# Patient Record
Sex: Male | Born: 1955
Health system: Southern US, Community
[De-identification: ages and names within clinical notes are randomized; demographics above are authoritative.]

## PROBLEM LIST (undated history)

## (undated) ENCOUNTER — Ambulatory Visit (HOSPITAL_COMMUNITY): Disposition: A | Discharge status: Home / Self Care | Payer: PPO

## (undated) DIAGNOSIS — E785 Hyperlipidemia, unspecified: Secondary | ICD-10-CM

## (undated) DIAGNOSIS — H269 Unspecified cataract: Secondary | ICD-10-CM

## (undated) DIAGNOSIS — I10 Essential (primary) hypertension: Secondary | ICD-10-CM

## (undated) DIAGNOSIS — I2510 Atherosclerotic heart disease of native coronary artery without angina pectoris: Secondary | ICD-10-CM

## (undated) DIAGNOSIS — I255 Ischemic cardiomyopathy: Secondary | ICD-10-CM

## (undated) DIAGNOSIS — E079 Disorder of thyroid, unspecified: Secondary | ICD-10-CM

## (undated) DIAGNOSIS — R0602 Shortness of breath: Secondary | ICD-10-CM

## (undated) DIAGNOSIS — I219 Acute myocardial infarction, unspecified: Secondary | ICD-10-CM

## (undated) DIAGNOSIS — M109 Gout, unspecified: Secondary | ICD-10-CM

## (undated) HISTORY — DX: Hyperlipidemia, unspecified: E78.5

## (undated) HISTORY — DX: Disorder of thyroid, unspecified: E07.9

## (undated) HISTORY — DX: Unspecified cataract: H26.9

## (undated) HISTORY — PX: EYE SURGERY: SHX253

---

## 2003-12-15 ENCOUNTER — Emergency Department (HOSPITAL_COMMUNITY): Admission: EM | Admit: 2003-12-15 | Discharge: 2003-12-15 | Payer: Self-pay | Admitting: Emergency Medicine

## 2005-04-06 ENCOUNTER — Ambulatory Visit: Payer: Self-pay | Admitting: Family Medicine

## 2005-07-10 ENCOUNTER — Ambulatory Visit: Payer: Self-pay | Admitting: Family Medicine

## 2005-11-30 ENCOUNTER — Ambulatory Visit: Payer: Self-pay | Admitting: Family Medicine

## 2012-03-31 ENCOUNTER — Emergency Department (HOSPITAL_COMMUNITY): Payer: Self-pay

## 2012-03-31 ENCOUNTER — Encounter (HOSPITAL_COMMUNITY): Payer: Self-pay

## 2012-03-31 ENCOUNTER — Emergency Department (HOSPITAL_COMMUNITY)
Admission: EM | Admit: 2012-03-31 | Discharge: 2012-03-31 | Disposition: A | Discharge status: Home / Self Care | Payer: Self-pay | Attending: Emergency Medicine | Admitting: Emergency Medicine

## 2012-03-31 DIAGNOSIS — S01309A Unspecified open wound of unspecified ear, initial encounter: Secondary | ICD-10-CM | POA: Insufficient documentation

## 2012-03-31 DIAGNOSIS — E119 Type 2 diabetes mellitus without complications: Secondary | ICD-10-CM | POA: Insufficient documentation

## 2012-03-31 DIAGNOSIS — M5030 Other cervical disc degeneration, unspecified cervical region: Secondary | ICD-10-CM | POA: Insufficient documentation

## 2012-03-31 DIAGNOSIS — R220 Localized swelling, mass and lump, head: Secondary | ICD-10-CM | POA: Insufficient documentation

## 2012-03-31 DIAGNOSIS — S301XXA Contusion of abdominal wall, initial encounter: Secondary | ICD-10-CM | POA: Insufficient documentation

## 2012-03-31 DIAGNOSIS — T07XXXA Unspecified multiple injuries, initial encounter: Secondary | ICD-10-CM | POA: Insufficient documentation

## 2012-03-31 DIAGNOSIS — R221 Localized swelling, mass and lump, neck: Secondary | ICD-10-CM | POA: Insufficient documentation

## 2012-03-31 HISTORY — DX: Essential (primary) hypertension: I10

## 2012-03-31 LAB — CBC
Hemoglobin: 14.5 g/dL (ref 13.0–17.0)
MCV: 89.3 fL (ref 78.0–100.0)
Platelets: 220 10*3/uL (ref 150–400)
RBC: 4.67 MIL/uL (ref 4.22–5.81)
WBC: 6 10*3/uL (ref 4.0–10.5)

## 2012-03-31 LAB — COMPREHENSIVE METABOLIC PANEL
Albumin: 3.9 g/dL (ref 3.5–5.2)
BUN: 8 mg/dL (ref 6–23)
Calcium: 8.4 mg/dL (ref 8.4–10.5)
Chloride: 102 mEq/L (ref 96–112)
Creatinine, Ser: 0.93 mg/dL (ref 0.50–1.35)
Total Bilirubin: 0.2 mg/dL — ABNORMAL LOW (ref 0.3–1.2)
Total Protein: 7.5 g/dL (ref 6.0–8.3)

## 2012-03-31 LAB — SAMPLE TO BLOOD BANK

## 2012-03-31 LAB — URINALYSIS, MICROSCOPIC ONLY
Bilirubin Urine: NEGATIVE
Glucose, UA: 250 mg/dL — AB
Leukocytes, UA: NEGATIVE
Nitrite: NEGATIVE
Specific Gravity, Urine: 1.011 (ref 1.005–1.030)
pH: 5 (ref 5.0–8.0)

## 2012-03-31 LAB — POCT I-STAT, CHEM 8
HCT: 45 % (ref 39.0–52.0)
Hemoglobin: 15.3 g/dL (ref 13.0–17.0)
Potassium: 3.5 mEq/L (ref 3.5–5.1)
Sodium: 141 mEq/L (ref 135–145)

## 2012-03-31 LAB — CDS SEROLOGY

## 2012-03-31 MED ORDER — IOHEXOL 300 MG/ML  SOLN
80.0000 mL | Freq: Once | INTRAMUSCULAR | Status: AC | PRN
  Administered 2012-03-31: 80 mL via INTRAVENOUS

## 2012-03-31 MED ORDER — MORPHINE SULFATE 4 MG/ML IJ SOLN
4.0000 mg | Freq: Once | INTRAMUSCULAR | Status: AC
  Administered 2012-03-31: 4 mg via INTRAVENOUS
  Filled 2012-03-31: qty 1

## 2012-03-31 MED ORDER — SODIUM CHLORIDE 0.9 % IV SOLN
Freq: Once | INTRAVENOUS | Status: AC
  Administered 2012-03-31: 13:00:00 via INTRAVENOUS

## 2012-03-31 MED ORDER — LIDOCAINE HCL 2 % IJ SOLN
INTRAMUSCULAR | Status: AC
  Filled 2012-03-31: qty 1

## 2012-03-31 MED ORDER — TETANUS-DIPHTH-ACELL PERTUSSIS 5-2.5-18.5 LF-MCG/0.5 IM SUSP
0.5000 mL | Freq: Once | INTRAMUSCULAR | Status: AC
  Administered 2012-03-31: 0.5 mL via INTRAMUSCULAR

## 2012-03-31 MED ORDER — TETANUS-DIPHTHERIA TOXOIDS TD 5-2 LFU IM INJ
0.5000 mL | INJECTION | Freq: Once | INTRAMUSCULAR | Status: DC
  Filled 2012-03-31: qty 0.5
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
## 2012-03-31 NOTE — ED Notes (Signed)
Pt sent home with wife

## 2012-03-31 NOTE — ED Notes (Signed)
Social Manufacturing systems engineer at bedside to talk with pt

## 2012-03-31 NOTE — Discharge Instructions (Signed)
Do not remove the bandage for 2 days. Go to the [facility] urgent care Center in 2 days to get your wound check. Sutures to come out in one week. Take Tylenol for pain as directed . Avoid alcohol. Return if your condition worsens for any reason
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
## 2012-03-31 NOTE — ED Notes (Signed)
CSI at bedside.

## 2013-01-09 ENCOUNTER — Encounter (HOSPITAL_COMMUNITY): Payer: Self-pay | Admitting: Emergency Medicine

## 2013-01-09 ENCOUNTER — Encounter (HOSPITAL_COMMUNITY)
Admission: EM | Disposition: A | Discharge status: Home Health | Payer: Self-pay | Source: Home / Self Care | Attending: Cardiology

## 2013-01-09 ENCOUNTER — Inpatient Hospital Stay (HOSPITAL_COMMUNITY)
Admission: EM | Admit: 2013-01-09 | Discharge: 2013-01-15 | DRG: 247 | Disposition: A | Discharge status: Home Health | Payer: Medicaid Other | Attending: Cardiology | Admitting: Cardiology

## 2013-01-09 DIAGNOSIS — F10929 Alcohol use, unspecified with intoxication, unspecified: Secondary | ICD-10-CM

## 2013-01-09 DIAGNOSIS — I219 Acute myocardial infarction, unspecified: Secondary | ICD-10-CM

## 2013-01-09 DIAGNOSIS — I2589 Other forms of chronic ischemic heart disease: Secondary | ICD-10-CM | POA: Diagnosis present

## 2013-01-09 DIAGNOSIS — F1010 Alcohol abuse, uncomplicated: Secondary | ICD-10-CM | POA: Diagnosis present

## 2013-01-09 DIAGNOSIS — I2119 ST elevation (STEMI) myocardial infarction involving other coronary artery of inferior wall: Secondary | ICD-10-CM

## 2013-01-09 DIAGNOSIS — Z79899 Other long term (current) drug therapy: Secondary | ICD-10-CM

## 2013-01-09 DIAGNOSIS — I498 Other specified cardiac arrhythmias: Secondary | ICD-10-CM | POA: Diagnosis present

## 2013-01-09 DIAGNOSIS — I213 ST elevation (STEMI) myocardial infarction of unspecified site: Secondary | ICD-10-CM

## 2013-01-09 DIAGNOSIS — I255 Ischemic cardiomyopathy: Secondary | ICD-10-CM

## 2013-01-09 DIAGNOSIS — Z955 Presence of coronary angioplasty implant and graft: Secondary | ICD-10-CM

## 2013-01-09 DIAGNOSIS — E119 Type 2 diabetes mellitus without complications: Secondary | ICD-10-CM | POA: Diagnosis present

## 2013-01-09 DIAGNOSIS — E876 Hypokalemia: Secondary | ICD-10-CM | POA: Diagnosis not present

## 2013-01-09 DIAGNOSIS — I2510 Atherosclerotic heart disease of native coronary artery without angina pectoris: Secondary | ICD-10-CM

## 2013-01-09 DIAGNOSIS — E1165 Type 2 diabetes mellitus with hyperglycemia: Secondary | ICD-10-CM

## 2013-01-09 DIAGNOSIS — R112 Nausea with vomiting, unspecified: Secondary | ICD-10-CM | POA: Diagnosis present

## 2013-01-09 DIAGNOSIS — I10 Essential (primary) hypertension: Secondary | ICD-10-CM | POA: Diagnosis present

## 2013-01-09 DIAGNOSIS — E1159 Type 2 diabetes mellitus with other circulatory complications: Secondary | ICD-10-CM | POA: Diagnosis present

## 2013-01-09 HISTORY — DX: Ischemic cardiomyopathy: I25.5

## 2013-01-09 HISTORY — DX: Atherosclerotic heart disease of native coronary artery without angina pectoris: I25.10

## 2013-01-09 HISTORY — DX: Acute myocardial infarction, unspecified: I21.9

## 2013-01-09 HISTORY — PX: LEFT HEART CATHETERIZATION WITH CORONARY ANGIOGRAM: SHX5451

## 2013-01-09 HISTORY — PX: CORONARY ANGIOPLASTY WITH STENT PLACEMENT: SHX49

## 2013-01-09 LAB — RAPID URINE DRUG SCREEN, HOSP PERFORMED
Cocaine: NOT DETECTED
Opiates: POSITIVE — AB

## 2013-01-09 LAB — TROPONIN I: Troponin I: >20 ng/mL (ref <0.30)

## 2013-01-09 LAB — CBC WITH DIFFERENTIAL/PLATELET
Basophils Absolute: 0 10*3/uL (ref 0.0–0.1)
Basophils Relative: 0 % (ref 0–1)
HCT: 35.6 % — ABNORMAL LOW (ref 39.0–52.0)
MCHC: 36 g/dL (ref 30.0–36.0)
Monocytes Absolute: 0.5 10*3/uL (ref 0.1–1.0)
Neutro Abs: 10.3 10*3/uL — ABNORMAL HIGH (ref 1.7–7.7)
RDW: 12 % (ref 11.5–15.5)

## 2013-01-09 LAB — MRSA PCR SCREENING: MRSA by PCR: NEGATIVE

## 2013-01-09 LAB — COMPREHENSIVE METABOLIC PANEL
ALT: 42 U/L (ref 0–53)
AST: 247 U/L — ABNORMAL HIGH (ref 0–37)
Alkaline Phosphatase: 88 U/L (ref 39–117)
CO2: 19 mEq/L (ref 19–32)
Calcium: 8.5 mg/dL (ref 8.4–10.5)
Chloride: 99 mEq/L (ref 96–112)
GFR calc Af Amer: >90 mL/min (ref >90)
GFR calc non Af Amer: >90 mL/min (ref >90)
Glucose, Bld: 351 mg/dL — ABNORMAL HIGH (ref 70–99)
Potassium: 3.9 mEq/L (ref 3.5–5.1)
Sodium: 134 mEq/L — ABNORMAL LOW (ref 135–145)
Total Bilirubin: 0.6 mg/dL (ref 0.3–1.2)

## 2013-01-09 LAB — POCT I-STAT, CHEM 8
Chloride: 103 mEq/L (ref 96–112)
HCT: 38 % — ABNORMAL LOW (ref 39.0–52.0)
Potassium: 3.8 mEq/L (ref 3.5–5.1)

## 2013-01-09 LAB — MAGNESIUM: Magnesium: 2 mg/dL (ref 1.5–2.5)

## 2013-01-09 LAB — APTT: aPTT: 50 seconds — ABNORMAL HIGH (ref 24–37)

## 2013-01-09 LAB — POCT ACTIVATED CLOTTING TIME: Activated Clotting Time: 306 seconds

## 2013-01-09 SURGERY — LEFT HEART CATHETERIZATION WITH CORONARY ANGIOGRAM
Anesthesia: LOCAL

## 2013-01-09 MED ORDER — BIVALIRUDIN 250 MG IV SOLR
INTRAVENOUS | Status: AC
  Filled 2013-01-09: qty 250

## 2013-01-09 MED ORDER — ACETAMINOPHEN 325 MG PO TABS: 650.0000 mg | ORAL_TABLET | ORAL | Status: DC | PRN

## 2013-01-09 MED ORDER — ONDANSETRON HCL 4 MG/2ML IJ SOLN
4.0000 mg | INTRAMUSCULAR | Status: DC | PRN
  Administered 2013-01-10: 4 mg via INTRAVENOUS
  Filled 2013-01-09: qty 2

## 2013-01-09 MED ORDER — DOPAMINE-DEXTROSE 3.2-5 MG/ML-% IV SOLN
INTRAVENOUS | Status: AC
  Administered 2013-01-09: 5 ug/kg/min via INTRAVENOUS
  Filled 2013-01-09: qty 250

## 2013-01-09 MED ORDER — SODIUM CHLORIDE 0.9 % IJ SOLN
3.0000 mL | Freq: Two times a day (BID) | INTRAMUSCULAR | Status: DC
  Administered 2013-01-09 – 2013-01-14 (×11): 3 mL via INTRAVENOUS

## 2013-01-09 MED ORDER — DEXTROSE-NACL 5-0.45 % IV SOLN: INTRAVENOUS | Status: DC

## 2013-01-09 MED ORDER — TICAGRELOR 90 MG PO TABS
90.0000 mg | ORAL_TABLET | Freq: Two times a day (BID) | ORAL | Status: DC
  Administered 2013-01-09: 90 mg via ORAL
  Filled 2013-01-09 (×2): qty 1

## 2013-01-09 MED ORDER — PANTOPRAZOLE SODIUM 40 MG IV SOLR
40.0000 mg | Freq: Once | INTRAVENOUS | Status: AC
  Administered 2013-01-09: 40 mg via INTRAVENOUS
  Filled 2013-01-09 (×2): qty 40

## 2013-01-09 MED ORDER — SODIUM CHLORIDE 0.9 % IV SOLN: INTRAVENOUS | Status: DC

## 2013-01-09 MED ORDER — ONDANSETRON HCL 4 MG/2ML IJ SOLN
4.0000 mg | Freq: Four times a day (QID) | INTRAMUSCULAR | Status: DC | PRN
  Administered 2013-01-09: 4 mg via INTRAVENOUS
  Filled 2013-01-09: qty 2

## 2013-01-09 MED ORDER — LORAZEPAM 1 MG PO TABS
1.0000 mg | ORAL_TABLET | Freq: Four times a day (QID) | ORAL | Status: AC | PRN
  Administered 2013-01-11: 1 mg via ORAL
  Filled 2013-01-09: qty 1

## 2013-01-09 MED ORDER — SODIUM CHLORIDE 0.9 % IV SOLN: 250.0000 mL | INTRAVENOUS | Status: DC | PRN

## 2013-01-09 MED ORDER — HEPARIN SODIUM (PORCINE) 5000 UNIT/ML IJ SOLN
5000.0000 [IU] | Freq: Three times a day (TID) | INTRAMUSCULAR | Status: DC
  Administered 2013-01-10 – 2013-01-15 (×15): 5000 [IU] via SUBCUTANEOUS
  Filled 2013-01-09 (×12): qty 1

## 2013-01-09 MED ORDER — INSULIN ASPART 100 UNIT/ML ~~LOC~~ SOLN: 0.0000 [IU] | Freq: Three times a day (TID) | SUBCUTANEOUS | Status: DC

## 2013-01-09 MED ORDER — ATORVASTATIN CALCIUM 80 MG PO TABS
80.0000 mg | ORAL_TABLET | Freq: Every day | ORAL | Status: DC
  Administered 2013-01-10 – 2013-01-14 (×5): 80 mg via ORAL
  Filled 2013-01-09 (×6): qty 1

## 2013-01-09 MED ORDER — HEPARIN (PORCINE) IN NACL 2-0.9 UNIT/ML-% IJ SOLN
INTRAMUSCULAR | Status: AC
  Filled 2013-01-09: qty 1000

## 2013-01-09 MED ORDER — INSULIN REGULAR BOLUS VIA INFUSION
0.0000 [IU] | Freq: Three times a day (TID) | INTRAVENOUS | Status: DC
  Filled 2013-01-09: qty 10

## 2013-01-09 MED ORDER — SODIUM CHLORIDE 0.9 % IV SOLN
INTRAVENOUS | Status: DC
  Administered 2013-01-10: 19:00:00 via INTRAVENOUS

## 2013-01-09 MED ORDER — ADULT MULTIVITAMIN W/MINERALS CH
1.0000 | ORAL_TABLET | Freq: Every day | ORAL | Status: DC
  Administered 2013-01-09 – 2013-01-13 (×5): 1 via ORAL
  Filled 2013-01-09 (×5): qty 1

## 2013-01-09 MED ORDER — VERAPAMIL HCL 2.5 MG/ML IV SOLN
INTRAVENOUS | Status: AC
  Filled 2013-01-09: qty 2

## 2013-01-09 MED ORDER — FOLIC ACID 1 MG PO TABS
1.0000 mg | ORAL_TABLET | Freq: Every day | ORAL | Status: DC
  Administered 2013-01-09 – 2013-01-13 (×5): 1 mg via ORAL
  Filled 2013-01-09 (×5): qty 1

## 2013-01-09 MED ORDER — LIDOCAINE HCL (PF) 1 % IJ SOLN
INTRAMUSCULAR | Status: AC
  Filled 2013-01-09: qty 30

## 2013-01-09 MED ORDER — MORPHINE SULFATE 4 MG/ML IJ SOLN
4.0000 mg | INTRAMUSCULAR | Status: DC | PRN
  Administered 2013-01-09 – 2013-01-10 (×2): 4 mg via INTRAVENOUS
  Administered 2013-01-11: 2 mg via INTRAVENOUS
  Filled 2013-01-09 (×4): qty 1

## 2013-01-09 MED ORDER — SODIUM CHLORIDE 0.9 % IV SOLN
INTRAVENOUS | Status: AC
  Administered 2013-01-09: 19:00:00 via INTRAVENOUS
  Administered 2013-01-10: 125 mL/h via INTRAVENOUS

## 2013-01-09 MED ORDER — SODIUM CHLORIDE 0.9 % IV BOLUS (SEPSIS)
500.0000 mL | Freq: Once | INTRAVENOUS | Status: AC
  Administered 2013-01-09: 500 mL via INTRAVENOUS

## 2013-01-09 MED ORDER — FENTANYL CITRATE 0.05 MG/ML IJ SOLN
INTRAMUSCULAR | Status: AC
  Filled 2013-01-09: qty 2

## 2013-01-09 MED ORDER — VITAMIN B-1 100 MG PO TABS
100.0000 mg | ORAL_TABLET | Freq: Every day | ORAL | Status: DC
  Administered 2013-01-09 – 2013-01-13 (×4): 100 mg via ORAL
  Filled 2013-01-09 (×5): qty 1

## 2013-01-09 MED ORDER — MIDAZOLAM HCL 2 MG/2ML IJ SOLN
INTRAMUSCULAR | Status: AC
  Filled 2013-01-09: qty 2

## 2013-01-09 MED ORDER — ASPIRIN EC 81 MG PO TBEC
81.0000 mg | DELAYED_RELEASE_TABLET | Freq: Every day | ORAL | Status: DC
  Administered 2013-01-10 – 2013-01-15 (×6): 81 mg via ORAL
  Filled 2013-01-09 (×6): qty 1

## 2013-01-09 MED ORDER — TICAGRELOR 90 MG PO TABS
ORAL_TABLET | ORAL | Status: AC
  Filled 2013-01-09: qty 2

## 2013-01-09 MED ORDER — NITROGLYCERIN 1 MG/10 ML FOR IR/CATH LAB
INTRA_ARTERIAL | Status: AC
  Filled 2013-01-09: qty 10

## 2013-01-09 MED ORDER — DOPAMINE-DEXTROSE 3.2-5 MG/ML-% IV SOLN
2.0000 ug/kg/min | INTRAVENOUS | Status: DC
  Administered 2013-01-11: 4 ug/kg/min via INTRAVENOUS
  Filled 2013-01-09: qty 250

## 2013-01-09 MED ORDER — LORAZEPAM 2 MG/ML IJ SOLN: 1.0000 mg | Freq: Four times a day (QID) | INTRAMUSCULAR | Status: AC | PRN

## 2013-01-09 MED ORDER — DEXTROSE 50 % IV SOLN: 25.0000 mL | INTRAVENOUS | Status: DC | PRN

## 2013-01-09 MED ORDER — NITROGLYCERIN 0.4 MG SL SUBL
0.4000 mg | SUBLINGUAL_TABLET | SUBLINGUAL | Status: DC | PRN
  Administered 2013-01-09: 0.4 mg via SUBLINGUAL
  Filled 2013-01-09: qty 50
  Filled 2013-01-09: qty 25

## 2013-01-09 MED ORDER — ZOLPIDEM TARTRATE 5 MG PO TABS: 5.0000 mg | ORAL_TABLET | Freq: Every evening | ORAL | Status: DC | PRN

## 2013-01-09 MED ORDER — INSULIN REGULAR HUMAN 100 UNIT/ML IJ SOLN
INTRAMUSCULAR | Status: DC
  Administered 2013-01-09: 3 [IU]/h via INTRAVENOUS
  Administered 2013-01-10: 1 [IU]/h via INTRAVENOUS
  Filled 2013-01-09: qty 1

## 2013-01-09 MED ORDER — SODIUM CHLORIDE 0.9 % IJ SOLN
3.0000 mL | INTRAMUSCULAR | Status: DC | PRN
  Administered 2013-01-12: 3 mL via INTRAVENOUS

## 2013-01-09 MED ORDER — ASPIRIN 81 MG PO CHEW: 324.0000 mg | CHEWABLE_TABLET | Freq: Once | ORAL | Status: DC

## 2013-01-09 MED ORDER — THIAMINE HCL 100 MG/ML IJ SOLN
100.0000 mg | Freq: Every day | INTRAMUSCULAR | Status: DC
  Administered 2013-01-10: 100 mg via INTRAVENOUS
  Filled 2013-01-09 (×5): qty 1

## 2013-01-09 NOTE — Progress Notes (Signed)
Pt remains 78/57 Dr [place] notified. Pt speaks little english but appears to have proper mentation. Order for dopamine received. Report given to [facility].

## 2013-01-09 NOTE — ED Notes (Signed)
Pt CP started last night radiating to left arm, neck, jaw, with n/v, dizziness that continued today. BIB EMS.

## 2013-01-09 NOTE — CV Procedure (Addendum)
Cardiac Catheterization Procedure Note  Name: Ethan Peters MRN: [id number] DOB: 1956-06-28  Procedure: Left Heart Cath, Selective Coronary Angiography, LV angiography, PTCA and stenting of the RCA  Indication: 56 year old Hispanic male with history of diabetes and hypertension presents with acute onset of chest pain beginning yesterday and it intensified today. Initial ECG via EMS was nondiagnostic. Repeat ECG in our emergency department showed 1 mm of ST segment elevation in leads 2, 3, and aVF consistent with an inferior ST elevation myocardial infarction. Emergent cardiac catheterization was recommended.  Procedural Details:  The right wrist was prepped, draped, and anesthetized with 1% lidocaine. Using the modified Seldinger technique, a 6 French sheath was introduced into the right radial artery. 3 mg of verapamil was administered through the sheath, weight-based unfractionated heparin was administered intravenously. Standard Judkins catheters were used for selective coronary angiography and left ventriculography. Catheter exchanges were performed over an exchange length guidewire.  PROCEDURAL FINDINGS Hemodynamics: AO 80/57 with a mean of 68 mmHg LV 80/22 mmHg   Coronary angiography: Coronary dominance: right  Left mainstem: Left main coronary is very short without significant disease.  Left anterior descending (LAD): The left anterior descending artery has a 30% stenosis in the proximal vessel. There is also 30% stenosis in the mid vessel. The first diagonal is without significant disease.  Left circumflex (LCx): Left circumflex gives rise to a single marginal branch. There is segmental 70% stenosis in the proximal marginal branch.  Right coronary artery (RCA): The right coronary is a dominant vessel. It is occluded at the crux. There are left to right collaterals to the distal RCA.  Left ventriculography: Left ventricular angiography performed at the end of the procedure  demonstrates moderate to severe hypokinesis of the inferior basal wall. Overall ejection fraction is estimated at 50% there is no significant mitral insufficiency.  PCI Note:  Following the diagnostic procedure, the decision was made to proceed with PCI of the RCA. Brilinta 180 mg was given orally. Weight-based bivalirudin was given for anticoagulation. Once a therapeutic ACT was achieved, a 6 [place] FR4 guide catheter was inserted.  A pro-water coronary guidewire was used to cross the lesion.  The lesion was predilated with a 2.5 mm balloon. With this the vessel reperfused. There was a long thrombus noted in the distal RCA. We used an expressway extraction catheter and removed a long thrombus. By angiography there was no residual thrombus in the vessel. The lesion was then stented with a 2.5 x 16 mm Promus Premier stent.  The stent was postdilated with a 2.75 mm noncompliant balloon.  Following PCI, there was 0% residual stenosis and TIMI-3 flow. Final angiography confirmed an excellent result. The patient tolerated the procedure well. There were no immediate procedural complications. A TR band was used for radial hemostasis. The patient was transferred to the post catheterization recovery area for further monitoring.  PCI Data: Vessel - RCA/Segment - crux Percent Stenosis (pre)  100% TIMI-flow 0 Stent 2.5 x 16 mm Promus Premier Percent Stenosis (post) 0% TIMI-flow (post) 3  Final Conclusions:   1. 2 vessel obstructive coronary disease. The right coronary was the culprit vessel and was occluded. There is moderate disease in the first obtuse marginal vessel. 2. Mild left ventricular dysfunction. 3. Successful stenting of the crux of the right coronary with a drug-eluting stent.   Recommendations:  Continue dual antiplatelet therapy for one year. Risk factor modification. I would treat the marginal disease medically.  [name] [facility] 01/09/2013, 6:42 PM

## 2013-01-09 NOTE — ED Provider Notes (Signed)
History     CSN: [id number]  Arrival date & time 01/09/13  1714   First MD Initiated Contact with Patient 01/09/13 1721      Chief Complaint  Patient presents with  . Chest Pain    (Consider location/radiation/quality/duration/timing/severity/associated sxs/prior treatment) Patient is a 57 y.o. male presenting with chest pain. The history is provided by the patient.  Chest Pain Pain location:  Substernal area Pain quality: dull   Pain severity:  Moderate (5) Onset quality:  Gradual Timing:  Constant Progression:  Unchanged Chronicity:  New Context comment:  Started last night and has persisted Relieved by:  Nothing   Past Medical History  Diagnosis Date  . Hypertension   . Diabetes mellitus     No past surgical history on file.  No family history on file.  History  Substance Use Topics  . Smoking status: Not on file  . Smokeless tobacco: Not on file  . Alcohol Use: Yes     Comment: pt intoxicated at time of arrival. EMS reports patient has been drinking for the past 2 days.       Review of Systems  Unable to perform ROS: Acuity of condition  Cardiovascular: Positive for chest pain.    Allergies  Penicillins  Home Medications   Current Outpatient Rx  Name  Route  Sig  Dispense  Refill  . glyBURIDE (DIABETA) 5 MG tablet   Oral   Take 5 mg by mouth daily with breakfast.         . lisinopril-hydrochlorothiazide (PRINZIDE,ZESTORETIC) 10-12.5 MG per tablet   Oral   Take 1 tablet by mouth daily.         . metFORMIN (GLUCOPHAGE-XR) 500 MG 24 hr tablet   Oral   Take 1,000 mg by mouth daily with breakfast.           BP 101/56  Temp(Src) 98.3 F (36.8 C) (Oral)  Resp 17  SpO2 100%  Physical Exam  Nursing note and vitals reviewed. Constitutional: He is oriented to person, place, and time. He appears well-developed and well-nourished. No distress.  HENT:  Head: Normocephalic and atraumatic.  Mouth/Throat: Oropharynx is clear and moist.   Eyes: Conjunctivae are normal. Pupils are equal, round, and reactive to light. No scleral icterus.  Neck: Normal range of motion. Neck supple.  Cardiovascular: Normal rate, regular rhythm, normal heart sounds and intact distal pulses.   No murmur heard. Pulmonary/Chest: Effort normal and breath sounds normal. No stridor. No respiratory distress. He has no wheezes. He has no rales.  Abdominal: Soft. He exhibits no distension. There is no tenderness. There is no rebound and no guarding.  Musculoskeletal: Normal range of motion. He exhibits no edema.  Neurological: He is alert and oriented to person, place, and time.  Skin: Skin is warm and dry. No rash noted.  Psychiatric: He has a normal mood and affect. His behavior is normal.    ED Course  Procedures (including critical care time)  Labs Reviewed  APTT  CBC  COMPREHENSIVE METABOLIC PANEL  PROTIME-INR   No results found.   Date: 01/09/2013  Rate: 59  Rhythm: sinus bradycardia  QRS Axis: normal  Intervals: normal  ST/T Wave abnormalities: ST elevations inferiorly and ST elevations laterally  Conduction Disutrbances:none  Narrative Interpretation:   Old EKG Reviewed: none available   1. STEMI (ST elevation myocardial infarction)       MDM   57 yo male presenting with chest pain which started last night and has  worsened today.  EMS called code STEMI PTA.  EKG from EMS felt to be equivocal by STEMI cardiologist.  However, initial ED EKG concerning for inferior STEMI.  Cardiologist reviewed this EKG as well and agreed that he needed to be taken emergently to the cath lab.        Rennis Petty, MD 01/10/13 [phone]

## 2013-01-09 NOTE — Progress Notes (Signed)
CRITICAL VALUE ALERT  Critical value received: Troponin > 20.0   Date of notification:  01/09/2013  Time of notification:  2243  Critical value read back:yes  Nurse who received alert:  [id number]   MD notified (1st page):  Dr. Orvis Brill   Time of first page:  2245  MD notified (2nd page):  Time of second page:  Responding MD: Dr Orvis Brill Time MD responded:2320

## 2013-01-09 NOTE — H&P (Signed)
History and Physical   Patient ID: Ethan Peters MRN: [id number], DOB/AGE: [birth year]   Admit date: 01/09/2013 Date of Consult: 01/09/2013   Primary Physician: Default, Provider, MD Primary Cardiologist: New  HPI: Ethan Peters is a 57 y.o. Spanish-speaking male with minimal known PMHx including DM2 and HTN who presented to [facility] ED today complaining of chest pain.   He described his discomfort as substernal, dull, constant at 5/10 occurring last night and persisting today. The pain persisted and EMS was called. Reviewing the ED notes, apparently was intoxicated on arrival and had been drinking for the past two days. The EKG on initial review was non-diagnostic for STEMI. Repeat EKG in the ED did reveal significant inferior ST elevations consistent with STEMI. Code STEMI was activated, and the patient was transported emergently to the cath lab.  Patient does speak some [place].   Problem List: Past Medical History  Diagnosis Date  . Hypertension   . Diabetes mellitus    Social History - Limited by language barrier and acuity of situation  Family History - Limited by language barrier and acuity of situation  Allergies:  Allergies  Allergen Reactions  . Penicillins Rash    Home Medications: Prior to Admission medications   Medication Sig Start Date End Date Taking? Authorizing Provider  glyBURIDE (DIABETA) 5 MG tablet Take 5 mg by mouth daily with breakfast.    Historical Provider, MD  lisinopril-hydrochlorothiazide (PRINZIDE,ZESTORETIC) 10-12.5 MG per tablet Take 1 tablet by mouth daily.    Historical Provider, MD  metFORMIN (GLUCOPHAGE-XR) 500 MG 24 hr tablet Take 1,000 mg by mouth daily with breakfast.    Historical Provider, MD    Inpatient Medications:  . [facility] HOLD] aspirin  324 mg Oral Once   Prescriptions prior to admission  Medication Sig Dispense Refill  . glyBURIDE (DIABETA) 5 MG tablet Take 5 mg by mouth daily with breakfast.      .  lisinopril-hydrochlorothiazide (PRINZIDE,ZESTORETIC) 10-12.5 MG per tablet Take 1 tablet by mouth daily.      . metFORMIN (GLUCOPHAGE-XR) 500 MG 24 hr tablet Take 1,000 mg by mouth daily with breakfast.        History   Social History  . Marital Status: Married    Spouse Name: N/A    Number of Children: N/A  . Years of Education: N/A   Occupational History  . Not on file.   Social History Main Topics  . Smoking status: Not on file  . Smokeless tobacco: Not on file  . Alcohol Use: Yes     Comment: pt intoxicated at time of arrival. EMS reports patient has been drinking for the past 2 days.   . Drug Use:   . Sexually Active:    Other Topics Concern  . Not on file   Social History Narrative  . No narrative on file     Review of Systems:  Full ROS limited by language barrier and acuity of situation.   Cardiovascular:  positive for chest pain  Physical Exam: Blood pressure 101/56, temperature 98.3 F (36.8 C), temperature source Oral, resp. rate 17, SpO2 100.00%.  General: Well developed, well nourished, in no acute distress. Head: Normocephalic, atraumatic, sclera non-icteric, no xanthomas, nares are without discharge.  Neck: Negative for carotid bruits. JVD not elevated. Lungs: Clear bilaterally to auscultation without wheezes, rales, or rhonchi. Breathing is unlabored. Heart:  RRR with S1 S2. No murmurs, rubs, or gallops appreciated. Abdomen: Soft, non-tender, non-distended with normoactive bowel sounds. No  hepatomegaly. No rebound/guarding. No obvious abdominal masses. Msk:  Strength and tone appears normal for age. Extremities: No clubbing, cyanosis or edema.  Distal pedal pulses are 2+ and equal bilaterally. Neuro: Alert and oriented X 3. Moves all extremities spontaneously. Psych:  Responds to questions appropriately with a normal affect.  Labs:  Pending  Radiology/Studies: No results found.  EKG: NSR/sinus bradycardia, 59 bpm, ST elevations II, III, aVF (>2  mm) with small Qs  ASSESSMENT AND PLAN:   57 y.o. Spanish-speaking male with minimal known PMHx including DM2 and HTN who presented to [facility] ED today complaining of chest pain.   1. Inferior STEMI 2. Type 2 DM 3. Hypertension 4. EtOH excess  Currently undergoing emergent cardiac catheterization. Will need at least low-dose ASA. Will start high-dose atorvastatin after discussing with MD. Check EtOH, UDS, LFTs. Place on CIWA. BP a bit labile in the ED. Will defer on BB and OP antihypertensives initially- requiring IV boluses on the cath table. Switch oral hypoglycemics for SSI. Cycle CEs, check BMET, CBC, TSH, Mg, A1C, lipids, CXR. Will be transferred to CCU post-cath. Further recommendations to be determined by the interventionalist's findings.    Signed, R. Hurman Horn, PA-C 01/09/2013, 6:13 PM   Patient seen and examined and history reviewed. Agree with above findings and plan. 57 yo hispanic male presents with chest pain that began yesterday but intensified today. Associated with nausea and vomiting. Pain radiated to back and left arm. On arrival pain was 5/10. Exam is benign. Initial Ecg by EMS was nondiagnostic. Repeat Ecg in our ED shows 1 mm ST elevation in leads 2.3.avf consistent with inferior STEMI. Emergent cardiac cath recommended. Patient denies recent alcohol abuse to me. Denies tobacco use.  [name] [facility] 01/09/2013 6:54 PM

## 2013-01-09 NOTE — ED Provider Notes (Signed)
57 year old male was brought in by EMS as a code STEMI. He apparently had been having chest pain since last night associated with nausea and vomiting. He EMS had called a code STEMI. On arrival in the emergency department, the code STEMI cardiologist that the ECG from EMS and canceled code STEMI. Patient is still having ongoing pain. On exam, lungs are clear heart has regular rate rhythm. He has no peripheral edema. ECG obtained in the ED shows inferior wall MI with some extension to the anterolateral wall. Code STEMI cardiologist was called to review the new ECG and agreed and is taking him directly to the catheterization lab.   Date: 01/09/2013  Rate: 59  Rhythm: sinus bradycardia  QRS Axis: normal  Intervals: normal  ST/T Wave abnormalities: acute myocardial infarction and ST elevations in leads 2, 3, aVF, and V6  Conduction Disutrbances:none  Narrative Interpretation:  Acute inferior and anterolateral wall myocardial infarction. No prior ECG available for comparison.  Old EKG Reviewed: none available  CRITICAL CARE Performed by: [email or web address]   Total critical care time: 35 minutes  Critical care time was exclusive of separately billable procedures and treating other patients.  Critical care was necessary to treat or prevent imminent or life-threatening deterioration.  Critical care was time spent personally by me on the following activities: development of treatment plan with patient and/or surrogate as well as nursing, discussions with consultants, evaluation of patient's response to treatment, examination of patient, obtaining history from patient or surrogate, ordering and performing treatments and interventions, ordering and review of laboratory studies, ordering and review of radiographic studies, pulse oximetry and re-evaluation of patient's condition.  I saw and evaluated the patient, reviewed the resident's note and I agree with the findings and plan.   Dione Booze, MD 01/09/13  1739

## 2013-01-10 DIAGNOSIS — I219 Acute myocardial infarction, unspecified: Secondary | ICD-10-CM

## 2013-01-10 LAB — CBC
Hemoglobin: 12.6 g/dL — ABNORMAL LOW (ref 13.0–17.0)
MCHC: 36.1 g/dL — ABNORMAL HIGH (ref 30.0–36.0)
RDW: 12.1 % (ref 11.5–15.5)

## 2013-01-10 LAB — HEMOGLOBIN A1C
Hgb A1c MFr Bld: 14.5 % — ABNORMAL HIGH (ref <5.7)
Hgb A1c MFr Bld: 14.8 % — ABNORMAL HIGH (ref <5.7)
Mean Plasma Glucose: 369 mg/dL — ABNORMAL HIGH (ref <117)

## 2013-01-10 LAB — BASIC METABOLIC PANEL
GFR calc Af Amer: >90 mL/min (ref >90)
GFR calc non Af Amer: >90 mL/min (ref >90)
Glucose, Bld: 145 mg/dL — ABNORMAL HIGH (ref 70–99)
Potassium: 3.9 mEq/L (ref 3.5–5.1)
Sodium: 137 mEq/L (ref 135–145)

## 2013-01-10 LAB — GLUCOSE, CAPILLARY
Glucose-Capillary: 264 mg/dL — ABNORMAL HIGH (ref 70–99)
Glucose-Capillary: 355 mg/dL — ABNORMAL HIGH (ref 70–99)
Glucose-Capillary: 128 mg/dL — ABNORMAL HIGH (ref 70–99)
Glucose-Capillary: 183 mg/dL — ABNORMAL HIGH (ref 70–99)

## 2013-01-10 LAB — TROPONIN I: Troponin I: >20 ng/mL (ref <0.30)

## 2013-01-10 LAB — LIPID PANEL
Cholesterol: 158 mg/dL (ref 0–200)
Triglycerides: 94 mg/dL (ref <150)
VLDL: 19 mg/dL (ref 0–40)

## 2013-01-10 LAB — TSH: TSH: 0.928 u[IU]/mL (ref 0.350–4.500)

## 2013-01-10 MED ORDER — INSULIN ASPART 100 UNIT/ML ~~LOC~~ SOLN
0.0000 [IU] | Freq: Three times a day (TID) | SUBCUTANEOUS | Status: DC
  Administered 2013-01-10: 5 [IU] via SUBCUTANEOUS
  Administered 2013-01-11: 11 [IU] via SUBCUTANEOUS
  Administered 2013-01-11: 8 [IU] via SUBCUTANEOUS
  Administered 2013-01-11 – 2013-01-12 (×3): 5 [IU] via SUBCUTANEOUS
  Administered 2013-01-12: 15 [IU] via SUBCUTANEOUS
  Administered 2013-01-13 (×2): 8 [IU] via SUBCUTANEOUS
  Administered 2013-01-13: 2 [IU] via SUBCUTANEOUS
  Administered 2013-01-14: 5 [IU] via SUBCUTANEOUS
  Administered 2013-01-15: 3 [IU] via SUBCUTANEOUS

## 2013-01-10 MED ORDER — INSULIN ASPART 100 UNIT/ML ~~LOC~~ SOLN: 0.0000 [IU] | SUBCUTANEOUS | Status: DC

## 2013-01-10 MED ORDER — SODIUM CHLORIDE 0.9 % IV SOLN
INTRAVENOUS | Status: AC
  Administered 2013-01-10: 125 mL/h via INTRAVENOUS
  Administered 2013-01-10: 11:00:00 via INTRAVENOUS

## 2013-01-10 MED ORDER — TICAGRELOR 90 MG PO TABS
90.0000 mg | ORAL_TABLET | Freq: Two times a day (BID) | ORAL | Status: DC
  Administered 2013-01-10 – 2013-01-15 (×11): 90 mg via ORAL
  Filled 2013-01-10 (×12): qty 1

## 2013-01-10 MED ORDER — INSULIN ASPART 100 UNIT/ML ~~LOC~~ SOLN
0.0000 [IU] | Freq: Every day | SUBCUTANEOUS | Status: DC
  Administered 2013-01-12: 4 [IU] via SUBCUTANEOUS
  Administered 2013-01-14: 2 [IU] via SUBCUTANEOUS

## 2013-01-10 MED ORDER — INSULIN ASPART 100 UNIT/ML ~~LOC~~ SOLN: 0.0000 [IU] | Freq: Every day | SUBCUTANEOUS | Status: DC

## 2013-01-10 MED ORDER — SODIUM CHLORIDE 0.9 % IV BOLUS (SEPSIS)
500.0000 mL | Freq: Once | INTRAVENOUS | Status: AC
  Administered 2013-01-10: 500 mL via INTRAVENOUS

## 2013-01-10 MED ORDER — INSULIN ASPART 100 UNIT/ML ~~LOC~~ SOLN
0.0000 [IU] | Freq: Three times a day (TID) | SUBCUTANEOUS | Status: DC
  Administered 2013-01-10: 3 [IU] via SUBCUTANEOUS

## 2013-01-10 NOTE — Progress Notes (Signed)
At 1000 pt c/o 5/10 substernal chest pain. Dr Antoine Poche notified, stat EKG done and 4mg  morphine given per order. EKG results showed to Dr Antoine Poche, no changes noted. Instructions received to continue to treat chest pain with morphine unless other symptoms appear. Pt stating "chest pain is a little better now." Pt remains on dopamine gtt. Monitoring closely.

## 2013-01-10 NOTE — Progress Notes (Signed)
*  P Echocardiogram 2D Echocardiogram has been performed.  Georgian Co 01/10/2013, 9:20 AM

## 2013-01-10 NOTE — Progress Notes (Signed)
Assumed care for this 7a-7p shift. Pt resting in bed, dopamine gtt as documented for low BP. Pt states that he had " chest pain on and off throughout the night" but that pain is "better now, and only hurst a little bit." Remains on iv insulin. Denies nausea at this time. Awaiting rounding MD for updates. Will closely monitor for increase in CP or discomfort. Call light within reach, reviewed importance of notifying staff when needing assistance or if CP increases.

## 2013-01-10 NOTE — Progress Notes (Signed)
SUBJECTIVE:  Nauseated through the night.  Still with residual chest pain with a pleuritic component   PHYSICAL EXAM Filed Vitals:   01/10/13 0700 01/10/13 0800 01/10/13 0815 01/10/13 0830  BP: 99/71 85/57 87/62  98/75  Pulse: 66 59  61  Temp:  98.5 F (36.9 C)    TempSrc:  Oral    Resp: 12 18  17   Height:      Weight:      SpO2: 99% 98%  98%   General:  No acute distress Lungs:  Clear Heart:  RRR, no rub Abdomen:  Positive bowel sounds, no rebound no guarding Extremities:  No edema, right wrist without bruising or bleeding Neuro:  Nonfocal  LABS: Lab Results  Component Value Date   TROPONINI >20.00* 01/10/2013   Results for orders placed during the hospital encounter of 01/09/13 (from the past 24 hour(s))  POCT I-STAT, CHEM 8     Status: Abnormal   Collection Time    01/09/13  5:56 PM      Result Value Range   Sodium 137  135 - 145 mEq/L   Potassium 3.8  3.5 - 5.1 mEq/L   Chloride 103  96 - 112 mEq/L   BUN 11  6 - 23 mg/dL   Creatinine, Ser 9.14  0.50 - 1.35 mg/dL   Glucose, Bld 782 (*) 70 - 99 mg/dL   Calcium, Ion 9.56  2.13 - 1.23 mmol/L   TCO2 23  0 - 100 mmol/L   Hemoglobin 12.9 (*) 13.0 - 17.0 g/dL   HCT 08.6 (*) 57.8 - 46.9 %  POCT ACTIVATED CLOTTING TIME     Status: None   Collection Time    01/09/13  6:05 PM      Result Value Range   Activated Clotting Time 306    MRSA PCR SCREENING     Status: None   Collection Time    01/09/13  7:15 PM      Result Value Range   MRSA by PCR NEGATIVE  NEGATIVE  APTT     Status: Abnormal   Collection Time    01/09/13  8:39 PM      Result Value Range   aPTT 50 (*) 24 - 37 seconds  COMPREHENSIVE METABOLIC PANEL     Status: Abnormal   Collection Time    01/09/13  8:39 PM      Result Value Range   Sodium 134 (*) 135 - 145 mEq/L   Potassium 3.9  3.5 - 5.1 mEq/L   Chloride 99  96 - 112 mEq/L   CO2 19  19 - 32 mEq/L   Glucose, Bld 351 (*) 70 - 99 mg/dL   BUN 12  6 - 23 mg/dL   Creatinine, Ser 6.29  0.50 - 1.35  mg/dL   Calcium 8.5  8.4 - 52.8 mg/dL   Total Protein 6.9  6.0 - 8.3 g/dL   Albumin 3.7  3.5 - 5.2 g/dL   AST 413 (*) 0 - 37 U/L   ALT 42  0 - 53 U/L   Alkaline Phosphatase 88  39 - 117 U/L   Total Bilirubin 0.6  0.3 - 1.2 mg/dL   GFR calc non Af Amer >90  >90 mL/min   GFR calc Af Amer >90  >90 mL/min  PROTIME-INR     Status: Abnormal   Collection Time    01/09/13  8:39 PM      Result Value Range   Prothrombin Time 17.2 (*)  11.6 - 15.2 seconds   INR 1.44  0.00 - 1.49  TROPONIN I     Status: Abnormal   Collection Time    01/09/13  8:39 PM      Result Value Range   Troponin I >20.00 (*) <0.30 ng/mL  TSH     Status: None   Collection Time    01/09/13  8:39 PM      Result Value Range   TSH 0.928  0.350 - 4.500 uIU/mL  MAGNESIUM     Status: None   Collection Time    01/09/13  8:39 PM      Result Value Range   Magnesium 2.0  1.5 - 2.5 mg/dL  HEMOGLOBIN Z6X     Status: Abnormal   Collection Time    01/09/13  8:39 PM      Result Value Range   Hemoglobin A1C 14.5 (*) <5.7 %   Mean Plasma Glucose 369 (*) <117 mg/dL  CBC WITH DIFFERENTIAL     Status: Abnormal   Collection Time    01/09/13  8:39 PM      Result Value Range   WBC 12.4 (*) 4.0 - 10.5 K/uL   RBC 4.13 (*) 4.22 - 5.81 MIL/uL   Hemoglobin 12.8 (*) 13.0 - 17.0 g/dL   HCT 09.6 (*) 04.5 - 40.9 %   MCV 86.2  78.0 - 100.0 fL   MCH 31.0  26.0 - 34.0 pg   MCHC 36.0  30.0 - 36.0 g/dL   RDW 81.1  91.4 - 78.2 %   Platelets 191  150 - 400 K/uL   Neutrophils Relative 83 (*) 43 - 77 %   Neutro Abs 10.3 (*) 1.7 - 7.7 K/uL   Lymphocytes Relative 13  12 - 46 %   Lymphs Abs 1.6  0.7 - 4.0 K/uL   Monocytes Relative 4  3 - 12 %   Monocytes Absolute 0.5  0.1 - 1.0 K/uL   Eosinophils Relative 0  0 - 5 %   Eosinophils Absolute 0.0  0.0 - 0.7 K/uL   Basophils Relative 0  0 - 1 %   Basophils Absolute 0.0  0.0 - 0.1 K/uL  GLUCOSE, CAPILLARY     Status: Abnormal   Collection Time    01/09/13  9:36 PM      Result Value Range    Glucose-Capillary 355 (*) 70 - 99 mg/dL  GLUCOSE, CAPILLARY     Status: Abnormal   Collection Time    01/09/13 10:30 PM      Result Value Range   Glucose-Capillary 364 (*) 70 - 99 mg/dL  URINE RAPID DRUG SCREEN (HOSP PERFORMED)     Status: Abnormal   Collection Time    01/09/13 10:51 PM      Result Value Range   Opiates POSITIVE (*) NONE DETECTED   Cocaine NONE DETECTED  NONE DETECTED   Benzodiazepines POSITIVE (*) NONE DETECTED   Amphetamines NONE DETECTED  NONE DETECTED   Tetrahydrocannabinol NONE DETECTED  NONE DETECTED   Barbiturates NONE DETECTED  NONE DETECTED  GLUCOSE, CAPILLARY     Status: Abnormal   Collection Time    01/09/13 11:31 PM      Result Value Range   Glucose-Capillary 318 (*) 70 - 99 mg/dL  GLUCOSE, CAPILLARY     Status: Abnormal   Collection Time    01/10/13 12:28 AM      Result Value Range   Glucose-Capillary 264 (*) 70 - 99 mg/dL  TROPONIN  I     Status: Abnormal   Collection Time    01/10/13 12:55 AM      Result Value Range   Troponin I >20.00 (*) <0.30 ng/mL  GLUCOSE, CAPILLARY     Status: Abnormal   Collection Time    01/10/13  1:28 AM      Result Value Range   Glucose-Capillary 224 (*) 70 - 99 mg/dL  GLUCOSE, CAPILLARY     Status: Abnormal   Collection Time    01/10/13  2:28 AM      Result Value Range   Glucose-Capillary 164 (*) 70 - 99 mg/dL  GLUCOSE, CAPILLARY     Status: None   Collection Time    01/10/13  3:40 AM      Result Value Range   Glucose-Capillary 97  70 - 99 mg/dL  GLUCOSE, CAPILLARY     Status: Abnormal   Collection Time    01/10/13  4:44 AM      Result Value Range   Glucose-Capillary 108 (*) 70 - 99 mg/dL  CBC     Status: Abnormal   Collection Time    01/10/13  5:00 AM      Result Value Range   WBC 11.9 (*) 4.0 - 10.5 K/uL   RBC 4.06 (*) 4.22 - 5.81 MIL/uL   Hemoglobin 12.6 (*) 13.0 - 17.0 g/dL   HCT 16.1 (*) 09.6 - 04.5 %   MCV 86.0  78.0 - 100.0 fL   MCH 31.0  26.0 - 34.0 pg   MCHC 36.1 (*) 30.0 - 36.0 g/dL    RDW 40.9  81.1 - 91.4 %   Platelets 200  150 - 400 K/uL  BASIC METABOLIC PANEL     Status: Abnormal   Collection Time    01/10/13  5:00 AM      Result Value Range   Sodium 137  135 - 145 mEq/L   Potassium 3.9  3.5 - 5.1 mEq/L   Chloride 105  96 - 112 mEq/L   CO2 20  19 - 32 mEq/L   Glucose, Bld 145 (*) 70 - 99 mg/dL   BUN 12  6 - 23 mg/dL   Creatinine, Ser 7.82  0.50 - 1.35 mg/dL   Calcium 8.2 (*) 8.4 - 10.5 mg/dL   GFR calc non Af Amer >90  >90 mL/min   GFR calc Af Amer >90  >90 mL/min  LIPID PANEL     Status: None   Collection Time    01/10/13  5:00 AM      Result Value Range   Cholesterol 158  0 - 200 mg/dL   Triglycerides 94  <956 mg/dL   HDL 52  >21 mg/dL   Total CHOL/HDL Ratio 3.0     VLDL 19  0 - 40 mg/dL   LDL Cholesterol 87  0 - 99 mg/dL  GLUCOSE, CAPILLARY     Status: Abnormal   Collection Time    01/10/13  5:47 AM      Result Value Range   Glucose-Capillary 128 (*) 70 - 99 mg/dL  GLUCOSE, CAPILLARY     Status: Abnormal   Collection Time    01/10/13  8:00 AM      Result Value Range   Glucose-Capillary 183 (*) 70 - 99 mg/dL    Intake/Output Summary (Last 24 hours) at 01/10/13 0843 Last data filed at 01/10/13 0800  Gross per 24 hour  Intake 2782.17 ml  Output   1350 ml  Net  1432.17 ml    EKG:   NSR, inferior ST elevation unchanged form yesterday with the evolution of T wave inversion consistent with recent inferior MI.  01/10/2013  ASSESSMENT AND PLAN:  ST elevation myocardial infarction (STEMI) of inferior wall:  S/P DES to RCA.    BP is low and requiring dopamine.  I will give another 500 cc NS bolus to try to wean down the dopamine drip.  Echo pending this AM.  Continue ASA and Brilinta.    Poorly controlled type 2 diabetes mellitus:  Start SSI  HTN (hypertension):   BP low as above.     Fayrene Fearing [facility] 01/10/2013 8:43 AM

## 2013-01-10 NOTE — Progress Notes (Signed)
Upon assessing pt, pt c/o midsternal cp with pain score = 4 and nausea. 1 sl ntg given with no relief. On call MD notified. New order rec for morphine 4mg . Morphine 4mg  given with good relief. Ekg done, looked worse than previous (post cath EKG). On call MD repaged, stated to call fellow to come and see pt. Dr. Orvis Brill notified. New orders rec. Continuing to titrate dopamine. Pt also given zofran for bouts of nausea and vomiting.   Md notified re high CBG, new orders rec for insulin gtt. Insulin gtt order verified and glucostabilizer initiated and witnessed by Seneca Sink, Charity fundraiser.   Will cont to monitor closely.

## 2013-01-11 LAB — CBC
HCT: 36.9 % — ABNORMAL LOW (ref 39.0–52.0)
MCV: 89.3 fL (ref 78.0–100.0)
RBC: 4.13 MIL/uL — ABNORMAL LOW (ref 4.22–5.81)
RDW: 11.9 % (ref 11.5–15.5)
WBC: 9.9 10*3/uL (ref 4.0–10.5)

## 2013-01-11 LAB — BASIC METABOLIC PANEL
BUN: 11 mg/dL (ref 6–23)
CO2: 20 mEq/L (ref 19–32)
Chloride: 100 mEq/L (ref 96–112)
Creatinine, Ser: 0.87 mg/dL (ref 0.50–1.35)
Glucose, Bld: 365 mg/dL — ABNORMAL HIGH (ref 70–99)

## 2013-01-11 LAB — GLUCOSE, CAPILLARY
Glucose-Capillary: 285 mg/dL — ABNORMAL HIGH (ref 70–99)
Glucose-Capillary: 200 mg/dL — ABNORMAL HIGH (ref 70–99)

## 2013-01-11 MED ORDER — SODIUM CHLORIDE 0.9 % IV BOLUS (SEPSIS)
500.0000 mL | Freq: Once | INTRAVENOUS | Status: AC
  Administered 2013-01-11: 500 mL via INTRAVENOUS

## 2013-01-11 NOTE — Progress Notes (Signed)
SUBJECTIVE:  Nausea improved.  Mild discomfort upper chest. No SOB.   PHYSICAL EXAM Filed Vitals:   01/11/13 0500 01/11/13 0600 01/11/13 0700 01/11/13 0800  BP: 111/72 134/80 104/61 118/98  Pulse: 61 62 62 62  Temp:    98.2 F (36.8 C)  TempSrc:    Oral  Resp: 15 15 17 14   Height:      Weight: 170 lb 13.7 oz (77.5 kg)     SpO2: 96% 95% 97% 98%   General:  No acute distress Lungs:  Clear Heart:  RRR, no rub Abdomen:  Positive bowel sounds, no rebound no guarding Extremities:  No edema, right wrist without bruising or bleeding   LABS: Lab Results  Component Value Date   TROPONINI >20.00* 01/10/2013   Results for orders placed during the hospital encounter of 01/09/13 (from the past 24 hour(s))  HEMOGLOBIN A1C     Status: Abnormal   Collection Time    01/10/13  9:13 AM      Result Value Range   Hemoglobin A1C 14.8 (*) <5.7 %   Mean Plasma Glucose 378 (*) <117 mg/dL  GLUCOSE, CAPILLARY     Status: Abnormal   Collection Time    01/10/13 11:47 AM      Result Value Range   Glucose-Capillary 215 (*) 70 - 99 mg/dL  GLUCOSE, CAPILLARY     Status: Abnormal   Collection Time    01/10/13  4:07 PM      Result Value Range   Glucose-Capillary 263 (*) 70 - 99 mg/dL  GLUCOSE, CAPILLARY     Status: Abnormal   Collection Time    01/10/13  9:26 PM      Result Value Range   Glucose-Capillary 179 (*) 70 - 99 mg/dL  CBC     Status: Abnormal   Collection Time    01/11/13  6:30 AM      Result Value Range   WBC 9.9  4.0 - 10.5 K/uL   RBC 4.13 (*) 4.22 - 5.81 MIL/uL   Hemoglobin 12.9 (*) 13.0 - 17.0 g/dL   HCT 96.0 (*) 45.4 - 09.8 %   MCV 89.3  78.0 - 100.0 fL   MCH 31.2  26.0 - 34.0 pg   MCHC 35.0  30.0 - 36.0 g/dL   RDW 11.9  14.7 - 82.9 %   Platelets 187  150 - 400 K/uL  BASIC METABOLIC PANEL     Status: Abnormal   Collection Time    01/11/13  6:30 AM      Result Value Range   Sodium 134 (*) 135 - 145 mEq/L   Potassium 3.9  3.5 - 5.1 mEq/L   Chloride 100  96 - 112 mEq/L     CO2 20  19 - 32 mEq/L   Glucose, Bld 365 (*) 70 - 99 mg/dL   BUN 11  6 - 23 mg/dL   Creatinine, Ser 5.62  0.50 - 1.35 mg/dL   Calcium 8.1 (*) 8.4 - 10.5 mg/dL   GFR calc non Af Amer >90  >90 mL/min   GFR calc Af Amer >90  >90 mL/min  GLUCOSE, CAPILLARY     Status: Abnormal   Collection Time    01/11/13  7:50 AM      Result Value Range   Glucose-Capillary 334 (*) 70 - 99 mg/dL    Intake/Output Summary (Last 24 hours) at 01/11/13 0823 Last data filed at 01/11/13 0800  Gross per 24 hour  Intake 2383.78  ml  Output   1530 ml  Net 853.78 ml    EKG:   NSR, inferior ST elevation slightly less pronounced than yesterday with the evolution of T wave inversion consistent with recent inferior MI.  01/11/2013  ASSESSMENT AND PLAN:  ST elevation myocardial infarction (STEMI) of inferior wall:  S/P DES to RCA.    Dopamine is still on.  Continue to wean today.   Continue ASA and Brilinta.  Still holding ACE or beta blocker with low BPs .  EF is mildly reduced on echo yesterday.    Poorly controlled type 2 diabetes mellitus:  Start SSI   Rollene Rotunda 01/11/2013 8:23 AM

## 2013-01-12 ENCOUNTER — Inpatient Hospital Stay (HOSPITAL_COMMUNITY): Payer: Medicaid Other

## 2013-01-12 ENCOUNTER — Encounter (HOSPITAL_COMMUNITY): Payer: Self-pay

## 2013-01-12 LAB — CBC
HCT: 34.6 % — ABNORMAL LOW (ref 39.0–52.0)
Hemoglobin: 12.4 g/dL — ABNORMAL LOW (ref 13.0–17.0)
RBC: 4.01 MIL/uL — ABNORMAL LOW (ref 4.22–5.81)
WBC: 8.4 10*3/uL (ref 4.0–10.5)

## 2013-01-12 LAB — BASIC METABOLIC PANEL
BUN: 12 mg/dL (ref 6–23)
Chloride: 103 mEq/L (ref 96–112)
Glucose, Bld: 282 mg/dL — ABNORMAL HIGH (ref 70–99)
Potassium: 3.9 mEq/L (ref 3.5–5.1)

## 2013-01-12 MED ORDER — SODIUM CHLORIDE 0.9 % IV BOLUS (SEPSIS)
500.0000 mL | Freq: Once | INTRAVENOUS | Status: AC
  Administered 2013-01-12: 500 mL via INTRAVENOUS

## 2013-01-12 MED ORDER — GLYBURIDE 5 MG PO TABS
5.0000 mg | ORAL_TABLET | Freq: Every day | ORAL | Status: DC
  Administered 2013-01-13 – 2013-01-14 (×2): 5 mg via ORAL
  Filled 2013-01-12 (×3): qty 1

## 2013-01-12 MED ORDER — COLCHICINE 0.6 MG PO TABS
0.6000 mg | ORAL_TABLET | Freq: Two times a day (BID) | ORAL | Status: DC
  Administered 2013-01-12 – 2013-01-15 (×6): 0.6 mg via ORAL
  Filled 2013-01-12 (×7): qty 1

## 2013-01-12 MED ORDER — HYDROCODONE-ACETAMINOPHEN 5-325 MG PO TABS
1.0000 | ORAL_TABLET | ORAL | Status: DC | PRN
  Administered 2013-01-12 – 2013-01-13 (×2): 1 via ORAL
  Filled 2013-01-12 (×2): qty 1

## 2013-01-12 MED FILL — Dextrose Inj 5%: INTRAVENOUS | Qty: 50 | Status: AC

## 2013-01-12 NOTE — Care Management Note (Addendum)
    Page 1 of 2   01/15/2013     10:27:23 AM   CARE MANAGEMENT NOTE 01/15/2013  Patient:  Ethan Peters, Ethan Peters   Account Number:  [id number]  Date Initiated:  01/12/2013  Documentation initiated by:  Junius Creamer  Subjective/Objective Assessment:   adm w mi     Action/Plan:   lives w wife   Anticipated DC Date:     Anticipated DC Plan:        DC Associate Professor  CM consult  Medication Assistance  MATCH Program      Choice offered to / List presented to:  [name]        HH arranged  HH-1 RN  HH-10 DISEASE MANAGEMENT      HH agency  [facility]   Status of service:  Completed, signed off Medicare Important Message given?   (If response is "NO", the following Medicare IM given date fields will be blank) Date Medicare IM given:   Date Additional Medicare IM given:    Discharge Disposition:  HOME W HOME HEALTH SERVICES  Per UR Regulation:  Reviewed for med. necessity/level of care/duration of stay  If discussed at Long Length of Stay Meetings, dates discussed:    Comments:  01-15-13 1024 Ethan Peters, [place] [phone] Plan for d/c today home with [facility] services via [facility]. Pt will get medications vis MATCH throught the outpatient pharmacy. No further needs from CM at this time.   01-14-13 1154 Ethan Peters, [place] [phone] Pt is eligible for the Match Program- he will be able to pick up his medications at the [facility]. CM will provide pt with the 30 day free card for brilinta and will use the Match Program with the other Rx's. Pt will need to fax brilinta assistance forms to the co. CM will ask pt if he would like for an appointment to be made at [facility] for f/u. CM did call Ethan Peters at the Outpatient Pharmacy to explain what CM was doing. CM did call and make a f/u appointment for pt 01-16-13 at 2:30. Will use the Match program.  CM did call [facility] to see if they can do charity case for pt and liaison will see  if he qualifies. No  further assistance needed at this time.  3/31 1228 Ethan dowell rn,bsn gave pt brilinta 30day free card and copay assist card in spanish. left inform [facility]. left 2 prescription cards that may help w brand name meds. left pt assist form for brilinta in shadow chart for md to sign.

## 2013-01-12 NOTE — Progress Notes (Signed)
SUBJECTIVE:  Complains of chest pain only when he takes a deep breath. Otherwise feels well. Minor nosebleed.   PHYSICAL EXAM Filed Vitals:   01/12/13 0700 01/12/13 0715 01/12/13 0800 01/12/13 0900  BP: 131/79 151/87 110/69   Pulse: 65 56 60 70  Temp:   98.7 F (37.1 C)   TempSrc:   Oral   Resp:      Height:      Weight:      SpO2: 96% 97% 94% 95%   General:  No acute distress Lungs:  Clear Heart:  RRR, no rub Abdomen:  Positive bowel sounds, no rebound no guarding Extremities:  No edema, right wrist without bruising or bleeding Neuro:  Nonfocal  LABS: Lab Results  Component Value Date   TROPONINI >20.00* 01/10/2013   Results for orders placed during the hospital encounter of 01/09/13 (from the past 24 hour(s))  GLUCOSE, CAPILLARY     Status: Abnormal   Collection Time    01/11/13 11:39 AM      Result Value Range   Glucose-Capillary 285 (*) 70 - 99 mg/dL  GLUCOSE, CAPILLARY     Status: Abnormal   Collection Time    01/11/13  5:01 PM      Result Value Range   Glucose-Capillary 229 (*) 70 - 99 mg/dL  GLUCOSE, CAPILLARY     Status: Abnormal   Collection Time    01/11/13  9:30 PM      Result Value Range   Glucose-Capillary 200 (*) 70 - 99 mg/dL  CBC     Status: Abnormal   Collection Time    01/12/13  5:30 AM      Result Value Range   WBC 8.4  4.0 - 10.5 K/uL   RBC 4.01 (*) 4.22 - 5.81 MIL/uL   Hemoglobin 12.4 (*) 13.0 - 17.0 g/dL   HCT 57.8 (*) 46.9 - 62.9 %   MCV 86.3  78.0 - 100.0 fL   MCH 30.9  26.0 - 34.0 pg   MCHC 35.8  30.0 - 36.0 g/dL   RDW 52.8  41.3 - 24.4 %   Platelets 168  150 - 400 K/uL  BASIC METABOLIC PANEL     Status: Abnormal   Collection Time    01/12/13  5:30 AM      Result Value Range   Sodium 137  135 - 145 mEq/L   Potassium 3.9  3.5 - 5.1 mEq/L   Chloride 103  96 - 112 mEq/L   CO2 26  19 - 32 mEq/L   Glucose, Bld 282 (*) 70 - 99 mg/dL   BUN 12  6 - 23 mg/dL   Creatinine, Ser 0.10  0.50 - 1.35 mg/dL   Calcium 8.1 (*) 8.4 - 10.5  mg/dL   GFR calc non Af Amer >90  >90 mL/min   GFR calc Af Amer >90  >90 mL/min    Intake/Output Summary (Last 24 hours) at 01/12/13 0916 Last data filed at 01/12/13 0700  Gross per 24 hour  Intake 1138.2 ml  Output   3125 ml  Net -1986.8 ml    EKG:   NSR, inferior ST elevation unchanged form yesterday with the evolution of T wave inversion consistent with recent inferior MI.  01/12/2013  Transthoracic Echocardiography  Patient: Ethan Peters, Ethan Peters MR #: [id number] Study Date: 01/10/2013 Gender: M Age: 57 Height: 167.6cm Weight: 74.4kg BSA: 1.105m^2 Pt. Status: Room: MCCL  ORDERING [place], Malerie Eakins PERFORMING [facility] ATTENDING Dione Booze SONOGRAPHER [place],  RDCS, CCT cc:  ------------------------------------------------------------ LV EF: 45% - 50%  ------------------------------------------------------------ Indications: MI - acute 410.91.  ------------------------------------------------------------ History: Risk factors: Hypertension. Diabetes mellitus.  ------------------------------------------------------------ Study Conclusions  - Left ventricle: The cavity size was normal. Wall thickness was normal. Systolic function was mildly reduced. The estimated ejection fraction was in the range of 45% to 50%. There is hypokinesis to [place] the basal-mid inferior myocardium. Doppler parameters are consistent with abnormal left ventricular relaxation (grade 1 diastolic dysfunction). - Mitral valve: Trivial regurgitation. - Right ventricle: The cavity size was mildly dilated. Systolic function was low normal. - Right atrium: The atrium was at the upper limits of normal in size. Central venous pressure: 10mm Hg (est). - Tricuspid valve: Mild regurgitation. - Pulmonary arteries: PA peak pressure: 25mm Hg (S). - Pericardium, extracardiac: There was no pericardial effusion. Transthoracic echocardiography. M-mode, complete 2D, spectral Doppler, and  color Doppler. Height: Height: 167.6cm. Height: 66in. Weight: Weight: 74.4kg. Weight: 163.7lb. Body mass index: BMI: 26.5kg/m^2. Body surface area: BSA: 1.11m^2. Blood pressure: 98/75. Patient status: Inpatient. Location: ICU/CCU  ------------------------------------------------------------  ------------------------------------------------------------ Left ventricle: The cavity size was normal. Wall thickness was normal. Systolic function was mildly reduced. The estimated ejection fraction was in the range of 45% to 50%. Regional wall motion abnormalities: There is hypokinesis to akinesis of the basal-mid inferior myocardium. Doppler parameters are consistent with abnormal left ventricular relaxation (grade 1 diastolic dysfunction).  ------------------------------------------------------------ Aortic valve: Trileaflet. Cusp separation was normal. Doppler: No significant regurgitation.  ------------------------------------------------------------ Aorta: Aortic root: The aortic root was normal in size.  ------------------------------------------------------------ Mitral valve: The valve appears to be grossly normal. Doppler: Trivial regurgitation. Peak gradient: 2mm Hg (D).  ------------------------------------------------------------ Left atrium: The atrium was normal in size.  ------------------------------------------------------------ Right ventricle: The cavity size was mildly dilated. Systolic function was low normal.  ------------------------------------------------------------ Pulmonic valve: The valve appears to be grossly normal. Doppler: Physiologic regurgitation.  ------------------------------------------------------------ Tricuspid valve: The valve appears to be grossly normal. Doppler: Mild regurgitation.  ------------------------------------------------------------ Right atrium: The atrium was at the upper limits of normal in  size.  ------------------------------------------------------------ Pericardium: There was no pericardial effusion.  ------------------------------------------------------------ Systemic veins: Inferior vena cava: The vessel was normal in size; the respirophasic diameter changes were blunted (< 50%).  ------------------------------------------------------------  2D measurements Normal Doppler Normal Left ventricle measurements LVID ED, 42.8 mm 43-52 Main pulmonary chord, artery PLAX Pressure, S 25 mm =30 LVID ES, 29.8 mm 23-38 Hg chord, Left ventricle PLAX Ea, lat 6.47 cm/ ------- FS, chord, 30 % >29 ann, tiss s PLAX DP LVPW, ED 10.6 mm ------ E/Ea, lat 11.19 ------- IVS/LVPW 0.95 <1.3 ann, tiss ratio, ED DP Vol ED, 89 ml ------ Ea, med 5.48 cm/ ------- MOD1 ann, tiss s Vol ES, 50 ml ------ DP MOD1 E/Ea, med 13.21 ------- EF, MOD1 44 % ------ ann, tiss Vol index, 48 ml/m^2 ------ DP ED, MOD1 Mitral valve Vol index, 27 ml/m^2 ------ Peak E vel 72.4 cm/ ------- ES, MOD1 s Vol ED, 90 ml ------ Peak A vel 88.8 cm/ ------- MOD2 s Vol ES, 48 ml ------ Deceleratio 275 ms 150-230 MOD2 n time EF, MOD2 47 % ------ Peak 2 mm ------- Stroke 42 ml ------ gradient, D Hg vol, MOD2 Peak E/A 0.8 ------- Vol index, 49 ml/m^2 ------ ratio ED, MOD2 Tricuspid valve Vol index, 26 ml/m^2 ------ Regurg peak 195 cm/ ------- ES, MOD2 vel s Stroke 22.8 ml/m^2 ------ Peak RV-RA 15 mm ------- index, gradient, S Hg MOD2 Systemic veins Ventricular septum Estimated 10 mm ------- IVS,  ED 10.1 mm ------ CVP Hg Aorta Right ventricle Root diam, 30 mm ------ Pressure, S 25 mm <30 ED Hg Left atrium Sa vel, lat 10.5 cm/ ------- AP dim 34 mm ------ ann, tiss s AP dim 1.85 cm/m^2 <2.2 DP index Vol, S 38 ml ------ Vol index, 20.7 ml/m^2 ------ S Right ventricle RVID ED, 31.1 mm 19-38 PLAX  ------------------------------------------------------------ Prepared and Electronically Authenticated  by  Nona Dell 2014-03-29T13:31:12.477   ASSESSMENT AND PLAN:  1. ST elevation myocardial infarction (STEMI) of inferior wall:  S/P DES to RCA.    BP is low and requiring dopamine.  I will give another 500 cc NS bolus to try to wean down the dopamine drip. Continue ASA and Brilinta.  Not a candidate for ACEi or beta blocker due to hypotension.  Poorly controlled type 2 diabetes mellitus:  Start SSI. Resume diabeta  HTN (hypertension):   BP low as above.     [name] [facility] 01/12/2013 9:16 AM

## 2013-01-12 NOTE — Progress Notes (Signed)
Inpatient Diabetes Program Recommendations  AACE/ADA: New Consensus Statement on Inpatient Glycemic Control (2013)  Target Ranges:  Prepandial:   less than 140 mg/dL      Peak postprandial:   less than 180 mg/dL (1-2 hours)      Critically ill patients:  140 - 180 mg/dL  Results for MAXIME, BECKNER (MRN [id number]) as of 01/12/2013 15:05  Ref. Range 01/11/2013 11:39 01/11/2013 17:01 01/11/2013 21:30 01/12/2013 08:32 01/12/2013 12:10  Glucose-Capillary Latest Range: 70-99 mg/dL 782 (H) 956 (H) 213 (H) 363 (H) 240 (H)   Inpatient Diabetes Program Recommendations Insulin - Basal: Start basal insulin Levemir 15 units  Diet: add Carbohydrate modified to diet A1C=14.8 Thank you  Piedad Climes BSN, RN,CDE Inpatient Diabetes Coordinator [phone] (team pager)

## 2013-01-12 NOTE — Plan of Care (Signed)
Problem: Phase I Progression Outcomes Goal: Hemodynamically stable Outcome: Not Progressing Pt continues on dopamine.  Unable to wean.

## 2013-01-12 NOTE — Clinical Social Work Note (Signed)
CSW received a consult for information on "Obamacare".  CSW contacted Morrie Sheldon, Artist who stated she had already received a referral for this pt.  She stated that Tobi Bastos, the Spanish speaking financial counselor has this pt on her list to be assessed later this morning.  CSW is signing off.  No other CSW needs identified at this time.  Vickii Penna, LCSWA [phone]  Clinical Social Work

## 2013-01-12 NOTE — Progress Notes (Addendum)
CARDIAC REHAB PHASE I   PRE:  Rate/Rhythm: 70 SR  BP:  Supine: 105/64  Sitting: 124/74  Standing:    SaO2: 97 RA  MODE:  Ambulation: 190 ft   POST:  Rate/Rhythm: 92 SR  BP:  Supine:   Sitting: 105/64  Standing:    SaO2: 98 RA 1425-1500 Assisted X 2 to ambulate. Pt c/o of right knee pain with walking, states that he has had gout before and it feels like that. He was able to walk with a limp. Pt states that pain in chest with deep breathing. Pt was able to walk 190 feet states walking that his chest felt ok, no pain. VS stable. Pt to recliner after walk with call light in reach.Started MI education with pt. Gave him MI and stent booklets.  [name] RN 01/12/2013 2:53 PM

## 2013-01-13 DIAGNOSIS — E119 Type 2 diabetes mellitus without complications: Secondary | ICD-10-CM

## 2013-01-13 DIAGNOSIS — I10 Essential (primary) hypertension: Secondary | ICD-10-CM

## 2013-01-13 LAB — BASIC METABOLIC PANEL
BUN: 10 mg/dL (ref 6–23)
CO2: 27 mEq/L (ref 19–32)
Chloride: 101 mEq/L (ref 96–112)
Creatinine, Ser: 0.9 mg/dL (ref 0.50–1.35)
Glucose, Bld: 256 mg/dL — ABNORMAL HIGH (ref 70–99)

## 2013-01-13 LAB — GLUCOSE, CAPILLARY
Glucose-Capillary: 138 mg/dL — ABNORMAL HIGH (ref 70–99)
Glucose-Capillary: 194 mg/dL — ABNORMAL HIGH (ref 70–99)

## 2013-01-13 LAB — CBC
HCT: 31.7 % — ABNORMAL LOW (ref 39.0–52.0)
MCV: 85.7 fL (ref 78.0–100.0)
RBC: 3.7 MIL/uL — ABNORMAL LOW (ref 4.22–5.81)
WBC: 7.1 10*3/uL (ref 4.0–10.5)

## 2013-01-13 MED ORDER — INSULIN DETEMIR 100 UNIT/ML ~~LOC~~ SOLN
15.0000 [IU] | Freq: Every day | SUBCUTANEOUS | Status: DC
  Administered 2013-01-13 – 2013-01-14 (×2): 15 [IU] via SUBCUTANEOUS
  Filled 2013-01-13 (×2): qty 0.15

## 2013-01-13 MED ORDER — POTASSIUM CHLORIDE CRYS ER 20 MEQ PO TBCR
20.0000 meq | EXTENDED_RELEASE_TABLET | Freq: Once | ORAL | Status: AC
  Administered 2013-01-13: 20 meq via ORAL
  Filled 2013-01-13: qty 1

## 2013-01-13 NOTE — Progress Notes (Signed)
CARDIAC REHAB PHASE I   PRE:  Rate/Rhythm: 72 SR    BP: sitting 118/64    SaO2: 97 2L  MODE:  Ambulation: 350 ft   POST:  Rate/Rhythm: 95 SR    BP: sitting 97/59, 103/61     SaO2: 92 RA  Pt c/o blurred vision in bed and walking today. Also c/o being weak in general. Able to walk without major problems. Right Knee still hurts. Sts he is SOB in bed, like he can't get his breath. Feels good walking.  Reluctant to go to recliner due to general fatigue. Continued to discuss MI, DM. Started MI video. Pt did not know what a carbohydrate is. Will need extensive education. Pt sts he understands english and requested an english diet sheet. Will f/u, encouraged more walking. [id number]  Elissa Lovett [place] CES, ACSM 01/13/2013 10:25 AM

## 2013-01-13 NOTE — Progress Notes (Signed)
SUBJECTIVE:  Complains of mild blurred vision. Nosebleed is decreased. Had some SOB last night now resolved. No angina.   PHYSICAL EXAM Filed Vitals:   01/13/13 0700 01/13/13 0800 01/13/13 0900 01/13/13 1000  BP: 97/59 107/64 108/74 103/61  Pulse: 94 84 73 76  Temp:  98.6 F (37 C)    TempSrc:  Oral    Resp:      Height:      Weight:      SpO2: 91% 95% 96% 91%   General:  No acute distress Lungs:  Clear Heart:  RRR, no rub Abdomen:  Positive bowel sounds, no rebound no guarding Extremities:  No edema, right wrist without bruising or bleeding Neuro:  Nonfocal  LABS: Lab Results  Component Value Date   TROPONINI >20.00* 01/10/2013   Results for orders placed during the hospital encounter of 01/09/13 (from the past 24 hour(s))  GLUCOSE, CAPILLARY     Status: Abnormal   Collection Time    01/12/13 12:10 PM      Result Value Range   Glucose-Capillary 240 (*) 70 - 99 mg/dL  GLUCOSE, CAPILLARY     Status: Abnormal   Collection Time    01/12/13  4:15 PM      Result Value Range   Glucose-Capillary 211 (*) 70 - 99 mg/dL   Comment 1 Documented in Chart     Comment 2 Notify RN    GLUCOSE, CAPILLARY     Status: Abnormal   Collection Time    01/12/13 10:08 PM      Result Value Range   Glucose-Capillary 310 (*) 70 - 99 mg/dL  CBC     Status: Abnormal   Collection Time    01/13/13  4:15 AM      Result Value Range   WBC 7.1  4.0 - 10.5 K/uL   RBC 3.70 (*) 4.22 - 5.81 MIL/uL   Hemoglobin 11.3 (*) 13.0 - 17.0 g/dL   HCT 16.1 (*) 09.6 - 04.5 %   MCV 85.7  78.0 - 100.0 fL   MCH 30.5  26.0 - 34.0 pg   MCHC 35.6  30.0 - 36.0 g/dL   RDW 40.9  81.1 - 91.4 %   Platelets 196  150 - 400 K/uL  BASIC METABOLIC PANEL     Status: Abnormal   Collection Time    01/13/13  4:15 AM      Result Value Range   Sodium 136  135 - 145 mEq/L   Potassium 3.4 (*) 3.5 - 5.1 mEq/L   Chloride 101  96 - 112 mEq/L   CO2 27  19 - 32 mEq/L   Glucose, Bld 256 (*) 70 - 99 mg/dL   BUN 10  6 - 23 mg/dL    Creatinine, Ser 7.82  0.50 - 1.35 mg/dL   Calcium 8.1 (*) 8.4 - 10.5 mg/dL   GFR calc non Af Amer >90  >90 mL/min   GFR calc Af Amer >90  >90 mL/min  GLUCOSE, CAPILLARY     Status: Abnormal   Collection Time    01/13/13  7:39 AM      Result Value Range   Glucose-Capillary 277 (*) 70 - 99 mg/dL    Intake/Output Summary (Last 24 hours) at 01/13/13 1047 Last data filed at 01/13/13 1000  Gross per 24 hour  Intake 873.87 ml  Output   2650 ml  Net -1776.13 ml    EKG:   NSR, inferior ST elevation unchanged form yesterday with  the evolution of T wave inversion consistent with recent inferior MI.  01/13/2013  Transthoracic Echocardiography  Patient: Ethan Peters, Ethan Peters MR #: [id number] Study Date: 01/10/2013 Gender: M Age: 57 Height: 167.6cm Weight: 74.4kg BSA: 1.45m^2 Pt. Status: Room: MCCL  ORDERING [place], B[place]ton Vanscyoc PERFORMING [place], [facility] ATTENDING Dione Booze SONOGRAPHER [place], RDCS, CCT cc:  ------------------------------------------------------------ LV EF: 45% - 50%  ------------------------------------------------------------ Indications: MI - acute 410.91.  ------------------------------------------------------------ History: Risk factors: Hypertension. Diabetes mellitus.  ------------------------------------------------------------ Study Conclusions  - Left ventricle: The cavity size was normal. Wall thickness was normal. Systolic function was mildly reduced. The estimated ejection fraction was in the range of 45% to 50%. There is hypokinesis to [place] the basal-mid inferior myocardium. Doppler parameters are consistent with abnormal left ventricular relaxation (grade 1 diastolic dysfunction). - Mitral valve: Trivial regurgitation. - Right ventricle: The cavity size was mildly dilated. Systolic function was low normal. - Right atrium: The atrium was at the upper limits of normal in size. Central venous pressure: 10mm Hg (est). - Tricuspid  valve: Mild regurgitation. - Pulmonary arteries: PA peak pressure: 25mm Hg (S). - Pericardium, extracardiac: There was no pericardial effusion. Transthoracic echocardiography. M-mode, complete 2D, spectral Doppler, and color Doppler. Height: Height: 167.6cm. Height: 66in. Weight: Weight: 74.4kg. Weight: 163.7lb. Body mass index: BMI: 26.5kg/m^2. Body surface area: BSA: 1.58m^2. Blood pressure: 98/75. Patient status: Inpatient. Location: ICU/CCU  ------------------------------------------------------------  ------------------------------------------------------------ Left ventricle: The cavity size was normal. Wall thickness was normal. Systolic function was mildly reduced. The estimated ejection fraction was in the range of 45% to 50%. Regional wall motion abnormalities: There is hypokinesis to akinesis of the basal-mid inferior myocardium. Doppler parameters are consistent with abnormal left ventricular relaxation (grade 1 diastolic dysfunction).  ------------------------------------------------------------ Aortic valve: Trileaflet. Cusp separation was normal. Doppler: No significant regurgitation.  ------------------------------------------------------------ Aorta: Aortic root: The aortic root was normal in size.  ------------------------------------------------------------ Mitral valve: The valve appears to be grossly normal. Doppler: Trivial regurgitation. Peak gradient: 2mm Hg (D).  ------------------------------------------------------------ Left atrium: The atrium was normal in size.  ------------------------------------------------------------ Right ventricle: The cavity size was mildly dilated. Systolic function was low normal.  ------------------------------------------------------------ Pulmonic valve: The valve appears to be grossly normal. Doppler: Physiologic regurgitation.  ------------------------------------------------------------ Tricuspid valve: The  valve appears to be grossly normal. Doppler: Mild regurgitation.  ------------------------------------------------------------ Right atrium: The atrium was at the upper limits of normal in size.  ------------------------------------------------------------ Pericardium: There was no pericardial effusion.  ------------------------------------------------------------ Systemic veins: Inferior vena cava: The vessel was normal in size; the respirophasic diameter changes were blunted (< 50%).  ------------------------------------------------------------  2D measurements Normal Doppler Normal Left ventricle measurements LVID ED, 42.8 mm 43-52 Main pulmonary chord, artery PLAX Pressure, S 25 mm =30 LVID ES, 29.8 mm 23-38 Hg chord, Left ventricle PLAX Ea, lat 6.47 cm/ ------- FS, chord, 30 % >29 ann, tiss s PLAX DP LVPW, ED 10.6 mm ------ E/Ea, lat 11.19 ------- IVS/LVPW 0.95 <1.3 ann, tiss ratio, ED DP Vol ED, 89 ml ------ Ea, med 5.48 cm/ ------- MOD1 ann, tiss s Vol ES, 50 ml ------ DP MOD1 E/Ea, med 13.21 ------- EF, MOD1 44 % ------ ann, tiss Vol index, 48 ml/m^2 ------ DP ED, MOD1 Mitral valve Vol index, 27 ml/m^2 ------ Peak E vel 72.4 cm/ ------- ES, MOD1 s Vol ED, 90 ml ------ Peak A vel 88.8 cm/ ------- MOD2 s Vol ES, 48 ml ------ Deceleratio 275 ms 150-230 MOD2 n time EF, MOD2 47 % ------ Peak 2 mm ------- Stroke 42 ml ------ gradient, D  Hg vol, MOD2 Peak E/A 0.8 ------- Vol index, 49 ml/m^2 ------ ratio ED, MOD2 Tricuspid valve Vol index, 26 ml/m^2 ------ Regurg peak 195 cm/ ------- ES, MOD2 vel s Stroke 22.8 ml/m^2 ------ Peak RV-RA 15 mm ------- index, gradient, S Hg MOD2 Systemic veins Ventricular septum Estimated 10 mm ------- IVS, ED 10.1 mm ------ CVP Hg Aorta Right ventricle Root diam, 30 mm ------ Pressure, S 25 mm <30 ED Hg Left atrium Sa vel, lat 10.5 cm/ ------- AP dim 34 mm ------ ann, tiss s AP dim 1.85 cm/m^2 <2.2 DP index Vol, S 38 ml  ------ Vol index, 20.7 ml/m^2 ------ S Right ventricle RVID ED, 31.1 mm 19-38 PLAX  ------------------------------------------------------------ Prepared and Electronically Authenticated by  Nona Dell 2014-03-29T13:31:12.477   ASSESSMENT AND PLAN:  1. ST elevation myocardial infarction (STEMI) of inferior wall:  S/P DES to RCA.    Now off dopamine and BP is stable.. Continue ASA and Brilinta.  Not a candidate for ACEi or beta blocker now due to hypotension. Will transfer to floor today and ambulate.  2. Poorly controlled type 2 diabetes mellitus: A1C 14.  On SSI and diabeta. Will add Levemir 15 units daily. Appreciate input from diabetes coordinator.  3. HTN (hypertension):   BP low as above.     [name] [facility] 01/13/2013 10:47 AM

## 2013-01-14 ENCOUNTER — Telehealth: Payer: Self-pay | Admitting: Cardiology

## 2013-01-14 ENCOUNTER — Encounter (HOSPITAL_COMMUNITY): Payer: Self-pay | Admitting: Physician Assistant

## 2013-01-14 DIAGNOSIS — I255 Ischemic cardiomyopathy: Secondary | ICD-10-CM

## 2013-01-14 DIAGNOSIS — I2510 Atherosclerotic heart disease of native coronary artery without angina pectoris: Secondary | ICD-10-CM

## 2013-01-14 DIAGNOSIS — F10929 Alcohol use, unspecified with intoxication, unspecified: Secondary | ICD-10-CM

## 2013-01-14 DIAGNOSIS — E876 Hypokalemia: Secondary | ICD-10-CM

## 2013-01-14 LAB — BASIC METABOLIC PANEL
CO2: 27 mEq/L (ref 19–32)
GFR calc non Af Amer: >90 mL/min (ref >90)
Glucose, Bld: 119 mg/dL — ABNORMAL HIGH (ref 70–99)
Potassium: 3.4 mEq/L — ABNORMAL LOW (ref 3.5–5.1)
Sodium: 138 mEq/L (ref 135–145)

## 2013-01-14 LAB — GLUCOSE, CAPILLARY
Glucose-Capillary: 101 mg/dL — ABNORMAL HIGH (ref 70–99)
Glucose-Capillary: 218 mg/dL — ABNORMAL HIGH (ref 70–99)
Glucose-Capillary: 82 mg/dL (ref 70–99)
Glucose-Capillary: 243 mg/dL — ABNORMAL HIGH (ref 70–99)
Glucose-Capillary: 221 mg/dL — ABNORMAL HIGH (ref 70–99)

## 2013-01-14 LAB — CBC
Hemoglobin: 11.2 g/dL — ABNORMAL LOW (ref 13.0–17.0)
MCHC: 36.4 g/dL — ABNORMAL HIGH (ref 30.0–36.0)
RBC: 3.6 MIL/uL — ABNORMAL LOW (ref 4.22–5.81)

## 2013-01-14 MED ORDER — NITROGLYCERIN 0.4 MG SL SUBL: 0.4000 mg | SUBLINGUAL_TABLET | SUBLINGUAL | Status: DC | PRN

## 2013-01-14 MED ORDER — INSULIN ASPART PROT & ASPART (70-30 MIX) 100 UNIT/ML ~~LOC~~ SUSP
10.0000 [IU] | Freq: Two times a day (BID) | SUBCUTANEOUS | Status: DC
  Administered 2013-01-15: 10 [IU] via SUBCUTANEOUS
  Filled 2013-01-14: qty 10

## 2013-01-14 MED ORDER — INSULIN DETEMIR 100 UNIT/ML ~~LOC~~ SOLN: 15.0000 [IU] | Freq: Every day | SUBCUTANEOUS | Status: DC

## 2013-01-14 MED ORDER — POTASSIUM CHLORIDE CRYS ER 20 MEQ PO TBCR
EXTENDED_RELEASE_TABLET | ORAL | Status: AC
  Administered 2013-01-14: 40 meq via ORAL
  Filled 2013-01-14: qty 2

## 2013-01-14 MED ORDER — TICAGRELOR 90 MG PO TABS: 90.0000 mg | ORAL_TABLET | Freq: Two times a day (BID) | ORAL | Status: DC

## 2013-01-14 MED ORDER — METFORMIN HCL ER (MOD) 1000 MG PO TB24: 1000.0000 mg | ORAL_TABLET | Freq: Two times a day (BID) | ORAL | Status: DC

## 2013-01-14 MED ORDER — LIVING WELL WITH DIABETES BOOK - IN SPANISH
Freq: Once | Status: AC
  Administered 2013-01-14: 12:00:00
  Filled 2013-01-14: qty 1

## 2013-01-14 MED ORDER — INSULIN NPH ISOPHANE & REGULAR (70-30) 100 UNIT/ML ~~LOC~~ SUSP: 10.0000 [IU] | Freq: Two times a day (BID) | SUBCUTANEOUS | Status: DC

## 2013-01-14 MED ORDER — ATORVASTATIN CALCIUM 80 MG PO TABS: 80.0000 mg | ORAL_TABLET | Freq: Every day | ORAL | Status: DC

## 2013-01-14 MED ORDER — METFORMIN HCL 500 MG PO TABS
1000.0000 mg | ORAL_TABLET | Freq: Two times a day (BID) | ORAL | Status: DC
  Administered 2013-01-15: 1000 mg via ORAL
  Filled 2013-01-14 (×4): qty 2

## 2013-01-14 MED ORDER — BD GETTING STARTED TAKE HOME KIT: 3/10ML X 30G SYRINGES
1.0000 | Freq: Once | Status: AC
  Administered 2013-01-14: 1
  Filled 2013-01-14: qty 1

## 2013-01-14 MED ORDER — POTASSIUM CHLORIDE CRYS ER 20 MEQ PO TBCR: 40.0000 meq | EXTENDED_RELEASE_TABLET | Freq: Once | ORAL | Status: AC

## 2013-01-14 MED ORDER — ASPIRIN 81 MG PO TBEC: 81.0000 mg | DELAYED_RELEASE_TABLET | Freq: Every day | ORAL | Status: AC

## 2013-01-14 MED ORDER — BD GETTING STARTED TAKE HOME KIT: 1/2ML X 30G SYRINGES: 1.0000 | Freq: Once | Status: DC

## 2013-01-14 NOTE — Telephone Encounter (Signed)
New Problem:    I scheduled a 7 day TCM appointment per Alinda Money PA on 01/20/13 at 1:45 p.m. with Dr. Antoine Poche in the [place] office.

## 2013-01-14 NOTE — Progress Notes (Signed)
Attempting to discharge pt, when pt suddenly had severe bilateral arm pain that radiated from shoulders to finger tips. Pt stated it felt like "his arms were about to explode."  VSS, although EKG showed mild changes per Alinda Money, cardiology PA. Discharged cancelled for overnight observation. Will continue to monitor patient.

## 2013-01-14 NOTE — Telephone Encounter (Signed)
TCM pt . Attempted to call pt x 3 home  phone has been disconnected. Pt has post cath office visit with Dr. Antoine Poche in [place] on 01/21/13 at 1:45 PM.

## 2013-01-14 NOTE — Progress Notes (Signed)
Spoke with pt re MI, stent, Brilinta, DM management, ex, NTG, CRPII. Voices understanding but is overwhelmed. Sts he has been on insulin in past but it caused his feet to swell. Sts "it doesn't work for him". Sts he has 4 boxes at home. Gave diet resources in [place] and [place]. Interested in CRPII in [place] and gave pt financial aid application however gas cost might be an issue. Pt is beginning to understand how important his health is, esp controlling DM. Needs more ed. Gave videos to watch.  [id number] Ethelda Chick CES, ACSM 01/14/2013 11:58 AM

## 2013-01-14 NOTE — Progress Notes (Signed)
Inpatient Diabetes Program Recommendations  AACE/ADA: New Consensus Statement on Inpatient Glycemic Control (2013)  Target Ranges:  Prepandial:   less than 140 mg/dL      Peak postprandial:   less than 180 mg/dL (1-2 hours)      Critically ill patients:  140 - 180 mg/dL   Inpatient Diabetes Program Recommendations Insulin - Basal: DC Levemir and start Novolin 70/30 (ReliOn [employer] brand) Oral Agents: continue Metformin (increase to 1000 mg BID) but DC Glyburide Diet: add Carbohydrate modified to diet Diabetes Coordinator met with patient to discuss A1C and the need for insulin at home.  The patient knows how to give injection but is concerned that he may have side effects from insulin.  He has noticed leg, ankle, knee swelling when using insulin.  The insulin that he has at home is also Levemir which he is on here in the hospital.  The insulin he has been ordered at discharge is Novolin 70/30.  He is willing to try a different insulin and hopes that it will not cause swelling.  Discussed s/s hypoglycemia and treatment.  Patient does not have any further questions/concerns at this time.   Thank you  Piedad Climes BSN, RN,CDE Inpatient Diabetes Coordinator [phone] (team pager)

## 2013-01-14 NOTE — Discharge Summary (Addendum)
Discharge Summary   Patient ID: Ethan Peters,  MRN: [id number], DOB/AGE: 1955-12-16 57 y.o.  Admit date: 01/09/2013 Discharge date: 01/15/2013  Primary Physician: Default, Provider, MD Primary Cardiologist: New to cardiology - followed by Dr. Swaziland this admission  Discharge Diagnoses Principal Problem:   ST elevation myocardial infarction (STEMI) of inferior wall  - s/p emergent cardiac cath + PCI 01/09/13: 30% pLAD, 30% mLAD, 70% pLCx, RCA occlusion at crux with R->L distal collaterals s/p DES; LVEF 50%, moderate-severe HK of inferior basal wall  - ASA/Brilinta x 12 months  - Complicated by persistent post-PCI hypotension transiently requiring dopamine support Active Problems:   CAD (coronary artery disease), native coronary artery  - To resume ASA/Brilinta/atorvastatin/NTG SL PRN on discharge  - Will need LFTs, lipid panel in 6 weeks post-statin initiation\   Ischemic cardiomyopathy  - 2D echo 01/10/13: EF 45-50%, grade 1 diastolic dysfunction, mildly dilated RV, RA at the upper limits of normal, mild TR, PA systolic pressure 32 mm mercury and hypokinesis to akinesis of the basal mid inferior myocardium.   Poorly controlled type 2 diabetes mellitus   - Hgb A1C 14.8%   - To be discharged on basal, prandial insulin  - Continue metformin  - Discontinue glyburide per diabetes coordinator recommendations   HTN (hypertension)   Hypokalemia   Alcohol intoxication   Allergies Allergies  Allergen Reactions  . Penicillins Rash    Lip swelling    Diagnostic Studies/Procedures  CARDIAC CATHETERIZATION + PERCUTANEOUS CORONARY INTERVENTION - 01/09/13  Hemodynamics:  AO 80/57 with a mean of 68 mmHg  LV 80/22 mmHg  Coronary angiography:  Coronary dominance: right  Left mainstem: Left main coronary is very short without significant disease.  Left anterior descending (LAD): The left anterior descending artery has a 30% stenosis in the proximal vessel. There is also 30% stenosis in  the mid vessel. The first diagonal is without significant disease.  Left circumflex (LCx): Left circumflex gives rise to a single marginal branch. There is segmental 70% stenosis in the proximal marginal branch.  Right coronary artery (RCA): The right coronary is a dominant vessel. It is occluded at the crux. There are left to right collaterals to the distal RCA.  Left ventriculography: Left ventricular angiography performed at the end of the procedure demonstrates moderate to severe hypokinesis of the inferior basal wall. Overall ejection fraction is estimated at 50% there is no significant mitral insufficiency.    PCI Data:  Vessel - RCA/Segment - crux  Percent Stenosis (pre) 100%  TIMI-flow 0  Stent 2.5 x 16 mm Promus Premier  Percent Stenosis (post) 0%  TIMI-flow (post) 3  Final Conclusions:  1. 2 vessel obstructive coronary disease. The right coronary was the culprit vessel and was occluded. There is moderate disease in the first obtuse marginal vessel.  2. Mild left ventricular dysfunction.  3. Successful stenting of the crux of the right coronary with a drug-eluting stent.   TRANSTHORACIC ECHOCARDIOGRAM - 01/10/13  - Left ventricle: The cavity size was normal. Wall thickness was normal. Systolic function was mildly reduced. The estimated ejection fraction was in the range of 45% to 50%. There is hypokinesis to [place] the basal-mid inferior myocardium. Doppler parameters are consistent with abnormal left ventricular relaxation (grade 1 diastolic dysfunction). - Mitral valve: Trivial regurgitation. - Right ventricle: The cavity size was mildly dilated. Systolic function was low normal. - Right atrium: The atrium was at the upper limits of normal in size. Central venous pressure: 10mm Hg (est). -  Tricuspid valve: Mild regurgitation. - Pulmonary arteries: PA peak pressure: 25mm Hg (S). - Pericardium, extracardiac: There was no pericardial effusion.  PA/LATERAL CHEST X-RAY -  01/12/13  IMPRESSION:  New small bilateral pleural effusions and mild bibasilar  atelectasis.  History of Present Illness Ethan Peters is a 57 y.o. minimally English-speaking Hispanic male who was admitted to [facility] on 01/01/13 with the above problem list.   He has no prior cardiac history. Past medical history was notable for type 2 diabetes mellitus and hypertension. He reported experiencing substernal dull constant chest discomfort rated at a 5/10 occurring the night prior to admission. The pain persisted into the following day and EMS was called. Of note, reviewing the ED notes. The patient was apparently intoxicated on arrival had been drinking for the past 2 days. On initial review of the EKG acquired by EMS, ST changes were nondiagnostic for STEMI. However, a repeat EKG was performed in the emergency department which did reveal significant inferior ST elevations consistent with STEMI. Code STEMI was activated the patient was transversely to the cath lab.  Hospital Course  As above, this revealed culprit RCA occlusion. A drug-eluting stent was placed to lesion with good result. He did have diffuse nonobstructive CAD elsewhere. LVEF was noted to be 50%. He did have moderate to severe inferobasal hypokinesis. The patient did develop hypotension limiting the initiation of an ACE inhibitor or beta blocker. Recommendation was made to continue dual antiplatelet therapy-aspirin and Brilinta x 12 months. His blood pressure remained labile requiring the use of dopamine pressor support. This was eventually weaned with the use of intermittent IV boluses. He did develop recurrent chest pain shortly after PCI. A repeat EKG revealed no worsening ST changes He was started on sliding scale insulin while inpatient. Hemoglobin A1c did return markedly elevated at 14.8%. Lipid profile indicated LDL 87, HDL 52, triglycerides 94, total cholesterol 158. TSH returned within normal limits. He did develop  transient, mild hypokalemia which was successfully repleted.  He underwent a 2-D echocardiogram which as above, revealed EF 45-50%, grade 1 diastolic dysfunction, mildly dilated RV, RA at the upper limits of normal, mild TR, PA systolic pressure 32 mm mercury and hypokinesis to akinesis of the basal mid inferior myocardium. His blood pressure remained labile throughout the admission including the addition of an ACE inhibitor or beta blocker. He steadily improved and ambulated well with cardiac rehabilitation. Diabetes coordinator was consulted for further glycemic control recommendations. The patient was started on basal insulin in addition SSI and oral hypoglycemics.  This morning, the patient was evaluated by Dr. Swaziland and deemed stable for discharge. The patient lacks insurance and will need to be discharged with Brilinta assistance. Additionally, the case management has been consulted for assistance with other outpatient medications including insulin and statin. Diabetes education will be provided in [place]. LFTs and a repeat lipid panel will need to be checked in 6 weeks since starting a statin. Of note, he exhibited no signs or symptoms of alcohol withdrawal this admission. He was advised to reduce his alcohol consumption. He will followup in the medicine office within 7 days given post-STEMI status. This information, including post cath instructions and activity her church and was, has been clearly outlined in the discharge AVS.   ADDENDUM:  Just prior to discharge on 01/14/13, the patient experienced sharp bilateral arm pain and tingling while attempting to sit up in bed. EKG revealed nonspecific anterior TW changes, no ST changes. Exam was unremarkable, and the  patient was euvolemic. Given his recent STEMI, he was observed for an additional night. He was evaluated this morning by Dr. Swaziland who deemed him stable for discharge. He added Xanax for anxiety and Lopressor for mild hypertension.    Discharge Vitals:  Blood pressure 123/82, pulse 70, temperature 98.1 F (36.7 C), temperature source Oral, resp. rate 18, height 5\' 6"  (1.676 m), weight 72.666 kg (160 lb 3.2 oz), SpO2 96.00%.   Weight change: -2.631 kg (-5 lb 12.8 oz)  Labs: Recent Labs     01/14/13  0512  01/15/13  0550  WBC  6.2  6.2  HGB  11.2*  11.9*  HCT  30.8*  33.3*  MCV  85.6  85.4  PLT  206  225   Recent Labs Lab 01/09/13 2039  01/13/13 0415 01/14/13 0512 01/15/13 0550  NA 134*  < > 136 138 137  K 3.9  < > 3.4* 3.4* 3.8  CL 99  < > 101 104 104  CO2 19  < > 27 27 23   BUN 12  < > 10 11 8   CREATININE 0.84  < > 0.90 0.85 0.87  CALCIUM 8.5  < > 8.1* 8.5 8.9  PROT 6.9  --   --   --   --   BILITOT 0.6  --   --   --   --   ALKPHOS 88  --   --   --   --   ALT 42  --   --   --   --   AST 247*  --   --   --   --   GLUCOSE 351*  < > 256* 119* 185*  < > = values in this interval not displayed.  Disposition:  Discharge Orders   Future Appointments Provider Department Dept Phone   01/21/2013 1:45 PM Rollene Rotunda, MD [facility] [phone]   Future Orders Complete By Expires     Amb Referral to Cardiac Rehabilitation  As directed     Diet - low sodium heart healthy  As directed     Increase activity slowly  As directed           Follow-up Information   Follow up with Rollene Rotunda, MD On 01/21/2013. (A las 1:45 PM para visitar despues del hospital )    Contact information:   [address] [place] [address] [phone]       Please follow up. (Por favor visita un medico en [facility] en una o [facility] semanas)       Follow up with MUSE,ROCHELLE D., PA-C On 01/16/2013. (@ 2:30 pm please take d/c summary and medication list with you to appointment. Please bring tax forms to appointment. Thanks )    Contact information:   [facility] [address] [place] [address] [phone]        Discharge Medications:    Medication List    STOP taking these medications       aspirin 325 MG tablet     glyBURIDE 5 MG tablet  Commonly known as:  DIABETA     ibuprofen 800 MG tablet  Commonly known as:  ADVIL,MOTRIN      TAKE these medications       allopurinol 300 MG tablet  Commonly known as:  ZYLOPRIM  Take 300 mg by mouth daily as needed (gout pain).     ALPRAZolam 0.25 MG tablet  Commonly known as:  XANAX  Take 1 tablet (0.25 mg total) by mouth 3 (three) times daily as needed for anxiety.     aspirin 81 MG EC tablet  Take 1 tablet (81 mg total) by mouth daily.     atorvastatin 80 MG tablet  Commonly known as:  LIPITOR  Take 1 tablet (80 mg total) by mouth daily at 6 PM.     b complex vitamins tablet  Take 1 tablet by mouth 3 (three) times a week.     Fish Oil 1000 MG Caps  Take 2,000 mg by mouth daily.     GLUCOSAMINE PO  Take 1 capsule by mouth daily.     insulin NPH-regular (70-30) 100 UNIT/ML injection  Commonly known as:  RELION 70/30  Inject 10 Units into the skin 2 (two) times daily with a meal. 10 units at breakfast. 10 units at supper.     metFORMIN 1000 MG (MOD) 24 hr tablet  Commonly known as:  GLUMETZA  Take 1 tablet (1,000 mg total) by mouth 2 (two) times daily with a meal.     metoprolol tartrate 12.5 mg Tabs  Commonly known as:  LOPRESSOR  Take 0.5 tablets (12.5 mg total) by mouth 2 (two) times daily.     multivitamin with minerals Tabs  Take 1 tablet by mouth daily.     nitroGLYCERIN 0.4 MG SL tablet  Commonly known as:  NITROSTAT  Place 1 tablet (0.4 mg total) under the tongue every 5 (five) minutes x 3 doses as needed for chest pain.     Ticagrelor 90 MG Tabs tablet  Commonly known as:  BRILINTA  Take 1 tablet (90 mg total) by mouth 2 (two) times daily.       Outstanding Labs/Studies: LFTs, lipid panel in 6 weeks  Duration of Discharge Encounter: Greater than 30 minutes including physician time.  Signed, R. Hurman Horn, PA-C 01/15/2013, 10:20 AM

## 2013-01-14 NOTE — Progress Notes (Signed)
SUBJECTIVE: Feels well. Ambulated in halls yesterday. Breathing improved. No chest pain.   PHYSICAL EXAM Filed Vitals:   01/13/13 1136 01/13/13 1412 01/13/13 2040 01/14/13 0615  BP: 102/66 114/65 98/62 111/68  Pulse:  76 73 85  Temp: 98.3 F (36.8 C) 98.3 F (36.8 C) 98.2 F (36.8 C) 98.8 F (37.1 C)  TempSrc: Oral Oral Oral Oral  Resp:  18  18  Height:      Weight:    166 lb (75.297 kg)  SpO2:  97% 97% 97%   General:  No acute distress Lungs:  Clear Heart:  RRR, no rub Abdomen:  Positive bowel sounds, no rebound no guarding Extremities:  No edema, right wrist without bruising or bleeding Neuro:  Nonfocal  LABS: Lab Results  Component Value Date   TROPONINI >20.00* 01/10/2013   Results for orders placed during the hospital encounter of 01/09/13 (from the past 24 hour(s))  GLUCOSE, CAPILLARY     Status: Abnormal   Collection Time    01/13/13 11:37 AM      Result Value Range   Glucose-Capillary 273 (*) 70 - 99 mg/dL  GLUCOSE, CAPILLARY     Status: Abnormal   Collection Time    01/13/13  4:40 PM      Result Value Range   Glucose-Capillary 138 (*) 70 - 99 mg/dL   Comment 1 Notify RN    GLUCOSE, CAPILLARY     Status: Abnormal   Collection Time    01/13/13  8:43 PM      Result Value Range   Glucose-Capillary 207 (*) 70 - 99 mg/dL  GLUCOSE, CAPILLARY     Status: Abnormal   Collection Time    01/13/13 10:19 PM      Result Value Range   Glucose-Capillary 194 (*) 70 - 99 mg/dL  CBC     Status: Abnormal   Collection Time    01/14/13  5:12 AM      Result Value Range   WBC 6.2  4.0 - 10.5 K/uL   RBC 3.60 (*) 4.22 - 5.81 MIL/uL   Hemoglobin 11.2 (*) 13.0 - 17.0 g/dL   HCT 21.3 (*) 08.6 - 57.8 %   MCV 85.6  78.0 - 100.0 fL   MCH 31.1  26.0 - 34.0 pg   MCHC 36.4 (*) 30.0 - 36.0 g/dL   RDW 46.9  62.9 - 52.8 %   Platelets 206  150 - 400 K/uL  BASIC METABOLIC PANEL     Status: Abnormal   Collection Time    01/14/13  5:12 AM      Result Value Range   Sodium 138   135 - 145 mEq/L   Potassium 3.4 (*) 3.5 - 5.1 mEq/L   Chloride 104  96 - 112 mEq/L   CO2 27  19 - 32 mEq/L   Glucose, Bld 119 (*) 70 - 99 mg/dL   BUN 11  6 - 23 mg/dL   Creatinine, Ser 4.13  0.50 - 1.35 mg/dL   Calcium 8.5  8.4 - 24.4 mg/dL   GFR calc non Af Amer >90  >90 mL/min   GFR calc Af Amer >90  >90 mL/min    Intake/Output Summary (Last 24 hours) at 01/14/13 0827 Last data filed at 01/14/13 0818  Gross per 24 hour  Intake    733 ml  Output      0 ml  Net    733 ml    EKG:   NSR, inferior ST  elevation unchanged form yesterday with the evolution of T wave inversion consistent with recent inferior MI.  01/14/2013  Transthoracic Echocardiography  Patient: Khale, Nigh MR #: [id number] Study Date: 01/10/2013 Gender: M Age: 57 Height: 167.6cm Weight: 74.4kg BSA: 1.31m^2 Pt. Status: Room: MCCL  ORDERING Sw[place], Peter PERFORMING [place], [facility] ATTENDING Dione Booze SONOGRAPHER Georgian Co, RDCS, CCT cc:  ------------------------------------------------------------ LV EF: 45% - 50%  ------------------------------------------------------------ Indications: MI - acute 410.91.  ------------------------------------------------------------ History: Risk factors: Hypertension. Diabetes mellitus.  ------------------------------------------------------------ Study Conclusions  - Left ventricle: The cavity size was normal. Wall thickness was normal. Systolic function was mildly reduced. The estimated ejection fraction was in the range of 45% to 50%. There is hypokinesis to [place] the basal-mid inferior myocardium. Doppler parameters are consistent with abnormal left ventricular relaxation (grade 1 diastolic dysfunction). - Mitral valve: Trivial regurgitation. - Right ventricle: The cavity size was mildly dilated. Systolic function was low normal. - Right atrium: The atrium was at the upper limits of normal in size. Central venous pressure: 10mm Hg  (est). - Tricuspid valve: Mild regurgitation. - Pulmonary arteries: PA peak pressure: 25mm Hg (S). - Pericardium, extracardiac: There was no pericardial effusion. Transthoracic echocardiography. M-mode, complete 2D, spectral Doppler, and color Doppler. Height: Height: 167.6cm. Height: 66in. Weight: Weight: 74.4kg. Weight: 163.7lb. Body mass index: BMI: 26.5kg/m^2. Body surface area: BSA: 1.3m^2. Blood pressure: 98/75. Patient status: Inpatient. Location: ICU/CCU  ------------------------------------------------------------  ------------------------------------------------------------ Left ventricle: The cavity size was normal. Wall thickness was normal. Systolic function was mildly reduced. The estimated ejection fraction was in the range of 45% to 50%. Regional wall motion abnormalities: There is hypokinesis to akinesis of the basal-mid inferior myocardium. Doppler parameters are consistent with abnormal left ventricular relaxation (grade 1 diastolic dysfunction).  ------------------------------------------------------------ Aortic valve: Trileaflet. Cusp separation was normal. Doppler: No significant regurgitation.  ------------------------------------------------------------ Aorta: Aortic root: The aortic root was normal in size.  ------------------------------------------------------------ Mitral valve: The valve appears to be grossly normal. Doppler: Trivial regurgitation. Peak gradient: 2mm Hg (D).  ------------------------------------------------------------ Left atrium: The atrium was normal in size.  ------------------------------------------------------------ Right ventricle: The cavity size was mildly dilated. Systolic function was low normal.  ------------------------------------------------------------ Pulmonic valve: The valve appears to be grossly normal. Doppler: Physiologic  regurgitation.  ------------------------------------------------------------ Tricuspid valve: The valve appears to be grossly normal. Doppler: Mild regurgitation.  ------------------------------------------------------------ Right atrium: The atrium was at the upper limits of normal in size.  ------------------------------------------------------------ Pericardium: There was no pericardial effusion.  ------------------------------------------------------------ Systemic veins: Inferior vena cava: The vessel was normal in size; the respirophasic diameter changes were blunted (< 50%).  ------------------------------------------------------------  2D measurements Normal Doppler Normal Left ventricle measurements LVID ED, 42.8 mm 43-52 Main pulmonary chord, artery PLAX Pressure, S 25 mm =30 LVID ES, 29.8 mm 23-38 Hg chord, Left ventricle PLAX Ea, lat 6.47 cm/ ------- FS, chord, 30 % >29 ann, tiss s PLAX DP LVPW, ED 10.6 mm ------ E/Ea, lat 11.19 ------- IVS/LVPW 0.95 <1.3 ann, tiss ratio, ED DP Vol ED, 89 ml ------ Ea, med 5.48 cm/ ------- MOD1 ann, tiss s Vol ES, 50 ml ------ DP MOD1 E/Ea, med 13.21 ------- EF, MOD1 44 % ------ ann, tiss Vol index, 48 ml/m^2 ------ DP ED, MOD1 Mitral valve Vol index, 27 ml/m^2 ------ Peak E vel 72.4 cm/ ------- ES, MOD1 s Vol ED, 90 ml ------ Peak A vel 88.8 cm/ ------- MOD2 s Vol ES, 48 ml ------ Deceleratio 275 ms 150-230 MOD2 n time EF, MOD2 47 % ------ Peak 2 mm ------- Stroke  42 ml ------ gradient, D Hg vol, MOD2 Peak E/A 0.8 ------- Vol index, 49 ml/m^2 ------ ratio ED, MOD2 Tricuspid valve Vol index, 26 ml/m^2 ------ Regurg peak 195 cm/ ------- ES, MOD2 vel s Stroke 22.8 ml/m^2 ------ Peak RV-RA 15 mm ------- index, gradient, S Hg MOD2 Systemic veins Ventricular septum Estimated 10 mm ------- IVS, ED 10.1 mm ------ CVP Hg Aorta Right ventricle Root diam, 30 mm ------ Pressure, S 25 mm <30 ED Hg Left atrium Sa vel, lat  10.5 cm/ ------- AP dim 34 mm ------ ann, tiss s AP dim 1.85 cm/m^2 <2.2 DP index Vol, S 38 ml ------ Vol index, 20.7 ml/m^2 ------ S Right ventricle RVID ED, 31.1 mm 19-38 PLAX  ------------------------------------------------------------ Prepared and Electronically Authenticated by  Nona Dell 2014-03-29T13:31:[id number]   ASSESSMENT AND PLAN:  1. ST elevation myocardial infarction (STEMI) of inferior wall:  S/P DES to RCA. BP is stable but still on low side. Continue ASA and Brilinta. Medication assistance forms signed in chart. Not a candidate for ACEi or beta blocker now due to hypotension. Will consider low dose on follow up office visit. Will plan discharge today. Patient lives in [place] so it will be more convenient to follow up in one of our [employer].  2. Poorly controlled type 2 diabetes mellitus: A1C 14.  On SSI and diabeta. Will resume metformin 500 mg bid at discharge. Patient previously followed at [facility].  3. HTN (hypertension):   BP low as above.   4. Hyperlipidemia on lipitor.    [name] [facility] 01/14/2013 8:27 AM

## 2013-01-14 NOTE — Discharge Summary (Signed)
Patient seen and examined and history reviewed. Agree with above findings and plan. See my earlier rounding note.  [name] [facility] 01/14/2013 1:03 PM

## 2013-01-15 DIAGNOSIS — I2510 Atherosclerotic heart disease of native coronary artery without angina pectoris: Secondary | ICD-10-CM

## 2013-01-15 LAB — BASIC METABOLIC PANEL
CO2: 23 mEq/L (ref 19–32)
Chloride: 104 mEq/L (ref 96–112)
GFR calc Af Amer: >90 mL/min (ref >90)
Potassium: 3.8 mEq/L (ref 3.5–5.1)
Sodium: 137 mEq/L (ref 135–145)

## 2013-01-15 LAB — CBC
MCV: 85.4 fL (ref 78.0–100.0)
Platelets: 225 10*3/uL (ref 150–400)
RBC: 3.9 MIL/uL — ABNORMAL LOW (ref 4.22–5.81)
WBC: 6.2 10*3/uL (ref 4.0–10.5)

## 2013-01-15 MED ORDER — METOPROLOL TARTRATE 12.5 MG HALF TABLET: 12.5000 mg | ORAL_TABLET | Freq: Two times a day (BID) | ORAL | Status: DC

## 2013-01-15 MED ORDER — ALPRAZOLAM 0.25 MG PO TABS: 0.2500 mg | ORAL_TABLET | Freq: Three times a day (TID) | ORAL | Status: DC | PRN

## 2013-01-15 MED ORDER — METOPROLOL TARTRATE 12.5 MG HALF TABLET
12.5000 mg | ORAL_TABLET | Freq: Two times a day (BID) | ORAL | Status: DC
  Filled 2013-01-15 (×2): qty 1

## 2013-01-15 NOTE — Progress Notes (Signed)
UR Completed Miles Leyda Graves-Bigelow, RN,BSN [phone]  

## 2013-01-15 NOTE — Discharge Summary (Signed)
Patient seen and examined and history reviewed. Agree with above findings and plan. See my earlier rounding note.  [name] [facility] 01/15/2013 1:43 PM

## 2013-01-15 NOTE — Progress Notes (Signed)
SUBJECTIVE: Feels well this am. Some constant soreness in chest. Had acute sharp pain in both arms yesterday. Lasted about 5 minutes. Patient states his arms turned white. No recurrence. Ecg improved.   PHYSICAL EXAM Filed Vitals:   01/14/13 1345 01/14/13 2110 01/15/13 0450 01/15/13 0500  BP: 119/66 115/72 123/82   Pulse: 72 70 70   Temp: 97.5 F (36.4 C) 98.8 F (37.1 C) 98.1 F (36.7 C)   TempSrc: Oral Oral Oral   Resp: 17 18 18    Height:      Weight:    160 lb 3.2 oz (72.666 kg)  SpO2: 96% 97% 96%    General:  No acute distress Lungs:  Clear Heart:  RRR, no rub Abdomen:  Positive bowel sounds, no rebound no guarding Extremities:  No edema, right wrist without bruising or bleeding. Pulses 2+ throughout. Neuro:  Nonfocal  LABS: Lab Results  Component Value Date   TROPONINI >20.00* 01/10/2013   Results for orders placed during the hospital encounter of 01/09/13 (from the past 24 hour(s))  GLUCOSE, CAPILLARY     Status: Abnormal   Collection Time    01/14/13 11:23 AM      Result Value Range   Glucose-Capillary 218 (*) 70 - 99 mg/dL   Comment 1 Notify RN    GLUCOSE, CAPILLARY     Status: None   Collection Time    01/14/13  4:48 PM      Result Value Range   Glucose-Capillary 82  70 - 99 mg/dL   Comment 1 Notify RN    GLUCOSE, CAPILLARY     Status: Abnormal   Collection Time    01/14/13  8:57 PM      Result Value Range   Glucose-Capillary 243 (*) 70 - 99 mg/dL   Comment 1 Notify RN    GLUCOSE, CAPILLARY     Status: Abnormal   Collection Time    01/14/13 10:38 PM      Result Value Range   Glucose-Capillary 221 (*) 70 - 99 mg/dL   Comment 1 Notify RN    CBC     Status: Abnormal   Collection Time    01/15/13  5:50 AM      Result Value Range   WBC 6.2  4.0 - 10.5 K/uL   RBC 3.90 (*) 4.22 - 5.81 MIL/uL   Hemoglobin 11.9 (*) 13.0 - 17.0 g/dL   HCT 16.1 (*) 09.6 - 04.5 %   MCV 85.4  78.0 - 100.0 fL   MCH 30.5  26.0 - 34.0 pg   MCHC 35.7  30.0 - 36.0 g/dL   RDW 40.9  81.1 - 91.4 %   Platelets 225  150 - 400 K/uL  BASIC METABOLIC PANEL     Status: Abnormal   Collection Time    01/15/13  5:50 AM      Result Value Range   Sodium 137  135 - 145 mEq/L   Potassium 3.8  3.5 - 5.1 mEq/L   Chloride 104  96 - 112 mEq/L   CO2 23  19 - 32 mEq/L   Glucose, Bld 185 (*) 70 - 99 mg/dL   BUN 8  6 - 23 mg/dL   Creatinine, Ser 7.82  0.50 - 1.35 mg/dL   Calcium 8.9  8.4 - 95.6 mg/dL   GFR calc non Af Amer >90  >90 mL/min   GFR calc Af Amer >90  >90 mL/min  GLUCOSE, CAPILLARY     Status: Abnormal  Collection Time    01/15/13  7:59 AM      Result Value Range   Glucose-Capillary 195 (*) 70 - 99 mg/dL   Comment 1 Documented in Chart     Comment 2 Notify RN      Intake/Output Summary (Last 24 hours) at 01/15/13 0825 Last data filed at 01/14/13 1737  Gross per 24 hour  Intake    720 ml  Output      0 ml  Net    720 ml    EKG:   NSR, inferior ST elevation unchanged form yesterday with the evolution of T wave inversion consistent with recent inferior MI.  01/15/2013  Transthoracic Echocardiography  Patient: Ethan, Peters MR #: [id number] Study Date: 01/10/2013 Gender: M Age: 57 Height: 167.6cm Weight: 74.4kg BSA: 1.75m^2 Pt. Status: Room: [facility]  ORDERING [place], Peter PERFORMING [place] ATTENDING Dione Booze SONOGRAPHER Georgian Co, RDCS, CCT cc:  ------------------------------------------------------------ LV EF: 45% - 50%  ------------------------------------------------------------ Indications: MI - acute 410.91.  ------------------------------------------------------------ History: Risk factors: Hypertension. Diabetes mellitus.  ------------------------------------------------------------ Study Conclusions  - Left ventricle: The cavity size was normal. Wall thickness was normal. Systolic function was mildly reduced. The estimated ejection fraction was in the range of 45% to 50%. There is hypokinesis to  [place] the basal-mid inferior myocardium. Doppler parameters are consistent with abnormal left ventricular relaxation (grade 1 diastolic dysfunction). - Mitral valve: Trivial regurgitation. - Right ventricle: The cavity size was mildly dilated. Systolic function was low normal. - Right atrium: The atrium was at the upper limits of normal in size. Central venous pressure: 10mm Hg (est). - Tricuspid valve: Mild regurgitation. - Pulmonary arteries: PA peak pressure: 25mm Hg (S). - Pericardium, extracardiac: There was no pericardial effusion. Transthoracic echocardiography. M-mode, complete 2D, spectral Doppler, and color Doppler. Height: Height: 167.6cm. Height: 66in. Weight: Weight: 74.4kg. Weight: 163.7lb. Body mass index: BMI: 26.5kg/m^2. Body surface area: BSA: 1.31m^2. Blood pressure: 98/75. Patient status: Inpatient. Location: ICU/CCU  ------------------------------------------------------------  ------------------------------------------------------------ Left ventricle: The cavity size was normal. Wall thickness was normal. Systolic function was mildly reduced. The estimated ejection fraction was in the range of 45% to 50%. Regional wall motion abnormalities: There is hypokinesis to akinesis of the basal-mid inferior myocardium. Doppler parameters are consistent with abnormal left ventricular relaxation (grade 1 diastolic dysfunction).  ------------------------------------------------------------ Aortic valve: Trileaflet. Cusp separation was normal. Doppler: No significant regurgitation.  ------------------------------------------------------------ Aorta: Aortic root: The aortic root was normal in size.  ------------------------------------------------------------ Mitral valve: The valve appears to be grossly normal. Doppler: Trivial regurgitation. Peak gradient: 2mm Hg (D).  ------------------------------------------------------------ Left atrium: The atrium was  normal in size.  ------------------------------------------------------------ Right ventricle: The cavity size was mildly dilated. Systolic function was low normal.  ------------------------------------------------------------ Pulmonic valve: The valve appears to be grossly normal. Doppler: Physiologic regurgitation.  ------------------------------------------------------------ Tricuspid valve: The valve appears to be grossly normal. Doppler: Mild regurgitation.  ------------------------------------------------------------ Right atrium: The atrium was at the upper limits of normal in size.  ------------------------------------------------------------ Pericardium: There was no pericardial effusion.  ------------------------------------------------------------ Systemic veins: Inferior vena cava: The vessel was normal in size; the respirophasic diameter changes were blunted (< 50%).  ------------------------------------------------------------  2D measurements Normal Doppler Normal Left ventricle measurements LVID ED, 42.8 mm 43-52 Main pulmonary chord, artery PLAX Pressure, S 25 mm =30 LVID ES, 29.8 mm 23-38 Hg chord, Left ventricle PLAX Ea, lat 6.47 cm/ ------- FS, chord, 30 % >29 ann, tiss s PLAX DP LVPW, ED 10.6 mm ------ E/Ea, lat 11.19 ------- IVS/LVPW 0.95 <  1.3 ann, tiss ratio, ED DP Vol ED, 89 ml ------ Ea, med 5.48 cm/ ------- MOD1 ann, tiss s Vol ES, 50 ml ------ DP MOD1 E/Ea, med 13.21 ------- EF, MOD1 44 % ------ ann, tiss Vol index, 48 ml/m^2 ------ DP ED, MOD1 Mitral valve Vol index, 27 ml/m^2 ------ Peak E vel 72.4 cm/ ------- ES, MOD1 s Vol ED, 90 ml ------ Peak A vel 88.8 cm/ ------- MOD2 s Vol ES, 48 ml ------ Deceleratio 275 ms 150-230 MOD2 n time EF, MOD2 47 % ------ Peak 2 mm ------- Stroke 42 ml ------ gradient, D Hg vol, MOD2 Peak E/A 0.8 ------- Vol index, 49 ml/m^2 ------ ratio ED, MOD2 Tricuspid valve Vol index, 26 ml/m^2 ------ Regurg  peak 195 cm/ ------- ES, MOD2 vel s Stroke 22.8 ml/m^2 ------ Peak RV-RA 15 mm ------- index, gradient, S Hg MOD2 Systemic veins Ventricular septum Estimated 10 mm ------- IVS, ED 10.1 mm ------ CVP Hg Aorta Right ventricle Root diam, 30 mm ------ Pressure, S 25 mm <30 ED Hg Left atrium Sa vel, lat 10.5 cm/ ------- AP dim 34 mm ------ ann, tiss s AP dim 1.85 cm/m^2 <2.2 DP index Vol, S 38 ml ------ Vol index, 20.7 ml/m^2 ------ S Right ventricle RVID ED, 31.1 mm 19-38 PLAX  ------------------------------------------------------------ Prepared and Electronically Authenticated by  Nona Dell 2014-03-29T13:[id number]   ASSESSMENT AND PLAN:  1. ST elevation myocardial infarction (STEMI) of inferior wall:  S/P DES to RCA. Continue ASA and Brilinta. Medication assistance forms signed in chart. BP improved will start low dose beta blocker. Will plan discharge again today. Patient lives in [place] so it will be more convenient to follow up in one of our [employer]. I am not sure what caused his arm pain yesterday. Will give Rx for Xanax prn as patient seems very anxious.  2. Poorly controlled type 2 diabetes mellitus: A1C 14.  On SSI and diabeta. Will resume metformin 500 mg bid at discharge. Patient previously followed at [facility].  3. HTN (hypertension):   Patient hypotensive earlier this admission. Resolved. Will start low dose metoprolol.  4. Hyperlipidemia on lipitor.    Ethan Peters 01/15/2013 8:25 AM

## 2013-01-16 ENCOUNTER — Telehealth: Payer: Self-pay | Admitting: Cardiology

## 2013-01-16 NOTE — Telephone Encounter (Signed)
Attempted to contact patient at work and unable to get through to an individual after trying several extensions.  Patient's name is not in [employer].

## 2013-01-16 NOTE — Telephone Encounter (Signed)
TCM patient. Home phone disconnected. Can't get thru on work phone.

## 2013-01-21 ENCOUNTER — Inpatient Hospital Stay (HOSPITAL_COMMUNITY)
Admission: AD | Admit: 2013-01-21 | Discharge: 2013-01-22 | DRG: 287 | Disposition: A | Discharge status: Home / Self Care | Payer: Medicaid Other | Source: Ambulatory Visit | Attending: Internal Medicine | Admitting: Internal Medicine

## 2013-01-21 ENCOUNTER — Encounter (HOSPITAL_COMMUNITY): Payer: Self-pay | Admitting: General Practice

## 2013-01-21 ENCOUNTER — Encounter: Payer: Self-pay | Admitting: Cardiology

## 2013-01-21 ENCOUNTER — Ambulatory Visit (INDEPENDENT_AMBULATORY_CARE_PROVIDER_SITE_OTHER): Payer: Medicaid Other | Admitting: Internal Medicine

## 2013-01-21 ENCOUNTER — Encounter: Payer: Self-pay | Admitting: Internal Medicine

## 2013-01-21 VITALS — BP 105/70 | HR 69

## 2013-01-21 DIAGNOSIS — Z7982 Long term (current) use of aspirin: Secondary | ICD-10-CM

## 2013-01-21 DIAGNOSIS — R079 Chest pain, unspecified: Secondary | ICD-10-CM

## 2013-01-21 DIAGNOSIS — E1159 Type 2 diabetes mellitus with other circulatory complications: Secondary | ICD-10-CM | POA: Diagnosis present

## 2013-01-21 DIAGNOSIS — Z880 Allergy status to penicillin: Secondary | ICD-10-CM

## 2013-01-21 DIAGNOSIS — I2589 Other forms of chronic ischemic heart disease: Secondary | ICD-10-CM | POA: Diagnosis present

## 2013-01-21 DIAGNOSIS — I10 Essential (primary) hypertension: Secondary | ICD-10-CM | POA: Diagnosis present

## 2013-01-21 DIAGNOSIS — Z79899 Other long term (current) drug therapy: Secondary | ICD-10-CM

## 2013-01-21 DIAGNOSIS — E1165 Type 2 diabetes mellitus with hyperglycemia: Secondary | ICD-10-CM

## 2013-01-21 DIAGNOSIS — I2510 Atherosclerotic heart disease of native coronary artery without angina pectoris: Secondary | ICD-10-CM | POA: Diagnosis present

## 2013-01-21 DIAGNOSIS — I255 Ischemic cardiomyopathy: Secondary | ICD-10-CM | POA: Diagnosis present

## 2013-01-21 DIAGNOSIS — R072 Precordial pain: Secondary | ICD-10-CM

## 2013-01-21 DIAGNOSIS — Z794 Long term (current) use of insulin: Secondary | ICD-10-CM

## 2013-01-21 DIAGNOSIS — E119 Type 2 diabetes mellitus without complications: Secondary | ICD-10-CM | POA: Diagnosis present

## 2013-01-21 DIAGNOSIS — I200 Unstable angina: Secondary | ICD-10-CM | POA: Diagnosis present

## 2013-01-21 DIAGNOSIS — Z9861 Coronary angioplasty status: Secondary | ICD-10-CM

## 2013-01-21 DIAGNOSIS — I252 Old myocardial infarction: Secondary | ICD-10-CM

## 2013-01-21 HISTORY — DX: Acute myocardial infarction, unspecified: I21.9

## 2013-01-21 HISTORY — DX: Shortness of breath: R06.02

## 2013-01-21 HISTORY — DX: Gout, unspecified: M10.9

## 2013-01-21 LAB — BASIC METABOLIC PANEL
BUN: 19 mg/dL (ref 6–23)
Calcium: 9.2 mg/dL (ref 8.4–10.5)
Creatinine, Ser: 1.12 mg/dL (ref 0.50–1.35)
GFR calc Af Amer: 82 mL/min — ABNORMAL LOW (ref >90)
GFR calc non Af Amer: 71 mL/min — ABNORMAL LOW (ref >90)

## 2013-01-21 LAB — CBC WITH DIFFERENTIAL/PLATELET
Basophils Relative: 1 % (ref 0–1)
Eosinophils Absolute: 0.1 10*3/uL (ref 0.0–0.7)
HCT: 36.1 % — ABNORMAL LOW (ref 39.0–52.0)
Hemoglobin: 12.4 g/dL — ABNORMAL LOW (ref 13.0–17.0)
MCH: 30.2 pg (ref 26.0–34.0)
MCHC: 34.3 g/dL (ref 30.0–36.0)
Monocytes Absolute: 0.4 10*3/uL (ref 0.1–1.0)
Monocytes Relative: 5 % (ref 3–12)
RDW: 11.9 % (ref 11.5–15.5)

## 2013-01-21 LAB — TROPONIN I
Troponin I: <0.3 ng/mL (ref <0.30)
Troponin I: 0.32 ng/mL (ref <0.30)

## 2013-01-21 LAB — PROTIME-INR: INR: 1.16 (ref 0.00–1.49)

## 2013-01-21 MED ORDER — ALLOPURINOL 300 MG PO TABS
300.0000 mg | ORAL_TABLET | Freq: Every day | ORAL | Status: DC | PRN
  Filled 2013-01-21: qty 1

## 2013-01-21 MED ORDER — NITROGLYCERIN 0.4 MG SL SUBL: 0.4000 mg | SUBLINGUAL_TABLET | SUBLINGUAL | Status: DC | PRN

## 2013-01-21 MED ORDER — OMEGA-3-ACID ETHYL ESTERS 1 G PO CAPS
1.0000 g | ORAL_CAPSULE | Freq: Two times a day (BID) | ORAL | Status: DC
  Administered 2013-01-21 – 2013-01-22 (×2): 1 g via ORAL
  Filled 2013-01-21 (×3): qty 1

## 2013-01-21 MED ORDER — TICAGRELOR 90 MG PO TABS
90.0000 mg | ORAL_TABLET | Freq: Two times a day (BID) | ORAL | Status: DC
  Administered 2013-01-21 – 2013-01-22 (×2): 90 mg via ORAL
  Filled 2013-01-21 (×3): qty 1

## 2013-01-21 MED ORDER — ASPIRIN EC 81 MG PO TBEC
81.0000 mg | DELAYED_RELEASE_TABLET | Freq: Every day | ORAL | Status: DC
  Filled 2013-01-21: qty 1

## 2013-01-21 MED ORDER — INSULIN ASPART PROT & ASPART (70-30 MIX) 100 UNIT/ML ~~LOC~~ SUSP
10.0000 [IU] | Freq: Two times a day (BID) | SUBCUTANEOUS | Status: DC
  Filled 2013-01-21: qty 10

## 2013-01-21 MED ORDER — ACETAMINOPHEN 325 MG PO TABS: 650.0000 mg | ORAL_TABLET | ORAL | Status: DC | PRN

## 2013-01-21 MED ORDER — ENOXAPARIN SODIUM 80 MG/0.8ML ~~LOC~~ SOLN
1.0000 mg/kg | Freq: Two times a day (BID) | SUBCUTANEOUS | Status: DC
  Administered 2013-01-21: 70 mg via SUBCUTANEOUS
  Filled 2013-01-21 (×4): qty 0.8

## 2013-01-21 MED ORDER — ALPRAZOLAM 0.25 MG PO TABS: 0.2500 mg | ORAL_TABLET | Freq: Three times a day (TID) | ORAL | Status: DC | PRN

## 2013-01-21 MED ORDER — ASPIRIN 300 MG RE SUPP
300.0000 mg | RECTAL | Status: DC
  Filled 2013-01-21: qty 1

## 2013-01-21 MED ORDER — ASPIRIN 81 MG PO CHEW
162.0000 mg | CHEWABLE_TABLET | ORAL | Status: AC
  Administered 2013-01-21: 162 mg via ORAL

## 2013-01-21 MED ORDER — TICAGRELOR 90 MG PO TABS
90.0000 mg | ORAL_TABLET | Freq: Two times a day (BID) | ORAL | Status: DC
Start: 2013-01-21 — End: 2013-01-21
  Filled 2013-01-21: qty 1

## 2013-01-21 MED ORDER — ASPIRIN 81 MG PO CHEW
324.0000 mg | CHEWABLE_TABLET | ORAL | Status: DC
  Filled 2013-01-21: qty 4

## 2013-01-21 MED ORDER — SODIUM CHLORIDE 0.9 % IJ SOLN: 3.0000 mL | INTRAMUSCULAR | Status: DC | PRN

## 2013-01-21 MED ORDER — ATORVASTATIN CALCIUM 80 MG PO TABS
80.0000 mg | ORAL_TABLET | Freq: Every day | ORAL | Status: DC
  Administered 2013-01-21: 80 mg via ORAL
  Filled 2013-01-21 (×2): qty 1

## 2013-01-21 MED ORDER — SODIUM CHLORIDE 0.9 % IV SOLN: 250.0000 mL | INTRAVENOUS | Status: DC | PRN

## 2013-01-21 MED ORDER — ONDANSETRON HCL 4 MG/2ML IJ SOLN: 4.0000 mg | Freq: Four times a day (QID) | INTRAMUSCULAR | Status: DC | PRN

## 2013-01-21 MED ORDER — METOPROLOL TARTRATE 12.5 MG HALF TABLET
12.5000 mg | ORAL_TABLET | Freq: Two times a day (BID) | ORAL | Status: DC
  Administered 2013-01-21: 12.5 mg via ORAL
  Filled 2013-01-21 (×3): qty 1

## 2013-01-21 MED ORDER — SODIUM CHLORIDE 0.9 % IJ SOLN
3.0000 mL | Freq: Two times a day (BID) | INTRAMUSCULAR | Status: DC
  Administered 2013-01-22: 3 mL via INTRAVENOUS

## 2013-01-21 MED ORDER — ASPIRIN EC 81 MG PO TBEC
81.0000 mg | DELAYED_RELEASE_TABLET | Freq: Every day | ORAL | Status: DC
Start: 2013-01-22 — End: 2013-01-22
  Administered 2013-01-22: 81 mg via ORAL
  Filled 2013-01-21: qty 1

## 2013-01-21 NOTE — Progress Notes (Signed)
ANTICOAGULATION CONSULT NOTE - Initial Consult  Pharmacy Consult for lovenox Indication: chest pain/ACS  Allergies  Allergen Reactions  . Penicillins Rash    Lip swelling    Patient Measurements: Height: 5\' 6"  (167.6 cm) Weight: 158 lb 8 oz (71.895 kg) IBW/kg (Calculated) : 63.8   Vital Signs: Temp: 97.5 F (36.4 C) (04/09 1500) Temp src: Oral (04/09 1500) BP: 106/73 mmHg (04/09 1500) Pulse Rate: 71 (04/09 1500)  Labs: No results found for this basename: HGB, HCT, PLT, APTT, LABPROT, INR, HEPARINUNFRC, CREATININE, CKTOTAL, CKMB, TROPONINI,  in the last 72 hours  Estimated Creatinine Clearance: 84.5 ml/min (by C-G formula based on Cr of 0.87).   Medical History: Past Medical History  Diagnosis Date  . Hypertension   . Diabetes mellitus     Uncontrolled, Hgb A1C 14.8% on 03/14, started on insulin  . CAD (coronary artery disease) 01/09/13    Inferior STEMI s/p DES-RCA  . Ischemic cardiomyopathy     EF 45-50%, grade 1 diastolic dysfunction, mildly dilated RV, RA at the upper limits of normal, mild TR, PA systolic pressure 32 mm mercury and hypokinesis to akinesis of the basal mid inferior myocardium   Assessment: 57 year old male presenting to [facility] with continued chest pain. Patient was a STEMI on 3/28 and had recurrent chest even up to discharge last admission. Cardiac enzymes are currently pending. Orders to start lovenox and plan for cath in am 4/10.  Goal of Therapy:  Anti-Xa level 0.6-1.2 units/ml 4hrs after LMWH dose given Monitor platelets by anticoagulation protocol: Yes   Plan:  Lovenox 70mg  sq q 12 hours  Cath in am 4/10  Sheppard Coil PharmD., BCPS Clinical Pharmacist Pager [phone] 01/21/2013 5:30 PM

## 2013-01-21 NOTE — Progress Notes (Signed)
 Primary Care Physician: none   Ethan Peters is a 57 y.o. male with a h/o HTN, DM, and CAD s/p urgent PCI to the RCA for inferior STEMI 01/09/13.  Per Dr Jordan, he continued to have some SOB and chest discomfort post cath and even at time of discharge.  The patient reports doing well with resolution of pain initially.  Today, he began having recurrent pain around noon.  He describes 5/10 chest pain, less severe than with his MI and radiating around his L side.  He also reports SOB.   Today, he denies symptoms of palpitations,  orthopnea, PND, lower extremity edema, dizziness, presyncope, syncope, or neurologic sequela. The patient is tolerating medications without difficulties and is otherwise without complaint today.   Past Medical History  Diagnosis Date  . Hypertension   . Diabetes mellitus     Uncontrolled, Hgb A1C 14.8% on 03/14, started on insulin  . CAD (coronary artery disease) 01/09/13    Inferior STEMI s/p DES-RCA  . Ischemic cardiomyopathy     EF 45-50%, grade 1 diastolic dysfunction, mildly dilated RV, RA at the upper limits of normal, mild TR, PA systolic pressure 32 mm mercury and hypokinesis to akinesis of the basal mid inferior myocardium   Past Surgical History  Procedure Laterality Date  . Coronary angioplasty with stent placement  01/09/2013    30% pLAD, 30% mLAD, 70% pLCx, RCA occlusion at crux with R->L distal collaterals s/p DES; LVEF 50%, moderate-severe HK of inferior basal wall    Current Outpatient Prescriptions  Medication Sig Dispense Refill  . allopurinol (ZYLOPRIM) 300 MG tablet Take 300 mg by mouth daily as needed (gout pain).      . ALPRAZolam (XANAX) 0.25 MG tablet Take 1 tablet (0.25 mg total) by mouth 3 (three) times daily as needed for anxiety.  30 tablet  3  . aspirin EC 81 MG EC tablet Take 1 tablet (81 mg total) by mouth daily.      . atorvastatin (LIPITOR) 80 MG tablet Take 1 tablet (80 mg total) by mouth daily at 6 PM.  30 tablet  3  . b  complex vitamins tablet Take 1 tablet by mouth 3 (three) times a week.      . GLUCOSAMINE PO Take 1 capsule by mouth daily.      . insulin NPH-regular (RELION 70/30) (70-30) 100 UNIT/ML injection Inject 10 Units into the skin 2 (two) times daily with a meal. 10 units at breakfast. 10 units at supper.  10 mL  12  . metFORMIN (GLUMETZA) 1000 MG (MOD) 24 hr tablet Take 1 tablet (1,000 mg total) by mouth 2 (two) times daily with a meal.  30 tablet  3  . metoprolol tartrate (LOPRESSOR) 12.5 mg TABS Take 0.5 tablets (12.5 mg total) by mouth 2 (two) times daily.  30 tablet  3  . Multiple Vitamin (MULTIVITAMIN WITH MINERALS) TABS Take 1 tablet by mouth daily.      . nitroGLYCERIN (NITROSTAT) 0.4 MG SL tablet Place 1 tablet (0.4 mg total) under the tongue every 5 (five) minutes x 3 doses as needed for chest pain.  25 tablet  3  . Omega-3 Fatty Acids (FISH OIL) 1000 MG CAPS Take 2,000 mg by mouth daily.      . Ticagrelor (BRILINTA) 90 MG TABS tablet Take 1 tablet (90 mg total) by mouth 2 (two) times daily.  60 tablet  3   No current facility-administered medications for this visit.    Allergies    Allergen Reactions  . Penicillins Rash    Lip swelling    History   Social History  . Marital Status: Married    Spouse Name: N/A    Number of Children: N/A  . Years of Education: N/A   Occupational History  . Not on file.   Social History Main Topics  . Smoking status: Never Smoker   . Smokeless tobacco: Not on file  . Alcohol Use: Yes  . Drug Use: No  . Sexually Active: Not on file   Other Topics Concern  . Not on file   Social History Narrative   Spanish-speaking. Limited English.     ROS- All systems are reviewed and negative except as per the HPI above  Physical Exam: Filed Vitals:   01/21/13 1447  BP: 105/70  Pulse: 69    GEN- The patient is well appearing, alert and oriented x 3 today.   Head- normocephalic, atraumatic Eyes-  Sclera clear, conjunctiva pink Ears- hearing  intact Oropharynx- clear Neck- supple, no JVP Lymph- no cervical lymphadenopathy Lungs- Clear to ausculation bilaterally, normal work of breathing Heart- Regular rate and rhythm, no murmurs, rubs or gallops, PMI not laterally displaced GI- soft, NT, ND, + BS Extremities- no clubbing, cyanosis, or edema MS- no significant deformity or atrophy Skin- no rash or lesion Psych- euthymic mood, full affect Neuro- strength and sensation are intact  EKG today reveals sinus rhythm, inferior infarction  Assessment and Plan:  1. Chest pain/ recent MI Given recent MI with inferior STEMI requiring PCI and new return of pain, I think that the patient warrants relook cath.  I have spoken at length with Dr Jordan today and we have reviewed the patients ekgs today.  We agree with overnight observation and cath in the am. Risks, benefits, and alternatives to the procedure were discussed at length with the patient who wishes to proceed.  2. DM BS elevated in the hospital.  Will need to follow acchuchecks closely.   

## 2013-01-21 NOTE — Patient Instructions (Signed)
Admit 3 [facility] Tomorrow Dr Copper

## 2013-01-21 NOTE — H&P (Signed)
Primary Care Physician: none   Ethan Peters is a 57 y.o. male with a h/o HTN, DM, and CAD s/p urgent PCI to the RCA for inferior STEMI 01/09/13.  Per Dr [place], he continued to have some SOB and chest discomfort post cath and even at time of discharge.  The patient reports doing well with resolution of pain initially.  Today, he began having recurrent pain around noon.  He describes 5/10 chest pain, less severe than with his MI and radiating around his L side.  He also reports SOB.   Today, he denies symptoms of palpitations,  orthopnea, PND, lower extremity edema, dizziness, presyncope, syncope, or neurologic sequela. The patient is tolerating medications without difficulties and is otherwise without complaint today.   Past Medical History  Diagnosis Date  . Hypertension   . Diabetes mellitus     Uncontrolled, Hgb A1C 14.8% on 03/14, started on insulin  . CAD (coronary artery disease) 01/09/13    Inferior STEMI s/p DES-RCA  . Ischemic cardiomyopathy     EF 45-50%, grade 1 diastolic dysfunction, mildly dilated RV, RA at the upper limits of normal, mild TR, PA systolic pressure 32 mm mercury and hypokinesis to akinesis of the basal mid inferior myocardium   Past Surgical History  Procedure Laterality Date  . Coronary angioplasty with stent placement  01/09/2013    30% pLAD, 30% mLAD, 70% pLCx, RCA occlusion at crux with R->L distal collaterals s/p DES; LVEF 50%, moderate-severe HK of inferior basal wall    Current Outpatient Prescriptions  Medication Sig Dispense Refill  . allopurinol (ZYLOPRIM) 300 MG tablet Take 300 mg by mouth daily as needed (gout pain).      Marland Kitchen ALPRAZolam (XANAX) 0.25 MG tablet Take 1 tablet (0.25 mg total) by mouth 3 (three) times daily as needed for anxiety.  30 tablet  3  . aspirin EC 81 MG EC tablet Take 1 tablet (81 mg total) by mouth daily.      Marland Kitchen atorvastatin (LIPITOR) 80 MG tablet Take 1 tablet (80 mg total) by mouth daily at 6 PM.  30 tablet  3  . b  complex vitamins tablet Take 1 tablet by mouth 3 (three) times a week.      Marland Kitchen GLUCOSAMINE PO Take 1 capsule by mouth daily.      . insulin NPH-regular (RELION 70/30) (70-30) 100 UNIT/ML injection Inject 10 Units into the skin 2 (two) times daily with a meal. 10 units at breakfast. 10 units at supper.  10 mL  12  . metFORMIN (GLUMETZA) 1000 MG (MOD) 24 hr tablet Take 1 tablet (1,000 mg total) by mouth 2 (two) times daily with a meal.  30 tablet  3  . metoprolol tartrate (LOPRESSOR) 12.5 mg TABS Take 0.5 tablets (12.5 mg total) by mouth 2 (two) times daily.  30 tablet  3  . Multiple Vitamin (MULTIVITAMIN WITH MINERALS) TABS Take 1 tablet by mouth daily.      . nitroGLYCERIN (NITROSTAT) 0.4 MG SL tablet Place 1 tablet (0.4 mg total) under the tongue every 5 (five) minutes x 3 doses as needed for chest pain.  25 tablet  3  . Omega-3 Fatty Acids (FISH OIL) 1000 MG CAPS Take 2,000 mg by mouth daily.      . Ticagrelor (BRILINTA) 90 MG TABS tablet Take 1 tablet (90 mg total) by mouth 2 (two) times daily.  60 tablet  3   No current facility-administered medications for this visit.    Allergies  Allergen Reactions  . Penicillins Rash    Lip swelling    History   Social History  . Marital Status: Married    Spouse Name: N/A    Number of Children: N/A  . Years of Education: N/A   Occupational History  . Not on file.   Social History Main Topics  . Smoking status: Never Smoker   . Smokeless tobacco: Not on file  . Alcohol Use: Yes  . Drug Use: No  . Sexually Active: Not on file   Other Topics Concern  . Not on file   Social History Narrative   Spanish-speaking. Limited English.     ROS- All systems are reviewed and negative except as per the HPI above  Physical Exam: Filed Vitals:   01/21/13 1447  BP: 105/70  Pulse: 69    GEN- The patient is well appearing, alert and oriented x 3 today.   Head- normocephalic, atraumatic Eyes-  Sclera clear, conjunctiva pink Ears- hearing  intact Oropharynx- clear Neck- supple, no JVP Lymph- no cervical lymphadenopathy Lungs- Clear to ausculation bilaterally, normal work of breathing Heart- Regular rate and rhythm, no murmurs, rubs or gallops, PMI not laterally displaced GI- soft, NT, ND, + BS Extremities- no clubbing, cyanosis, or edema MS- no significant deformity or atrophy Skin- no rash or lesion Psych- euthymic mood, full affect Neuro- strength and sensation are intact  EKG today reveals sinus rhythm, inferior infarction  Assessment and Plan:  1. Chest pain/ recent MI Given recent MI with inferior STEMI requiring PCI and new return of pain, I think that the patient warrants relook cath.  I have spoken at length with Dr [place] today and we have reviewed the patients ekgs today.  We agree with overnight observation and cath in the am. Risks, benefits, and alternatives to the procedure were discussed at length with the patient who wishes to proceed.  2. DM BS elevated in the hospital.  Will need to follow acchuchecks closely.

## 2013-01-22 ENCOUNTER — Encounter (HOSPITAL_COMMUNITY)
Admission: AD | Disposition: A | Discharge status: Home / Self Care | Payer: Self-pay | Source: Ambulatory Visit | Attending: Internal Medicine

## 2013-01-22 ENCOUNTER — Ambulatory Visit (HOSPITAL_COMMUNITY): Admission: RE | Admit: 2013-01-22 | Payer: MEDICAID | Source: Ambulatory Visit | Admitting: Cardiovascular Disease

## 2013-01-22 ENCOUNTER — Encounter (HOSPITAL_COMMUNITY): Payer: Self-pay | Admitting: Physician Assistant

## 2013-01-22 DIAGNOSIS — R072 Precordial pain: Secondary | ICD-10-CM

## 2013-01-22 DIAGNOSIS — I10 Essential (primary) hypertension: Secondary | ICD-10-CM

## 2013-01-22 DIAGNOSIS — I249 Acute ischemic heart disease, unspecified: Secondary | ICD-10-CM | POA: Insufficient documentation

## 2013-01-22 DIAGNOSIS — I2510 Atherosclerotic heart disease of native coronary artery without angina pectoris: Principal | ICD-10-CM

## 2013-01-22 DIAGNOSIS — E119 Type 2 diabetes mellitus without complications: Secondary | ICD-10-CM

## 2013-01-22 HISTORY — PX: LEFT HEART CATHETERIZATION WITH CORONARY ANGIOGRAM: SHX5451

## 2013-01-22 HISTORY — PX: CARDIAC CATHETERIZATION: SHX172

## 2013-01-22 LAB — TROPONIN I: Troponin I: <0.3 ng/mL (ref <0.30)

## 2013-01-22 LAB — GLUCOSE, CAPILLARY
Glucose-Capillary: 122 mg/dL — ABNORMAL HIGH (ref 70–99)
Glucose-Capillary: 114 mg/dL — ABNORMAL HIGH (ref 70–99)

## 2013-01-22 SURGERY — LEFT HEART CATHETERIZATION WITH CORONARY ANGIOGRAM
Anesthesia: LOCAL

## 2013-01-22 MED ORDER — VERAPAMIL HCL 2.5 MG/ML IV SOLN
INTRAVENOUS | Status: AC
  Filled 2013-01-22: qty 2

## 2013-01-22 MED ORDER — FENTANYL CITRATE 0.05 MG/ML IJ SOLN
INTRAMUSCULAR | Status: AC
  Filled 2013-01-22: qty 2

## 2013-01-22 MED ORDER — MIDAZOLAM HCL 2 MG/2ML IJ SOLN
INTRAMUSCULAR | Status: AC
  Filled 2013-01-22: qty 2

## 2013-01-22 MED ORDER — SODIUM CHLORIDE 0.9 % IJ SOLN: 3.0000 mL | INTRAMUSCULAR | Status: DC | PRN

## 2013-01-22 MED ORDER — SODIUM CHLORIDE 0.9 % IV SOLN: 250.0000 mL | INTRAVENOUS | Status: DC | PRN

## 2013-01-22 MED ORDER — NITROGLYCERIN 1 MG/10 ML FOR IR/CATH LAB
INTRA_ARTERIAL | Status: AC
  Filled 2013-01-22: qty 10

## 2013-01-22 MED ORDER — SODIUM CHLORIDE 0.9 % IV SOLN: INTRAVENOUS | Status: DC

## 2013-01-22 MED ORDER — SODIUM CHLORIDE 0.9 % IJ SOLN
3.0000 mL | Freq: Two times a day (BID) | INTRAMUSCULAR | Status: DC
Start: 2013-01-22 — End: 2013-01-22
  Administered 2013-01-22: 3 mL via INTRAVENOUS

## 2013-01-22 MED ORDER — HEPARIN (PORCINE) IN NACL 2-0.9 UNIT/ML-% IJ SOLN
INTRAMUSCULAR | Status: AC
  Filled 2013-01-22: qty 1000

## 2013-01-22 MED ORDER — LIDOCAINE HCL (PF) 1 % IJ SOLN
INTRAMUSCULAR | Status: AC
  Filled 2013-01-22: qty 30

## 2013-01-22 MED ORDER — METFORMIN HCL ER (MOD) 1000 MG PO TB24: 1000.0000 mg | ORAL_TABLET | Freq: Two times a day (BID) | ORAL | Status: DC

## 2013-01-22 MED ORDER — HEPARIN SODIUM (PORCINE) 1000 UNIT/ML IJ SOLN
INTRAMUSCULAR | Status: AC
  Filled 2013-01-22: qty 1

## 2013-01-22 NOTE — H&P (View-Only) (Signed)
   TELEMETRY: Reviewed telemetry pt in NSR: Filed Vitals:   01/21/13 1500 01/22/13 0500  BP: 106/73 95/65  Pulse: 71 61  Temp: 97.5 F (36.4 C) 98.1 F (36.7 C)  TempSrc: Oral   Resp: 18 16  Height: 5' 6" (1.676 m)   Weight: 158 lb 8 oz (71.895 kg)   SpO2: 99% 96%    Intake/Output Summary (Last 24 hours) at 01/22/13 0829 Last data filed at 01/21/13 1800  Gross per 24 hour  Intake    360 ml  Output      0 ml  Net    360 ml    SUBJECTIVE Still has some chest pain with a deep breath. Yesterday pain was constant. No SOB.  LABS: Basic Metabolic Panel:  Recent Labs  01/21/13 1710  NA 138  K 4.8  CL 105  CO2 27  GLUCOSE 99  BUN 19  CREATININE 1.12  CALCIUM 9.2   Liver Function Tests: No results found for this basename: AST, ALT, ALKPHOS, BILITOT, PROT, ALBUMIN,  in the last 72 hours No results found for this basename: LIPASE, AMYLASE,  in the last 72 hours CBC:  Recent Labs  01/21/13 1710  WBC 7.8  NEUTROABS 5.0  HGB 12.4*  HCT 36.1*  MCV 88.0  PLT 313   Cardiac Enzymes:  Recent Labs  01/21/13 1711 01/21/13 2222 01/22/13 0423  TROPONINI <0.30 0.32* <0.30    Radiology/Studies:   ECG: pending today.  PHYSICAL EXAM General: Well developed, well nourished, in no acute distress. Head: Normocephalic, atraumatic, sclera non-icteric, no xanthomas, nares are without discharge. Neck: Negative for carotid bruits. JVD not elevated. Lungs: Clear bilaterally to auscultation without wheezes, rales, or rhonchi. Breathing is unlabored. Heart: RRR S1 S2 without murmurs, rubs, or gallops.  Abdomen: Soft, non-tender, non-distended with normoactive bowel sounds.  No rebound/guarding. No obvious abdominal masses. Msk:  Strength and tone appears normal for age. Extremities: No clubbing, cyanosis or edema.  Distal pedal pulses are 2+ and equal bilaterally. Neuro: Alert and oriented X 3. Moves all extremities spontaneously. Psych:  Responds to questions appropriately  with a normal affect.  ASSESSMENT AND PLAN: 1. Acute chest pain. S/p emergent PCI of RCA 01/09/13 for inferior STEMI with DES. Patient has had persistent chest pain since PCI. Pain worsened on Sunday. Pain better today. 2nd of 3 troponins mildly elevated of uncertain significance. Recommend repeat cardiac cath to ascertain stent patency. Patient agreeable.   2. HTN controlled.  3. DM type 2. On oral therapy. Glycemic control improved.  Active Problems:   HTN (hypertension)   CAD (coronary artery disease), native coronary artery   Cardiomyopathy, ischemic   ACS (acute coronary syndrome)    Signed, Destynee Stringfellow MD,FACC 01/22/2013 8:29 AM    

## 2013-01-22 NOTE — Progress Notes (Signed)
[facility]  Patient Status: Active (receiving services up to time of hospitalization)  [facility] is providing the following services: RN and MSW  If patient discharges after hours, please call [phone].   Ethan Peters 01/22/2013, 10:52 AM

## 2013-01-22 NOTE — Progress Notes (Signed)
   TELEMETRY: Reviewed telemetry pt in NSR: Filed Vitals:   01/21/13 1500 01/22/13 0500  BP: 106/73 95/65  Pulse: 71 61  Temp: 97.5 F (36.4 C) 98.1 F (36.7 C)  TempSrc: Oral   Resp: 18 16  Height: 5\' 6"  (1.676 m)   Weight: 158 lb 8 oz (71.895 kg)   SpO2: 99% 96%    Intake/Output Summary (Last 24 hours) at 01/22/13 0829 Last data filed at 01/21/13 1800  Gross per 24 hour  Intake    360 ml  Output      0 ml  Net    360 ml    SUBJECTIVE Still has some chest pain with a deep breath. Yesterday pain was constant. No SOB.  LABS: Basic Metabolic Panel:  Recent Labs  16/10/96 1710  NA 138  K 4.8  CL 105  CO2 27  GLUCOSE 99  BUN 19  CREATININE 1.12  CALCIUM 9.2   Liver Function Tests: No results found for this basename: AST, ALT, ALKPHOS, BILITOT, PROT, ALBUMIN,  in the last 72 hours No results found for this basename: LIPASE, AMYLASE,  in the last 72 hours CBC:  Recent Labs  01/21/13 1710  WBC 7.8  NEUTROABS 5.0  HGB 12.4*  HCT 36.1*  MCV 88.0  PLT 313   Cardiac Enzymes:  Recent Labs  01/21/13 1711 01/21/13 2222 01/22/13 0423  TROPONINI <0.30 0.32* <0.30    Radiology/Studies:   ECG: pending today.  PHYSICAL EXAM General: Well developed, well nourished, in no acute distress. Head: Normocephalic, atraumatic, sclera non-icteric, no xanthomas, nares are without discharge. Neck: Negative for carotid bruits. JVD not elevated. Lungs: Clear bilaterally to auscultation without wheezes, rales, or rhonchi. Breathing is unlabored. Heart: RRR S1 S2 without murmurs, rubs, or gallops.  Abdomen: Soft, non-tender, non-distended with normoactive bowel sounds.  No rebound/guarding. No obvious abdominal masses. Msk:  Strength and tone appears normal for age. Extremities: No clubbing, cyanosis or edema.  Distal pedal pulses are 2+ and equal bilaterally. Neuro: Alert and oriented X 3. Moves all extremities spontaneously. Psych:  Responds to questions appropriately  with a normal affect.  ASSESSMENT AND PLAN: 1. Acute chest pain. S/p emergent PCI of RCA 01/09/13 for inferior STEMI with DES. Patient has had persistent chest pain since PCI. Pain worsened on Sunday. Pain better today. 2nd of 3 troponins mildly elevated of uncertain significance. Recommend repeat cardiac cath to ascertain stent patency. Patient agreeable.   2. HTN controlled.  3. DM type 2. On oral therapy. Glycemic control improved.  Active Problems:   HTN (hypertension)   CAD (coronary artery disease), native coronary artery   Cardiomyopathy, ischemic   ACS (acute coronary syndrome)    Signed, Peter Swaziland MD,FACC 01/22/2013 8:29 AM

## 2013-01-22 NOTE — Progress Notes (Signed)
Utilization review completed. Julie-Anne Torain, RN, BSN. 

## 2013-01-22 NOTE — CV Procedure (Signed)
   Cardiac Catheterization Procedure Note  Name: Ethan Peters MRN: [id number] DOB: 1956-05-08  Procedure: Left Heart Cath, Selective Coronary Angiography, LV angiography  Indication: Recurrent chest pain after recent inferior wall myocardial infarction. He should with known moderate diffuse coronary stenoses. Cardiac catheterization was recommended to confirm stent patency.   Procedural Details: The right wrist was prepped, draped, and anesthetized with 1% lidocaine. Using the modified Seldinger technique, a 5 French sheath was introduced into the right radial artery. 3 mg of verapamil was administered through the sheath, weight-based unfractionated heparin was administered intravenously. Standard Judkins catheters were used for selective coronary angiography and left ventriculography. Catheter exchanges were performed over an exchange length guidewire. There were no immediate procedural complications. A TR band was used for radial hemostasis at the completion of the procedure.  The patient was transferred to the post catheterization recovery area for further monitoring.  Procedural Findings: Hemodynamics: AO 91/62 with a mean of 77 LV 96/8  Coronary angiography: Coronary dominance: right  Left mainstem: The left main is patent with no obstructive disease. Divides into the LAD and left circumflex.  Left anterior descending (LAD): The LAD is patent throughout. The vessel has mild calcification. There is 50% proximal LAD stenosis. The first diagonal is of moderate caliber with diffuse 50% stenosis throughout its proximal aspect. The mid LAD beyond the diagonal has 50% focal eccentric stenosis. Further down in the mid and distal LAD the vessel is widely patent and it wraps around the left ventricular apex.  Left circumflex (LCx): The left circumflex has 40-50% ostial stenosis. The proximal circumflex is widely patent. The first obtuse marginal gives off multiple subbranches and it is a large  caliber vessel. There is long segment 75% stenosis throughout the first OM. Distally it is about a 3 mm vessel. The AV groove circumflex beyond the obtuse marginal is very small in caliber.  Right coronary artery (RCA): The RCA is a large, dominant vessel. The vessel is diffusely diseased. The proximal vessel has scattered irregularity with mild 20-30% stenosis. The stented segment in the distal vessel is patent. The mid vessel is patent. The PDA and posterolateral branches are large and they are patent throughout. There is mild diffuse irregularity noted.  Left ventriculography: Left ventricular systolic function is abnormal. There is severe hypokinesis of the basal and midinferior wall. The apical inferior wall and anterior wall contract normally. The estimated left ventricular ejection fraction is 45%.  Final Conclusions:   1. Continued patency of the stented segment the right coronary artery 2. Moderately severe diffuse stenosis of the first OM branch of the circumflex 3. Moderate stenosis of the LAD 4. Mild to moderate segmental left ventricular systolic dysfunction  Recommendations: The patient's coronary anatomy is stable. He does not have any critical stenoses. He is having resting chest pain which is somewhat atypical. He has diffuse diabetic pattern coronary artery disease and I have discussed his case with Dr. Swaziland. We agree that medical therapy is indicated. If he continues to have chest discomfort, a stress Myoview could be considered for further risk stratification. He will be discharged home later today.  Tonny Bollman 01/22/2013, 10:28 AM

## 2013-01-22 NOTE — Discharge Summary (Signed)
Discharge Summary   Patient ID: Ethan Peters,  MRN: [id number], DOB/AGE: 58-Aug-1957 58 y.o.  Admit date: 01/21/2013 Discharge date: 01/22/2013  Primary Physician: Default, Provider, MD Primary Cardiologist: New- followed by Dr. Swaziland this admission  Discharge Diagnoses Principal Problem:   Precordial pain Active Problems:   CAD (coronary artery disease), native coronary artery   Cardiomyopathy, ischemic   Poorly controlled type 2 diabetes mellitus   HTN (hypertension)  Allergies Allergies  Allergen Reactions  . Penicillins Rash    Lip swelling    Diagnostic Studies/Procedures  CARDIAC CATHETERIZATION - 01/22/13  Hemodynamics:  AO 91/62 with a mean of 77  LV 96/8  Coronary angiography:  Coronary dominance: right  Left mainstem: The left main is patent with no obstructive disease. Divides into the LAD and left circumflex.  Left anterior descending (LAD): The LAD is patent throughout. The vessel has mild calcification. There is 50% proximal LAD stenosis. The first diagonal is of moderate caliber with diffuse 50% stenosis throughout its proximal aspect. The mid LAD beyond the diagonal has 50% focal eccentric stenosis. Further down in the mid and distal LAD the vessel is widely patent and it wraps around the left ventricular apex.  Left circumflex (LCx): The left circumflex has 40-50% ostial stenosis. The proximal circumflex is widely patent. The first obtuse marginal gives off multiple subbranches and it is a large caliber vessel. There is long segment 75% stenosis throughout the first OM. Distally it is about a 3 mm vessel. The AV groove circumflex beyond the obtuse marginal is very small in caliber.  Right coronary artery (RCA): The RCA is a large, dominant vessel. The vessel is diffusely diseased. The proximal vessel has scattered irregularity with mild 20-30% stenosis. The stented segment in the distal vessel is patent. The mid vessel is patent. The PDA and posterolateral  branches are large and they are patent throughout. There is mild diffuse irregularity noted.  Left ventriculography: Left ventricular systolic function is abnormal. There is severe hypokinesis of the basal and midinferior wall. The apical inferior wall and anterior wall contract normally. The estimated left ventricular ejection fraction is 45%.  Final Conclusions:  1. Continued patency of the stented segment the right coronary artery  2. Moderately severe diffuse stenosis of the first OM branch of the circumflex  3. Moderate stenosis of the LAD  4. Mild to moderate segmental left ventricular systolic dysfunction   History of Present Illness  Ethan Peters is a 57 y.o. male who was admitted 01/21/13 with the above problem list.  He was recently discharged on 01/14/13 after experiencing an inferior STEMI status post DES-RCA. He had been doing well immediately post procedure, however began developing resting chest discomfort rated at a 5/10. This was described to Dr. Johney Frame on followup 01/21/13. He is a known diabetic and had notable residual CAD on initial cardiac catheterization. Given his recent history and development of resting chest discomfort, the plan was made to pursue relook diagnostic cardiac catheterization to exclude flow limiting CAD as an etiology to his chest pain. The plan was discussed with the patient who agreed to proceed. He was direct-admitted from the office on 01/21/13. He was resumed on all outpatient medications.   Hospital Course   He remained stable overnight. Initial troponin returned within normal limits. A subsequent results return mildly elevated in the third and final troponin returned again within normal limits. He remained n.p.o.  He was informed, consented and prepped for cardiac catheterization which is detailed in  full above. As noted, there is evidence of diabetic coronary artery disease with diffuse residual CAD. The plan was discussed between Drs. Excell Seltzer and [employer].  The decision was made to pursue medical therapy and consider outpatient stress Myoview to determine any evidence of ischemia should his chest pain persist or worsen. He was deemed stable for discharge post cath. We'll resume all outpatient medications. He will followup with Dr. Swaziland as noted below. This information, including activity restricted and post cath instructions, has been clearly outlined in the discharge AVS.   Discharge Vitals:  Blood pressure 127/87, pulse 85, temperature 98.2 F (36.8 C), temperature source Oral, resp. rate 16, height 5\' 6"  (1.676 m), weight 71.895 kg (158 lb 8 oz), SpO2 99.00%.   Labs: Recent Labs     01/21/13  1710  WBC  7.8  HGB  12.4*  HCT  36.1*  MCV  88.0  PLT  313    Recent Labs Lab 01/21/13 1710  NA 138  K 4.8  CL 105  CO2 27  BUN 19  CREATININE 1.12  CALCIUM 9.2  GLUCOSE 99   Recent Labs     01/21/13  1711  01/21/13  2222  01/22/13  0423  TROPONINI  <0.30  0.32*  <0.30   Disposition:  Discharge Orders   Future Appointments Provider Department Dept Phone   02/09/2013 2:45 PM Peter M Swaziland, MD [facility] [place]) [phone]   Future Orders Complete By Expires     Diet - low sodium heart healthy  As directed     Increase activity slowly  As directed           Follow-up Information   Follow up with Peter Swaziland, MD On 02/09/2013. (At 2:45 PM for follow-up.)    Contact information:   [address] [place] [address] [phone]      Discharge Medications:    Medication List    TAKE these medications       allopurinol 300 MG tablet  Commonly known as:  ZYLOPRIM  Take 300 mg by mouth daily as needed (gout pain).     ALPRAZolam 0.25 MG tablet  Commonly known as:  XANAX  Take 1 tablet (0.25 mg total) by mouth 3 (three) times daily as needed for anxiety.     aspirin 81 MG EC tablet  Take 1 tablet (81 mg total) by mouth daily.     atorvastatin 80 MG tablet  Commonly known as:   LIPITOR  Take 1 tablet (80 mg total) by mouth daily at 6 PM.     B-complex with vitamin C tablet  Take 1 tablet by mouth as needed (for vitamin deficiency).     Fish Oil 1000 MG Caps  Take 2,000 mg by mouth daily.     GLUCOSAMINE PO  Take 1 capsule by mouth daily.     insulin NPH-regular (70-30) 100 UNIT/ML injection  Commonly known as:  RELION 70/30  Inject 10 Units into the skin 2 (two) times daily with a meal. 10 units at breakfast. 10 units at supper.     metFORMIN 1000 MG (MOD) 24 hr tablet  Commonly known as:  GLUMETZA  Take 1 tablet (1,000 mg total) by mouth 2 (two) times daily with a meal.  Start taking on:  01/24/2013     metoprolol tartrate 12.5 mg Tabs  Commonly known as:  LOPRESSOR  Take 0.5 tablets (12.5 mg total) by mouth 2 (two) times daily.  multivitamin with minerals Tabs  Take 1 tablet by mouth daily.     nitroGLYCERIN 0.4 MG SL tablet  Commonly known as:  NITROSTAT  Place 1 tablet (0.4 mg total) under the tongue every 5 (five) minutes x 3 doses as needed for chest pain.     Ticagrelor 90 MG Tabs tablet  Commonly known as:  BRILINTA  Take 1 tablet (90 mg total) by mouth 2 (two) times daily.       Outstanding Labs/Studies: None  Duration of Discharge Encounter: Greater than 30 minutes including physician time.  Signed, R. Hurman Horn, PA-C 01/22/2013, 2:35 PM

## 2013-01-22 NOTE — Interval H&P Note (Signed)
History and Physical Interval Note:  01/22/2013 9:53 AM  Ethan Peters  has presented today for surgery, with the diagnosis of Chest pain  The various methods of treatment have been discussed with the patient and family. After consideration of risks, benefits and other options for treatment, the patient has consented to  Procedure(s): LEFT HEART CATHETERIZATION WITH CORONARY ANGIOGRAM (N/A) as a surgical intervention .  The patient's history has been reviewed, patient examined, no change in status, stable for surgery.  I have reviewed the patient's chart and labs.  Questions were answered to the patient's satisfaction.     Tonny Bollman

## 2013-01-22 NOTE — Progress Notes (Signed)
CRITICAL VALUE ALERT  Critical value received:  Troponin 0.32  Date of notification: 01/21/13  Critical value read back:yes  Nurse who received alert:  Jodene Nam RN  MD notified (1st page): Dr. Shirlee Latch  Responding MD: Dr. Shirlee Latch  No new orders. Will continue to monitor

## 2013-01-22 NOTE — Discharge Summary (Signed)
Patient seen and examined and history reviewed. Agree with above findings and plan. RCA stent is widely patent. Symptoms of chest pain are atypical. If chest pain persists may need to consider stress testing to see if he has significant ischemia in OM territory. I would favor medical management.  Thedora Hinders 01/22/2013 3:17 PM

## 2013-01-26 ENCOUNTER — Telehealth: Payer: Self-pay | Admitting: *Deleted

## 2013-01-26 NOTE — Telephone Encounter (Signed)
Patient Notified of appt date and time.  States he is doing good.  No concerns.

## 2013-01-26 NOTE — Telephone Encounter (Signed)
TCM Patient  Appt with Dr. Swaziland on 02/09/2013 @ 2:45.

## 2013-02-05 ENCOUNTER — Encounter: Payer: Self-pay | Admitting: Cardiology

## 2013-02-05 ENCOUNTER — Ambulatory Visit (INDEPENDENT_AMBULATORY_CARE_PROVIDER_SITE_OTHER): Payer: Medicaid Other | Admitting: Cardiology

## 2013-02-05 VITALS — BP 110/80 | HR 60 | Ht 66.0 in | Wt 171.1 lb

## 2013-02-05 DIAGNOSIS — E119 Type 2 diabetes mellitus without complications: Secondary | ICD-10-CM

## 2013-02-05 DIAGNOSIS — R0600 Dyspnea, unspecified: Secondary | ICD-10-CM

## 2013-02-05 DIAGNOSIS — E1165 Type 2 diabetes mellitus with hyperglycemia: Secondary | ICD-10-CM

## 2013-02-05 DIAGNOSIS — R072 Precordial pain: Secondary | ICD-10-CM

## 2013-02-05 DIAGNOSIS — I2510 Atherosclerotic heart disease of native coronary artery without angina pectoris: Secondary | ICD-10-CM

## 2013-02-05 DIAGNOSIS — R0609 Other forms of dyspnea: Secondary | ICD-10-CM

## 2013-02-05 MED ORDER — CLOPIDOGREL BISULFATE 75 MG PO TABS: 75.0000 mg | ORAL_TABLET | Freq: Every day | ORAL | Status: DC

## 2013-02-05 NOTE — Progress Notes (Signed)
Ethan Peters Date of Birth: [birth year] Medical Record #[id number]  History of Present Illness: Ethan Peters is seen for followup today. He is status post inferior STEMI on 01/09/2013. He had stenting of the RCA at the crux. He had persistent chest pain and dyspnea even after his infarct with atypical symptoms. He subsequently underwent repeat cardiac catheterization on April 10 which showed excellent patency of the stent in the RCA. He does have a long 70% stenosis in the left circumflex that will be treated medically. He still complains of some intermittent chest pain. He complains of shortness of breath which has been more of a persistent problem. He states that he loses his balance some. He has some nausea.  Current Outpatient Prescriptions on File Prior to Visit  Medication Sig Dispense Refill  . allopurinol (ZYLOPRIM) 300 MG tablet Take 300 mg by mouth daily as needed (gout pain).      Marland Kitchen ALPRAZolam (XANAX) 0.25 MG tablet Take 1 tablet (0.25 mg total) by mouth 3 (three) times daily as needed for anxiety.  30 tablet  3  . aspirin EC 81 MG EC tablet Take 1 tablet (81 mg total) by mouth daily.      . B Complex-C (B-COMPLEX WITH VITAMIN C) tablet Take 1 tablet by mouth as needed (for vitamin deficiency).      Marland Kitchen GLUCOSAMINE PO Take 1 capsule by mouth daily.      . insulin NPH-regular (RELION 70/30) (70-30) 100 UNIT/ML injection Inject 10 Units into the skin 2 (two) times daily with a meal. 10 units at breakfast. 10 units at supper.  10 mL  12  . metFORMIN (GLUMETZA) 1000 MG (MOD) 24 hr tablet Take 1 tablet (1,000 mg total) by mouth 2 (two) times daily with a meal.  30 tablet  3  . Multiple Vitamin (MULTIVITAMIN WITH MINERALS) TABS Take 1 tablet by mouth daily.      . nitroGLYCERIN (NITROSTAT) 0.4 MG SL tablet Place 1 tablet (0.4 mg total) under the tongue every 5 (five) minutes x 3 doses as needed for chest pain.  25 tablet  3  . Omega-3 Fatty Acids (FISH OIL) 1000 MG CAPS Take 2,000 mg by  mouth daily.      . Ticagrelor (BRILINTA) 90 MG TABS tablet Take 1 tablet (90 mg total) by mouth 2 (two) times daily.  60 tablet  3   No current facility-administered medications on file prior to visit.    Allergies  Allergen Reactions  . Penicillins Rash    Lip swelling    Past Medical History  Diagnosis Date  . Hypertension   . Diabetes mellitus     Uncontrolled, Hgb A1C 14.8% on 03/14, started on insulin  . CAD (coronary artery disease) 01/09/13    Inferior STEMI s/p DES-RCA  . Ischemic cardiomyopathy     EF 45-50%, grade 1 diastolic dysfunction, mildly dilated RV, RA at the upper limits of normal, mild TR, PA systolic pressure 32 mm mercury and hypokinesis to akinesis of the basal mid inferior myocardium  . Myocardial infarction 01/09/2013  . Shortness of breath   . Gout     Past Surgical History  Procedure Laterality Date  . Coronary angioplasty with stent placement  01/09/2013    30% pLAD, 30% mLAD, 70% pLCx, RCA occlusion at crux with R->L distal collaterals s/p DES; LVEF 50%, moderate-severe HK of inferior basal wall  . Cardiac catheterization  01/22/2013    Diffuse borderline residual CAD consistent with uncontrolled diabetes, medical  management recommended    History  Smoking status  . Never Smoker   Smokeless tobacco  . Never Used    History  Alcohol Use  . Yes    Comment: i have not drank in a year "    No family history on file.  Review of Systems: As noted in history of present illness.  All other systems were reviewed and are negative.  Physical Exam: BP 110/80  Pulse 60  Ht 5\' 6"  (1.676 m)  Wt 171 lb 1.9 oz (77.62 kg)  BMI 27.63 kg/m2 He is a pleasant Latino male in no acute distress. HEENT: Normal. Lungs: Clear Cardiovascular: Regular rate and rhythm without gallop, murmur, or click. Next line abdomen: Soft and nontender. No masses or bruits. Bowel sounds are positive. Extremities: No edema. Radial and pedal pulses are 2+. Skin: Warm and  dry Neuro: Alert and oriented x3. Normal motor and sensory exam. LABORATORY DATA:   Assessment / Plan: 1. Coronary disease status post inferior STEMI treated with DES to the RCA. Repeat cardiac catheterization demonstrated continued patency. Moderate diffuse disease in the left circumflex. We will continue aspirin. I think that his shortness of breath may be related to Brilinta. I would recommend switching him to Plavix. This would be the least costly option and he currently does not have insurance but has applied to Medicaid. 2. Diabetes mellitus: On metformin and insulin. Needs to establish primary care followup. Patient is waiting for his Medicaid to come through. 3. Dyslipidemia. On statin therapy. 4. Hypertension, well controlled. Continue metoprolol.

## 2013-02-05 NOTE — Patient Instructions (Addendum)
We will switch Brilinta to Plavix 75 mg daily. I think this will help your shortness of breath.  I will see you in 3 months.

## 2013-02-06 ENCOUNTER — Telehealth: Payer: Self-pay | Admitting: *Deleted

## 2013-02-06 MED ORDER — METFORMIN HCL ER 500 MG PO TB24: ORAL_TABLET | ORAL | Status: DC

## 2013-02-06 NOTE — Telephone Encounter (Signed)
See previous 02/06/13 note.

## 2013-02-06 NOTE — Telephone Encounter (Signed)
Pharmacy calling to verify Metformin Rx. Wanted to know if we wanted to switch it to Metformin ER b/c recent rx is very expensive for patient. I let the pharmacy associate know this message will be forward to the nurse for the Dr's approval for the medication change. She agreed and said thank you.    Micki Riley, CMA

## 2013-02-06 NOTE — Addendum Note (Signed)
Addended by: Meda Klinefelter D on: 02/06/2013 06:44 PM   Modules accepted: Orders, Medications

## 2013-02-06 NOTE — Telephone Encounter (Signed)
Spoke to pharmacist at [facility] she stated cheaper for patient to take metformin er 500 mg 2 tablets twice a day.

## 2013-02-09 ENCOUNTER — Encounter: Payer: Self-pay | Admitting: Cardiology

## 2013-02-17 NOTE — Care Management (Signed)
CM received a call for pt assistance from [employer]. CM did call the co back in reference to pt. My number was on there for contact. Not sure how that happened. CM did make them aware of pt's home and work numbers. No further needs from CM. Gala Lewandowsky, RN,BSN [phone]

## 2013-03-26 ENCOUNTER — Ambulatory Visit (INDEPENDENT_AMBULATORY_CARE_PROVIDER_SITE_OTHER): Payer: Medicaid Other | Admitting: Family Medicine

## 2013-03-26 ENCOUNTER — Telehealth: Payer: Self-pay | Admitting: Family Medicine

## 2013-03-26 VITALS — BP 118/79 | HR 64 | Temp 97.9°F | Ht 66.0 in | Wt 170.0 lb

## 2013-03-26 DIAGNOSIS — M549 Dorsalgia, unspecified: Secondary | ICD-10-CM

## 2013-03-26 MED ORDER — ACCU-CHEK MULTICLIX LANCETS MISC: Status: DC

## 2013-03-26 MED ORDER — INSULIN SYRINGES (DISPOSABLE) U-100 0.3 ML MISC: 60.0000 [IU]/d | Freq: Four times a day (QID) | Status: DC

## 2013-03-26 MED ORDER — HYDROCODONE-ACETAMINOPHEN 10-325 MG PO TABS: 1.0000 | ORAL_TABLET | Freq: Three times a day (TID) | ORAL | Status: DC | PRN

## 2013-03-26 MED ORDER — GLUCOSE BLOOD VI STRP: ORAL_STRIP | Status: DC

## 2013-03-26 NOTE — Patient Instructions (Signed)
Back Pain, Adult  Low back pain is very common. About 1 in 5 people have back pain. The cause of low back pain is rarely dangerous. The pain often gets better over time. About half of people with a sudden onset of back pain feel better in just 2 weeks. About 8 in 10 people feel better by 6 weeks.   CAUSES  Some common causes of back pain include:  · Strain of the muscles or ligaments supporting the spine.  · Wear and tear (degeneration) of the spinal discs.  · Arthritis.  · Direct injury to the back.  DIAGNOSIS  Most of the time, the direct cause of low back pain is not known. However, back pain can be treated effectively even when the exact cause of the pain is unknown. Answering your caregiver's questions about your overall health and symptoms is one of the most accurate ways to make sure the cause of your pain is not dangerous. If your caregiver needs more information, he or she may order lab work or imaging tests (X-rays or MRIs). However, even if imaging tests show changes in your back, this usually does not require surgery.  HOME CARE INSTRUCTIONS  For many people, back pain returns. Since low back pain is rarely dangerous, it is often a condition that people can learn to manage on their own.   · Remain active. It is stressful on the back to sit or stand in one place. Do not sit, drive, or stand in one place for more than 30 minutes at a time. Take short walks on level surfaces as soon as pain allows. Try to increase the length of time you walk each day.  · Do not stay in bed. Resting more than 1 or 2 days can delay your recovery.  · Do not avoid exercise or work. Your body is made to move. It is not dangerous to be active, even though your back may hurt. Your back will likely heal faster if you return to being active before your pain is gone.  · Pay attention to your body when you  bend and lift. Many people have less discomfort when lifting if they bend their knees, keep the load close to their bodies, and  avoid twisting. Often, the most comfortable positions are those that put less stress on your recovering back.  · Find a comfortable position to sleep. Use a firm mattress and lie on your side with your knees slightly bent. If you lie on your back, put a pillow under your knees.  · Only take over-the-counter or prescription medicines as directed by your caregiver. Over-the-counter medicines to reduce pain and inflammation are often the most helpful. Your caregiver may prescribe muscle relaxant drugs. These medicines help dull your pain so you can more quickly return to your normal activities and healthy exercise.  · Put ice on the injured area.  · Put ice in a plastic bag.  · Place a towel between your skin and the bag.  · Leave the ice on for 15-20 minutes, 3-4 times a day for the first 2 to 3 days. After that, ice and heat may be alternated to reduce pain and spasms.  · Ask your caregiver about trying back exercises and gentle massage. This may be of some benefit.  · Avoid feeling anxious or stressed. Stress increases muscle tension and can worsen back pain. It is important to recognize when you are anxious or stressed and learn ways to manage it. Exercise is a great option.  SEEK MEDICAL CARE IF:  · You have pain that is not relieved with rest or   medicine.  · You have pain that does not improve in 1 week.  · You have new symptoms.  · You are generally not feeling well.  SEEK IMMEDIATE MEDICAL CARE IF:   · You have pain that radiates from your back into your legs.  · You develop new bowel or bladder control problems.  · You have unusual weakness or numbness in your arms or legs.  · You develop nausea or vomiting.  · You develop abdominal pain.  · You feel faint.  Document Released: 10/01/2005 Document Revised: 04/01/2012 Document Reviewed: 02/19/2011  Exit[employer]® Patient Information ©2014 [employer].

## 2013-03-26 NOTE — Telephone Encounter (Signed)
He needs his accucheck aviva lancets and strips qid and his insulin needles called into [facility]

## 2013-03-26 NOTE — Progress Notes (Signed)
  Subjective:    Patient ID: Ethan Peters, male    DOB: November 14, 1955, 57 y.o.   MRN: [id number]  HPI This 57 y.o. male presents for evaluation of back pain which is radiating down both legs. He has been having back pain for 2 years.  The pain is worse now and is severe 8 on scale of 1-10. He is having difficulty sleeping at night.   He has difficulty straightening legs out at night.  He  Has had progressive pain down his legs to his knees on both sides.   Review of Systems  HENT: Negative.   Eyes: Negative.   Respiratory: Negative.   Cardiovascular: Negative.   Gastrointestinal: Negative.   Genitourinary: Negative.   Musculoskeletal: Positive for back pain, arthralgias and gait problem.  Neurological: Positive for weakness and numbness.        Objective:   Physical Exam  Constitutional: He appears well-developed and well-nourished.  HENT:  Head: Normocephalic and atraumatic.  Cardiovascular: Normal rate and regular rhythm.   Pulmonary/Chest: Effort normal and breath sounds normal.  Abdominal: Soft. Bowel sounds are normal.  Musculoskeletal: He exhibits tenderness.  Tenderness with palpation LS spine.  Patient with shuffling gait.  Neurological: Coordination abnormal.  SLR positive bilateral.  Weak dorsi and plantar flexion bilateral.  Weak lower extremities.           Assessment & Plan:  Back pain - Plan: MR Lumbar Spine Wo Contrast, HYDROcodone-acetaminophen (NORCO) 10-325 MG per tablet Follow up in 2 weeks.

## 2013-03-27 ENCOUNTER — Other Ambulatory Visit: Payer: Self-pay | Admitting: Family Medicine

## 2013-03-27 DIAGNOSIS — M549 Dorsalgia, unspecified: Secondary | ICD-10-CM

## 2013-03-27 NOTE — Telephone Encounter (Signed)
Was faxed in on6/12/14 because bills escrib was down

## 2013-04-09 ENCOUNTER — Ambulatory Visit: Payer: Medicaid Other | Attending: Family Medicine | Admitting: Physical Therapy

## 2013-04-09 DIAGNOSIS — M545 Low back pain, unspecified: Secondary | ICD-10-CM | POA: Insufficient documentation

## 2013-04-09 DIAGNOSIS — R5381 Other malaise: Secondary | ICD-10-CM | POA: Insufficient documentation

## 2013-04-09 DIAGNOSIS — IMO0001 Reserved for inherently not codable concepts without codable children: Secondary | ICD-10-CM | POA: Insufficient documentation

## 2013-04-23 ENCOUNTER — Ambulatory Visit (INDEPENDENT_AMBULATORY_CARE_PROVIDER_SITE_OTHER): Payer: Medicaid Other | Admitting: Family Medicine

## 2013-04-23 ENCOUNTER — Ambulatory Visit (INDEPENDENT_AMBULATORY_CARE_PROVIDER_SITE_OTHER): Payer: Medicaid Other

## 2013-04-23 ENCOUNTER — Encounter: Payer: Self-pay | Admitting: Family Medicine

## 2013-04-23 VITALS — BP 107/75 | HR 62 | Temp 97.0°F | Wt 168.0 lb

## 2013-04-23 DIAGNOSIS — M549 Dorsalgia, unspecified: Secondary | ICD-10-CM

## 2013-04-23 MED ORDER — HYDROCODONE-ACETAMINOPHEN 10-325 MG PO TABS: 1.0000 | ORAL_TABLET | Freq: Three times a day (TID) | ORAL | Status: DC | PRN

## 2013-04-23 NOTE — Patient Instructions (Signed)
Back Pain, Adult  Low back pain is very common. About 1 in 5 people have back pain. The cause of low back pain is rarely dangerous. The pain often gets better over time. About half of people with a sudden onset of back pain feel better in just 2 weeks. About 8 in 10 people feel better by 6 weeks.   CAUSES  Some common causes of back pain include:  · Strain of the muscles or ligaments supporting the spine.  · Wear and tear (degeneration) of the spinal discs.  · Arthritis.  · Direct injury to the back.  DIAGNOSIS  Most of the time, the direct cause of low back pain is not known. However, back pain can be treated effectively even when the exact cause of the pain is unknown. Answering your caregiver's questions about your overall health and symptoms is one of the most accurate ways to make sure the cause of your pain is not dangerous. If your caregiver needs more information, he or she may order lab work or imaging tests (X-rays or MRIs). However, even if imaging tests show changes in your back, this usually does not require surgery.  HOME CARE INSTRUCTIONS  For many people, back pain returns. Since low back pain is rarely dangerous, it is often a condition that people can learn to manage on their own.   · Remain active. It is stressful on the back to sit or stand in one place. Do not sit, drive, or stand in one place for more than 30 minutes at a time. Take short walks on level surfaces as soon as pain allows. Try to increase the length of time you walk each day.  · Do not stay in bed. Resting more than 1 or 2 days can delay your recovery.  · Do not avoid exercise or work. Your body is made to move. It is not dangerous to be active, even though your back may hurt. Your back will likely heal faster if you return to being active before your pain is gone.  · Pay attention to your body when you  bend and lift. Many people have less discomfort when lifting if they bend their knees, keep the load close to their bodies, and  avoid twisting. Often, the most comfortable positions are those that put less stress on your recovering back.  · Find a comfortable position to sleep. Use a firm mattress and lie on your side with your knees slightly bent. If you lie on your back, put a pillow under your knees.  · Only take over-the-counter or prescription medicines as directed by your caregiver. Over-the-counter medicines to reduce pain and inflammation are often the most helpful. Your caregiver may prescribe muscle relaxant drugs. These medicines help dull your pain so you can more quickly return to your normal activities and healthy exercise.  · Put ice on the injured area.  · Put ice in a plastic bag.  · Place a towel between your skin and the bag.  · Leave the ice on for 15-20 minutes, 3-4 times a day for the first 2 to 3 days. After that, ice and heat may be alternated to reduce pain and spasms.  · Ask your caregiver about trying back exercises and gentle massage. This may be of some benefit.  · Avoid feeling anxious or stressed. Stress increases muscle tension and can worsen back pain. It is important to recognize when you are anxious or stressed and learn ways to manage it. Exercise is a great option.  SEEK MEDICAL CARE IF:  · You have pain that is not relieved with rest or   medicine.  · You have pain that does not improve in 1 week.  · You have new symptoms.  · You are generally not feeling well.  SEEK IMMEDIATE MEDICAL CARE IF:   · You have pain that radiates from your back into your legs.  · You develop new bowel or bladder control problems.  · You have unusual weakness or numbness in your arms or legs.  · You develop nausea or vomiting.  · You develop abdominal pain.  · You feel faint.  Document Released: 10/01/2005 Document Revised: 04/01/2012 Document Reviewed: 02/19/2011  Exit[employer]® Patient Information ©2014 [employer].

## 2013-04-23 NOTE — Progress Notes (Signed)
  Subjective:    Patient ID: Ethan Peters, male    DOB: 1956-08-13, 57 y.o.   MRN: [id number]  HPI This 58 y.o. male presents for evaluation of back pain which is radiating down both legs.  He Describes having moderate to severe pain in his back.  He has hx of CAD and sees cardiology and Has an appointment in near future with Cardiology.  He did some PT for his back and it did Not help.  He did take the pain meds and they helped some.C   Review of Systems C/o back pain. No chest pain, SOB, HA, dizziness, vision change, N/V, diarrhea, constipation, dysuria, urinary urgency or frequency, or rash.     Objective:   Physical Exam Vital signs noted  Well developed well nourished male.  HEENT - Head atraumatic Normocephalic                Eyes - PERRLA, Conjuctiva - clear Sclera- Clear EOMI                Ears - EAC's Wnl TM's Wnl Gross Hearing WNL                Nose - Nares patent                 Throat - oropharanx wnl Respiratory - Lungs CTA bilateral Cardiac - RRR S1 and S2 without murmur MS - TTP bilat LS paraspinous muscles, decreased dorsi and plantar flexion bilat With positive SLR bilateral.       Assessment & Plan:  Back pain - Plan: HYDROcodone-acetaminophen (NORCO) 10-325 MG per tablet, DG Lumbar Spine 2-3 Views Xray of LS spine and referral to orthopedics

## 2013-05-11 ENCOUNTER — Telehealth: Payer: Self-pay

## 2013-05-11 ENCOUNTER — Ambulatory Visit (INDEPENDENT_AMBULATORY_CARE_PROVIDER_SITE_OTHER): Payer: Medicaid Other | Admitting: Cardiology

## 2013-05-11 ENCOUNTER — Encounter: Payer: Self-pay | Admitting: Cardiology

## 2013-05-11 VITALS — BP 130/78 | HR 62 | Ht 66.0 in | Wt 162.1 lb

## 2013-05-11 DIAGNOSIS — R0600 Dyspnea, unspecified: Secondary | ICD-10-CM

## 2013-05-11 DIAGNOSIS — I2589 Other forms of chronic ischemic heart disease: Secondary | ICD-10-CM

## 2013-05-11 DIAGNOSIS — I255 Ischemic cardiomyopathy: Secondary | ICD-10-CM

## 2013-05-11 DIAGNOSIS — R0989 Other specified symptoms and signs involving the circulatory and respiratory systems: Secondary | ICD-10-CM

## 2013-05-11 DIAGNOSIS — E119 Type 2 diabetes mellitus without complications: Secondary | ICD-10-CM

## 2013-05-11 DIAGNOSIS — E1165 Type 2 diabetes mellitus with hyperglycemia: Secondary | ICD-10-CM

## 2013-05-11 DIAGNOSIS — I10 Essential (primary) hypertension: Secondary | ICD-10-CM

## 2013-05-11 DIAGNOSIS — I2510 Atherosclerotic heart disease of native coronary artery without angina pectoris: Secondary | ICD-10-CM

## 2013-05-11 NOTE — Patient Instructions (Addendum)
Continue your current therapy  We will schedule you for a nuclear stress test   

## 2013-05-11 NOTE — Telephone Encounter (Signed)
Dr.Jason Sanders's office called spoke to Ethan Peters Dr.Jordan cleared patient to have laser eye surgery.

## 2013-05-11 NOTE — Progress Notes (Signed)
Ethan Peters Date of Birth: 08/16/1956 Medical Record #[id number]  History of Present Illness: Ethan Peters is seen for followup today. He is status post inferior STEMI on 01/09/2013. He had stenting of the RCA at the crux. He had persistent chest pain and dyspnea even after his infarct with atypical symptoms. He subsequently underwent repeat cardiac catheterization on April 10 which showed excellent patency of the stent in the RCA. He does have a long 70% stenosis in the left circumflex that will be treated medically. He has multiple complaints today. His biggest complaint is that he feels fatigued. He reports that his diabetes has been under poor control. He has lost 9 pounds. He still complains of a pain in his left chest that he calls his "heart pain". This is a sharp localized pain beneath the left breast. This occurs every day. It is worse with lifting or taking a deep breath. His pain actually gets better after he is walk 3 or 4 minutes. Sometimes his pain may last for several hours. He was recently seen by a retinal specialist and has significant proliferative retinopathy. Laser surgery as planned. Unfortunately noted no improvement with his shortness of breath when we switched him from Brilinta to Plavix.  Current Outpatient Prescriptions on File Prior to Visit  Medication Sig Dispense Refill  . allopurinol (ZYLOPRIM) 300 MG tablet Take 300 mg by mouth daily as needed (gout pain).      Marland Kitchen ALPRAZolam (XANAX) 0.25 MG tablet Take 1 tablet (0.25 mg total) by mouth 3 (three) times daily as needed for anxiety.  30 tablet  3  . aspirin EC 81 MG EC tablet Take 1 tablet (81 mg total) by mouth daily.      Marland Kitchen atorvastatin (LIPITOR) 80 MG tablet Take 80 mg by mouth daily at 6 PM. For cholesterol      . B Complex-C (B-COMPLEX WITH VITAMIN C) tablet Take 1 tablet by mouth as needed (for vitamin deficiency).      . clopidogrel (PLAVIX) 75 MG tablet Take 1 tablet (75 mg total) by mouth daily.  90 tablet  3   . GLUCOSAMINE PO Take 1 capsule by mouth daily.      Marland Kitchen glucose blood test strip Use as instructed  100 each  12  . HYDROcodone-acetaminophen (NORCO) 10-325 MG per tablet Take 1 tablet by mouth every 8 (eight) hours as needed for pain.  30 tablet  1  . insulin NPH-regular (RELION 70/30) (70-30) 100 UNIT/ML injection Inject 10 Units into the skin 2 (two) times daily with a meal. 10 units at breakfast. 10 units at supper.  10 mL  12  . Insulin Syringes, Disposable, U-100 0.3 ML MISC 60 Units/day by Does not apply route 4 (four) times daily.  100 each  5  . Lancets (ACCU-CHEK MULTICLIX) lancets Use as instructed  100 each  12  . metFORMIN (GLUCOPHAGE-XR) 500 MG 24 hr tablet Take 2 tablets twice a day  120 tablet  6  . metoprolol tartrate (LOPRESSOR) 12.5 mg TABS Take 12.5 mg by mouth 2 (two) times daily. For blood pressure      . Multiple Vitamin (MULTIVITAMIN WITH MINERALS) TABS Take 1 tablet by mouth daily.      . nitroGLYCERIN (NITROSTAT) 0.4 MG SL tablet Place 1 tablet (0.4 mg total) under the tongue every 5 (five) minutes x 3 doses as needed for chest pain.  25 tablet  3  . Omega-3 Fatty Acids (FISH OIL) 1000 MG CAPS Take 2,000  mg by mouth daily.       No current facility-administered medications on file prior to visit.    Allergies  Allergen Reactions  . Penicillins Rash    Lip swelling    Past Medical History  Diagnosis Date  . Hypertension   . Diabetes mellitus     Uncontrolled, Hgb A1C 14.8% on 03/14, started on insulin  . CAD (coronary artery disease) 01/09/13    Inferior STEMI s/p DES-RCA  . Ischemic cardiomyopathy     EF 45-50%, grade 1 diastolic dysfunction, mildly dilated RV, RA at the upper limits of normal, mild TR, PA systolic pressure 32 mm mercury and hypokinesis to akinesis of the basal mid inferior myocardium  . Myocardial infarction 01/09/2013  . Shortness of breath   . Gout     Past Surgical History  Procedure Laterality Date  . Coronary angioplasty with stent  placement  01/09/2013    30% pLAD, 30% mLAD, 70% pLCx, RCA occlusion at crux with R->L distal collaterals s/p DES; LVEF 50%, moderate-severe HK of inferior basal wall  . Cardiac catheterization  01/22/2013    Diffuse borderline residual CAD consistent with uncontrolled diabetes, medical management recommended    History  Smoking status  . Never Smoker   Smokeless tobacco  . Never Used    History  Alcohol Use  . Yes    Comment: i have not drank in a year "    Family History  Problem Relation Age of Onset  . Heart disease Mother   . Heart disease Father     Review of Systems: As noted in history of present illness.  All other systems were reviewed and are negative.  Physical Exam: BP 130/78  Pulse 62  Ht 5\' 6"  (1.676 m)  Wt 162 lb 1.9 oz (73.537 kg)  BMI 26.18 kg/m2  SpO2 98% He is a pleasant Latino male in no acute distress. HEENT: Normal. Lungs: Clear Cardiovascular: Regular rate and rhythm without gallop, murmur, or click. He has slight chest wall pain to palpation beneath the left breast.  Abdomen: Soft and nontender. No masses or bruits. Bowel sounds are positive. Extremities: No edema. Radial and pedal pulses are 2+. Skin: Warm and dry Neuro: Alert and oriented x3. Normal motor and sensory exam. LABORATORY DATA:   Assessment / Plan: 1. Coronary disease status post inferior STEMI treated with DES to the RCA. Repeat cardiac catheterization demonstrated continued patency. Moderate diffuse disease in the left circumflex. We will continue aspirin and Plavix. I think his current chest pain is more musculoskeletal. However since he has had ongoing chest pain problem since his heart attack we will schedule him for a stress Myoview study to see if he is having significant ischemia. 2. Diabetes mellitus: On metformin and insulin. Needs close followup with primary care to obtain better diabetic control. May need to consider referral to endocrinology. Patient reports frequent  episodes of hypoglycemia with his current treatment. 3. Dyslipidemia. On statin therapy. 4. Hypertension, well controlled. Continue metoprolol. 5. Fatigue-possibly related to his poorly controlled diabetes. Recommend followup with his primary care. 6. Diabetic retinopathy. Patient is cleared for laser surgery.

## 2013-05-14 ENCOUNTER — Ambulatory Visit (HOSPITAL_COMMUNITY): Payer: Medicaid Other | Attending: Cardiology | Admitting: Radiology

## 2013-05-14 VITALS — BP 130/84 | HR 54 | Ht 66.0 in | Wt 166.0 lb

## 2013-05-14 DIAGNOSIS — Z8249 Family history of ischemic heart disease and other diseases of the circulatory system: Secondary | ICD-10-CM | POA: Insufficient documentation

## 2013-05-14 DIAGNOSIS — R000 Tachycardia, unspecified: Secondary | ICD-10-CM | POA: Insufficient documentation

## 2013-05-14 DIAGNOSIS — Z794 Long term (current) use of insulin: Secondary | ICD-10-CM | POA: Insufficient documentation

## 2013-05-14 DIAGNOSIS — R110 Nausea: Secondary | ICD-10-CM | POA: Insufficient documentation

## 2013-05-14 DIAGNOSIS — E109 Type 1 diabetes mellitus without complications: Secondary | ICD-10-CM | POA: Insufficient documentation

## 2013-05-14 DIAGNOSIS — I2510 Atherosclerotic heart disease of native coronary artery without angina pectoris: Secondary | ICD-10-CM

## 2013-05-14 DIAGNOSIS — R0989 Other specified symptoms and signs involving the circulatory and respiratory systems: Secondary | ICD-10-CM | POA: Insufficient documentation

## 2013-05-14 DIAGNOSIS — E1165 Type 2 diabetes mellitus with hyperglycemia: Secondary | ICD-10-CM

## 2013-05-14 DIAGNOSIS — I255 Ischemic cardiomyopathy: Secondary | ICD-10-CM

## 2013-05-14 DIAGNOSIS — R079 Chest pain, unspecified: Secondary | ICD-10-CM

## 2013-05-14 DIAGNOSIS — R002 Palpitations: Secondary | ICD-10-CM | POA: Insufficient documentation

## 2013-05-14 DIAGNOSIS — I10 Essential (primary) hypertension: Secondary | ICD-10-CM | POA: Insufficient documentation

## 2013-05-14 DIAGNOSIS — R0789 Other chest pain: Secondary | ICD-10-CM | POA: Insufficient documentation

## 2013-05-14 DIAGNOSIS — R0600 Dyspnea, unspecified: Secondary | ICD-10-CM

## 2013-05-14 DIAGNOSIS — R5381 Other malaise: Secondary | ICD-10-CM | POA: Insufficient documentation

## 2013-05-14 DIAGNOSIS — Z9861 Coronary angioplasty status: Secondary | ICD-10-CM | POA: Insufficient documentation

## 2013-05-14 DIAGNOSIS — R5383 Other fatigue: Secondary | ICD-10-CM | POA: Insufficient documentation

## 2013-05-14 DIAGNOSIS — R61 Generalized hyperhidrosis: Secondary | ICD-10-CM | POA: Insufficient documentation

## 2013-05-14 DIAGNOSIS — R0609 Other forms of dyspnea: Secondary | ICD-10-CM | POA: Insufficient documentation

## 2013-05-14 DIAGNOSIS — I252 Old myocardial infarction: Secondary | ICD-10-CM | POA: Insufficient documentation

## 2013-05-14 DIAGNOSIS — R0602 Shortness of breath: Secondary | ICD-10-CM | POA: Insufficient documentation

## 2013-05-14 MED ORDER — TECHNETIUM TC 99M SESTAMIBI GENERIC - CARDIOLITE
33.0000 | Freq: Once | INTRAVENOUS | Status: AC | PRN
  Administered 2013-05-14: 33 via INTRAVENOUS

## 2013-05-14 MED ORDER — TECHNETIUM TC 99M SESTAMIBI GENERIC - CARDIOLITE
11.0000 | Freq: Once | INTRAVENOUS | Status: AC | PRN
  Administered 2013-05-14: 11 via INTRAVENOUS

## 2013-05-14 NOTE — Progress Notes (Signed)
[facility] SITE [address] NUCLEAR MED [address] [place], [place] [address] [phone]    Cardiology Nuclear Med Study  Ethan Peters is a 57 y.o. male     MRN : [id number]     DOB: [birth year]  Procedure Date: 05/14/2013  Nuclear Med Background Indication for Stress Test:  Evaluation for Ischemia and PTCA/Stent Patency History:  01/09/13 Inferior STEMI>Cath>PTCA/Stent; 3/14 Echo:EF=50%; 4/14 Cath:patent stent, EF=50% Cardiac Risk Factors: Family History - CAD, Hypertension and IDDM Type 1  Symptoms:  Chest Pain with and without Exertion (last episode of chest discomfort was today with deep breath), Diaphoresis, DOE, Fatigue, Nausea, Palpitations, Rapid HR and SOB   Nuclear Pre-Procedure Caffeine/Decaff Intake:  None > 12 hrs NPO After: 8:00pm   Lungs:  Clear. O2 Sat: 98% on room air. IV 0.9% NS with Angio Cath:  22g  IV Site: L Antecubital , tolerated well IV Started by:  Irean Hong, RN  Chest Size (in):  42 Cup Size: n/a  Height: 5\' 6"  (1.676 m)  Weight:  166 lb (75.297 kg)  BMI:  Body mass index is 26.81 kg/(m^2). Tech Comments:  Held Lopressor x 24 hrs. Fasting CBG was 112 @ 6:30 am, no insulin today.    Nuclear Med Study 1 or 2 day study: 1 day  Stress Test Type:  Stress  Reading MD: Cassell Clement, MD  Order Authorizing Provider:  Peter Swaziland, MD  Resting Radionuclide: Technetium 62m Sestamibi  Resting Radionuclide Dose: 11.0 mCi   Stress Radionuclide:  Technetium 47m Sestamibi  Stress Radionuclide Dose: 33.0 mCi           Stress Protocol Rest HR: 54 Stress HR: 146  Rest BP: 130/84 Stress BP: 187/79  Exercise Time (min): 10:15 METS: 11.1   Predicted Max HR: 163 bpm % Max HR: 89.57 bpm Rate Pressure Product: 21308   Dose of Adenosine (mg):  n/a Dose of Lexiscan: n/a mg  Dose of Atropine (mg): n/a Dose of Dobutamine: n/a mcg/kg/min (at max HR)  Stress Test Technologist: Smiley Houseman, CMA-N  Nuclear Technologist:  Domenic Polite, CNMT      Rest Procedure:  Myocardial perfusion imaging was performed at rest 45 minutes following the intravenous administration of Technetium 49m Sestamibi.  Rest ECG: NSR - Normal EKG  Stress Procedure:  The patient exercised on the treadmill utilizing the Bruce Protocol for 10:15 minutes. The patient stopped due to fatigue.  He c/o chest pain with exercise.  Technetium 17m Sestamibi was injected at peak exercise and myocardial perfusion imaging was performed after a brief delay.  Stress ECG: No significant change from baseline ECG  QPS Raw Data Images:  Patient motion noted. Stress Images:  There is decreased uptake in the inferior wall. Rest Images:  There is decreased uptake in the inferior wall. Subtraction (SDS):  There is a fixed inferior defect that is most consistent with inferior wall scar. No reversibility is seen. Transient Ischemic Dilatation (Normal <1.22):  n/a Lung/Heart Ratio (Normal <0.45):  0.36  Quantitative Gated Spect Images QGS EDV:  87 ml QGS ESV:  39 ml  Impression Exercise Capacity:  Excellent exercise capacity. BP Response:  Normal blood pressure response. Clinical Symptoms:  Mild chest pain/dyspnea. ECG Impression:  No significant ST segment change suggestive of ischemia. Comparison with Prior Nuclear Study: No previous nuclear study performed  Overall Impression:  Low risk stress nuclear study.  There is a medium sized, moderate severity fixed defect involving the basal inferoseptal, inferolateral and inferior segments consistent  with old inferior wall scar. There is no reversibility.  LV Ejection Fraction: 55%.  LV Wall Motion:  NL LV Function; NL Wall Motion  [employer]

## 2013-06-01 ENCOUNTER — Encounter: Payer: Self-pay | Admitting: Pharmacist

## 2013-06-01 ENCOUNTER — Telehealth: Payer: Self-pay | Admitting: Pharmacist

## 2013-06-01 ENCOUNTER — Ambulatory Visit (INDEPENDENT_AMBULATORY_CARE_PROVIDER_SITE_OTHER): Payer: Medicaid Other | Admitting: Pharmacist

## 2013-06-01 VITALS — BP 132/82 | HR 70 | Ht 66.0 in | Wt 172.0 lb

## 2013-06-01 DIAGNOSIS — E119 Type 2 diabetes mellitus without complications: Secondary | ICD-10-CM

## 2013-06-01 DIAGNOSIS — I2510 Atherosclerotic heart disease of native coronary artery without angina pectoris: Secondary | ICD-10-CM

## 2013-06-01 DIAGNOSIS — I10 Essential (primary) hypertension: Secondary | ICD-10-CM

## 2013-06-01 DIAGNOSIS — E1165 Type 2 diabetes mellitus with hyperglycemia: Secondary | ICD-10-CM

## 2013-06-01 NOTE — Progress Notes (Signed)
Diabetes Flow Sheet:  Visit 1  Chief Complaint:   Chief Complaint  Patient presents with  . Diabetes    Filed Vitals:   06/01/13 0827  BP: 132/82  Pulse: 70  Heart Rate - Regular rate and rhythm  HPI:  Ethan Peters was diagnosed with type 2DM in 2002.  He was going to the [facility] Department for treatment until he qualified for Medicaid earlier this year.  His last A1c was in March 2014 and was very elevated at 14.8%.  In March Ethan Peters also suffered a MI and had a stent placed.  He was placed on plavix, ASA and metoprolol following his stent / MI.  Current DM medications are Lantus 10 units at bedtime, Novolog 20 units tid prior to meals although patient sometimes only eats 2 meals a day and still gives 3 injections of Novolog and metformin 1000mg  1 tablet bid  HBG readings - checks 3-4 times daily.  He report that most readings are 150's to 180's but he also experiences hypoglycemic events about 3-4 times a week with BG in the 60's.  Hypoglycemia occurs at varying times of day.   Exam Edema:  negative Polyuria:  negative  Polydipsia:  negative Polyphagia:  negative  BMI:  Body mass index is 27.77 kg/(m^2).   Weight changes:  stable General Appearance:  alert, oriented, no acute distress and well nourished, muscular build Mood/Affect:  normal, NAD  Low fat/carbohydrate diet?  No  Lots of fruit and fruit juice and pasta  He does eat cauliflower, carrots, and veggie, oatmeal and fruit smoothie in morning Nicotine Abuse?  No Medication Compliance?  Yes Exercise?  Yes Alcohol Abuse?  No   Lab Results  Component Value Date   HGBA1C 6.3 06/01/2013    Lab Results  Component Value Date   CHOL 158 01/10/2013   HDL 52 01/10/2013   LDLCALC 87 01/10/2013   TRIG 94 01/10/2013   CHOLHDL 3.0 01/10/2013     Medication Checklist: ACE Inhibitor/ARB?  No Lipid Lowering Agent?  Yes Aspirin?  Yes Oral Hypoglycemic Agent(s)?  No  Assessment: 1.  type 2 Diabetes.  Better  control but concerned about hypoglycemia 2.  Blood Pressure Control.  good 3.  Lipid Control.  Labs pending today.  Recommendations: 1.  1500 calorie, carbohydrate counting diet.  Patient is counseled extensively on carbohydrate counting, serving sizes, saturated fat intake and meal planning.  Patient is instructed to eat 3 meals a day and 3 small snacks.  Patient will supplement snacks based on physical activity. 2.  15 minutes of physical activity as tolerated (patien to monitor for CP or back pain and stop if experiences either) .  Patient is counseled to always carry glucose tablets, lifesavers, hard candies, etc., while exercising in case of hypoglycemic event. 3.  Patient is counseled on pathophysiology of diabetes and the risk of long-term complications.  Fasting blood glucose goals are 80-130mg /dL.  Post-prandial goals are < 180.  A1C goals < 7.0%. 4.  LDL goal of < 100, HDL > 40 and TG < 150; BP goal < 140/85 5.  Patient is counseled on proper use of glucometer and lancing device.  Patient is instructed to continue to check BG 3-4 times daily and how to respond to unsuitable results. 6.  Medication recommendations at this time are as follows:  Continue lantus 10 units daily.  Continue Novolog 20 units with each meal (he is instructed that if he skips a meal to also skip  Novolog - hopefully this will help with hypoglycemia. 7. Patient is instructed to call office if he has more than 2 hypoglycemic events in 1 week for adjustment in insulin regimen.  8.   Orders Placed This Encounter  Procedures  . NMR, lipoprofile  . Hepatic function panel  . BMP8+EGFR  . Microalbumin, urine  . GAD-65 Autoantibody  . POCT glycosylated hemoglobin (Hb A1C)   9.  RTC in 1 month     Time spent counseling patient:  60 minutes       PharmD:  Henrene Pastor, PHARMD, CPP

## 2013-06-01 NOTE — Patient Instructions (Addendum)
Hypoglycemia (Low Blood Sugar) Hypoglycemia is when the glucose (sugar) in your blood is too low. Hypoglycemia can happen for many reasons. It can happen to people with or without diabetes. Hypoglycemia can develop quickly and can be a medical emergency.  CAUSES  Having hypoglycemia does not mean that you will develop diabetes. Different causes include:  Missed or delayed meals or not enough carbohydrates eaten.  Medication overdose. This could be by accident or deliberate. If by accident, your medication may need to be adjusted or changed.  Exercise or increased activity without adjustments in carbohydrates or medications.  A nerve disorder that affects body functions like your heart rate, blood pressure and digestion (autonomic neuropathy).  A condition where the stomach muscles do not function properly (gastroparesis). Therefore, medications may not absorb properly.  The inability to recognize the signs of hypoglycemia (hypoglycemic unawareness).  Absorption of insulin  may be altered.  Alcohol consumption.  Pregnancy/menstrual cycles/postpartum. This may be due to hormones.  Certain kinds of tumors. This is very rare. SYMPTOMS   Sweating.  Hunger.  Dizziness.  Blurred vision.  Drowsiness.  Weakness.  Headache.  Rapid heart beat.  Shakiness.  Nervousness. DIAGNOSIS  Diagnosis is made by monitoring blood glucose in one or all of the following ways:  Fingerstick blood glucose monitoring.  Laboratory results. TREATMENT  If you think your blood glucose is low:  Check your blood glucose, if possible. If it is less than 70 mg/dl, take one of the following:  3-4 glucose tablets.   cup juice (prefer clear like apple).   cup "regular" soda pop.  1 cup milk.  -1 tube of glucose gel.  5-6 hard candies.  Do not over treat because your blood glucose (sugar) will only go too high.  Wait 15 minutes and recheck your blood glucose. If it is still less than  70 mg/dl (or below your target range), repeat treatment.  Eat a snack if it is more than one hour until your next meal. Sometimes, your blood glucose may go so low that you are unable to treat yourself. You may need someone to help you. You may even pass out or be unable to swallow. This may require you to get an injection of glucagon, which raises the blood glucose. HOME CARE INSTRUCTIONS  Check blood glucose if you feel your BG is low or if you are having symptoms of low blood glucose.  Take medication as prescribed by your caregiver. If you have more than 2 low blood glucose event in 1 week call office [phone].  You might need adjustment in your insulin regimen.  Follow your meal plan. Do not skip meals. Eat on time.  If you are going to drink alcohol, drink it only with meals.  Check your blood glucose before driving.  Check your blood glucose before and after exercise. If you exercise longer or different than usual, be sure to check blood glucose more frequently.  Always carry treatment with you. Glucose tablets are the easiest to carry.  Always wear medical alert jewelry or carry some form of identification that states that you have diabetes. This will alert people that you have diabetes. If you have hypoglycemia, they will have a better idea on what to do. SEEK MEDICAL CARE IF:   You are having problems keeping your blood sugar at target range.  You are having frequent episodes of hypoglycemia.  You feel you might be having side effects from your medicines.  You have symptoms of an illness  that is not improving after 3-4 days.  You notice a change in vision or a new problem with your vision. SEEK IMMEDIATE MEDICAL CARE IF:   You are a family member or friend of a person whose blood glucose goes below 70 mg/dl and is accompanied by:  Confusion.  A change in mental status.  The inability to swallow.  Passing out. Document Released: 10/01/2005 Document Revised:  12/24/2011 Document Reviewed: 01/28/2012 [facility] Patient Information 2014 [place], [place].

## 2013-06-01 NOTE — Telephone Encounter (Signed)
Patient notified of A1c results 

## 2013-06-03 LAB — BMP8+EGFR
BUN: 28 mg/dL — ABNORMAL HIGH (ref 6–24)
CO2: 23 mmol/L (ref 18–29)
Calcium: 9.1 mg/dL (ref 8.7–10.2)
Chloride: 103 mmol/L (ref 97–108)
GFR calc Af Amer: 77 mL/min/{1.73_m2} (ref >59)
Glucose: 113 mg/dL — ABNORMAL HIGH (ref 65–99)

## 2013-06-03 LAB — NMR, LIPOPROFILE
Cholesterol: 104 mg/dL (ref <200)
HDL Cholesterol by NMR: 44 mg/dL (ref >=40)
HDL Particle Number: 29.4 umol/L — ABNORMAL LOW (ref >=30.5)
LDL Particle Number: 709 nmol/L (ref <1000)
LDL Size: 21 nm (ref >20.5)
LDLC SERPL CALC-MCNC: 45 mg/dL (ref <100)
LP-IR Score: 42 (ref <=45)
Small LDL Particle Number: 375 nmol/L (ref <=527)
Triglycerides by NMR: 77 mg/dL (ref <150)

## 2013-06-03 LAB — GAD-65 AUTOANTIBODY: Glutamic Acid Decarb Ab: <1 U/mL (ref 0.0–1.5)

## 2013-06-03 LAB — MICROALBUMIN, URINE: Microalbumin, Urine: <3 ug/mL (ref 0.0–17.0)

## 2013-06-03 LAB — HEPATIC FUNCTION PANEL
Alkaline Phosphatase: 73 IU/L (ref 39–117)
Total Protein: 6.7 g/dL (ref 6.0–8.5)

## 2013-06-11 ENCOUNTER — Telehealth: Payer: Self-pay | Admitting: Pharmacist

## 2013-06-11 NOTE — Telephone Encounter (Signed)
Patient notified of labs from 06/01/13

## 2013-06-19 ENCOUNTER — Telehealth: Payer: Self-pay | Admitting: Cardiology

## 2013-06-19 NOTE — Telephone Encounter (Signed)
Received request from Nurse, documents faxed for surgical clearance. To: [facility] Fax number: [phone] Attention: 06/19/13/KM

## 2013-06-22 ENCOUNTER — Encounter: Payer: Self-pay | Admitting: Family Medicine

## 2013-06-22 ENCOUNTER — Ambulatory Visit (INDEPENDENT_AMBULATORY_CARE_PROVIDER_SITE_OTHER): Payer: Medicaid Other | Admitting: Family Medicine

## 2013-06-22 VITALS — BP 117/78 | HR 61 | Temp 96.9°F | Ht 66.0 in | Wt 172.0 lb

## 2013-06-22 DIAGNOSIS — Z01818 Encounter for other preprocedural examination: Secondary | ICD-10-CM

## 2013-06-22 DIAGNOSIS — I25810 Atherosclerosis of coronary artery bypass graft(s) without angina pectoris: Secondary | ICD-10-CM

## 2013-06-22 NOTE — Progress Notes (Signed)
  Subjective:    Patient ID: Ethan Peters, male    DOB: Jun 22, 1956, 57 y.o.   MRN: [id number]  HPI This 57 y.o. male presents for evaluation of surgical clearance for back surgery.  He has been experiencing a lot of bilateral leg weakness and pain.  He has hx of CAD.  He has DDD of the lumbar spine and has been experiencing a lot of pain neural claudication sx's.  He was referred to ortho and has been evaluated and needs surgery.  He has hx of DM and this is controlled.  He has recently had labs.  He has been seeing Dr. Peter [place] cardiology And he has recently had a stress test.    Review of Systems    No chest pain, SOB, HA, dizziness, vision change, N/V, diarrhea, constipation, dysuria, urinary urgency or frequency, myalgias, arthralgias or rash.  Objective:   Physical Exam Vital signs noted  Well developed well nourished male.  HEENT - Head atraumatic Normocephalic                Eyes - PERRLA, Conjuctiva - clear Sclera- Clear EOMI                Ears - EAC's Wnl TM's Wnl Gross Hearing WNL                Nose - Nares patent                 Throat - oropharanx wnl Respiratory - Lungs CTA bilateral Cardiac - RRR S1 and S2 without murmur GI - Abdomen soft Nontender and bowel sounds active x 4 Extremities - Weakness bilateral Neuro - Weakness and numbness in lower extremities.       Assessment & Plan:  Preoperative clearance - Recent cmp, cbc, hgaic are normal.  He has been scheduled An appointment with cardiology for cardiac clearance.  He is having more difficulties with His back and weakness in his legs.  DDD of the lumbar spine - I have not seen his MRI or consultation to Ortho but I assume he has high grade stenosis which is giving him problems.  Follow up with Orthopedics.  CAD (coronary artery disease) of artery bypass graft - Plan: Ambulatory referral to Cardiology

## 2013-06-22 NOTE — Patient Instructions (Signed)
Back Pain, Adult  Low back pain is very common. About 1 in 5 people have back pain. The cause of low back pain is rarely dangerous. The pain often gets better over time. About half of people with a sudden onset of back pain feel better in just 2 weeks. About 8 in 10 people feel better by 6 weeks.   CAUSES  Some common causes of back pain include:  · Strain of the muscles or ligaments supporting the spine.  · Wear and tear (degeneration) of the spinal discs.  · Arthritis.  · Direct injury to the back.  DIAGNOSIS  Most of the time, the direct cause of low back pain is not known. However, back pain can be treated effectively even when the exact cause of the pain is unknown. Answering your caregiver's questions about your overall health and symptoms is one of the most accurate ways to make sure the cause of your pain is not dangerous. If your caregiver needs more information, he or she may order lab work or imaging tests (X-rays or MRIs). However, even if imaging tests show changes in your back, this usually does not require surgery.  HOME CARE INSTRUCTIONS  For many people, back pain returns. Since low back pain is rarely dangerous, it is often a condition that people can learn to manage on their own.   · Remain active. It is stressful on the back to sit or stand in one place. Do not sit, drive, or stand in one place for more than 30 minutes at a time. Take short walks on level surfaces as soon as pain allows. Try to increase the length of time you walk each day.  · Do not stay in bed. Resting more than 1 or 2 days can delay your recovery.  · Do not avoid exercise or work. Your body is made to move. It is not dangerous to be active, even though your back may hurt. Your back will likely heal faster if you return to being active before your pain is gone.  · Pay attention to your body when you  bend and lift. Many people have less discomfort when lifting if they bend their knees, keep the load close to their bodies, and  avoid twisting. Often, the most comfortable positions are those that put less stress on your recovering back.  · Find a comfortable position to sleep. Use a firm mattress and lie on your side with your knees slightly bent. If you lie on your back, put a pillow under your knees.  · Only take over-the-counter or prescription medicines as directed by your caregiver. Over-the-counter medicines to reduce pain and inflammation are often the most helpful. Your caregiver may prescribe muscle relaxant drugs. These medicines help dull your pain so you can more quickly return to your normal activities and healthy exercise.  · Put ice on the injured area.  · Put ice in a plastic bag.  · Place a towel between your skin and the bag.  · Leave the ice on for 15-20 minutes, 3-4 times a day for the first 2 to 3 days. After that, ice and heat may be alternated to reduce pain and spasms.  · Ask your caregiver about trying back exercises and gentle massage. This may be of some benefit.  · Avoid feeling anxious or stressed. Stress increases muscle tension and can worsen back pain. It is important to recognize when you are anxious or stressed and learn ways to manage it. Exercise is a great option.  SEEK MEDICAL CARE IF:  · You have pain that is not relieved with rest or   medicine.  · You have pain that does not improve in 1 week.  · You have new symptoms.  · You are generally not feeling well.  SEEK IMMEDIATE MEDICAL CARE IF:   · You have pain that radiates from your back into your legs.  · You develop new bowel or bladder control problems.  · You have unusual weakness or numbness in your arms or legs.  · You develop nausea or vomiting.  · You develop abdominal pain.  · You feel faint.  Document Released: 10/01/2005 Document Revised: 04/01/2012 Document Reviewed: 02/19/2011  Exit[employer]® Patient Information ©2014 [employer].

## 2013-06-29 ENCOUNTER — Ambulatory Visit (INDEPENDENT_AMBULATORY_CARE_PROVIDER_SITE_OTHER): Payer: Medicaid Other | Admitting: Pharmacist

## 2013-06-29 ENCOUNTER — Other Ambulatory Visit: Payer: Self-pay | Admitting: Family Medicine

## 2013-06-29 VITALS — BP 132/88 | HR 58 | Ht 66.0 in | Wt 168.5 lb

## 2013-06-29 DIAGNOSIS — E1165 Type 2 diabetes mellitus with hyperglycemia: Secondary | ICD-10-CM

## 2013-06-29 DIAGNOSIS — E119 Type 2 diabetes mellitus without complications: Secondary | ICD-10-CM

## 2013-06-29 DIAGNOSIS — I2119 ST elevation (STEMI) myocardial infarction involving other coronary artery of inferior wall: Secondary | ICD-10-CM

## 2013-06-29 DIAGNOSIS — I10 Essential (primary) hypertension: Secondary | ICD-10-CM

## 2013-06-29 NOTE — Patient Instructions (Addendum)
Increase Lantus to 12 units for 2 days.  If you don't have any low blood glucose/sugar readings then increase to 14 units at bedtime.  Continue Novolog 20 units with meals.  Clopidogrel 75mg  take 1 tablet daily  Metoprolol 25mg  take 1/2 tablet twice a day with food.

## 2013-06-29 NOTE — Progress Notes (Signed)
Diabetes Flow Sheet:  Visit 1  Chief Complaint:   Chief Complaint  Patient presents with  . Diabetes    Filed Vitals:   06/29/13 0922  BP: 132/88  Pulse: 58  Heart Rate - Regular rate and rhythm  HPI:  Ethan Peters was diagnosed with type 2DM in 2002.  He was last seen by myself about 1 month ago for insulin adjustment and diabetic education.  His last A1c was 6.3% in August 2014.      In March Ethan Peters also suffered a MI and had a stent placed.  He was initially placed on Brintellix BID but this was later changed to clopidogrel 75mg  daily.  However, patient has been taking clopidogrel BID.  He is also taking ASA and metoprolol 25mg  1/2 tablet bid following his stent / MI.   Current DM medications are Lantus 10 units at bedtime, Novolog 20 units tid prior to meals although patient sometimes only eats 2 meals a day and still gives 3 injections of Novolog and metformin 1000mg  1 tablet bid  HBG readings - checks 3-4 times daily.  He brings in glucometer today and readings are as follows:  165, 166, 189, 164, 101, 86, 189, 149, 121, 147, 61 (during night), 130, 139, 131, 201, 64(during day), 163, 212.  He reports that hypoglycemic events have decreased in frequency since last visit when he was instructed not to take Novolog if not eating a meal.     Exam Edema:  negative Polyuria:  negative  Polydipsia:  negative Polyphagia:  negative  BMI:  Body mass index is 27.21 kg/(m^2).   Weight changes:  stable General Appearance:  alert, oriented, no acute distress and well nourished, muscular build Mood/Affect:  normal, NAD  Low fat/carbohydrate diet?  Yes Nicotine Abuse?  No Medication Compliance?  Yes - but some mix up in dosing instructions Exercise?  Yes Alcohol Abuse?  No   Lab Results  Component Value Date   HGBA1C 6.3 06/01/2013    Lab Results  Component Value Date   CHOL 104 06/01/2013   HDL 52 01/10/2013   LDLCALC 87 01/10/2013   TRIG 94 01/10/2013   CHOLHDL 3.0 01/10/2013      Medication Checklist: ACE Inhibitor/ARB?  No Lipid Lowering Agent?  Yes Aspirin?  Yes Oral Hypoglycemic Agent(s)?  No  Assessment: 1.  type 2 Diabetes.  Improving control but some highs 2.  Blood Pressure Control.  good 3.  Lipid Control.  At goals.   Recommendations: 1.  1500 calorie, carbohydrate counting diet.  Patient is counseled extensively on carbohydrate counting, serving sizes, saturated fat intake and meal planning.  Patient is instructed to eat 3 meals a day and 3 small snacks.  Patient will supplement snacks based on physical activity. 2.  15 to 30 minutes of physical activity as tolerated (patien to monitor for CP or back pain and stop if experiences either) .  Patient is counseled to always carry glucose tablets, lifesavers, hard candies, etc., while exercising in case of hypoglycemic event.. 3.  LDL goal of < 100, HDL > 40 and TG < 150; BP goal < 140/85 5.  Patient is counseled on proper use of glucometer and lancing device.  Patient is instructed to continue to check BG 3-4 times daily and how to respond to unsuitable results. 6.  Medication recommendations at this time are as follows:   Increase Lantus to 12 units for 2 days.  If you don't have any low blood glucose/sugar readings then  increase to 14 units at bedtime.  Continue Novolog 20 units with meals.  Clopidogrel 75mg  take 1 tablet daily  Metoprolol 25mg  take 1/2 tablet twice a day with food.  9.  RTC in 1 month     Time spent counseling patient:  60 minutes       Henrene Pastor, PharmD, CPP

## 2013-07-08 ENCOUNTER — Telehealth: Payer: Self-pay

## 2013-07-08 NOTE — Telephone Encounter (Signed)
Dr.Jordan received a letter from [facility] requesting stopping Plavix due to disabling symptoms secondary to spinal stenosis.Dr.Jordan advised patient needs to continue Plavix until 12/2013.Dr.Beane's office was called notified.

## 2013-08-13 ENCOUNTER — Ambulatory Visit: Payer: Medicaid Other | Admitting: Cardiology

## 2013-08-31 ENCOUNTER — Ambulatory Visit: Payer: Self-pay

## 2014-04-19 ENCOUNTER — Encounter: Payer: Self-pay | Admitting: Cardiology

## 2014-04-19 ENCOUNTER — Ambulatory Visit (INDEPENDENT_AMBULATORY_CARE_PROVIDER_SITE_OTHER): Payer: BC Managed Care – PPO | Admitting: Cardiology

## 2014-04-19 VITALS — BP 148/86 | HR 70 | Ht 66.0 in | Wt 180.9 lb

## 2014-04-19 DIAGNOSIS — R072 Precordial pain: Secondary | ICD-10-CM

## 2014-04-19 DIAGNOSIS — I2510 Atherosclerotic heart disease of native coronary artery without angina pectoris: Secondary | ICD-10-CM

## 2014-04-19 DIAGNOSIS — I10 Essential (primary) hypertension: Secondary | ICD-10-CM

## 2014-04-19 MED ORDER — ATORVASTATIN CALCIUM 80 MG PO TABS: 80.0000 mg | ORAL_TABLET | Freq: Every day | ORAL | Status: DC

## 2014-04-19 MED ORDER — METOPROLOL TARTRATE 25 MG PO TABS: 12.5000 mg | ORAL_TABLET | Freq: Two times a day (BID) | ORAL | Status: DC

## 2014-04-19 MED ORDER — CLOPIDOGREL BISULFATE 75 MG PO TABS: 75.0000 mg | ORAL_TABLET | Freq: Every day | ORAL | Status: DC

## 2014-04-19 NOTE — Patient Instructions (Signed)
We will refill your medication.  I will see you in 6 months.

## 2014-04-20 NOTE — Progress Notes (Signed)
Ethan Peters Date of Birth: [birth year] Medical Record #[id number]  History of Present Illness: [name] is seen for followup today. He is status post inferior STEMI on 01/09/2013. He had stenting of the RCA at the crux. He had persistent chest pain and dyspnea even after his infarct with atypical symptoms. He subsequently underwent repeat cardiac catheterization on April 10,2014 which showed excellent patency of the stent in the RCA. He does have a long 70% stenosis in the left circumflex  treated medically. He had a Myovew study in July 2014 which showed an inferior scar without ischemia. EF was normal.  He has a history of poorly controlled diabetes mellitus with retinopathy. He has chronic pain in his left rib cage that is reproduced with palpation and is worse with movement and cough. He ran out of his medications 15 days ago.   Current Outpatient Prescriptions on File Prior to Visit  Medication Sig Dispense Refill  . allopurinol (ZYLOPRIM) 300 MG tablet Take 300 mg by mouth daily as needed (gout pain).      Marland Kitchen aspirin EC 81 MG EC tablet Take 1 tablet (81 mg total) by mouth daily.      . B Complex-C (B-COMPLEX WITH VITAMIN C) tablet Take 1 tablet by mouth as needed (for vitamin deficiency).      Marland Kitchen GLUCOSAMINE PO Take 1 capsule by mouth daily.      Marland Kitchen glucose blood test strip Use as instructed  100 each  12  . Insulin Syringes, Disposable, U-100 0.3 ML MISC 60 Units/day by Does not apply route 4 (four) times daily.  100 each  5  . Lancets (ACCU-CHEK MULTICLIX) lancets Use as instructed  100 each  12  . ALPRAZolam (XANAX) 0.25 MG tablet Take 1 tablet (0.25 mg total) by mouth 3 (three) times daily as needed for anxiety.  30 tablet  3  . HYDROcodone-acetaminophen (NORCO) 10-325 MG per tablet Take 1 tablet by mouth every 8 (eight) hours as needed for pain.  30 tablet  1  . insulin aspart (NOVOLOG) 100 UNIT/ML injection Inject 20 Units into the skin 3 (three) times daily with meals.      .  insulin glargine (LANTUS) 100 UNIT/ML injection Inject 10 Units into the skin at bedtime.      . metFORMIN (GLUCOPHAGE) 1000 MG tablet Take 1,000 mg by mouth 2 (two) times daily with a meal.      . nitroGLYCERIN (NITROSTAT) 0.4 MG SL tablet Place 1 tablet (0.4 mg total) under the tongue every 5 (five) minutes x 3 doses as needed for chest pain.  25 tablet  3   No current facility-administered medications on file prior to visit.    Allergies  Allergen Reactions  . Penicillins Rash    Lip swelling    Past Medical History  Diagnosis Date  . Hypertension   . Diabetes mellitus     Uncontrolled, Hgb A1C 14.8% on 03/14, started on insulin  . CAD (coronary artery disease) 01/09/13    Inferior STEMI s/p DES-RCA  . Ischemic cardiomyopathy     EF 45-50%, grade 1 diastolic dysfunction, mildly dilated RV, RA at the upper limits of normal, mild TR, PA systolic pressure 32 mm mercury and hypokinesis to akinesis of the basal mid inferior myocardium  . Myocardial infarction 01/09/2013  . Shortness of breath   . Gout     Past Surgical History  Procedure Laterality Date  . Coronary angioplasty with stent placement  01/09/2013  30% pLAD, 30% mLAD, 70% pLCx, RCA occlusion at crux with R->L distal collaterals s/p DES; LVEF 50%, moderate-severe HK of inferior basal wall  . Cardiac catheterization  01/22/2013    Diffuse borderline residual CAD consistent with uncontrolled diabetes, medical management recommended    History  Smoking status  . Never Smoker   Smokeless tobacco  . Never Used    History  Alcohol Use  . Yes    Comment: i have not drank in a year "    Family History  Problem Relation Age of Onset  . Heart disease Mother   . Heart disease Father     Review of Systems: As noted in history of present illness.  All other systems were reviewed and are negative.  Physical Exam: BP 148/86  Pulse 70  Ht 5\' 6"  (1.676 m)  Wt 180 lb 14.4 oz (82.056 kg)  BMI 29.21 kg/m2 He is a  pleasant Latino male in no acute distress. HEENT: Normal. Lungs: Clear Cardiovascular: Regular rate and rhythm without gallop, murmur, or click. He has slight chest wall pain to palpation beneath the left breast.  Abdomen: Soft and nontender. No masses or bruits. Bowel sounds are positive. Extremities: No edema. Radial and pedal pulses are 2+. Skin: Warm and dry Neuro: Alert and oriented x3. Normal motor and sensory exam.  LABORATORY DATA: Ecg: NSR with occ PVC. Old inferior infarct. Since 01/21/13 inferior T wave inversion resolved.   Assessment / Plan: 1. Coronary disease status post inferior STEMI treated with DES to the RCA. Repeat cardiac catheterization demonstrated continued patency. Moderate diffuse disease in the left circumflex. No ischemia by Myoview. We will continue aspirin. I think his current chest pain is more musculoskeletal. 2. Diabetes mellitus: On metformin and insulin. Needs close followup with primary care to obtain better diabetic control. May need to consider referral to endocrinology. 3. Dyslipidemia. On statin therapy. 4. Hypertension, BP elevated due to running out of meds. Medication refilled.  5. Diabetic retinopathy.   [name] has poor insight into his multiple medical problems. Will continue to reinforce medication and lifestyle modification.

## 2014-08-06 ENCOUNTER — Telehealth: Payer: Self-pay | Admitting: Cardiology

## 2014-08-06 NOTE — Telephone Encounter (Signed)
Close encounter 

## 2014-09-16 ENCOUNTER — Ambulatory Visit: Payer: Medicaid Other | Admitting: Family Medicine

## 2014-09-22 ENCOUNTER — Encounter (INDEPENDENT_AMBULATORY_CARE_PROVIDER_SITE_OTHER): Payer: Self-pay

## 2014-09-22 ENCOUNTER — Ambulatory Visit (INDEPENDENT_AMBULATORY_CARE_PROVIDER_SITE_OTHER): Payer: Medicaid Other | Admitting: Family Medicine

## 2014-09-22 ENCOUNTER — Encounter: Payer: Self-pay | Admitting: Family Medicine

## 2014-09-22 VITALS — BP 145/81 | HR 73 | Temp 97.9°F | Ht 67.0 in | Wt 181.0 lb

## 2014-09-22 DIAGNOSIS — Z139 Encounter for screening, unspecified: Secondary | ICD-10-CM

## 2014-09-22 DIAGNOSIS — E1165 Type 2 diabetes mellitus with hyperglycemia: Secondary | ICD-10-CM

## 2014-09-22 DIAGNOSIS — R5383 Other fatigue: Secondary | ICD-10-CM

## 2014-09-22 DIAGNOSIS — E785 Hyperlipidemia, unspecified: Secondary | ICD-10-CM

## 2014-09-22 DIAGNOSIS — E119 Type 2 diabetes mellitus without complications: Secondary | ICD-10-CM

## 2014-09-22 DIAGNOSIS — I10 Essential (primary) hypertension: Secondary | ICD-10-CM

## 2014-09-22 LAB — POCT CBC
Granulocyte percent: 62.9 %G (ref 37–80)
HCT, POC: 44.8 % (ref 43.5–53.7)
Hemoglobin: 14.3 g/dL (ref 14.1–18.1)
Lymph, poc: 2.3 (ref 0.6–3.4)
MCH, POC: 29.5 pg (ref 27–31.2)
MCHC: 32 g/dL (ref 31.8–35.4)
MCV: 92.3 fL (ref 80–97)
MPV: 9.7 fL (ref 0–99.8)
POC Granulocyte: 4.3 (ref 2–6.9)
POC LYMPH PERCENT: 34 %L (ref 10–50)
Platelet Count, POC: 209 10*3/uL (ref 142–424)
RBC: 4.9 M/uL (ref 4.69–6.13)
RDW, POC: 12.6 %
WBC: 6.8 10*3/uL (ref 4.6–10.2)

## 2014-09-22 LAB — POCT GLYCOSYLATED HEMOGLOBIN (HGB A1C): Hemoglobin A1C: 7.3

## 2014-09-22 MED ORDER — SERTRALINE HCL 50 MG PO TABS: 50.0000 mg | ORAL_TABLET | Freq: Every day | ORAL | Status: DC

## 2014-09-22 MED ORDER — METFORMIN HCL 1000 MG PO TABS: 1000.0000 mg | ORAL_TABLET | Freq: Two times a day (BID) | ORAL | Status: DC

## 2014-09-22 MED ORDER — ALLOPURINOL 300 MG PO TABS: 300.0000 mg | ORAL_TABLET | Freq: Every day | ORAL | Status: DC | PRN

## 2014-09-22 NOTE — Progress Notes (Signed)
   Subjective:    Patient ID: Ethan Peters, male    DOB: 11/01/1955, 58 y.o.   MRN: [id number]  HPI Patient is here for follow up.  He has hx of diabetes.  He has been having more depression sx's and he states he has some memory loss.  Review of Systems  Constitutional: Negative for fever.  HENT: Negative for ear pain.   Eyes: Negative for discharge.  Respiratory: Negative for cough.   Cardiovascular: Negative for chest pain.  Gastrointestinal: Negative for abdominal distention.  Endocrine: Negative for polyuria.  Genitourinary: Negative for difficulty urinating.  Musculoskeletal: Negative for gait problem and neck pain.  Skin: Negative for color change and rash.  Neurological: Negative for speech difficulty and headaches.  Psychiatric/Behavioral: Negative for agitation.       Objective:    BP 145/81 mmHg  Pulse 73  Temp(Src) 97.9 F (36.6 C) (Oral)  Ht _0  (1.702 m)  Wt 181 lb (82.101 kg)  BMI 28.34 kg/m2 Physical Exam  Constitutional: He is oriented to person, place, and time. He appears well-developed and well-nourished.  HENT:  Head: Normocephalic and atraumatic.  Mouth/Throat: Oropharynx is clear and moist.  Eyes: Pupils are equal, round, and reactive to light.  Neck: Normal range of motion. Neck supple.  Cardiovascular: Normal rate and regular rhythm.   No murmur heard. Pulmonary/Chest: Effort normal and breath sounds normal.  Abdominal: Soft. Bowel sounds are normal. There is no tenderness.  Neurological: He is alert and oriented to person, place, and time.  Skin: Skin is warm and dry.  Psychiatric: He has a normal mood and affect.          Assessment & Plan:     ICD-9-CM ICD-10-CM   1. Poorly controlled type 2 diabetes mellitus 250.00 E11.9 POCT glycosylated hemoglobin (Hb A1C)     CMP14+EGFR     POCT UA - Microalbumin  2. Essential hypertension 401.9 I10 POCT CBC  3. Hyperlipemia 272.4 E78.5 CMP14+EGFR     Lipid panel  4. Other fatigue  780.79 R53.83 POCT CBC     Thyroid Panel With TSH  5. Screening V82.9 Z13.9 PSA, total and free     No Follow-up on file.  Lysbeth Penner FNP

## 2014-09-22 NOTE — Addendum Note (Signed)
Addended by: Prescott Gum on: 09/22/2014 10:04 AM   Modules accepted: Orders

## 2014-09-23 ENCOUNTER — Encounter (HOSPITAL_COMMUNITY): Payer: Self-pay | Admitting: Cardiology

## 2014-09-23 LAB — CMP14+EGFR
ALT: 16 IU/L (ref 0–44)
AST: 19 IU/L (ref 0–40)
Albumin/Globulin Ratio: 1.6 (ref 1.1–2.5)
Albumin: 4.8 g/dL (ref 3.5–5.5)
Alkaline Phosphatase: 104 IU/L (ref 39–117)
BUN/Creatinine Ratio: 13 (ref 9–20)
BUN: 15 mg/dL (ref 6–24)
CO2: 24 mmol/L (ref 18–29)
Calcium: 9.7 mg/dL (ref 8.7–10.2)
Chloride: 99 mmol/L (ref 97–108)
Creatinine, Ser: 1.16 mg/dL (ref 0.76–1.27)
GFR calc Af Amer: 79 mL/min/{1.73_m2} (ref >59)
GFR calc non Af Amer: 69 mL/min/{1.73_m2} (ref >59)
Globulin, Total: 3 g/dL (ref 1.5–4.5)
Glucose: 219 mg/dL — ABNORMAL HIGH (ref 65–99)
Potassium: 5.4 mmol/L — ABNORMAL HIGH (ref 3.5–5.2)
Sodium: 139 mmol/L (ref 134–144)
Total Bilirubin: 1.2 mg/dL (ref 0.0–1.2)
Total Protein: 7.8 g/dL (ref 6.0–8.5)

## 2014-09-23 LAB — THYROID PANEL WITH TSH
Free Thyroxine Index: 2.2 (ref 1.2–4.9)
T3 Uptake Ratio: 32 % (ref 24–39)
T4, Total: 6.8 ug/dL (ref 4.5–12.0)
TSH: 2.88 u[IU]/mL (ref 0.450–4.500)

## 2014-09-23 LAB — LIPID PANEL
Chol/HDL Ratio: 3.8 ratio units (ref 0.0–5.0)
Cholesterol, Total: 195 mg/dL (ref 100–199)
HDL: 51 mg/dL (ref >39)
LDL Calculated: 101 mg/dL — ABNORMAL HIGH (ref 0–99)
Triglycerides: 215 mg/dL — ABNORMAL HIGH (ref 0–149)
VLDL Cholesterol Cal: 43 mg/dL — ABNORMAL HIGH (ref 5–40)

## 2014-09-23 LAB — PSA, TOTAL AND FREE
PSA, Free Pct: 26.7 %
PSA, Free: 0.16 ng/mL
PSA: 0.6 ng/mL (ref 0.0–4.0)

## 2014-09-24 ENCOUNTER — Other Ambulatory Visit: Payer: Self-pay | Admitting: Family Medicine

## 2014-09-24 MED ORDER — GLIPIZIDE 5 MG PO TABS: 5.0000 mg | ORAL_TABLET | Freq: Every day | ORAL | Status: DC

## 2014-09-27 ENCOUNTER — Telehealth: Payer: Self-pay | Admitting: *Deleted

## 2014-09-27 NOTE — Telephone Encounter (Signed)
He will begin the glipizide.  He was told by another doctor not to take fish oil, it is bad for prostate.

## 2014-09-27 NOTE — Telephone Encounter (Signed)
-----   Message from Deatra Canter, FNP sent at 09/24/2014  9:53 AM EST ----- Diabetes uncontrolled and glipizide 5mg  po qd sent to pharmacy.  Lipid panel shows elevated trigs and recommend fish oil otc

## 2014-10-18 ENCOUNTER — Ambulatory Visit (INDEPENDENT_AMBULATORY_CARE_PROVIDER_SITE_OTHER): Payer: BC Managed Care – PPO | Admitting: Cardiology

## 2014-10-18 ENCOUNTER — Encounter: Payer: Self-pay | Admitting: Cardiology

## 2014-10-18 VITALS — BP 148/90 | HR 78 | Ht 66.0 in | Wt 184.0 lb

## 2014-10-18 DIAGNOSIS — I2510 Atherosclerotic heart disease of native coronary artery without angina pectoris: Secondary | ICD-10-CM

## 2014-10-18 DIAGNOSIS — E119 Type 2 diabetes mellitus without complications: Secondary | ICD-10-CM

## 2014-10-18 DIAGNOSIS — E1165 Type 2 diabetes mellitus with hyperglycemia: Secondary | ICD-10-CM

## 2014-10-18 DIAGNOSIS — I10 Essential (primary) hypertension: Secondary | ICD-10-CM

## 2014-10-18 NOTE — Progress Notes (Signed)
Ethan Peters Date of Birth: May 09, 1956 Medical Record #[id number]  History of Present Illness: [name] is seen for followup of CAD. He is status post inferior STEMI on 01/09/2013. He had stenting of the RCA at the crux. He had persistent chest pain and dyspnea even after his infarct with atypical symptoms. He subsequently underwent repeat cardiac catheterization on April 10,2014 which showed excellent patency of the stent in the RCA. He does have a long 70% stenosis in the left circumflex,  treated medically. He had a Myovew study in July 2014 which showed an inferior scar without ischemia. EF was normal.  He has a history of poorly controlled diabetes mellitus with retinopathy. He was started on glipizide in early December in addition to metformin and insulin. Since then he has experienced episodes of hypoglycemia at night. He complains of being tired all the time. He stays on his couch all day. He does not exercise. He is depressed. He is on Zoloft but doesn't think it is helping. The only time he has chest pain now is when he swallows something solid and it hangs up in his esophagus.    Current Outpatient Prescriptions on File Prior to Visit  Medication Sig Dispense Refill  . allopurinol (ZYLOPRIM) 300 MG tablet Take 1 tablet (300 mg total) by mouth daily as needed (gout pain). 90 tablet 3  . ALPRAZolam (XANAX) 0.25 MG tablet Take 1 tablet (0.25 mg total) by mouth 3 (three) times daily as needed for anxiety. 30 tablet 3  . aspirin EC 81 MG EC tablet Take 1 tablet (81 mg total) by mouth daily.    Marland Kitchen atorvastatin (LIPITOR) 80 MG tablet Take 1 tablet (80 mg total) by mouth daily at 6 PM. For cholesterol 90 tablet 3  . B Complex-C (B-COMPLEX WITH VITAMIN C) tablet Take 1 tablet by mouth as needed (for vitamin deficiency).    . clopidogrel (PLAVIX) 75 MG tablet Take 1 tablet (75 mg total) by mouth daily. 90 tablet 3  . glipiZIDE (GLUCOTROL) 5 MG tablet Take 1 tablet (5 mg total) by mouth daily  before breakfast. 30 tablet 11  . GLUCOSAMINE PO Take 1 capsule by mouth as needed.     Marland Kitchen glucose blood test strip Use as instructed 100 each 12  . HYDROcodone-acetaminophen (NORCO) 10-325 MG per tablet Take 1 tablet by mouth every 8 (eight) hours as needed for pain. 30 tablet 1  . insulin aspart (NOVOLOG) 100 UNIT/ML injection Inject 20 Units into the skin 3 (three) times daily with meals.    . insulin glargine (LANTUS) 100 UNIT/ML injection Inject 10 Units into the skin at bedtime.    . Insulin Syringes, Disposable, U-100 0.3 ML MISC 60 Units/day by Does not apply route 4 (four) times daily. 100 each 5  . Lancets (ACCU-CHEK MULTICLIX) lancets Use as instructed 100 each 12  . metFORMIN (GLUCOPHAGE) 1000 MG tablet Take 1 tablet (1,000 mg total) by mouth 2 (two) times daily with a meal. 180 tablet 3  . metoprolol tartrate (LOPRESSOR) 25 MG tablet Take 0.5 tablets (12.5 mg total) by mouth 2 (two) times daily. 180 tablet 3  . nitroGLYCERIN (NITROSTAT) 0.4 MG SL tablet Place 1 tablet (0.4 mg total) under the tongue every 5 (five) minutes x 3 doses as needed for chest pain. 25 tablet 3  . sertraline (ZOLOFT) 50 MG tablet Take 1 tablet (50 mg total) by mouth daily. 30 tablet 3   No current facility-administered medications on file prior to visit.  Allergies  Allergen Reactions  . Penicillins Rash    Lip swelling    Past Medical History  Diagnosis Date  . Hypertension   . Diabetes mellitus     Uncontrolled, Hgb A1C 14.8% on 03/14, started on insulin  . CAD (coronary artery disease) 01/09/13    Inferior STEMI s/p DES-RCA  . Ischemic cardiomyopathy     EF 45-50%, grade 1 diastolic dysfunction, mildly dilated RV, RA at the upper limits of normal, mild TR, PA systolic pressure 32 mm mercury and hypokinesis to akinesis of the basal mid inferior myocardium  . Myocardial infarction 01/09/2013  . Shortness of breath   . Gout     Past Surgical History  Procedure Laterality Date  . Coronary  angioplasty with stent placement  01/09/2013    30% pLAD, 30% mLAD, 70% pLCx, RCA occlusion at crux with R->L distal collaterals s/p DES; LVEF 50%, moderate-severe HK of inferior basal wall  . Cardiac catheterization  01/22/2013    Diffuse borderline residual CAD consistent with uncontrolled diabetes, medical management recommended  . Left heart catheterization with coronary angiogram N/A 01/09/2013    Procedure: LEFT HEART CATHETERIZATION WITH CORONARY ANGIOGRAM;  Surgeon: Zali Kamaka M [place], MD;  Location: [facility] CATH LAB;  Service: Cardiovascular;  Laterality: N/A;  . Left heart catheterization with coronary angiogram N/A 01/22/2013    Procedure: LEFT HEART CATHETERIZATION WITH CORONARY ANGIOGRAM;  Surgeon: Tonny Bollman, MD;  Location: [facility] CATH LAB;  Service: Cardiovascular;  Laterality: N/A;    History  Smoking status  . Never Smoker   Smokeless tobacco  . Never Used    History  Alcohol Use  . Yes    Comment: i have not drank in a year "    Family History  Problem Relation Age of Onset  . Heart disease Mother   . Heart disease Father     Review of Systems: As noted in history of present illness.  All other systems were reviewed and are negative.  Physical Exam: BP 148/90 mmHg  Pulse 78  Ht  (1.676 m)  Wt 184 lb (83.462 kg)  BMI 29.71 kg/m2 He is a pleasant Latino male in no acute distress. HEENT: Normal. Lungs: Clear Cardiovascular: Regular rate and rhythm without gallop, murmur, or click. He has no chest wall pain to palpation.  Abdomen: Soft and nontender. No masses or bruits. Bowel sounds are positive. Extremities: No edema. Radial and pedal pulses are 2+. Skin: Warm and dry Neuro: Alert and oriented x3. Normal motor and sensory exam.  LABORATORY DATA: Lab Results  Component Value Date   WBC 6.8 09/22/2014   HGB 14.3 09/22/2014   HCT 44.8 09/22/2014   PLT 313 01/21/2013   GLUCOSE 219* 09/22/2014   CHOL 104 06/01/2013   TRIG 215* 09/22/2014   HDL 51  09/22/2014   LDLCALC 101* 09/22/2014   ALT 16 09/22/2014   AST 19 09/22/2014   NA 139 09/22/2014   K 5.4* 09/22/2014   CL 99 09/22/2014   CREATININE 1.16 09/22/2014   BUN 15 09/22/2014   CO2 24 09/22/2014   TSH 2.880 09/22/2014   PSA 0.6 09/22/2014   INR 1.16 01/21/2013   HGBA1C 7.3% 09/22/2014     Assessment / Plan: 1. Coronary disease status post inferior STEMI treated with DES to the RCA. Repeat cardiac catheterization demonstrated continued patency. Moderate diffuse disease in the left circumflex. No ischemia by [facility] July 2014. We will continue aspirin and statin. He is currently asymptomatic. I will follow  up in 6 months.   2. Diabetes mellitus: On metformin, glipizide, and insulin . He is having some nocturnal hypoglycemia. Recommend he follow up with primary care.  3. Dyslipidemia. On statin therapy.  4. Hypertension, BP mildly elevated. On metoprolol. Will monitor. If it remains elevated consider adding ACEi.  5. Diabetic retinopathy.   6. Depression. On Zoloft. Encouraged increased aerobic activity. Needs to follow up with primary care.

## 2014-10-18 NOTE — Patient Instructions (Signed)
You need to walk 30 minutes a day  You need to follow up with your primary care about your diabetes and depression.  I will see you in 6 months

## 2014-10-29 ENCOUNTER — Other Ambulatory Visit: Payer: Self-pay | Admitting: Family Medicine

## 2014-10-30 ENCOUNTER — Telehealth: Payer: Self-pay | Admitting: Family Medicine

## 2014-10-30 MED ORDER — GLUCOSE BLOOD VI STRP: ORAL_STRIP | Status: DC

## 2014-10-30 MED ORDER — ACCU-CHEK MULTICLIX LANCETS MISC: Status: DC

## 2014-10-30 NOTE — Telephone Encounter (Signed)
This message is being handle in another telephone encounter.

## 2014-10-30 NOTE — Telephone Encounter (Signed)
Patient test tid. Rx sent to pharmacy

## 2014-10-30 NOTE — Addendum Note (Signed)
Addended by: Tamera Punt on: 10/30/2014 09:51 AM   Modules accepted: Orders

## 2014-10-30 NOTE — Telephone Encounter (Signed)
Lmtcb needing to know how many times a day he test.

## 2014-11-02 ENCOUNTER — Other Ambulatory Visit: Payer: Self-pay | Admitting: *Deleted

## 2014-11-02 ENCOUNTER — Encounter: Payer: Self-pay | Admitting: Family Medicine

## 2014-11-02 ENCOUNTER — Telehealth: Payer: Self-pay | Admitting: Family Medicine

## 2014-11-02 ENCOUNTER — Telehealth: Payer: Self-pay | Admitting: *Deleted

## 2014-11-02 ENCOUNTER — Ambulatory Visit (INDEPENDENT_AMBULATORY_CARE_PROVIDER_SITE_OTHER): Payer: Medicare Other | Admitting: Family Medicine

## 2014-11-02 VITALS — BP 126/79 | HR 68 | Temp 97.3°F | Ht 66.0 in | Wt 186.4 lb

## 2014-11-02 DIAGNOSIS — N528 Other male erectile dysfunction: Secondary | ICD-10-CM

## 2014-11-02 DIAGNOSIS — E1165 Type 2 diabetes mellitus with hyperglycemia: Secondary | ICD-10-CM

## 2014-11-02 DIAGNOSIS — E1342 Other specified diabetes mellitus with diabetic polyneuropathy: Secondary | ICD-10-CM

## 2014-11-02 DIAGNOSIS — E1142 Type 2 diabetes mellitus with diabetic polyneuropathy: Secondary | ICD-10-CM

## 2014-11-02 DIAGNOSIS — IMO0001 Reserved for inherently not codable concepts without codable children: Secondary | ICD-10-CM

## 2014-11-02 DIAGNOSIS — I252 Old myocardial infarction: Secondary | ICD-10-CM

## 2014-11-02 DIAGNOSIS — E11649 Type 2 diabetes mellitus with hypoglycemia without coma: Secondary | ICD-10-CM

## 2014-11-02 DIAGNOSIS — G629 Polyneuropathy, unspecified: Secondary | ICD-10-CM

## 2014-11-02 DIAGNOSIS — E119 Type 2 diabetes mellitus without complications: Secondary | ICD-10-CM

## 2014-11-02 DIAGNOSIS — I25119 Atherosclerotic heart disease of native coronary artery with unspecified angina pectoris: Secondary | ICD-10-CM

## 2014-11-02 LAB — POCT UA - MICROALBUMIN: MICROALBUMIN (UR) POC: NEGATIVE mg/L

## 2014-11-02 MED ORDER — ACCU-CHEK MULTICLIX LANCETS MISC: Status: DC

## 2014-11-02 MED ORDER — GLUCOSE BLOOD VI STRP: ORAL_STRIP | Status: DC

## 2014-11-02 MED ORDER — GABAPENTIN 300 MG PO CAPS: 300.0000 mg | ORAL_CAPSULE | Freq: Every day | ORAL | Status: DC

## 2014-11-02 MED ORDER — SILDENAFIL CITRATE 50 MG PO TABS: 50.0000 mg | ORAL_TABLET | Freq: Every day | ORAL | Status: DC | PRN

## 2014-11-02 NOTE — Patient Instructions (Addendum)
Check glucose 4 times a day.  Discontinue glipizide Discontinue Viagra if you develop chest pain. Do not take viagra within 24 hours of nitroglycerin

## 2014-11-02 NOTE — Progress Notes (Signed)
Subjective:    Patient ID: Ethan Peters, male    DOB: 11-02-1955, 59 y.o.   MRN: [id number]  HPI Patient is here today for follow up of chronic medical problems which include diabetes, hypertension, depression and CAD. Currently asymptomatic from each concern, but would like to be treated for ED. Onset after MI 2 years ago.   Has daily low glucose on current regimen has to eat due to feeling of nausea and dazed sensation.No HA. No polyuria or polydipsia  Stable on meds. Denies side effects.   Current Outpatient Prescriptions on File Prior to Visit  Medication Sig Dispense Refill  . allopurinol (ZYLOPRIM) 300 MG tablet Take 1 tablet (300 mg total) by mouth daily as needed (gout pain). 90 tablet 3  . ALPRAZolam (XANAX) 0.25 MG tablet Take 1 tablet (0.25 mg total) by mouth 3 (three) times daily as needed for anxiety. 30 tablet 3  . aspirin EC 81 MG EC tablet Take 1 tablet (81 mg total) by mouth daily.    Marland Kitchen atorvastatin (LIPITOR) 80 MG tablet Take 1 tablet (80 mg total) by mouth daily at 6 PM. For cholesterol 90 tablet 3  . B Complex-C (B-COMPLEX WITH VITAMIN C) tablet Take 1 tablet by mouth as needed (for vitamin deficiency).    . clopidogrel (PLAVIX) 75 MG tablet Take 1 tablet (75 mg total) by mouth daily. 90 tablet 3  . GLUCOSAMINE PO Take 1 capsule by mouth as needed.     Marland Kitchen HYDROcodone-acetaminophen (NORCO) 10-325 MG per tablet Take 1 tablet by mouth every 8 (eight) hours as needed for pain. 30 tablet 1  . insulin aspart (NOVOLOG) 100 UNIT/ML injection Inject 20 Units into the skin 3 (three) times daily with meals.    . insulin glargine (LANTUS) 100 UNIT/ML injection Inject 10 Units into the skin at bedtime.    . Insulin Syringes, Disposable, U-100 0.3 ML MISC 60 Units/day by Does not apply route 4 (four) times daily. 100 each 5  . metFORMIN (GLUCOPHAGE) 1000 MG tablet Take 1 tablet (1,000 mg total) by mouth 2 (two) times daily with a meal. 180 tablet 3  . metoprolol tartrate  (LOPRESSOR) 25 MG tablet Take 0.5 tablets (12.5 mg total) by mouth 2 (two) times daily. 180 tablet 3  . nitroGLYCERIN (NITROSTAT) 0.4 MG SL tablet Place 1 tablet (0.4 mg total) under the tongue every 5 (five) minutes x 3 doses as needed for chest pain. 25 tablet 3  . sertraline (ZOLOFT) 50 MG tablet Take 1 tablet (50 mg total) by mouth daily. 30 tablet 3   No current facility-administered medications on file prior to visit.     Review of Systems  Constitutional: Negative for fever, chills, diaphoresis and unexpected weight change.  HENT: Negative for congestion, hearing loss, rhinorrhea, sore throat and trouble swallowing.   Respiratory: Negative for chest tightness.   Gastrointestinal: Negative for nausea, vomiting, abdominal pain, diarrhea, constipation and abdominal distention.  Endocrine: Negative for cold intolerance and heat intolerance.  Genitourinary: Negative for dysuria, hematuria, flank pain, penile swelling, penile pain and testicular pain.       Onset of E.D. 2 years ago. Unable to penetrate.  Musculoskeletal: Negative for joint swelling and arthralgias.  Skin: Negative for rash.  Neurological: Negative for dizziness and headaches.  Psychiatric/Behavioral: Negative for dysphoric mood, decreased concentration and agitation. The patient is not nervous/anxious.        Patient Active Problem List   Diagnosis Date Noted  . Dyspnea 02/05/2013  .  ACS (acute coronary syndrome) 01/22/2013  . Precordial pain 01/22/2013  . Hypokalemia 01/14/2013  . CAD (coronary artery disease), native coronary artery 01/14/2013  . Cardiomyopathy, ischemic 01/14/2013  . ST elevation myocardial infarction (STEMI) of inferior wall 01/09/2013  . Poorly controlled type 2 diabetes mellitus 01/09/2013  . HTN (hypertension) 01/09/2013   Outpatient Encounter Prescriptions as of 11/02/2014  Medication Sig  . allopurinol (ZYLOPRIM) 300 MG tablet Take 1 tablet (300 mg total) by mouth daily as needed (gout  pain).  Marland Kitchen ALPRAZolam (XANAX) 0.25 MG tablet Take 1 tablet (0.25 mg total) by mouth 3 (three) times daily as needed for anxiety.  Marland Kitchen aspirin EC 81 MG EC tablet Take 1 tablet (81 mg total) by mouth daily.  Marland Kitchen atorvastatin (LIPITOR) 80 MG tablet Take 1 tablet (80 mg total) by mouth daily at 6 PM. For cholesterol  . B Complex-C (B-COMPLEX WITH VITAMIN C) tablet Take 1 tablet by mouth as needed (for vitamin deficiency).  . clopidogrel (PLAVIX) 75 MG tablet Take 1 tablet (75 mg total) by mouth daily.  Marland Kitchen glipiZIDE (GLUCOTROL) 5 MG tablet Take 1 tablet (5 mg total) by mouth daily before breakfast.  . GLUCOSAMINE PO Take 1 capsule by mouth as needed.   Marland Kitchen glucose blood test strip Check Blood sugar TID  . HYDROcodone-acetaminophen (NORCO) 10-325 MG per tablet Take 1 tablet by mouth every 8 (eight) hours as needed for pain.  Marland Kitchen insulin aspart (NOVOLOG) 100 UNIT/ML injection Inject 20 Units into the skin 3 (three) times daily with meals.  . insulin glargine (LANTUS) 100 UNIT/ML injection Inject 10 Units into the skin at bedtime.  . Insulin Syringes, Disposable, U-100 0.3 ML MISC 60 Units/day by Does not apply route 4 (four) times daily.  . Lancets (ACCU-CHEK MULTICLIX) lancets Use as instructed. Test TID  . metFORMIN (GLUCOPHAGE) 1000 MG tablet Take 1 tablet (1,000 mg total) by mouth 2 (two) times daily with a meal.  . metoprolol tartrate (LOPRESSOR) 25 MG tablet Take 0.5 tablets (12.5 mg total) by mouth 2 (two) times daily.  . nitroGLYCERIN (NITROSTAT) 0.4 MG SL tablet Place 1 tablet (0.4 mg total) under the tongue every 5 (five) minutes x 3 doses as needed for chest pain.  Marland Kitchen sertraline (ZOLOFT) 50 MG tablet Take 1 tablet (50 mg total) by mouth daily.   Objective:   Physical Exam  Constitutional: He is oriented to person, place, and time. He appears well-developed and well-nourished. No distress.  HENT:  Head: Normocephalic and atraumatic.  Right Ear: External ear normal.  Left Ear: External ear normal.    Nose: Nose normal.  Mouth/Throat: Oropharynx is clear and moist.  Eyes: Conjunctivae and EOM are normal. Pupils are equal, round, and reactive to light.  Neck: Normal range of motion. Neck supple. No thyromegaly present.  Cardiovascular: Normal rate, regular rhythm and normal heart sounds.   No murmur heard. Pulmonary/Chest: Effort normal and breath sounds normal. No respiratory distress. He has no wheezes. He has no rales.  Abdominal: Soft. Bowel sounds are normal. He exhibits no distension. There is no tenderness.  Lymphadenopathy:    He has no cervical adenopathy.  Neurological: He is alert and oriented to person, place, and time. He has normal reflexes.  Skin: Skin is warm and dry.  Psychiatric: He has a normal mood and affect. His behavior is normal. Judgment and thought content normal.   BP 126/79 mmHg  Pulse 68  Temp(Src) 97.3 F (36.3 C) (Oral)  Ht 5\' 6"  (1.676 m)  Wt 186 lb 6.4 oz (84.55 kg)  BMI 30.10 kg/m2        Assessment & Plan:  1. Diabetes mellitus type 2, uncontrolled, without complications - POCT UA - Microalbumin  2. Poorly controlled type 2 diabetes mellitus Check glucose AC & hs x 6weeks  3. Diabetic hypoglycemia DC glipizide  4. Diabetic peripheral neuropathy Added gabapentin  5. Other male erectile dysfunction Likely due to DM & med for CAD. Added viagra with caution regarding use of Nitroglycerin within 24 hours  6. Coronary artery disease involving native coronary artery of native heart with angina pectoris Currently stable, multiple risk factors  7. Old MI (myocardial infarction)

## 2014-11-02 NOTE — Telephone Encounter (Signed)
Corrected RX sent into [facility]

## 2014-11-02 NOTE — Telephone Encounter (Signed)
Corrected RX sent into [facility]

## 2014-12-15 ENCOUNTER — Ambulatory Visit (INDEPENDENT_AMBULATORY_CARE_PROVIDER_SITE_OTHER): Payer: 59

## 2014-12-15 ENCOUNTER — Telehealth: Payer: Self-pay | Admitting: *Deleted

## 2014-12-15 ENCOUNTER — Encounter: Payer: Self-pay | Admitting: Family Medicine

## 2014-12-15 ENCOUNTER — Ambulatory Visit (INDEPENDENT_AMBULATORY_CARE_PROVIDER_SITE_OTHER): Payer: 59 | Admitting: Family Medicine

## 2014-12-15 VITALS — BP 124/83 | HR 70 | Temp 97.1°F | Ht 66.0 in | Wt 192.6 lb

## 2014-12-15 DIAGNOSIS — E11649 Type 2 diabetes mellitus with hypoglycemia without coma: Secondary | ICD-10-CM | POA: Diagnosis not present

## 2014-12-15 DIAGNOSIS — M25511 Pain in right shoulder: Secondary | ICD-10-CM

## 2014-12-15 DIAGNOSIS — M545 Low back pain: Secondary | ICD-10-CM | POA: Diagnosis not present

## 2014-12-15 DIAGNOSIS — E1140 Type 2 diabetes mellitus with diabetic neuropathy, unspecified: Secondary | ICD-10-CM

## 2014-12-15 MED ORDER — INSULIN GLARGINE 100 UNIT/ML ~~LOC~~ SOLN: 30.0000 [IU] | Freq: Every day | SUBCUTANEOUS | Status: DC

## 2014-12-15 MED ORDER — INSULIN ASPART 100 UNIT/ML ~~LOC~~ SOLN: SUBCUTANEOUS | Status: DC

## 2014-12-15 MED ORDER — INSULIN LISPRO 100 UNIT/ML ~~LOC~~ SOLN: SUBCUTANEOUS | Status: DC

## 2014-12-15 NOTE — Addendum Note (Signed)
Addended by: Bearl Mulberry on: 12/15/2014 07:02 PM   Modules accepted: Orders

## 2014-12-15 NOTE — Addendum Note (Signed)
Addended by: Bearl Mulberry on: 12/15/2014 11:04 AM   Modules accepted: Orders

## 2014-12-15 NOTE — Progress Notes (Signed)
Subjective:  Patient ID: Ethan Peters, male    DOB: 1956/08/02  Age: 59 y.o. MRN: [id number]  CC: Diabetes   HPI Ethan Peters presents for Patient checking blood sugar twice daily the last months readings are attached showing fasting to be in the 150-200 range and bedtime to be in the 100-150 range with occasional drops below 100. There are 2 occasions where it dropped into the 60s and 1 when it dropped into the low 50s patient decides any hypoglycemic symptoms at the time. However these were bedtime and he is complaining of waking up feeling bad for a long time. Every morning he just feels weak and tired. This is a general malaise without focal finding with the exception of the right shoulder. Patient denies symptoms such as polyuria, polydipsia, excessive hunger, nausea No significant hypoglycemic spells noted. Medications as noted below. He has increased his Lantus to 20 units at bedtime remaining medicines are taken as noted Taking them regularly without complication/adverse reaction being reported today. He does take gabapentin for a peripheral neuropathy related to his diabetes. It does relieve his pain.  Patient reports pain in the right shoulder chronically. Worsened by shoulder abduction and/or external rotation patient points to the proximal superior biceps tendon region. He points and says that it's very deep as if at the bone. History Ethan Peters has a past medical history of Hypertension; Diabetes mellitus; CAD (coronary artery disease) (01/09/13); Ischemic cardiomyopathy; Myocardial infarction (01/09/2013); Shortness of breath; and Gout.   He has past surgical history that includes Coronary angioplasty with stent (01/09/2013); Cardiac catheterization (01/22/2013); left heart catheterization with coronary angiogram (N/A, 01/09/2013); and left heart catheterization with coronary angiogram (N/A, 01/22/2013).   His family history includes Heart disease in his father and mother.He  reports that he has never smoked. He has never used smokeless tobacco. He reports that he drinks alcohol. He reports that he does not use illicit drugs.  Current Outpatient Prescriptions on File Prior to Visit  Medication Sig Dispense Refill  . allopurinol (ZYLOPRIM) 300 MG tablet Take 1 tablet (300 mg total) by mouth daily as needed (gout pain). 90 tablet 3  . ALPRAZolam (XANAX) 0.25 MG tablet Take 1 tablet (0.25 mg total) by mouth 3 (three) times daily as needed for anxiety. 30 tablet 3  . aspirin EC 81 MG EC tablet Take 1 tablet (81 mg total) by mouth daily.    Marland Kitchen atorvastatin (LIPITOR) 80 MG tablet Take 1 tablet (80 mg total) by mouth daily at 6 PM. For cholesterol 90 tablet 3  . B Complex-C (B-COMPLEX WITH VITAMIN C) tablet Take 1 tablet by mouth as needed (for vitamin deficiency).    . clopidogrel (PLAVIX) 75 MG tablet Take 1 tablet (75 mg total) by mouth daily. 90 tablet 3  . gabapentin (NEURONTIN) 300 MG capsule Take 1 capsule (300 mg total) by mouth at bedtime. 30 capsule 2  . GLUCOSAMINE PO Take 1 capsule by mouth as needed.     Marland Kitchen glucose blood test strip Check Blood sugar QID Verio 120 each 12  . HYDROcodone-acetaminophen (NORCO) 10-325 MG per tablet Take 1 tablet by mouth every 8 (eight) hours as needed for pain. 30 tablet 1  . insulin aspart (NOVOLOG) 100 UNIT/ML injection Inject 20 Units into the skin 3 (three) times daily with meals.    . insulin glargine (LANTUS) 100 UNIT/ML injection Inject 10 Units into the skin at bedtime.    . Insulin Syringes, Disposable, U-100 0.3 ML MISC 60 Units/day  by Does not apply route 4 (four) times daily. 100 each 5  . Lancets (ACCU-CHEK MULTICLIX) lancets Use as instructed. Test QID, Delica 120 each 12  . metFORMIN (GLUCOPHAGE) 1000 MG tablet Take 1 tablet (1,000 mg total) by mouth 2 (two) times daily with a meal. 180 tablet 3  . metoprolol tartrate (LOPRESSOR) 25 MG tablet Take 0.5 tablets (12.5 mg total) by mouth 2 (two) times daily. 180 tablet 3    . nitroGLYCERIN (NITROSTAT) 0.4 MG SL tablet Place 1 tablet (0.4 mg total) under the tongue every 5 (five) minutes x 3 doses as needed for chest pain. 25 tablet 3  . sertraline (ZOLOFT) 50 MG tablet Take 1 tablet (50 mg total) by mouth daily. 30 tablet 3  . sildenafil (VIAGRA) 50 MG tablet Take 1 tablet (50 mg total) by mouth daily as needed for erectile dysfunction. 10 tablet 5   No current facility-administered medications on file prior to visit.    ROS Review of Systems  Constitutional: Positive for fatigue (malaise in the mornings forseveral hours). Negative for fever, chills, diaphoresis and unexpected weight change.  HENT: Negative for congestion, hearing loss, rhinorrhea, sore throat and trouble swallowing.   Respiratory: Negative for cough, chest tightness, shortness of breath and wheezing.   Gastrointestinal: Negative for nausea, vomiting, abdominal pain, diarrhea, constipation and abdominal distention.  Endocrine: Negative for cold intolerance and heat intolerance.  Genitourinary: Negative for dysuria, hematuria and flank pain.  Musculoskeletal: Positive for myalgias (right shoulder see history of present illness). Negative for joint swelling and arthralgias.  Skin: Negative for rash.  Neurological: Negative for dizziness and headaches.  Psychiatric/Behavioral: Negative for dysphoric mood, decreased concentration and agitation. The patient is not nervous/anxious.     Objective:  BP 124/83 mmHg  Pulse 70  Temp(Src) 97.1 F (36.2 C) (Oral)  Ht  (1.676 m)  Wt 192 lb 9.6 oz (87.363 kg)  BMI 31.10 kg/m2  BP Readings from Last 3 Encounters:  12/15/14 124/83  11/02/14 126/79  10/18/14 148/90    Wt Readings from Last 3 Encounters:  12/15/14 192 lb 9.6 oz (87.363 kg)  11/02/14 186 lb 6.4 oz (84.55 kg)  10/18/14 184 lb (83.462 kg)     Physical Exam  Constitutional: He is oriented to person, place, and time. He appears well-developed and well-nourished. No distress.   HENT:  Head: Normocephalic and atraumatic.  Nose: Nose normal.  Mouth/Throat: Oropharynx is clear and moist.  Eyes: Conjunctivae and EOM are normal. Pupils are equal, round, and reactive to light.  Neck: Normal range of motion. Neck supple. No thyromegaly present.  Cardiovascular: Normal rate, regular rhythm and normal heart sounds.   No murmur heard. Pulmonary/Chest: Effort normal and breath sounds normal. No respiratory distress. He has no wheezes. He has no rales.  Abdominal: Soft. Bowel sounds are normal. Tenderness: at the anterior proximal aspect at the glenohumeral joint at the border of the deltoid. Tenderness is moderate. Some diminished strength 4 abduction above 90  Musculoskeletal: Normal range of motion. He exhibits tenderness.  Lymphadenopathy:    He has no cervical adenopathy.  Neurological: He is alert and oriented to person, place, and time. He has normal reflexes.  Skin: Skin is warm and dry.  Psychiatric: He has a normal mood and affect. His behavior is normal. Judgment and thought content normal.    Lab Results  Component Value Date   HGBA1C 7.3% 09/22/2014   HGBA1C 6.3 06/01/2013   HGBA1C 14.8* 01/10/2013    Lab Results  Component Value Date   WBC 6.8 09/22/2014   HGB 14.3 09/22/2014   HCT 44.8 09/22/2014   PLT 313 01/21/2013   GLUCOSE 219* 09/22/2014   CHOL 195 09/22/2014   TRIG 215* 09/22/2014   HDL 51 09/22/2014   LDLCALC 101* 09/22/2014   ALT 16 09/22/2014   AST 19 09/22/2014   NA 139 09/22/2014   K 5.4* 09/22/2014   CL 99 09/22/2014   CREATININE 1.16 09/22/2014   BUN 15 09/22/2014   CO2 24 09/22/2014   TSH 2.880 09/22/2014   PSA 0.6 09/22/2014   INR 1.16 01/21/2013   HGBA1C 7.3% 09/22/2014    No results found.  Assessment & Plan:   Mekel was seen today for diabetes.  Diagnoses and all orders for this visit:  Right shoulder pain Orders: -     DG Shoulder Right  Diabetic hypoglycemia  Diabetic neuropathy, type II diabetes  mellitus  I am having Mr. Colavito maintain his GLUCOSAMINE PO, aspirin, nitroGLYCERIN, ALPRAZolam, B-complex with vitamin C, Insulin Syringes (Disposable), HYDROcodone-acetaminophen, insulin glargine, insulin aspart, metoprolol tartrate, clopidogrel, atorvastatin, sertraline, allopurinol, metFORMIN, gabapentin, sildenafil, glucose blood, and accu-chek multiclix.  No orders of the defined types were placed in this encounter.    Comments: We are a week too early to do his A1c. Therefore he'll drop by for that blood work in approximately one week.  I am concerned that he may be having Somogyi effect since his sugars are quite good in the mornings as a rule. At this time I believe changing his Lantus to morning may help prevent that as well as decreasing his NovoLog at suppertime.  Follow-up: Return in about 3 months (around 03/17/2015).  Mechele Claude, M.D.

## 2014-12-15 NOTE — Telephone Encounter (Signed)
Dr. Darlyn Read, [facility] ins co prefers humalog to novolog is it possible to switch, it will be cheaper for pt.  If this is not suitable let me know and I will follow up with PA for novolog.  Thanks for your help.

## 2014-12-15 NOTE — Patient Instructions (Signed)
Do not take your Lantus this evening . Instead take 30 units tomorrow morning and every morning thereafter. Also take 10 units of NovoLog with supper instead of 20 starting tomorrow evening. Continue the breakfast and lunch NovoLog as is.  Drop by for a hemoglobin A1c in about 1 week. Will also do some basic blood work due to the concern of feeling bad in the mornings when you return for that.

## 2014-12-15 NOTE — Telephone Encounter (Signed)
Please substitute Humalog. Sig remains the same

## 2014-12-22 ENCOUNTER — Other Ambulatory Visit (INDEPENDENT_AMBULATORY_CARE_PROVIDER_SITE_OTHER): Payer: 59

## 2014-12-22 DIAGNOSIS — R739 Hyperglycemia, unspecified: Secondary | ICD-10-CM

## 2014-12-22 DIAGNOSIS — E876 Hypokalemia: Secondary | ICD-10-CM

## 2014-12-22 LAB — POCT GLYCOSYLATED HEMOGLOBIN (HGB A1C)

## 2014-12-22 NOTE — Progress Notes (Signed)
Lab only 

## 2015-01-26 ENCOUNTER — Other Ambulatory Visit: Payer: Self-pay | Admitting: Family Medicine

## 2015-02-15 ENCOUNTER — Other Ambulatory Visit: Payer: Self-pay

## 2015-02-22 ENCOUNTER — Encounter: Payer: Self-pay | Admitting: Family Medicine

## 2015-02-22 ENCOUNTER — Encounter (INDEPENDENT_AMBULATORY_CARE_PROVIDER_SITE_OTHER): Payer: Self-pay

## 2015-02-22 ENCOUNTER — Ambulatory Visit (INDEPENDENT_AMBULATORY_CARE_PROVIDER_SITE_OTHER): Payer: 59 | Admitting: Family Medicine

## 2015-02-22 ENCOUNTER — Telehealth: Payer: Self-pay

## 2015-02-22 ENCOUNTER — Other Ambulatory Visit: Payer: Self-pay | Admitting: Family Medicine

## 2015-02-22 VITALS — BP 136/78 | HR 78 | Temp 98.8°F | Ht 66.0 in | Wt 188.4 lb

## 2015-02-22 DIAGNOSIS — E1140 Type 2 diabetes mellitus with diabetic neuropathy, unspecified: Secondary | ICD-10-CM | POA: Insufficient documentation

## 2015-02-22 DIAGNOSIS — Z794 Long term (current) use of insulin: Secondary | ICD-10-CM | POA: Insufficient documentation

## 2015-02-22 DIAGNOSIS — M12511 Traumatic arthropathy, right shoulder: Secondary | ICD-10-CM

## 2015-02-22 DIAGNOSIS — J4521 Mild intermittent asthma with (acute) exacerbation: Secondary | ICD-10-CM | POA: Diagnosis not present

## 2015-02-22 DIAGNOSIS — M12819 Other specific arthropathies, not elsewhere classified, unspecified shoulder: Secondary | ICD-10-CM | POA: Insufficient documentation

## 2015-02-22 DIAGNOSIS — M12811 Other specific arthropathies, not elsewhere classified, right shoulder: Secondary | ICD-10-CM

## 2015-02-22 MED ORDER — HYDROCODONE-HOMATROPINE 5-1.5 MG/5ML PO SYRP: 5.0000 mL | ORAL_SOLUTION | Freq: Four times a day (QID) | ORAL | Status: DC | PRN

## 2015-02-22 MED ORDER — BETAMETHASONE SOD PHOS & ACET 6 (3-3) MG/ML IJ SUSP
6.0000 mg | Freq: Once | INTRAMUSCULAR | Status: AC
  Administered 2015-02-22: 6 mg via INTRAMUSCULAR

## 2015-02-22 MED ORDER — LEVOFLOXACIN 500 MG PO TABS: 500.0000 mg | ORAL_TABLET | Freq: Every day | ORAL | Status: DC

## 2015-02-22 NOTE — Telephone Encounter (Signed)
Insurance prior authorized one touch verio test strips until 02/22/16

## 2015-02-22 NOTE — Progress Notes (Signed)
Subjective:  Patient ID: Ethan Peters, male    DOB: 10-15-1956  Age: 59 y.o. MRN: [id number]  CC: URI and Shoulder Pain   HPI Ethan Peters presents for  follow-up of hypertension. History of STEMI.  Profuse cough for a month, worsening. Caused chest pain last night. No radiation. Occurred after cough paroxysm.  Shoulder pain no better. Painful to tuck R arm behind him.  Glucose 120 - 160s fasting    History Ethan Peters has a past medical history of Hypertension; Diabetes mellitus; CAD (coronary artery disease) (01/09/13); Ischemic cardiomyopathy; Myocardial infarction (01/09/2013); Shortness of breath; and Gout.   He has past surgical history that includes Coronary angioplasty with stent (01/09/2013); Cardiac catheterization (01/22/2013); left heart catheterization with coronary angiogram (N/A, 01/09/2013); and left heart catheterization with coronary angiogram (N/A, 01/22/2013).   His family history includes Heart disease in his father and mother.He reports that he has never smoked. He has never used smokeless tobacco. He reports that he drinks alcohol. He reports that he does not use illicit drugs.  Current Outpatient Prescriptions on File Prior to Visit  Medication Sig Dispense Refill  . allopurinol (ZYLOPRIM) 300 MG tablet Take 1 tablet (300 mg total) by mouth daily as needed (gout pain). 90 tablet 3  . ALPRAZolam (XANAX) 0.25 MG tablet Take 1 tablet (0.25 mg total) by mouth 3 (three) times daily as needed for anxiety. 30 tablet 3  . aspirin EC 81 MG EC tablet Take 1 tablet (81 mg total) by mouth daily.    Marland Kitchen atorvastatin (LIPITOR) 80 MG tablet Take 1 tablet (80 mg total) by mouth daily at 6 PM. For cholesterol 90 tablet 3  . B Complex-C (B-COMPLEX WITH VITAMIN C) tablet Take 1 tablet by mouth as needed (for vitamin deficiency).    . clopidogrel (PLAVIX) 75 MG tablet Take 1 tablet (75 mg total) by mouth daily. 90 tablet 3  . gabapentin (NEURONTIN) 300 MG capsule Take 1 capsule  (300 mg total) by mouth at bedtime. 30 capsule 2  . GLUCOSAMINE PO Take 1 capsule by mouth as needed.     Marland Kitchen glucose blood test strip Check Blood sugar QID Verio 120 each 12  . HYDROcodone-acetaminophen (NORCO) 10-325 MG per tablet Take 1 tablet by mouth every 8 (eight) hours as needed for pain. 30 tablet 1  . insulin glargine (LANTUS) 100 UNIT/ML injection Inject 0.3 mLs (30 Units total) into the skin daily with breakfast. 10 mL 10  . insulin lispro (HUMALOG) 100 UNIT/ML injection Use 20 units before breakfast and lunch and 10 units before supper 10 mL 11  . Insulin Syringes, Disposable, U-100 0.3 ML MISC 60 Units/day by Does not apply route 4 (four) times daily. 100 each 5  . Lancets (ACCU-CHEK MULTICLIX) lancets Use as instructed. Test QID, Delica 120 each 12  . metFORMIN (GLUCOPHAGE) 1000 MG tablet Take 1 tablet (1,000 mg total) by mouth 2 (two) times daily with a meal. 180 tablet 3  . metoprolol tartrate (LOPRESSOR) 25 MG tablet Take 0.5 tablets (12.5 mg total) by mouth 2 (two) times daily. 180 tablet 3  . nitroGLYCERIN (NITROSTAT) 0.4 MG SL tablet Place 1 tablet (0.4 mg total) under the tongue every 5 (five) minutes x 3 doses as needed for chest pain. 25 tablet 3  . sertraline (ZOLOFT) 50 MG tablet TAKE ONE TABLET BY MOUTH ONCE DAILY 30 tablet 1  . sildenafil (VIAGRA) 50 MG tablet Take 1 tablet (50 mg total) by mouth daily as needed for erectile  dysfunction. 10 tablet 5   No current facility-administered medications on file prior to visit.    ROS Review of Systems  Constitutional: Negative for fever, chills and diaphoresis.  HENT: Negative for congestion, rhinorrhea and sore throat.   Respiratory: Negative for cough, shortness of breath and wheezing.   Cardiovascular: Negative for chest pain.  Gastrointestinal: Negative for nausea, vomiting, abdominal pain, diarrhea, constipation and abdominal distention.  Genitourinary: Negative for dysuria and frequency.  Musculoskeletal: Negative for  joint swelling and arthralgias.  Skin: Negative for rash.  Neurological: Negative for headaches.    Objective:  BP 136/78 mmHg  Pulse 78  Temp(Src) 98.8 F (37.1 C) (Oral)  Ht  (1.676 m)  Wt 188 lb 6.4 oz (85.458 kg)  BMI 30.42 kg/m2  BP Readings from Last 3 Encounters:  02/22/15 136/78  12/15/14 124/83  11/02/14 126/79    Wt Readings from Last 3 Encounters:  02/22/15 188 lb 6.4 oz (85.458 kg)  12/15/14 192 lb 9.6 oz (87.363 kg)  11/02/14 186 lb 6.4 oz (84.55 kg)     Physical Exam  Constitutional: He is oriented to person, place, and time. He appears well-developed and well-nourished. No distress.  HENT:  Head: Normocephalic and atraumatic.  Right Ear: External ear normal.  Left Ear: External ear normal.  Nose: Nose normal.  Mouth/Throat: Oropharynx is clear and moist.  Eyes: Conjunctivae and EOM are normal. Pupils are equal, round, and reactive to light.  Neck: Normal range of motion. Neck supple. No thyromegaly present.  Cardiovascular: Normal rate, regular rhythm and normal heart sounds.   No murmur heard. Pulmonary/Chest: Effort normal and breath sounds normal. No respiratory distress. He has no wheezes. He has no rales.  Abdominal: Soft. Bowel sounds are normal. He exhibits no distension. There is no tenderness.  Musculoskeletal: He exhibits tenderness (moderate tenderness over the lateral deltoid region of the right shoulder. Marked pain with resisted external rotation. Painful for external rotation to return arm behind the back.).  Lymphadenopathy:    He has no cervical adenopathy.  Neurological: He is alert and oriented to person, place, and time. He has normal reflexes.  Skin: Skin is warm and dry.  Psychiatric: He has a normal mood and affect. His behavior is normal. Judgment and thought content normal.    Lab Results  Component Value Date   HGBA1C 6.8% 12/22/2014   HGBA1C 7.3% 09/22/2014   HGBA1C 6.3 06/01/2013    Lab Results  Component Value  Date   WBC 6.8 09/22/2014   HGB 14.3 09/22/2014   HCT 44.8 09/22/2014   PLT 313 01/21/2013   GLUCOSE 219* 09/22/2014   CHOL 195 09/22/2014   TRIG 215* 09/22/2014   HDL 51 09/22/2014   LDLCALC 101* 09/22/2014   ALT 16 09/22/2014   AST 19 09/22/2014   NA 139 09/22/2014   K 5.4* 09/22/2014   CL 99 09/22/2014   CREATININE 1.16 09/22/2014   BUN 15 09/22/2014   CO2 24 09/22/2014   TSH 2.880 09/22/2014   PSA 0.6 09/22/2014   INR 1.16 01/21/2013   HGBA1C 6.8% 12/22/2014    No results found.  Assessment & Plan:   Keigen was seen today for uri and shoulder pain.  Diagnoses and all orders for this visit:  Long term current use of insulin  Diabetic polyneuropathy associated with other specified diabetes mellitus   I am having Mr. Rathgeber maintain his GLUCOSAMINE PO, aspirin, nitroGLYCERIN, ALPRAZolam, B-complex with vitamin C, Insulin Syringes (Disposable), HYDROcodone-acetaminophen, metoprolol tartrate, clopidogrel,  atorvastatin, allopurinol, metFORMIN, gabapentin, sildenafil, glucose blood, accu-chek multiclix, insulin glargine, insulin lispro, and sertraline.  No orders of the defined types were placed in this encounter.     Follow-up: No Follow-up on file.  Mechele Claude, M.D.

## 2015-02-23 ENCOUNTER — Telehealth: Payer: Self-pay | Admitting: Family Medicine

## 2015-02-23 DIAGNOSIS — I2510 Atherosclerotic heart disease of native coronary artery without angina pectoris: Secondary | ICD-10-CM

## 2015-02-23 NOTE — Telephone Encounter (Signed)
Yes, I am. Please refer. Thanks, WS.

## 2015-02-23 NOTE — Telephone Encounter (Signed)
Patient is requesting a referral to cardiologist are you ok with referral

## 2015-02-23 NOTE — Telephone Encounter (Signed)
Patient aware and referral placed.

## 2015-03-02 ENCOUNTER — Encounter: Payer: Self-pay | Admitting: Family

## 2015-03-02 ENCOUNTER — Other Ambulatory Visit: Payer: Self-pay | Admitting: Family Medicine

## 2015-03-02 ENCOUNTER — Ambulatory Visit (INDEPENDENT_AMBULATORY_CARE_PROVIDER_SITE_OTHER): Payer: 59 | Admitting: Family

## 2015-03-02 VITALS — BP 130/75 | HR 75 | Temp 97.4°F | Ht 66.0 in | Wt 186.6 lb

## 2015-03-02 DIAGNOSIS — J309 Allergic rhinitis, unspecified: Secondary | ICD-10-CM | POA: Diagnosis not present

## 2015-03-02 DIAGNOSIS — J069 Acute upper respiratory infection, unspecified: Secondary | ICD-10-CM

## 2015-03-02 MED ORDER — HYDROCODONE-HOMATROPINE 5-1.5 MG/5ML PO SYRP: 5.0000 mL | ORAL_SOLUTION | Freq: Four times a day (QID) | ORAL | Status: DC | PRN

## 2015-03-02 MED ORDER — METHYLPREDNISOLONE ACETATE 80 MG/ML IJ SUSP
80.0000 mg | Freq: Once | INTRAMUSCULAR | Status: AC
  Administered 2015-03-02: 80 mg via INTRAMUSCULAR

## 2015-03-02 MED ORDER — MONTELUKAST SODIUM 10 MG PO TABS: 10.0000 mg | ORAL_TABLET | Freq: Every day | ORAL | Status: DC

## 2015-03-02 MED ORDER — BENZONATATE 200 MG PO CAPS: 200.0000 mg | ORAL_CAPSULE | Freq: Three times a day (TID) | ORAL | Status: DC | PRN

## 2015-03-02 MED ORDER — HYDROCHLOROTHIAZIDE 25 MG PO TABS: 25.0000 mg | ORAL_TABLET | Freq: Every day | ORAL | Status: DC

## 2015-03-02 MED ORDER — FLUTICASONE PROPIONATE 50 MCG/ACT NA SUSP: 2.0000 | Freq: Every day | NASAL | Status: DC

## 2015-03-02 NOTE — Progress Notes (Signed)
Subjective:    Patient ID: Ethan Peters, male    DOB: 1956/06/15, 59 y.o.   MRN: [id number]  URI  This is a recurrent problem. The current episode started 1 to 4 weeks ago. The problem has been gradually worsening. There has been no fever. Associated symptoms include congestion, coughing, headaches, nausea, rhinorrhea and sneezing. Pertinent negatives include no ear pain, plugged ear sensation, sinus pain or sore throat. Treatments tried: pt finished levaquin yesterday. The treatment provided mild relief.      Review of Systems  Constitutional: Negative.   HENT: Positive for congestion, rhinorrhea and sneezing. Negative for ear pain and sore throat.   Respiratory: Positive for cough.   Cardiovascular: Negative.   Gastrointestinal: Positive for nausea.  Endocrine: Negative.   Genitourinary: Negative.   Musculoskeletal: Negative.   Neurological: Positive for headaches.  Hematological: Negative.   Psychiatric/Behavioral: Negative.   All other systems reviewed and are negative.      Objective:   Physical Exam  Constitutional: He is oriented to person, place, and time. He appears well-developed and well-nourished. No distress.  HENT:  Head: Normocephalic.  Right Ear: External ear normal.  Left Ear: External ear normal.  Nasal passage erythemas with mild swelling  Oropharynx erythemas   Eyes: Pupils are equal, round, and reactive to light. Right eye exhibits no discharge. Left eye exhibits no discharge.  Neck: Normal range of motion. Neck supple. No thyromegaly present.  Cardiovascular: Normal rate, regular rhythm, normal heart sounds and intact distal pulses.   No murmur heard. Pulmonary/Chest: Effort normal and breath sounds normal. No respiratory distress. He has no wheezes.  Abdominal: Soft. Bowel sounds are normal. He exhibits no distension. There is no tenderness.  Musculoskeletal: Normal range of motion. He exhibits no edema or tenderness.  Neurological: He is  alert and oriented to person, place, and time. He has normal reflexes. No cranial nerve deficit.  Skin: Skin is warm and dry. No rash noted. No erythema.  Psychiatric: He has a normal mood and affect. His behavior is normal. Judgment and thought content normal.  Vitals reviewed.     BP 130/75 mmHg  Pulse 75  Temp(Src) 97.4 F (36.3 C) (Oral)  Ht 5\' 6"  (1.676 m)  Wt 186 lb 9.6 oz (84.641 kg)  BMI 30.13 kg/m2     Assessment & Plan:  1. Acute upper respiratory infection -- Take meds as prescribed - Use a cool mist humidifier  -Use saline nose sprays frequently -Saline irrigations of the nose can be very helpful if done frequently.  * 4X daily for 1 week*  * Use of a nettie pot can be helpful with this. Follow directions with this* -Force fluids -For any cough or congestion  Use plain Mucinex- regular strength or max strength is fine   * Children- consult with Pharmacist for dosing -For fever or aces or pains- take tylenol or ibuprofen appropriate for age and weight.  * for fevers greater than 101 orally you may alternate ibuprofen and tylenol every  3 hours. -Throat lozenges if helkp - hydrochlorothiazide (HYDRODIURIL) 25 MG tablet; Take 1 tablet (25 mg total) by mouth daily.  Dispense: 90 tablet; Refill: 3 - methylPREDNISolone acetate (DEPO-MEDROL) injection 80 mg; Inject 1 mL (80 mg total) into the muscle once. - benzonatate (TESSALON) 200 MG capsule; Take 1 capsule (200 mg total) by mouth 3 (three) times daily as needed.  Dispense: 30 capsule; Refill: 1 - montelukast (SINGULAIR) 10 MG tablet; Take 1 tablet (10 mg total)  by mouth at bedtime.  Dispense: 30 tablet; Refill: 3  2. Allergic rhinitis, unspecified allergic rhinitis type - montelukast (SINGULAIR) 10 MG tablet; Take 1 tablet (10 mg total) by mouth at bedtime.  Dispense: 30 tablet; Refill: 3 - fluticasone (FLONASE) 50 MCG/ACT nasal spray; Place 2 sprays into both nostrils daily.  Dispense: 16 g; Refill: 6   Jannifer Rodney, FNP

## 2015-03-02 NOTE — Addendum Note (Signed)
Addended by: Bearl Mulberry on: 03/02/2015 06:22 PM   Modules accepted: Kipp Brood

## 2015-03-02 NOTE — Patient Instructions (Signed)
Upper Respiratory Infection, Adult An upper respiratory infection (URI) is also sometimes known as the common cold. The upper respiratory tract includes the nose, sinuses, throat, trachea, and bronchi. Bronchi are the airways leading to the lungs. Most people improve within 1 week, but symptoms can last up to 2 weeks. A residual cough may last even longer.  CAUSES Many different viruses can infect the tissues lining the upper respiratory tract. The tissues become irritated and inflamed and often become very moist. Mucus production is also common. A cold is contagious. You can easily spread the virus to others by oral contact. This includes kissing, sharing a glass, coughing, or sneezing. Touching your mouth or nose and then touching a surface, which is then touched by another person, can also spread the virus. SYMPTOMS  Symptoms typically develop 1 to 3 days after you come in contact with a cold virus. Symptoms vary from person to person. They may include:  Runny nose.  Sneezing.  Nasal congestion.  Sinus irritation.  Sore throat.  Loss of voice (laryngitis).  Cough.  Fatigue.  Muscle aches.  Loss of appetite.  Headache.  Low-grade fever. DIAGNOSIS  You might diagnose your own cold based on familiar symptoms, since most people get a cold 2 to 3 times a year. Your caregiver can confirm this based on your exam. Most importantly, your caregiver can check that your symptoms are not due to another disease such as strep throat, sinusitis, pneumonia, asthma, or epiglottitis. Blood tests, throat tests, and X-rays are not necessary to diagnose a common cold, but they may sometimes be helpful in excluding other more serious diseases. Your caregiver will decide if any further tests are required. RISKS AND COMPLICATIONS  You may be at risk for a more severe case of the common cold if you smoke cigarettes, have chronic heart disease (such as heart failure) or lung disease (such as asthma), or if  you have a weakened immune system. The very young and very old are also at risk for more serious infections. Bacterial sinusitis, middle ear infections, and bacterial pneumonia can complicate the common cold. The common cold can worsen asthma and chronic obstructive pulmonary disease (COPD). Sometimes, these complications can require emergency medical care and may be life-threatening. PREVENTION  The best way to protect against getting a cold is to practice good hygiene. Avoid oral or hand contact with people with cold symptoms. Wash your hands often if contact occurs. There is no clear evidence that vitamin C, vitamin E, echinacea, or exercise reduces the chance of developing a cold. However, it is always recommended to get plenty of rest and practice good nutrition. TREATMENT  Treatment is directed at relieving symptoms. There is no cure. Antibiotics are not effective, because the infection is caused by a virus, not by bacteria. Treatment may include:  Increased fluid intake. Sports drinks offer valuable electrolytes, sugars, and fluids.  Breathing heated mist or steam (vaporizer or shower).  Eating chicken soup or other clear broths, and maintaining good nutrition.  Getting plenty of rest.  Using gargles or lozenges for comfort.  Controlling fevers with ibuprofen or acetaminophen as directed by your caregiver.  Increasing usage of your inhaler if you have asthma. Zinc gel and zinc lozenges, taken in the first 24 hours of the common cold, can shorten the duration and lessen the severity of symptoms. Pain medicines may help with fever, muscle aches, and throat pain. A variety of non-prescription medicines are available to treat congestion and runny nose. Your caregiver   can make recommendations and may suggest nasal or lung inhalers for other symptoms.  HOME CARE INSTRUCTIONS   Only take over-the-counter or prescription medicines for pain, discomfort, or fever as directed by your  caregiver.  Use a warm mist humidifier or inhale steam from a shower to increase air moisture. This may keep secretions moist and make it easier to breathe.  Drink enough water and fluids to keep your urine clear or pale yellow.  Rest as needed.  Return to work when your temperature has returned to normal or as your caregiver advises. You may need to stay home longer to avoid infecting others. You can also use a face mask and careful hand washing to prevent spread of the virus. SEEK MEDICAL CARE IF:   After the first few days, you feel you are getting worse rather than better.  You need your caregiver's advice about medicines to control symptoms.  You develop chills, worsening shortness of breath, or brown or red sputum. These may be signs of pneumonia.  You develop yellow or brown nasal discharge or pain in the face, especially when you bend forward. These may be signs of sinusitis.  You develop a fever, swollen neck glands, pain with swallowing, or white areas in the back of your throat. These may be signs of strep throat. SEEK IMMEDIATE MEDICAL CARE IF:   You have a fever.  You develop severe or persistent headache, ear pain, sinus pain, or chest pain.  You develop wheezing, a prolonged cough, cough up blood, or have a change in your usual mucus (if you have chronic lung disease).  You develop sore muscles or a stiff neck. Document Released: 03/27/2001 Document Revised: 12/24/2011 Document Reviewed: 01/06/2014 [employer] Patient Information 2015 [employer]. This information is not intended to replace advice given to you by your health care provider. Make sure you discuss any questions you have with your health care provider.  - Take meds as prescribed - Use a cool mist humidifier  -Use saline nose sprays frequently -Saline irrigations of the nose can be very helpful if done frequently.  * 4X daily for 1 week*  * Use of a nettie pot can be helpful with this. Follow  directions with this* -Force fluids -For any cough or congestion  Use plain Mucinex- regular strength or max strength is fine   * Children- consult with Pharmacist for dosing -For fever or aces or pains- take tylenol or ibuprofen appropriate for age and weight.  * for fevers greater than 101 orally you may alternate ibuprofen and tylenol every  3 hours. -Throat lozenges if help   Dessie Delcarlo, FNP   

## 2015-03-02 NOTE — Telephone Encounter (Signed)
Pt has continued cough and congestion appt scheduled

## 2015-03-02 NOTE — Addendum Note (Signed)
Addended by: Bearl Mulberry on: 03/02/2015 05:57 PM   Modules accepted: Kipp Brood

## 2015-03-03 ENCOUNTER — Telehealth: Payer: Self-pay | Admitting: Family Medicine

## 2015-03-03 NOTE — Telephone Encounter (Signed)
Pt notified Dr Darlyn Read will be back on Monday He has enough Lantus to last  Verbalizes understanding

## 2015-03-06 NOTE — Telephone Encounter (Signed)
I believe glargine his Lantus in generic form. Because of that making a switch is acceptable.

## 2015-03-07 ENCOUNTER — Other Ambulatory Visit: Payer: Self-pay

## 2015-03-07 DIAGNOSIS — M25511 Pain in right shoulder: Secondary | ICD-10-CM

## 2015-03-07 MED ORDER — INSULIN DETEMIR 100 UNIT/ML ~~LOC~~ SOLN: 30.0000 [IU] | Freq: Every day | SUBCUTANEOUS | Status: DC

## 2015-03-07 NOTE — Telephone Encounter (Signed)
Ins won't cover Lantus but will cover Levemir Ok to change to levemir per Dr Darlyn Read Order sent into [facility]

## 2015-03-07 NOTE — Addendum Note (Signed)
Addended by: Bearl Mulberry on: 03/07/2015 11:50 AM   Modules accepted: Orders

## 2015-03-28 ENCOUNTER — Telehealth: Payer: Self-pay | Admitting: *Deleted

## 2015-03-28 NOTE — Telephone Encounter (Signed)
Pt notified to continue Lantus copay card to front for pt pick up Pt verbalizes understanding

## 2015-03-28 NOTE — Telephone Encounter (Signed)
Have him switch back to Lantus, same directions

## 2015-03-28 NOTE — Telephone Encounter (Signed)
Pt can not take Levemir It causes feet and hands to swell Please change to something else

## 2015-03-29 ENCOUNTER — Other Ambulatory Visit: Payer: Self-pay | Admitting: Family Medicine

## 2015-03-30 ENCOUNTER — Other Ambulatory Visit: Payer: Self-pay | Admitting: Family Medicine

## 2015-03-30 MED ORDER — METFORMIN HCL 1000 MG PO TABS: ORAL_TABLET | ORAL | Status: DC

## 2015-03-30 NOTE — Telephone Encounter (Signed)
Pt notified of refill for Metformin sent into [facility] Also notified pt of copay card for Lantus Card to front for pt pick up

## 2015-03-31 ENCOUNTER — Encounter: Payer: Self-pay | Admitting: Family Medicine

## 2015-03-31 ENCOUNTER — Ambulatory Visit (INDEPENDENT_AMBULATORY_CARE_PROVIDER_SITE_OTHER): Payer: 59 | Admitting: Family Medicine

## 2015-03-31 VITALS — BP 134/86 | HR 71 | Temp 97.1°F | Ht 66.0 in | Wt 182.6 lb

## 2015-03-31 DIAGNOSIS — G629 Polyneuropathy, unspecified: Secondary | ICD-10-CM | POA: Diagnosis not present

## 2015-03-31 DIAGNOSIS — J309 Allergic rhinitis, unspecified: Secondary | ICD-10-CM | POA: Diagnosis not present

## 2015-03-31 DIAGNOSIS — E785 Hyperlipidemia, unspecified: Secondary | ICD-10-CM | POA: Diagnosis not present

## 2015-03-31 DIAGNOSIS — E1142 Type 2 diabetes mellitus with diabetic polyneuropathy: Secondary | ICD-10-CM

## 2015-03-31 DIAGNOSIS — E119 Type 2 diabetes mellitus without complications: Secondary | ICD-10-CM

## 2015-03-31 DIAGNOSIS — E1165 Type 2 diabetes mellitus with hyperglycemia: Secondary | ICD-10-CM

## 2015-03-31 DIAGNOSIS — E1342 Other specified diabetes mellitus with diabetic polyneuropathy: Secondary | ICD-10-CM

## 2015-03-31 DIAGNOSIS — I10 Essential (primary) hypertension: Secondary | ICD-10-CM | POA: Diagnosis not present

## 2015-03-31 LAB — POCT CBC
Granulocyte percent: 59.8 %G (ref 37–80)
HEMATOCRIT: 38.3 % — AB (ref 43.5–53.7)
Hemoglobin: 12.6 g/dL — AB (ref 14.1–18.1)
Lymph, poc: 2.3 (ref 0.6–3.4)
MCH: 30.6 pg (ref 27–31.2)
MCHC: 32.8 g/dL (ref 31.8–35.4)
MCV: 93.1 fL (ref 80–97)
MPV: 8.8 fL (ref 0–99.8)
POC Granulocyte: 4.4 (ref 2–6.9)
POC LYMPH PERCENT: 31.4 %L (ref 10–50)
Platelet Count, POC: 169 10*3/uL (ref 142–424)
RBC: 4.11 M/uL — AB (ref 4.69–6.13)
RDW, POC: 13.1 %
WBC: 7.3 10*3/uL (ref 4.6–10.2)

## 2015-03-31 LAB — POCT GLYCOSYLATED HEMOGLOBIN (HGB A1C): HEMOGLOBIN A1C: 7.4

## 2015-03-31 MED ORDER — FLUTICASONE PROPIONATE 50 MCG/ACT NA SUSP: 2.0000 | Freq: Every day | NASAL | Status: DC

## 2015-03-31 MED ORDER — LANSOPRAZOLE 30 MG PO CPDR: 30.0000 mg | DELAYED_RELEASE_CAPSULE | Freq: Every day | ORAL | Status: DC

## 2015-03-31 NOTE — Progress Notes (Signed)
Subjective:  Patient ID: Ethan Peters, male    DOB: [birth year]  Age: 59 y.o. MRN: [id number]  CC: Diabetes and Hypertension   HPI Ethan Peters presents for  follow-up of hypertension. Patient has no history of headache chest pain or shortness of breath or recent cough. Patient also denies symptoms of TIA such as numbness weakness lateralizing. Patient checks  blood pressure at home and has not had any elevated readings recently. Patient denies side effects from his medication. States taking it regularly.  Patient also  in for follow-up of elevated cholesterol. Doing well without complaints on current medication. Denies side effects of statin including myalgia and arthralgia and nausea. Also in today for liver function testing. Currently no chest pain, shortness of breath or other cardiovascular related symptoms noted.  Follow-up of diabetes. Patient does check blood sugar at home. Readings run between 50 and 250.  Log reviewed, attached Patient denies symptoms such as polyuria, polydipsia, excessive hunger, nausea No significant hypoglycemic spells noted. Medications as noted below. Taking them regularly without complication/adverse reaction being reported today.    History Ethan Peters has a past medical history of Hypertension; Diabetes mellitus; CAD (coronary artery disease) (01/09/13); Ischemic cardiomyopathy; Myocardial infarction (01/09/2013); Shortness of breath; and Gout.   He has past surgical history that includes Coronary angioplasty with stent (01/09/2013); Cardiac catheterization (01/22/2013); left heart catheterization with coronary angiogram (N/A, 01/09/2013); and left heart catheterization with coronary angiogram (N/A, 01/22/2013).   His family history includes Heart disease in his father and mother.He reports that he has never smoked. He has never used smokeless tobacco. He reports that he drinks alcohol. He reports that he does not use illicit drugs.  Current Outpatient  Prescriptions on File Prior to Visit  Medication Sig Dispense Refill  . allopurinol (ZYLOPRIM) 300 MG tablet Take 1 tablet (300 mg total) by mouth daily as needed (gout pain). 90 tablet 3  . aspirin EC 81 MG EC tablet Take 1 tablet (81 mg total) by mouth daily.    Marland Kitchen atorvastatin (LIPITOR) 80 MG tablet Take 1 tablet (80 mg total) by mouth daily at 6 PM. For cholesterol 90 tablet 3  . B Complex-C (B-COMPLEX WITH VITAMIN C) tablet Take 1 tablet by mouth as needed (for vitamin deficiency).    . clopidogrel (PLAVIX) 75 MG tablet Take 1 tablet (75 mg total) by mouth daily. 90 tablet 3  . gabapentin (NEURONTIN) 300 MG capsule Take 1 capsule (300 mg total) by mouth at bedtime. 30 capsule 2  . GLUCOSAMINE PO Take 1 capsule by mouth as needed.     Marland Kitchen glucose blood test strip Check Blood sugar QID Verio 120 each 12  . hydrochlorothiazide (HYDRODIURIL) 25 MG tablet Take 1 tablet (25 mg total) by mouth daily. 90 tablet 3  . HYDROcodone-acetaminophen (NORCO) 10-325 MG per tablet Take 1 tablet by mouth every 8 (eight) hours as needed for pain. 30 tablet 1  . insulin glargine (LANTUS) 100 UNIT/ML injection Inject 0.3 mLs (30 Units total) into the skin daily with breakfast. 10 mL 10  . insulin lispro (HUMALOG) 100 UNIT/ML injection Use 20 units before breakfast and lunch and 10 units before supper 10 mL 11  . Insulin Syringe-Needle U-100 (RELION INSULIN SYR 0.3ML/31G) 31G X 5/16" 0.3 ML MISC Inject 1 Stick into the skin See admin instructions. 100 each 2  . Insulin Syringes, Disposable, U-100 0.3 ML MISC 60 Units/day by Does not apply route 4 (four) times daily. 100 each 5  . Lancets (  ACCU-CHEK MULTICLIX) lancets Use as instructed. Test QID, Delica 741 each 12  . metFORMIN (GLUCOPHAGE) 1000 MG tablet TAKE ONE TABLET BY MOUTH TWICE DAILY WITH  A  MEAL 180 tablet 1  . metoprolol tartrate (LOPRESSOR) 25 MG tablet Take 0.5 tablets (12.5 mg total) by mouth 2 (two) times daily. 180 tablet 3  . montelukast (SINGULAIR) 10  MG tablet Take 1 tablet (10 mg total) by mouth at bedtime. 30 tablet 3  . nitroGLYCERIN (NITROSTAT) 0.4 MG SL tablet Place 1 tablet (0.4 mg total) under the tongue every 5 (five) minutes x 3 doses as needed for chest pain. 25 tablet 3  . sertraline (ZOLOFT) 50 MG tablet TAKE ONE TABLET BY MOUTH ONCE DAILY 30 tablet 1  . sildenafil (VIAGRA) 50 MG tablet Take 1 tablet (50 mg total) by mouth daily as needed for erectile dysfunction. 10 tablet 5  . ALPRAZolam (XANAX) 0.25 MG tablet Take 1 tablet (0.25 mg total) by mouth 3 (three) times daily as needed for anxiety. (Patient not taking: Reported on 03/31/2015) 30 tablet 3   No current facility-administered medications on file prior to visit.    ROS Review of Systems  Constitutional: Negative for fever, chills and diaphoresis.  HENT: Negative for congestion, rhinorrhea and sore throat.   Respiratory: Negative for cough, shortness of breath and wheezing.   Cardiovascular: Negative for chest pain.  Gastrointestinal: Negative for nausea, vomiting, abdominal pain, diarrhea, constipation and abdominal distention.  Genitourinary: Negative for dysuria and frequency.  Musculoskeletal: Negative for joint swelling and arthralgias.  Skin: Negative for rash.  Neurological: Negative for headaches.    Objective:  BP 134/86 mmHg  Pulse 71  Temp(Src) 97.1 F (36.2 C) (Oral)  Ht $R'5\' 6"'wu$  (1.676 m)  Wt 182 lb 9.6 oz (82.827 kg)  BMI 29.49 kg/m2  BP Readings from Last 3 Encounters:  03/31/15 134/86  03/02/15 130/75  02/22/15 136/78    Wt Readings from Last 3 Encounters:  03/31/15 182 lb 9.6 oz (82.827 kg)  03/02/15 186 lb 9.6 oz (84.641 kg)  02/22/15 188 lb 6.4 oz (85.458 kg)     Physical Exam  Constitutional: He is oriented to person, place, and time. He appears well-developed and well-nourished. No distress.  HENT:  Head: Normocephalic and atraumatic.  Right Ear: External ear normal.  Left Ear: External ear normal.  Nose: Nose normal.    Mouth/Throat: Oropharynx is clear and moist.  Eyes: Conjunctivae and EOM are normal. Pupils are equal, round, and reactive to light.  Neck: Normal range of motion. Neck supple. No thyromegaly present.  Cardiovascular: Normal rate, regular rhythm and normal heart sounds.   No murmur heard. Pulmonary/Chest: Effort normal and breath sounds normal. No respiratory distress. He has no wheezes. He has no rales.  Abdominal: Soft. Bowel sounds are normal. He exhibits no distension. There is no tenderness.  Lymphadenopathy:    He has no cervical adenopathy.  Neurological: He is alert and oriented to person, place, and time. He has normal reflexes.  Skin: Skin is warm and dry.  Psychiatric: He has a normal mood and affect. His behavior is normal. Judgment and thought content normal.    Lab Results  Component Value Date   HGBA1C 6.8% 12/22/2014   HGBA1C 7.3% 09/22/2014   HGBA1C 6.3 06/01/2013    Lab Results  Component Value Date   WBC 6.8 09/22/2014   HGB 14.3 09/22/2014   HCT 44.8 09/22/2014   PLT 313 01/21/2013   GLUCOSE 219* 09/22/2014   CHOL  195 09/22/2014   TRIG 215* 09/22/2014   HDL 51 09/22/2014   LDLCALC 101* 09/22/2014   ALT 16 09/22/2014   AST 19 09/22/2014   NA 139 09/22/2014   K 5.4* 09/22/2014   CL 99 09/22/2014   CREATININE 1.16 09/22/2014   BUN 15 09/22/2014   CO2 24 09/22/2014   TSH 2.880 09/22/2014   PSA 0.6 09/22/2014   INR 1.16 01/21/2013   HGBA1C 6.8% 12/22/2014    No results found.  Assessment & Plan:   Oluwatobiloba was seen today for diabetes and hypertension.  Diagnoses and all orders for this visit:  Diabetic peripheral neuropathy Orders: -     POCT CBC -     Microalbumin, urine -     CMP14+EGFR -     POCT glycosylated hemoglobin (Hb A1C)  Poorly controlled type 2 diabetes mellitus  Essential hypertension Orders: -     POCT CBC -     Microalbumin, urine -     CMP14+EGFR -     POCT glycosylated hemoglobin (Hb A1C)  Hyperlipemia Orders: -      POCT CBC -     Microalbumin, urine -     CMP14+EGFR -     POCT glycosylated hemoglobin (Hb A1C) -     Lipid panel  Allergic rhinitis, unspecified allergic rhinitis type Orders: -     fluticasone (FLONASE) 50 MCG/ACT nasal spray; Place 2 sprays into both nostrils daily.  Other orders -     lansoprazole (PREVACID) 30 MG capsule; Take 1 capsule (30 mg total) by mouth daily at 12 noon.  I have discontinued [name]'s benzonatate and HYDROcodone-homatropine. I am also having him start on lansoprazole. Additionally, I am having him maintain his GLUCOSAMINE PO, aspirin, nitroGLYCERIN, ALPRAZolam, B-complex with vitamin C, Insulin Syringes (Disposable), HYDROcodone-acetaminophen, metoprolol tartrate, clopidogrel, atorvastatin, allopurinol, gabapentin, sildenafil, glucose blood, accu-chek multiclix, insulin glargine, insulin lispro, sertraline, Insulin Syringe-Needle U-100, hydrochlorothiazide, montelukast, metFORMIN, and fluticasone.  Meds ordered this encounter  Medications  . lansoprazole (PREVACID) 30 MG capsule    Sig: Take 1 capsule (30 mg total) by mouth daily at 12 noon.    Dispense:  30 capsule    Refill:  5  . fluticasone (FLONASE) 50 MCG/ACT nasal spray    Sig: Place 2 sprays into both nostrils daily.    Dispense:  16 g    Refill:  6     Follow-up: Return in about 3 months (around 07/01/2015).  Claretta Fraise, M.D.

## 2015-03-31 NOTE — Telephone Encounter (Signed)
Please call for results. Lab results for CBC are good.

## 2015-04-01 LAB — CMP14+EGFR
A/G RATIO: 1.7 (ref 1.1–2.5)
ALT: 21 IU/L (ref 0–44)
AST: 20 IU/L (ref 0–40)
Albumin: 4.1 g/dL (ref 3.5–5.5)
Alkaline Phosphatase: 62 IU/L (ref 39–117)
BILIRUBIN TOTAL: 0.3 mg/dL (ref 0.0–1.2)
BUN/Creatinine Ratio: 15 (ref 9–20)
BUN: 16 mg/dL (ref 6–24)
CO2: 23 mmol/L (ref 18–29)
Calcium: 8.6 mg/dL — ABNORMAL LOW (ref 8.7–10.2)
Chloride: 102 mmol/L (ref 97–108)
Creatinine, Ser: 1.07 mg/dL (ref 0.76–1.27)
GFR, EST AFRICAN AMERICAN: 87 mL/min/{1.73_m2} (ref >59)
GFR, EST NON AFRICAN AMERICAN: 76 mL/min/{1.73_m2} (ref >59)
GLUCOSE: 156 mg/dL — AB (ref 65–99)
Globulin, Total: 2.4 g/dL (ref 1.5–4.5)
Potassium: 5.4 mmol/L — ABNORMAL HIGH (ref 3.5–5.2)
Sodium: 139 mmol/L (ref 134–144)
TOTAL PROTEIN: 6.5 g/dL (ref 6.0–8.5)

## 2015-04-01 LAB — LIPID PANEL
CHOL/HDL RATIO: 2.2 ratio (ref 0.0–5.0)
Cholesterol, Total: 115 mg/dL (ref 100–199)
HDL: 53 mg/dL (ref >39)
LDL Calculated: 43 mg/dL (ref 0–99)
Triglycerides: 97 mg/dL (ref 0–149)
VLDL CHOLESTEROL CAL: 19 mg/dL (ref 5–40)

## 2015-04-01 LAB — MICROALBUMIN, URINE

## 2015-04-05 ENCOUNTER — Other Ambulatory Visit: Payer: Self-pay

## 2015-04-05 MED ORDER — METFORMIN HCL 1000 MG PO TABS: ORAL_TABLET | ORAL | Status: DC

## 2015-04-08 ENCOUNTER — Telehealth: Payer: Self-pay | Admitting: Family Medicine

## 2015-04-08 DIAGNOSIS — IMO0002 Reserved for concepts with insufficient information to code with codable children: Secondary | ICD-10-CM

## 2015-04-08 NOTE — Telephone Encounter (Signed)
Referral to Dr Allyne Gee has already been done and so he only needs referral to Dr Alvino Chapel. Referral placed

## 2015-04-12 ENCOUNTER — Encounter: Payer: Self-pay | Admitting: *Deleted

## 2015-04-22 ENCOUNTER — Ambulatory Visit (INDEPENDENT_AMBULATORY_CARE_PROVIDER_SITE_OTHER): Payer: 59 | Admitting: Cardiology

## 2015-04-22 ENCOUNTER — Encounter: Payer: Self-pay | Admitting: Cardiology

## 2015-04-22 VITALS — BP 134/80 | HR 65 | Ht 66.0 in | Wt 185.1 lb

## 2015-04-22 DIAGNOSIS — E1342 Other specified diabetes mellitus with diabetic polyneuropathy: Secondary | ICD-10-CM | POA: Diagnosis not present

## 2015-04-22 DIAGNOSIS — I2510 Atherosclerotic heart disease of native coronary artery without angina pectoris: Secondary | ICD-10-CM | POA: Diagnosis not present

## 2015-04-22 DIAGNOSIS — I10 Essential (primary) hypertension: Secondary | ICD-10-CM | POA: Diagnosis not present

## 2015-04-22 NOTE — Progress Notes (Signed)
Ethan Peters Date of Birth: 1956/07/14 Medical Record #[id number]  History of Present Illness: Mr. Gabehart is seen for followup of CAD. He is status post inferior STEMI on 01/09/2013. He had stenting of the RCA at the crux. He had persistent chest pain and dyspnea even after his infarct with atypical symptoms. He subsequently underwent repeat cardiac catheterization on April 10,2014 which showed excellent patency of the stent in the RCA. He does have a long 70% stenosis in the left circumflex,  treated medically. He had a Myovew study in July 2014 which showed an inferior scar without ischemia. EF was normal.  He has a history of poorly controlled diabetes mellitus with retinopathy and neuropathy. He is now followed by Dr. Darlyn Read. He denies any chest pain or dyspnea. He is more active walking 15 minutes twice a day. He does note some transient lightheadedness when walking. Left arm tingles when sitting or lying. Intermittent leg cramps at night.     Current Outpatient Prescriptions on File Prior to Visit  Medication Sig Dispense Refill  . allopurinol (ZYLOPRIM) 300 MG tablet Take 1 tablet (300 mg total) by mouth daily as needed (gout pain). 90 tablet 3  . ALPRAZolam (XANAX) 0.25 MG tablet Take 1 tablet (0.25 mg total) by mouth 3 (three) times daily as needed for anxiety. 30 tablet 3  . aspirin EC 81 MG EC tablet Take 1 tablet (81 mg total) by mouth daily.    Marland Kitchen atorvastatin (LIPITOR) 80 MG tablet Take 1 tablet (80 mg total) by mouth daily at 6 PM. For cholesterol 90 tablet 3  . B Complex-C (B-COMPLEX WITH VITAMIN C) tablet Take 1 tablet by mouth as needed (for vitamin deficiency).    . clopidogrel (PLAVIX) 75 MG tablet Take 1 tablet (75 mg total) by mouth daily. 90 tablet 3  . fluticasone (FLONASE) 50 MCG/ACT nasal spray Place 2 sprays into both nostrils daily. 16 g 6  . gabapentin (NEURONTIN) 300 MG capsule Take 1 capsule (300 mg total) by mouth at bedtime. 30 capsule 2  . GLUCOSAMINE PO Take  1 capsule by mouth as needed.     Marland Kitchen glucose blood test strip Check Blood sugar QID Verio 120 each 12  . hydrochlorothiazide (HYDRODIURIL) 25 MG tablet Take 1 tablet (25 mg total) by mouth daily. 90 tablet 3  . HYDROcodone-acetaminophen (NORCO) 10-325 MG per tablet Take 1 tablet by mouth every 8 (eight) hours as needed for pain. 30 tablet 1  . insulin glargine (LANTUS) 100 UNIT/ML injection Inject 0.3 mLs (30 Units total) into the skin daily with breakfast. 10 mL 10  . insulin lispro (HUMALOG) 100 UNIT/ML injection Use 20 units before breakfast and lunch and 10 units before supper 10 mL 11  . Insulin Syringe-Needle U-100 (RELION INSULIN SYR 0.3ML/31G) 31G X 5/16" 0.3 ML MISC Inject 1 Stick into the skin See admin instructions. 100 each 2  . Insulin Syringes, Disposable, U-100 0.3 ML MISC 60 Units/day by Does not apply route 4 (four) times daily. 100 each 5  . Lancets (ACCU-CHEK MULTICLIX) lancets Use as instructed. Test QID, Delica 120 each 12  . lansoprazole (PREVACID) 30 MG capsule Take 1 capsule (30 mg total) by mouth daily at 12 noon. 30 capsule 5  . metoprolol tartrate (LOPRESSOR) 25 MG tablet Take 0.5 tablets (12.5 mg total) by mouth 2 (two) times daily. 180 tablet 3  . montelukast (SINGULAIR) 10 MG tablet Take 1 tablet (10 mg total) by mouth at bedtime. 30 tablet 3  .  nitroGLYCERIN (NITROSTAT) 0.4 MG SL tablet Place 1 tablet (0.4 mg total) under the tongue every 5 (five) minutes x 3 doses as needed for chest pain. 25 tablet 3  . sertraline (ZOLOFT) 50 MG tablet TAKE ONE TABLET BY MOUTH ONCE DAILY 30 tablet 1  . sildenafil (VIAGRA) 50 MG tablet Take 1 tablet (50 mg total) by mouth daily as needed for erectile dysfunction. 10 tablet 5   No current facility-administered medications on file prior to visit.    Allergies  Allergen Reactions  . Levemir [Insulin Detemir] Swelling  . Penicillins Rash    Lip swelling    Past Medical History  Diagnosis Date  . Hypertension   . Diabetes  mellitus     Uncontrolled, Hgb A1C 14.8% on 03/14, started on insulin  . CAD (coronary artery disease) 01/09/13    Inferior STEMI s/p DES-RCA  . Ischemic cardiomyopathy     EF 45-50%, grade 1 diastolic dysfunction, mildly dilated RV, RA at the upper limits of normal, mild TR, PA systolic pressure 32 mm mercury and hypokinesis to akinesis of the basal mid inferior myocardium  . Myocardial infarction 01/09/2013  . Shortness of breath   . Gout     Past Surgical History  Procedure Laterality Date  . Coronary angioplasty with stent placement  01/09/2013    30% pLAD, 30% mLAD, 70% pLCx, RCA occlusion at crux with R->L distal collaterals s/p DES; LVEF 50%, moderate-severe HK of inferior basal wall  . Cardiac catheterization  01/22/2013    Diffuse borderline residual CAD consistent with uncontrolled diabetes, medical management recommended  . Left heart catheterization with coronary angiogram N/A 01/09/2013    Procedure: LEFT HEART CATHETERIZATION WITH CORONARY ANGIOGRAM;  Surgeon: Winifred Bodiford M [place], MD;  Location: [facility] CATH LAB;  Service: Cardiovascular;  Laterality: N/A;  . Left heart catheterization with coronary angiogram N/A 01/22/2013    Procedure: LEFT HEART CATHETERIZATION WITH CORONARY ANGIOGRAM;  Surgeon: Tonny Bollman, MD;  Location: [facility] CATH LAB;  Service: Cardiovascular;  Laterality: N/A;    History  Smoking status  . Never Smoker   Smokeless tobacco  . Never Used    History  Alcohol Use  . Yes    Comment: i have not drank in a year "    Family History  Problem Relation Age of Onset  . Heart disease Mother   . Heart disease Father     Review of Systems: As noted in history of present illness.  All other systems were reviewed and are negative.  Physical Exam: BP 134/80 mmHg  Pulse 65  Ht  (1.676 m)  Wt 83.961 kg (185 lb 1.6 oz)  BMI 29.89 kg/m2 He is a pleasant Latino male in no acute distress. HEENT: Normal. Lungs: Clear Cardiovascular: Regular rate and rhythm  without gallop, murmur, or click. He has no chest wall pain to palpation.  Abdomen: Soft and nontender. No masses or bruits. Bowel sounds are positive. Extremities: No edema. Radial and pedal pulses are 2+. Skin: Warm and dry Neuro: Alert and oriented x3. Normal motor and sensory exam.  LABORATORY DATA: Lab Results  Component Value Date   WBC 7.3 03/31/2015   HGB 12.6* 03/31/2015   HCT 38.3* 03/31/2015   PLT 313 01/21/2013   GLUCOSE 156* 03/31/2015   CHOL 115 03/31/2015   TRIG 97 03/31/2015   HDL 53 03/31/2015   LDLCALC 43 03/31/2015   ALT 21 03/31/2015   AST 20 03/31/2015   NA 139 03/31/2015   K 5.4*  03/31/2015   CL 102 03/31/2015   CREATININE 1.07 03/31/2015   BUN 16 03/31/2015   CO2 23 03/31/2015   TSH 2.880 09/22/2014   PSA 0.6 09/22/2014   INR 1.16 01/21/2013   HGBA1C 7.4 03/31/2015   Ecg today shows NSR with old inferior infarct. Otherwise normal. I have personally reviewed and interpreted this study.   Assessment / Plan: 1. Coronary disease status post inferior STEMI treated with DES to the RCA. Repeat cardiac catheterization demonstrated continued patency. Moderate diffuse disease in the left circumflex. No ischemia by [facility] July 2014. We will continue aspirin and statin. He is currently asymptomatic. I will follow up in 6 months.   2. Diabetes mellitus: On metformin, glipizide, and insulin . Followed by primary care.  3. Dyslipidemia. On statin therapy. Recent lipids looked excellent.   4. Hypertension, BP well controlled.  On metoprolol and HCTZ. Will monitor.  5. Diabetic retinopathy.

## 2015-04-22 NOTE — Patient Instructions (Signed)
Continue your current therapy  I will see you in 6 months.   

## 2015-04-26 ENCOUNTER — Other Ambulatory Visit: Payer: Self-pay | Admitting: *Deleted

## 2015-04-26 ENCOUNTER — Other Ambulatory Visit: Payer: Self-pay | Admitting: Family Medicine

## 2015-04-26 MED ORDER — ATORVASTATIN CALCIUM 80 MG PO TABS: 80.0000 mg | ORAL_TABLET | Freq: Every day | ORAL | Status: DC

## 2015-04-26 MED ORDER — INSULIN GLARGINE 100 UNIT/ML ~~LOC~~ SOLN: 30.0000 [IU] | Freq: Every day | SUBCUTANEOUS | Status: DC

## 2015-04-26 MED ORDER — METOPROLOL TARTRATE 25 MG PO TABS: 12.5000 mg | ORAL_TABLET | Freq: Two times a day (BID) | ORAL | Status: DC

## 2015-04-26 MED ORDER — CLOPIDOGREL BISULFATE 75 MG PO TABS: 75.0000 mg | ORAL_TABLET | Freq: Every day | ORAL | Status: DC

## 2015-04-28 ENCOUNTER — Telehealth: Payer: Self-pay | Admitting: Family Medicine

## 2015-04-29 NOTE — Telephone Encounter (Signed)
error 

## 2015-05-03 ENCOUNTER — Telehealth: Payer: Self-pay

## 2015-05-03 NOTE — Telephone Encounter (Signed)
Received surgical clearance from [facility].Dr.Jordan cleared patient for upcoming eye procedure.Form and 04/22/15 office note faxed back to fax # [phone].

## 2015-05-09 ENCOUNTER — Telehealth: Payer: Self-pay | Admitting: Family Medicine

## 2015-05-09 MED ORDER — SILDENAFIL CITRATE 50 MG PO TABS: 50.0000 mg | ORAL_TABLET | Freq: Every day | ORAL | Status: DC | PRN

## 2015-05-09 NOTE — Telephone Encounter (Signed)
Done. Please notify patient. Thanks, WS.

## 2015-06-07 ENCOUNTER — Telehealth: Payer: Self-pay | Admitting: Family Medicine

## 2015-06-28 ENCOUNTER — Other Ambulatory Visit: Payer: Self-pay | Admitting: Family Medicine

## 2015-07-27 ENCOUNTER — Other Ambulatory Visit: Payer: Self-pay

## 2015-07-27 MED ORDER — GLUCOSE BLOOD VI STRP: ORAL_STRIP | Status: DC

## 2015-08-01 ENCOUNTER — Ambulatory Visit (INDEPENDENT_AMBULATORY_CARE_PROVIDER_SITE_OTHER): Payer: PPO

## 2015-08-01 ENCOUNTER — Ambulatory Visit (INDEPENDENT_AMBULATORY_CARE_PROVIDER_SITE_OTHER): Payer: PPO | Admitting: Family Medicine

## 2015-08-01 ENCOUNTER — Encounter: Payer: Self-pay | Admitting: Family Medicine

## 2015-08-01 VITALS — BP 138/85 | HR 64 | Temp 97.4°F | Ht 66.0 in | Wt 181.8 lb

## 2015-08-01 DIAGNOSIS — E119 Type 2 diabetes mellitus without complications: Secondary | ICD-10-CM

## 2015-08-01 DIAGNOSIS — R0781 Pleurodynia: Secondary | ICD-10-CM

## 2015-08-01 DIAGNOSIS — E785 Hyperlipidemia, unspecified: Secondary | ICD-10-CM | POA: Diagnosis not present

## 2015-08-01 DIAGNOSIS — J069 Acute upper respiratory infection, unspecified: Secondary | ICD-10-CM | POA: Diagnosis not present

## 2015-08-01 DIAGNOSIS — I10 Essential (primary) hypertension: Secondary | ICD-10-CM

## 2015-08-01 DIAGNOSIS — J309 Allergic rhinitis, unspecified: Secondary | ICD-10-CM | POA: Diagnosis not present

## 2015-08-01 DIAGNOSIS — Z794 Long term (current) use of insulin: Secondary | ICD-10-CM | POA: Diagnosis not present

## 2015-08-01 LAB — POCT GLYCOSYLATED HEMOGLOBIN (HGB A1C): Hemoglobin A1C: 6.5

## 2015-08-01 MED ORDER — METOPROLOL TARTRATE 25 MG PO TABS: 12.5000 mg | ORAL_TABLET | Freq: Two times a day (BID) | ORAL | Status: DC

## 2015-08-01 MED ORDER — ACCU-CHEK MULTICLIX LANCETS MISC
Status: DC
Start: 2015-08-01 — End: 2016-07-31

## 2015-08-01 MED ORDER — INSULIN SYRINGES (DISPOSABLE) U-100 0.3 ML MISC: 60.0000 [IU]/d | Freq: Four times a day (QID) | Status: DC

## 2015-08-01 MED ORDER — CELECOXIB 200 MG PO CAPS: 200.0000 mg | ORAL_CAPSULE | Freq: Every day | ORAL | Status: DC

## 2015-08-01 MED ORDER — SERTRALINE HCL 50 MG PO TABS: 50.0000 mg | ORAL_TABLET | Freq: Every day | ORAL | Status: DC

## 2015-08-01 MED ORDER — INSULIN GLARGINE 100 UNIT/ML ~~LOC~~ SOLN: 30.0000 [IU] | Freq: Every day | SUBCUTANEOUS | Status: DC

## 2015-08-01 MED ORDER — LANSOPRAZOLE 30 MG PO CPDR: 30.0000 mg | DELAYED_RELEASE_CAPSULE | Freq: Every day | ORAL | Status: DC

## 2015-08-01 MED ORDER — METFORMIN HCL 1000 MG PO TABS: 1000.0000 mg | ORAL_TABLET | Freq: Every day | ORAL | Status: DC

## 2015-08-01 MED ORDER — ALLOPURINOL 300 MG PO TABS: ORAL_TABLET | ORAL | Status: DC

## 2015-08-01 MED ORDER — INSULIN LISPRO 100 UNIT/ML ~~LOC~~ SOLN: SUBCUTANEOUS | Status: DC

## 2015-08-01 MED ORDER — NITROGLYCERIN 0.4 MG SL SUBL: 0.4000 mg | SUBLINGUAL_TABLET | SUBLINGUAL | Status: DC | PRN

## 2015-08-01 MED ORDER — MONTELUKAST SODIUM 10 MG PO TABS: 10.0000 mg | ORAL_TABLET | Freq: Every day | ORAL | Status: DC

## 2015-08-01 MED ORDER — "INSULIN SYRINGE-NEEDLE U-100 31G X 5/16"" 0.3 ML MISC": 1.0000 | Status: DC

## 2015-08-01 NOTE — Progress Notes (Signed)
Subjective:  Patient ID: Ethan Peters, male    DOB: 1956/05/21  Age: 59 y.o. MRN: [id number]  CC: Diabetes; Hypertension; Hyperlipidemia; and Gout   HPI Ethan Peters presents for  follow-up of hypertension. Patient has no history of headache chest pain or shortness of breath or recent cough. Patient also denies symptoms of TIA such as numbness weakness lateralizing. Patient checks  blood pressure at home and has not had any elevated readings recently. Patient denies side effects from his medication. States taking it regularly.  Patient also  in for follow-up of elevated cholesterol. Doing well without complaints on current medication. Denies side effects of statin including myalgia and arthralgia and nausea. Also in today for liver function testing. Currently no chest pain, shortness of breath or other cardiovascular related symptoms noted.  Follow-up of diabetes. Patient does check blood sugar at home. Readings run between 115 and 123 fasting. Log not returned Patient denies symptoms such as polyuria, polydipsia, excessive hunger, nausea A few easily treated  significant hypoglycemic spells noted. Felt weak, nauseated Medications as noted below. Taking them regularly without complication/adverse reaction being reported today.    History Ethan Peters has a past medical history of Hypertension; Diabetes mellitus; CAD (coronary artery disease) (01/09/13); Ischemic cardiomyopathy; Myocardial infarction ([place]) (01/09/2013); Shortness of breath; and Gout.   He has past surgical history that includes Coronary angioplasty with stent (01/09/2013); Cardiac catheterization (01/22/2013); left heart catheterization with coronary angiogram (N/A, 01/09/2013); and left heart catheterization with coronary angiogram (N/A, 01/22/2013).   His family history includes Heart disease in his father and mother.He reports that he has never smoked. He has never used smokeless tobacco. He reports that he drinks alcohol.  He reports that he does not use illicit drugs.  Current Outpatient Prescriptions on File Prior to Visit  Medication Sig Dispense Refill  . aspirin EC 81 MG EC tablet Take 1 tablet (81 mg total) by mouth daily.    Marland Kitchen atorvastatin (LIPITOR) 80 MG tablet Take 1 tablet (80 mg total) by mouth daily at 6 PM. For cholesterol 90 tablet 3  . B Complex-C (B-COMPLEX WITH VITAMIN C) tablet Take 1 tablet by mouth as needed (for vitamin deficiency).    . clopidogrel (PLAVIX) 75 MG tablet Take 1 tablet (75 mg total) by mouth daily. 90 tablet 3  . fluticasone (FLONASE) 50 MCG/ACT nasal spray Place 2 sprays into both nostrils daily. 16 g 6  . gabapentin (NEURONTIN) 300 MG capsule Take 1 capsule (300 mg total) by mouth at bedtime. 30 capsule 2  . GLUCOSAMINE PO Take 1 capsule by mouth as needed.     Marland Kitchen glucose blood test strip Check Blood sugar QID Verio 150 each 1  . hydrochlorothiazide (HYDRODIURIL) 25 MG tablet Take 1 tablet (25 mg total) by mouth daily. 90 tablet 3  . HYDROcodone-acetaminophen (NORCO) 10-325 MG per tablet Take 1 tablet by mouth every 8 (eight) hours as needed for pain. 30 tablet 1  . ALPRAZolam (XANAX) 0.25 MG tablet Take 1 tablet (0.25 mg total) by mouth 3 (three) times daily as needed for anxiety. (Patient not taking: Reported on 08/01/2015) 30 tablet 3  . sildenafil (VIAGRA) 50 MG tablet Take 1 tablet (50 mg total) by mouth daily as needed for erectile dysfunction. (Patient not taking: Reported on 08/01/2015) 10 tablet 5   No current facility-administered medications on file prior to visit.    ROS Review of Systems  Constitutional: Negative for fever, chills and diaphoresis.  HENT: Negative for congestion, rhinorrhea  and sore throat.   Respiratory: Negative for cough, shortness of breath and wheezing.   Cardiovascular: Negative for chest pain.  Gastrointestinal: Negative for nausea, vomiting, abdominal pain, diarrhea, constipation and abdominal distention.  Genitourinary: Negative for  dysuria and frequency.  Musculoskeletal: Negative for joint swelling and arthralgias.  Skin: Negative for rash.  Neurological: Negative for headaches.    Objective:  BP 138/85 mmHg  Pulse 64  Temp(Src) 97.4 F (36.3 C) (Oral)  Ht '5\' 6"'  (1.676 m)  Wt 181 lb 12.8 oz (82.464 kg)  BMI 29.36 kg/m2  BP Readings from Last 3 Encounters:  08/01/15 138/85  04/22/15 134/80  03/31/15 134/86    Wt Readings from Last 3 Encounters:  08/01/15 181 lb 12.8 oz (82.464 kg)  04/22/15 185 lb 1.6 oz (83.961 kg)  03/31/15 182 lb 9.6 oz (82.827 kg)     Physical Exam  Constitutional: He is oriented to person, place, and time. He appears well-developed and well-nourished. No distress.  HENT:  Head: Normocephalic and atraumatic.  Right Ear: External ear normal.  Left Ear: External ear normal.  Nose: Nose normal.  Mouth/Throat: Oropharynx is clear and moist.  Eyes: Conjunctivae and EOM are normal. Pupils are equal, round, and reactive to light.  Neck: Normal range of motion. Neck supple. No thyromegaly present.  Cardiovascular: Normal rate, regular rhythm and normal heart sounds.   No murmur heard. Pulmonary/Chest: Effort normal and breath sounds normal. No respiratory distress. He has no wheezes. He has no rales.  Abdominal: Soft. Bowel sounds are normal. He exhibits no distension. There is no tenderness.  Lymphadenopathy:    He has no cervical adenopathy.  Neurological: He is alert and oriented to person, place, and time. He has normal reflexes.  Skin: Skin is warm and dry.  Psychiatric: He has a normal mood and affect. His behavior is normal. Judgment and thought content normal.    Lab Results  Component Value Date   HGBA1C 6.5 08/01/2015   HGBA1C 7.4 03/31/2015   HGBA1C 6.8% 12/22/2014    Lab Results  Component Value Date   WBC 7.3 03/31/2015   HGB 12.6* 03/31/2015   HCT 38.3* 03/31/2015   PLT 313 01/21/2013   GLUCOSE 156* 03/31/2015   CHOL 115 03/31/2015   TRIG 97 03/31/2015     HDL 53 03/31/2015   LDLCALC 43 03/31/2015   ALT 21 03/31/2015   AST 20 03/31/2015   NA 139 03/31/2015   K 5.4* 03/31/2015   CL 102 03/31/2015   CREATININE 1.07 03/31/2015   BUN 16 03/31/2015   CO2 23 03/31/2015   TSH 2.880 09/22/2014   PSA 0.6 09/22/2014   INR 1.16 01/21/2013   HGBA1C 6.5 08/01/2015    No results found.  Assessment & Plan:   Zaire was seen today for diabetes, hypertension, hyperlipidemia and gout.  Diagnoses and all orders for this visit:  Essential hypertension -     CBC with Differential/Platelet  Long term current use of insulin (HCC)  Hyperlipemia -     CMP14+EGFR -     Lipid panel  Controlled type 2 diabetes mellitus without complication, unspecified long term insulin use status (HCC) -     POCT glycosylated hemoglobin (Hb A1C)  Pleuritic pain -     DG Chest 2 View; Future  Acute upper respiratory infection -     montelukast (SINGULAIR) 10 MG tablet; Take 1 tablet (10 mg total) by mouth at bedtime.  Allergic rhinitis, unspecified allergic rhinitis type -  montelukast (SINGULAIR) 10 MG tablet; Take 1 tablet (10 mg total) by mouth at bedtime.  Other orders -     allopurinol (ZYLOPRIM) 300 MG tablet; TAKE ONE TABLET BY MOUTH ONCE DAILY AS NEEDED FOR GOUT PAIN -     insulin glargine (LANTUS) 100 UNIT/ML injection; Inject 0.3 mLs (30 Units total) into the skin daily with breakfast. -     insulin lispro (HUMALOG) 100 UNIT/ML injection; Use 20 units before breakfast and lunch and 10 units before supper -     Insulin Syringes, Disposable, U-100 0.3 ML MISC; 60 Units/day by Does not apply route 4 (four) times daily. -     Lancets (ACCU-CHEK MULTICLIX) lancets; Use as instructed. Test QID, Delica -     Insulin Syringe-Needle U-100 (RELION INSULIN SYR 0.3ML/31G) 31G X 5/16" 0.3 ML MISC; Inject 1 Stick into the skin See admin instructions. -     metFORMIN (GLUCOPHAGE) 1000 MG tablet; Take 1 tablet (1,000 mg total) by mouth daily with breakfast. -      metoprolol tartrate (LOPRESSOR) 25 MG tablet; Take 0.5 tablets (12.5 mg total) by mouth 2 (two) times daily. -     lansoprazole (PREVACID) 30 MG capsule; Take 1 capsule (30 mg total) by mouth daily at 12 noon. -     nitroGLYCERIN (NITROSTAT) 0.4 MG SL tablet; Place 1 tablet (0.4 mg total) under the tongue every 5 (five) minutes x 3 doses as needed for chest pain. -     sertraline (ZOLOFT) 50 MG tablet; Take 1 tablet (50 mg total) by mouth daily. -     celecoxib (CELEBREX) 200 MG capsule; Take 1 capsule (200 mg total) by mouth daily. With food   I have changed Mr. Brull's metFORMIN and sertraline. I am also having him start on celecoxib. Additionally, I am having him maintain his GLUCOSAMINE PO, aspirin, ALPRAZolam, B-complex with vitamin C, HYDROcodone-acetaminophen, gabapentin, hydrochlorothiazide, fluticasone, atorvastatin, clopidogrel, sildenafil, glucose blood, allopurinol, insulin glargine, insulin lispro, Insulin Syringes (Disposable), accu-chek multiclix, Insulin Syringe-Needle U-100, metoprolol tartrate, montelukast, lansoprazole, and nitroGLYCERIN.  Meds ordered this encounter  Medications  . allopurinol (ZYLOPRIM) 300 MG tablet    Sig: TAKE ONE TABLET BY MOUTH ONCE DAILY AS NEEDED FOR GOUT PAIN    Dispense:  90 tablet    Refill:  3  . insulin glargine (LANTUS) 100 UNIT/ML injection    Sig: Inject 0.3 mLs (30 Units total) into the skin daily with breakfast.    Dispense:  10 mL    Refill:  10  . insulin lispro (HUMALOG) 100 UNIT/ML injection    Sig: Use 20 units before breakfast and lunch and 10 units before supper    Dispense:  10 mL    Refill:  11  . Insulin Syringes, Disposable, U-100 0.3 ML MISC    Sig: 60 Units/day by Does not apply route 4 (four) times daily.    Dispense:  100 each    Refill:  5  . Lancets (ACCU-CHEK MULTICLIX) lancets    Sig: Use as instructed. Test QID, Delica    Dispense:  660 each    Refill:  12    DX: Y30.1 Delica  . Insulin Syringe-Needle  U-100 (RELION INSULIN SYR 0.3ML/31G) 31G X 5/16" 0.3 ML MISC    Sig: Inject 1 Stick into the skin See admin instructions.    Dispense:  100 each    Refill:  2    E11.9  . metFORMIN (GLUCOPHAGE) 1000 MG tablet    Sig: Take 1 tablet (  1,000 mg total) by mouth daily with breakfast.    Dispense:  30 tablet    Refill:  5  . metoprolol tartrate (LOPRESSOR) 25 MG tablet    Sig: Take 0.5 tablets (12.5 mg total) by mouth 2 (two) times daily.    Dispense:  180 tablet    Refill:  3  . montelukast (SINGULAIR) 10 MG tablet    Sig: Take 1 tablet (10 mg total) by mouth at bedtime.    Dispense:  30 tablet    Refill:  5  . lansoprazole (PREVACID) 30 MG capsule    Sig: Take 1 capsule (30 mg total) by mouth daily at 12 noon.    Dispense:  30 capsule    Refill:  5  . nitroGLYCERIN (NITROSTAT) 0.4 MG SL tablet    Sig: Place 1 tablet (0.4 mg total) under the tongue every 5 (five) minutes x 3 doses as needed for chest pain.    Dispense:  25 tablet    Refill:  11  . sertraline (ZOLOFT) 50 MG tablet    Sig: Take 1 tablet (50 mg total) by mouth daily.    Dispense:  30 tablet    Refill:  5  . celecoxib (CELEBREX) 200 MG capsule    Sig: Take 1 capsule (200 mg total) by mouth daily. With food    Dispense:  30 capsule    Refill:  5     Follow-up: No Follow-up on file.  Claretta Fraise, M.D.

## 2015-08-02 LAB — CMP14+EGFR
ALBUMIN: 4.4 g/dL (ref 3.5–5.5)
ALT: 25 IU/L (ref 0–44)
AST: 51 IU/L — ABNORMAL HIGH (ref 0–40)
Albumin/Globulin Ratio: 1.8 (ref 1.1–2.5)
Alkaline Phosphatase: 83 IU/L (ref 39–117)
BUN / CREAT RATIO: 29 — AB (ref 9–20)
BUN: 28 mg/dL — ABNORMAL HIGH (ref 6–24)
Bilirubin Total: 0.3 mg/dL (ref 0.0–1.2)
CALCIUM: 9.4 mg/dL (ref 8.7–10.2)
CO2: 23 mmol/L (ref 18–29)
CREATININE: 0.96 mg/dL (ref 0.76–1.27)
Chloride: 100 mmol/L (ref 97–106)
GFR calc Af Amer: 100 mL/min/{1.73_m2} (ref >59)
GFR, EST NON AFRICAN AMERICAN: 86 mL/min/{1.73_m2} (ref >59)
GLOBULIN, TOTAL: 2.5 g/dL (ref 1.5–4.5)
Glucose: 108 mg/dL — ABNORMAL HIGH (ref 65–99)
Potassium: 4.4 mmol/L (ref 3.5–5.2)
SODIUM: 138 mmol/L (ref 136–144)
Total Protein: 6.9 g/dL (ref 6.0–8.5)

## 2015-08-02 LAB — CBC WITH DIFFERENTIAL/PLATELET
Basophils Absolute: 0 10*3/uL (ref 0.0–0.2)
Basos: 1 %
EOS (ABSOLUTE): 0.2 10*3/uL (ref 0.0–0.4)
Eos: 2 %
HEMOGLOBIN: 12.7 g/dL (ref 12.6–17.7)
Hematocrit: 38.3 % (ref 37.5–51.0)
Immature Grans (Abs): 0 10*3/uL (ref 0.0–0.1)
Immature Granulocytes: 0 %
LYMPHS ABS: 2.6 10*3/uL (ref 0.7–3.1)
Lymphs: 30 %
MCH: 30.8 pg (ref 26.6–33.0)
MCHC: 33.2 g/dL (ref 31.5–35.7)
MCV: 93 fL (ref 79–97)
Monocytes Absolute: 0.4 10*3/uL (ref 0.1–0.9)
Monocytes: 5 %
Neutrophils Absolute: 5.3 10*3/uL (ref 1.4–7.0)
Neutrophils: 62 %
Platelets: 210 10*3/uL (ref 150–379)
RBC: 4.13 x10E6/uL — AB (ref 4.14–5.80)
RDW: 13.1 % (ref 12.3–15.4)
WBC: 8.5 10*3/uL (ref 3.4–10.8)

## 2015-08-02 LAB — LIPID PANEL
CHOL/HDL RATIO: 3.4 ratio (ref 0.0–5.0)
Cholesterol, Total: 140 mg/dL (ref 100–199)
HDL: 41 mg/dL (ref >39)
LDL CALC: 82 mg/dL (ref 0–99)
Triglycerides: 84 mg/dL (ref 0–149)
VLDL Cholesterol Cal: 17 mg/dL (ref 5–40)

## 2015-08-22 ENCOUNTER — Ambulatory Visit (INDEPENDENT_AMBULATORY_CARE_PROVIDER_SITE_OTHER): Payer: PPO

## 2015-08-22 DIAGNOSIS — Z23 Encounter for immunization: Secondary | ICD-10-CM

## 2015-09-28 ENCOUNTER — Telehealth: Payer: Self-pay | Admitting: Family Medicine

## 2015-09-28 ENCOUNTER — Other Ambulatory Visit: Payer: Self-pay | Admitting: Family Medicine

## 2015-09-28 NOTE — Telephone Encounter (Signed)
Please review and advise.

## 2015-09-29 NOTE — Telephone Encounter (Signed)
Stp and he states we are supposed to call his insurance company to talk to them about the insulin and he can get free insulin for a year [phone].

## 2015-09-29 NOTE — Telephone Encounter (Signed)
There isn't anything cheaper, but insurance may cover something else better. Toujeo, Levemir and tresibo could be substituted, if covered. Have him check his insurance formulary for these. Thanks, WS

## 2015-09-30 NOTE — Telephone Encounter (Signed)
Ethan Peters, is this something you or Debbi handle.  Patient states we can call his insurance company and he can get his insurance for free or at a reduced price.

## 2015-10-04 NOTE — Telephone Encounter (Signed)
Ethan Peters has and is handling

## 2015-10-07 NOTE — Telephone Encounter (Addendum)
I spoke with [employer] [facility] pharmacy benefits manager at the following phone number [phone].  I was told that there was not a cheaper alternative to Lantus and that copay was $90.  I told them that this was too expensive for [name].  I tier exception was initiated which might lower the tier of Lantus and decrease the monthly cost.  Will take 48 to 72 business hours to process.  They did not have any information about him getting his insulin or insurance free.  I recommend that patient meet with someone at [facility] office and they can help him submit LIS (low Income subsidy).  If he qualifies this would cover part or all of his Medicare cost and lower prescription drug coverage to about $6 per Rx.

## 2015-10-20 ENCOUNTER — Other Ambulatory Visit: Payer: Self-pay | Admitting: Pharmacist

## 2015-10-21 MED ORDER — METFORMIN HCL 1000 MG PO TABS: 1000.0000 mg | ORAL_TABLET | Freq: Two times a day (BID) | ORAL | Status: DC

## 2015-10-21 MED ORDER — INSULIN GLARGINE 100 UNIT/ML SOLOSTAR PEN: 30.0000 [IU] | PEN_INJECTOR | Freq: Every day | SUBCUTANEOUS | Status: DC

## 2015-10-21 NOTE — Telephone Encounter (Signed)
Patient is unable to afford his insulin - Lantus or Novolog.  Per [facility] his copay is $45 for Novolog (30 days supply) and $90 for Lantus (33 day supply).  I contacted his insurance company / [employer] at [phone].  They reported that all insulins were tier 3.  Submitted tier exception but this was denied.   I have printed information for patient in English and Spanish to apply for extra help with copays and monthly premiums.  He is out of Lantus insulin and we do not have samples.  I gave him a sample of Basaglar to use the same as Lantus - 30 units once daily until he is able to call about extra help.   Patient also asks that his metformin be increase back to 1000mg  bid.  He reports BG readings in the 200's (which is likely related to being out of Lantus) but will increase metformin as well as serum creatine was WNL when last checked. Patient advised to monitor of increase loose stool and to call if any problems.

## 2015-10-25 ENCOUNTER — Telehealth: Payer: Self-pay | Admitting: Family Medicine

## 2015-10-25 ENCOUNTER — Other Ambulatory Visit: Payer: Self-pay | Admitting: Family Medicine

## 2015-10-25 NOTE — Telephone Encounter (Signed)
aditional note in side B pool

## 2015-10-25 NOTE — Telephone Encounter (Signed)
Order sent in to [facility] per pte request

## 2015-11-01 DIAGNOSIS — H3582 Retinal ischemia: Secondary | ICD-10-CM | POA: Diagnosis not present

## 2015-11-01 DIAGNOSIS — E113513 Type 2 diabetes mellitus with proliferative diabetic retinopathy with macular edema, bilateral: Secondary | ICD-10-CM | POA: Diagnosis not present

## 2015-11-02 ENCOUNTER — Encounter: Payer: Self-pay | Admitting: Family Medicine

## 2015-11-02 ENCOUNTER — Ambulatory Visit (INDEPENDENT_AMBULATORY_CARE_PROVIDER_SITE_OTHER): Payer: PPO | Admitting: Family Medicine

## 2015-11-02 VITALS — BP 133/84 | HR 74 | Temp 98.2°F | Ht 66.0 in | Wt 182.0 lb

## 2015-11-02 DIAGNOSIS — I10 Essential (primary) hypertension: Secondary | ICD-10-CM

## 2015-11-02 DIAGNOSIS — E1165 Type 2 diabetes mellitus with hyperglycemia: Secondary | ICD-10-CM

## 2015-11-02 DIAGNOSIS — IMO0002 Reserved for concepts with insufficient information to code with codable children: Secondary | ICD-10-CM

## 2015-11-02 DIAGNOSIS — Z139 Encounter for screening, unspecified: Secondary | ICD-10-CM

## 2015-11-02 DIAGNOSIS — Z794 Long term (current) use of insulin: Secondary | ICD-10-CM | POA: Diagnosis not present

## 2015-11-02 LAB — POCT GLYCOSYLATED HEMOGLOBIN (HGB A1C): Hemoglobin A1C: 8.3

## 2015-11-02 MED ORDER — GLUCOSE BLOOD VI STRP: ORAL_STRIP | Status: DC

## 2015-11-02 NOTE — Progress Notes (Signed)
Subjective:  Patient ID: Ethan Peters, male    DOB: 27-Feb-1956  Age: 60 y.o. MRN: [id number]  CC: Diabetes; Hypertension; and Hyperlipidemia   HPI Ethan Peters presents for  follow-up of hypertension. Patient has no history of headache chest pain or shortness of breath or recent cough. Patient also denies symptoms of TIA such as numbness weakness lateralizing. Patient checks  blood pressure at home and has not had any elevated readings recently. Patient denies side effects from his medication. States taking it regularly.  Patient also  in for follow-up of elevated cholesterol. Doing well without complaints on current medication. Denies side effects of statin including myalgia and arthralgia and nausea. Also in today for liver function testing. Currently no chest pain, shortness of breath or other cardiovascular related symptoms noted.  Follow-up of diabetes. Patient does occasonally check blood sugar at home. Readings run over 200. Patient denies symptoms such as polyuria, polydipsia, excessive hunger, nausea No significant hypoglycemic spells noted. Medications as noted below. Taking them irregularly due to cost, but no adverse reaction being reported today. Each insulin type is $90/mo.   History Ethan Peters has a past medical history of Hypertension; Diabetes mellitus; CAD (coronary artery disease) (01/09/13); Ischemic cardiomyopathy; Myocardial infarction ([place]) (01/09/2013); Shortness of breath; and Gout.   He has past surgical history that includes Coronary angioplasty with stent (01/09/2013); Cardiac catheterization (01/22/2013); left heart catheterization with coronary angiogram (N/A, 01/09/2013); and left heart catheterization with coronary angiogram (N/A, 01/22/2013).   His family history includes Heart disease in his father and mother.He reports that he has never smoked. He has never used smokeless tobacco. He reports that he drinks alcohol. He reports that he does not use illicit  drugs.  Current Outpatient Prescriptions on File Prior to Visit  Medication Sig Dispense Refill  . allopurinol (ZYLOPRIM) 300 MG tablet TAKE ONE TABLET BY MOUTH ONCE DAILY AS NEEDED FOR GOUT PAIN 90 tablet 3  . aspirin EC 81 MG EC tablet Take 1 tablet (81 mg total) by mouth daily.    Marland Kitchen atorvastatin (LIPITOR) 80 MG tablet Take 1 tablet (80 mg total) by mouth daily at 6 PM. For cholesterol 90 tablet 3  . B Complex-C (B-COMPLEX WITH VITAMIN C) tablet Take 1 tablet by mouth as needed (for vitamin deficiency).    . clopidogrel (PLAVIX) 75 MG tablet Take 1 tablet (75 mg total) by mouth daily. 90 tablet 3  . fluticasone (FLONASE) 50 MCG/ACT nasal spray Place 2 sprays into both nostrils daily. 16 g 6  . glipiZIDE (GLUCOTROL) 5 MG tablet TAKE ONE TABLET BY MOUTH ONCE DAILY BEFORE BREAKFAST 30 tablet 1  . GLUCOSAMINE PO Take 1 capsule by mouth as needed.     . hydrochlorothiazide (HYDRODIURIL) 25 MG tablet Take 1 tablet (25 mg total) by mouth daily. 90 tablet 3  . insulin lispro (HUMALOG) 100 UNIT/ML injection Use 20 units before breakfast and lunch and 10 units before supper 10 mL 11  . Lancets (ACCU-CHEK MULTICLIX) lancets Use as instructed. Test QID, Delica 220 each 12  . metFORMIN (GLUCOPHAGE) 1000 MG tablet Take 1 tablet (1,000 mg total) by mouth 2 (two) times daily with a meal. 60 tablet 1  . metoprolol tartrate (LOPRESSOR) 25 MG tablet Take 0.5 tablets (12.5 mg total) by mouth 2 (two) times daily. 180 tablet 3  . nitroGLYCERIN (NITROSTAT) 0.4 MG SL tablet Place 1 tablet (0.4 mg total) under the tongue every 5 (five) minutes x 3 doses as needed for chest pain. 25  tablet 11  . sildenafil (VIAGRA) 50 MG tablet Take 1 tablet (50 mg total) by mouth daily as needed for erectile dysfunction. 10 tablet 5  . celecoxib (CELEBREX) 200 MG capsule Take 1 capsule (200 mg total) by mouth daily. With food (Patient not taking: Reported on 11/02/2015) 30 capsule 5  . gabapentin (NEURONTIN) 300 MG capsule Take 1  capsule (300 mg total) by mouth at bedtime. (Patient not taking: Reported on 11/02/2015) 30 capsule 2  . Insulin Glargine (BASAGLAR KWIKPEN) 100 UNIT/ML Solostar Pen Inject 30 Units into the skin daily. 3 mL 11  . lansoprazole (PREVACID) 30 MG capsule Take 1 capsule (30 mg total) by mouth daily at 12 noon. (Patient not taking: Reported on 11/02/2015) 30 capsule 5  . montelukast (SINGULAIR) 10 MG tablet Take 1 tablet (10 mg total) by mouth at bedtime. (Patient not taking: Reported on 11/02/2015) 30 tablet 5  . sertraline (ZOLOFT) 50 MG tablet Take 1 tablet (50 mg total) by mouth daily. (Patient not taking: Reported on 11/02/2015) 30 tablet 5   No current facility-administered medications on file prior to visit.    ROS Review of Systems  Constitutional: Negative for fever, chills, diaphoresis and unexpected weight change.  HENT: Negative for congestion, hearing loss, rhinorrhea and sore throat.   Eyes: Negative for visual disturbance.  Respiratory: Negative for cough and shortness of breath.   Cardiovascular: Negative for chest pain.  Gastrointestinal: Negative for abdominal pain, diarrhea and constipation.  Genitourinary: Negative for dysuria and flank pain.  Musculoskeletal: Negative for joint swelling and arthralgias.  Skin: Negative for rash.  Neurological: Negative for dizziness and headaches.  Psychiatric/Behavioral: Negative for sleep disturbance and dysphoric mood.    Objective:  BP 133/84 mmHg  Pulse 74  Temp(Src) 98.2 F (36.8 C) (Oral)  Ht '5\' 6"'  (1.676 m)  Wt 182 lb (82.555 kg)  BMI 29.39 kg/m2  SpO2 97%  BP Readings from Last 3 Encounters:  11/02/15 133/84  08/01/15 138/85  04/22/15 134/80    Wt Readings from Last 3 Encounters:  11/02/15 182 lb (82.555 kg)  08/01/15 181 lb 12.8 oz (82.464 kg)  04/22/15 185 lb 1.6 oz (83.961 kg)     Physical Exam  Constitutional: He is oriented to person, place, and time. He appears well-developed and well-nourished. No distress.    HENT:  Head: Normocephalic and atraumatic.  Right Ear: External ear normal.  Left Ear: External ear normal.  Nose: Nose normal.  Mouth/Throat: Oropharynx is clear and moist.  Eyes: Conjunctivae and EOM are normal. Pupils are equal, round, and reactive to light.  Neck: Normal range of motion. Neck supple. No thyromegaly present.  Cardiovascular: Normal rate, regular rhythm and normal heart sounds.   No murmur heard. Pulmonary/Chest: Effort normal and breath sounds normal. No respiratory distress. He has no wheezes. He has no rales.  Abdominal: Soft. Bowel sounds are normal. He exhibits no distension. There is no tenderness.  Lymphadenopathy:    He has no cervical adenopathy.  Neurological: He is alert and oriented to person, place, and time. He has normal reflexes.  Skin: Skin is warm and dry.  Psychiatric: He has a normal mood and affect. His behavior is normal. Judgment and thought content normal.    Lab Results  Component Value Date   HGBA1C 8.3 11/02/2015   HGBA1C 6.5 08/01/2015   HGBA1C 7.4 03/31/2015    Lab Results  Component Value Date   WBC 6.7 11/02/2015   HGB 12.6* 03/31/2015   HCT 39.0 11/02/2015  PLT 241 11/02/2015   GLUCOSE 131* 11/02/2015   CHOL 140 08/01/2015   TRIG 84 08/01/2015   HDL 41 08/01/2015   LDLCALC 82 08/01/2015   ALT 33 11/02/2015   AST 28 11/02/2015   NA 141 11/02/2015   K 4.7 11/02/2015   CL 100 11/02/2015   CREATININE 1.12 11/02/2015   BUN 18 11/02/2015   CO2 23 11/02/2015   TSH 5.120* 11/02/2015   PSA 0.6 09/22/2014   INR 1.16 01/21/2013   HGBA1C 8.3 11/02/2015    No results found.  Assessment & Plan:   Ethan Peters was seen today for diabetes, hypertension and hyperlipidemia.  Diagnoses and all orders for this visit:  Essential hypertension -     CBC with Differential/Platelet  Uncontrolled type 2 diabetes mellitus with insulin therapy (HCC) -     POCT glycosylated hemoglobin (Hb A1C) -     CMP14+EGFR -     Cancel:  Microalbumin / creatinine urine ratio  Screening -     PSA, total and free -     TSH  Other orders -     Discontinue: glucose blood (ONETOUCH VERIO) test strip; USE ONE STRIP TO CHECK GLUCOSE 4 TIMES DAILY   I have discontinued Ethan Peters's ALPRAZolam and ONETOUCH VERIO. I am also having him maintain his GLUCOSAMINE PO, aspirin, B-complex with vitamin C, gabapentin, hydrochlorothiazide, fluticasone, atorvastatin, clopidogrel, sildenafil, allopurinol, insulin lispro, accu-chek multiclix, metoprolol tartrate, montelukast, lansoprazole, nitroGLYCERIN, sertraline, celecoxib, glipiZIDE, metFORMIN, and Insulin Glargine.  Meds ordered this encounter  Medications  . DISCONTD: glucose blood (ONETOUCH VERIO) test strip    Sig: USE ONE STRIP TO CHECK GLUCOSE 4 TIMES DAILY    Dispense:  125 each    Refill:  11    E11.9   Resume daily Lantus. DC humalog  Follow-up: Return in about 2 weeks (around 11/16/2015) for diabetes.  Claretta Fraise, M.D.

## 2015-11-03 LAB — CBC WITH DIFFERENTIAL/PLATELET
BASOS: 1 %
Basophils Absolute: 0 10*3/uL (ref 0.0–0.2)
EOS (ABSOLUTE): 0.2 10*3/uL (ref 0.0–0.4)
EOS: 2 %
HEMATOCRIT: 39 % (ref 37.5–51.0)
HEMOGLOBIN: 13.1 g/dL (ref 12.6–17.7)
IMMATURE GRANS (ABS): 0 10*3/uL (ref 0.0–0.1)
Immature Granulocytes: 0 %
LYMPHS ABS: 2.7 10*3/uL (ref 0.7–3.1)
LYMPHS: 40 %
MCH: 30.8 pg (ref 26.6–33.0)
MCHC: 33.6 g/dL (ref 31.5–35.7)
MCV: 92 fL (ref 79–97)
MONOCYTES: 6 %
Monocytes Absolute: 0.4 10*3/uL (ref 0.1–0.9)
NEUTROS ABS: 3.5 10*3/uL (ref 1.4–7.0)
Neutrophils: 51 %
Platelets: 241 10*3/uL (ref 150–379)
RBC: 4.25 x10E6/uL (ref 4.14–5.80)
RDW: 13.5 % (ref 12.3–15.4)
WBC: 6.7 10*3/uL (ref 3.4–10.8)

## 2015-11-03 LAB — TSH: TSH: 5.12 u[IU]/mL — ABNORMAL HIGH (ref 0.450–4.500)

## 2015-11-03 LAB — CMP14+EGFR
ALBUMIN: 4.3 g/dL (ref 3.6–4.8)
ALK PHOS: 87 IU/L (ref 39–117)
ALT: 33 IU/L (ref 0–44)
AST: 28 IU/L (ref 0–40)
Albumin/Globulin Ratio: 1.5 (ref 1.1–2.5)
BILIRUBIN TOTAL: 0.5 mg/dL (ref 0.0–1.2)
BUN / CREAT RATIO: 16 (ref 10–22)
BUN: 18 mg/dL (ref 8–27)
CHLORIDE: 100 mmol/L (ref 96–106)
CO2: 23 mmol/L (ref 18–29)
Calcium: 8.8 mg/dL (ref 8.6–10.2)
Creatinine, Ser: 1.12 mg/dL (ref 0.76–1.27)
GFR calc non Af Amer: 71 mL/min/{1.73_m2} (ref >59)
GFR, EST AFRICAN AMERICAN: 82 mL/min/{1.73_m2} (ref >59)
GLOBULIN, TOTAL: 2.9 g/dL (ref 1.5–4.5)
Glucose: 131 mg/dL — ABNORMAL HIGH (ref 65–99)
Potassium: 4.7 mmol/L (ref 3.5–5.2)
SODIUM: 141 mmol/L (ref 134–144)
TOTAL PROTEIN: 7.2 g/dL (ref 6.0–8.5)

## 2015-11-03 LAB — PSA, TOTAL AND FREE
PROSTATE SPECIFIC AG, SERUM: 0.6 ng/mL (ref 0.0–4.0)
PSA, Free Pct: 28.3 %
PSA, Free: 0.17 ng/mL

## 2015-11-04 ENCOUNTER — Other Ambulatory Visit: Payer: Self-pay | Admitting: Family Medicine

## 2015-11-04 MED ORDER — LEVOTHYROXINE SODIUM 50 MCG PO TABS: 50.0000 ug | ORAL_TABLET | Freq: Every day | ORAL | Status: DC

## 2015-11-08 ENCOUNTER — Ambulatory Visit (INDEPENDENT_AMBULATORY_CARE_PROVIDER_SITE_OTHER): Payer: PPO | Admitting: Cardiology

## 2015-11-08 ENCOUNTER — Encounter: Payer: Self-pay | Admitting: Cardiology

## 2015-11-08 VITALS — BP 110/76 | HR 63 | Ht 66.0 in | Wt 186.2 lb

## 2015-11-08 DIAGNOSIS — I255 Ischemic cardiomyopathy: Secondary | ICD-10-CM

## 2015-11-08 DIAGNOSIS — I2510 Atherosclerotic heart disease of native coronary artery without angina pectoris: Secondary | ICD-10-CM | POA: Diagnosis not present

## 2015-11-08 DIAGNOSIS — I10 Essential (primary) hypertension: Secondary | ICD-10-CM

## 2015-11-08 NOTE — Progress Notes (Signed)
Ethan Peters Date of Birth: 03/27/1956 Medical Record #[id number]  History of Present Illness: Ethan Peters is seen for followup of CAD. He is status post inferior STEMI on 01/09/2013. He had stenting of the RCA at the crux. He had persistent chest pain and dyspnea even after his infarct with atypical symptoms. He subsequently underwent repeat cardiac catheterization on April 10,2014 which showed excellent patency of the stent in the RCA. He does have a long 70% stenosis in the left circumflex,  treated medically. He had a Myovew study in July 2014 which showed an inferior scar without ischemia. EF was normal.  He has a history of poorly controlled diabetes mellitus with retinopathy and neuropathy. He is now followed by Dr. Darlyn Read.  On follow up today he notes infrequent symptoms of chest pain.  He is still walking 15 minutes twice a day. He has noted some numbness in his left arm and left leg while watching TV. He notes BS running higher recently- states there was a mix up with pharmacy for his insulin.  Current Outpatient Prescriptions on File Prior to Visit  Medication Sig Dispense Refill  . allopurinol (ZYLOPRIM) 300 MG tablet TAKE ONE TABLET BY MOUTH ONCE DAILY AS NEEDED FOR GOUT PAIN 90 tablet 3  . aspirin EC 81 MG EC tablet Take 1 tablet (81 mg total) by mouth daily.    Marland Kitchen atorvastatin (LIPITOR) 80 MG tablet Take 1 tablet (80 mg total) by mouth daily at 6 PM. For cholesterol 90 tablet 3  . B Complex-C (B-COMPLEX WITH VITAMIN C) tablet Take 1 tablet by mouth as needed (for vitamin deficiency).    . celecoxib (CELEBREX) 200 MG capsule Take 1 capsule (200 mg total) by mouth daily. With food 30 capsule 5  . clopidogrel (PLAVIX) 75 MG tablet Take 1 tablet (75 mg total) by mouth daily. 90 tablet 3  . fluticasone (FLONASE) 50 MCG/ACT nasal spray Place 2 sprays into both nostrils daily. 16 g 6  . gabapentin (NEURONTIN) 300 MG capsule Take 1 capsule (300 mg total) by mouth at bedtime. 30 capsule 2   . glipiZIDE (GLUCOTROL) 5 MG tablet TAKE ONE TABLET BY MOUTH ONCE DAILY BEFORE BREAKFAST 30 tablet 1  . GLUCOSAMINE PO Take 1 capsule by mouth as needed.     . hydrochlorothiazide (HYDRODIURIL) 25 MG tablet Take 1 tablet (25 mg total) by mouth daily. 90 tablet 3  . Insulin Glargine (BASAGLAR KWIKPEN) 100 UNIT/ML Solostar Pen Inject 30 Units into the skin daily. 3 mL 11  . insulin lispro (HUMALOG) 100 UNIT/ML injection Use 20 units before breakfast and lunch and 10 units before supper 10 mL 11  . Lancets (ACCU-CHEK MULTICLIX) lancets Use as instructed. Test QID, Delica 120 each 12  . lansoprazole (PREVACID) 30 MG capsule Take 1 capsule (30 mg total) by mouth daily at 12 noon. 30 capsule 5  . levothyroxine (SYNTHROID, LEVOTHROID) 50 MCG tablet Take 1 tablet (50 mcg total) by mouth daily. 30 tablet 2  . metFORMIN (GLUCOPHAGE) 1000 MG tablet Take 1 tablet (1,000 mg total) by mouth 2 (two) times daily with a meal. 60 tablet 1  . metoprolol tartrate (LOPRESSOR) 25 MG tablet Take 0.5 tablets (12.5 mg total) by mouth 2 (two) times daily. 180 tablet 3  . montelukast (SINGULAIR) 10 MG tablet Take 1 tablet (10 mg total) by mouth at bedtime. 30 tablet 5  . nitroGLYCERIN (NITROSTAT) 0.4 MG SL tablet Place 1 tablet (0.4 mg total) under the tongue every 5 (five)  minutes x 3 doses as needed for chest pain. 25 tablet 11  . sertraline (ZOLOFT) 50 MG tablet Take 1 tablet (50 mg total) by mouth daily. 30 tablet 5  . sildenafil (VIAGRA) 50 MG tablet Take 1 tablet (50 mg total) by mouth daily as needed for erectile dysfunction. 10 tablet 5   No current facility-administered medications on file prior to visit.    Allergies  Allergen Reactions  . Levemir [Insulin Detemir] Swelling  . Penicillins Rash    Lip swelling    Past Medical History  Diagnosis Date  . Hypertension   . Diabetes mellitus     Uncontrolled, Hgb A1C 14.8% on 03/14, started on insulin  . CAD (coronary artery disease) 01/09/13    Inferior  STEMI s/p DES-RCA  . Ischemic cardiomyopathy     EF 45-50%, grade 1 diastolic dysfunction, mildly dilated RV, RA at the upper limits of normal, mild TR, PA systolic pressure 32 mm mercury and hypokinesis to akinesis of the basal mid inferior myocardium  . Myocardial infarction (HCC) 01/09/2013  . Shortness of breath   . Gout     Past Surgical History  Procedure Laterality Date  . Coronary angioplasty with stent placement  01/09/2013    30% pLAD, 30% mLAD, 70% pLCx, RCA occlusion at crux with R->L distal collaterals s/p DES; LVEF 50%, moderate-severe HK of inferior basal wall  . Cardiac catheterization  01/22/2013    Diffuse borderline residual CAD consistent with uncontrolled diabetes, medical management recommended  . Left heart catheterization with coronary angiogram N/A 01/09/2013    Procedure: LEFT HEART CATHETERIZATION WITH CORONARY ANGIOGRAM;  Surgeon: Janalyn Higby M [place], MD;  Location[facility] CATH LAB;  Service: Cardiovascular;  Laterality: N/A;  . Left heart catheterization with coronary angiogram N/A 01/22/2013    Procedure: LEFT HEART CATHETERIZATION WITH CORONARY ANGIOGRAM;  Surgeon: Tonny Bollman, MD;  Location: [facility] CATH LAB;  Service: Cardiovascular;  Laterality: N/A;    History  Smoking status  . Never Smoker   Smokeless tobacco  . Never Used    History  Alcohol Use  . Yes    Comment: i have not drank in a year "    Family History  Problem Relation Age of Onset  . Heart disease Mother   . Heart disease Father     Review of Systems: As noted in history of present illness.  All other systems were reviewed and are negative.  Physical Exam: BP 110/76 mmHg  Pulse 63  Ht  (1.676 m)  Wt 84.482 kg (186 lb 4 oz)  BMI 30.08 kg/m2 He is a pleasant Latino male in no acute distress. HEENT: Normal. Lungs: Clear Cardiovascular: Regular rate and rhythm without gallop, murmur, or click. He has no chest wall pain to palpation.  Abdomen: Soft and nontender. No masses or  bruits. Bowel sounds are positive. Extremities: No edema. Radial and pedal pulses are 2+. Skin: Warm and dry Neuro: Alert and oriented x3. Normal motor and sensory exam.  LABORATORY DATA: Lab Results  Component Value Date   WBC 6.7 11/02/2015   HGB 12.6* 03/31/2015   HCT 39.0 11/02/2015   PLT 241 11/02/2015   GLUCOSE 131* 11/02/2015   CHOL 140 08/01/2015   TRIG 84 08/01/2015   HDL 41 08/01/2015   LDLCALC 82 08/01/2015   ALT 33 11/02/2015   AST 28 11/02/2015   NA 141 11/02/2015   K 4.7 11/02/2015   CL 100 11/02/2015   CREATININE 1.12 11/02/2015   BUN 18  11/02/2015   CO2 23 11/02/2015   TSH 5.120* 11/02/2015   PSA 0.6 09/22/2014   INR 1.16 01/21/2013   HGBA1C 8.3 11/02/2015   Ecg today shows NSR with old inferior infarct. Otherwise normal. I have personally reviewed and interpreted this study.   Assessment / Plan: 1. Coronary disease status post inferior STEMI treated with DES to the RCA. Repeat cardiac catheterization in 2014 demonstrated continued patency. Moderate diffuse disease in the left circumflex. No ischemia by [facility] July 2014. We will continue aspirin and statin. He is currently asymptomatic. I will follow up in 6 months and will consider repeat stress testing later this year.   2. Diabetes mellitus: On metformin, glipizide, and insulin . Followed by primary care.  3. Dyslipidemia. On statin therapy. Recent lipids looked satisfactory.  4. Hypertension, BP well controlled.  On metoprolol and HCTZ. Will monitor.  5. Diabetic retinopathy and neuropathy.

## 2015-11-08 NOTE — Patient Instructions (Signed)
Continue your current therapy  I will see you in 6 months.   

## 2015-11-11 ENCOUNTER — Ambulatory Visit: Payer: PPO | Admitting: Family Medicine

## 2015-11-22 ENCOUNTER — Ambulatory Visit (INDEPENDENT_AMBULATORY_CARE_PROVIDER_SITE_OTHER): Payer: PPO | Admitting: Family Medicine

## 2015-11-22 ENCOUNTER — Encounter: Payer: Self-pay | Admitting: Family Medicine

## 2015-11-22 VITALS — BP 143/81 | HR 66 | Temp 97.9°F | Ht 66.0 in | Wt 184.0 lb

## 2015-11-22 DIAGNOSIS — E1165 Type 2 diabetes mellitus with hyperglycemia: Secondary | ICD-10-CM

## 2015-11-22 DIAGNOSIS — T383X5A Adverse effect of insulin and oral hypoglycemic [antidiabetic] drugs, initial encounter: Secondary | ICD-10-CM

## 2015-11-22 DIAGNOSIS — E039 Hypothyroidism, unspecified: Secondary | ICD-10-CM | POA: Diagnosis not present

## 2015-11-22 DIAGNOSIS — E160 Drug-induced hypoglycemia without coma: Secondary | ICD-10-CM

## 2015-11-22 DIAGNOSIS — Z794 Long term (current) use of insulin: Secondary | ICD-10-CM

## 2015-11-22 DIAGNOSIS — E113512 Type 2 diabetes mellitus with proliferative diabetic retinopathy with macular edema, left eye: Secondary | ICD-10-CM | POA: Diagnosis not present

## 2015-11-22 DIAGNOSIS — IMO0002 Reserved for concepts with insufficient information to code with codable children: Secondary | ICD-10-CM

## 2015-11-22 MED ORDER — INSULIN LISPRO 100 UNIT/ML ~~LOC~~ SOLN: SUBCUTANEOUS | Status: DC

## 2015-11-22 NOTE — Patient Instructions (Signed)
Add a bedtime snack with a small amount of carbohydrate to help prevent overnight low glucose. Two possibilities are a cup of greek yogurt with fruit in it or 2 graham cracker sheets

## 2015-11-22 NOTE — Progress Notes (Signed)
Subjective:  Patient ID: Ethan Peters, male    DOB: 10-Mar-1956  Age: 60 y.o. MRN: [id number]  CC: Diabetes   HPI Ethan Peters presents forFollow-up of diabetes. Patient checks blood sugar at home.  70-130  fasting and 160- 180 postprandial Patient denies symptoms such as polyuria, polydipsia, excessive hunger, nausea Has had 3 significant night time lows. Did not decrease suppertime novolog because glucose went too high at 10 units only.  Medications as noted below. Taking them regularly without complication/adverse reaction being reported today. Checking feet daily.  History Ethan Peters has a past medical history of Hypertension; Diabetes mellitus; CAD (coronary artery disease) (01/09/13); Ischemic cardiomyopathy; Myocardial infarction ([place]) (01/09/2013); Shortness of breath; and Gout.   He has past surgical history that includes Coronary angioplasty with stent (01/09/2013); Cardiac catheterization (01/22/2013); left heart catheterization with coronary angiogram (N/A, 01/09/2013); and left heart catheterization with coronary angiogram (N/A, 01/22/2013).   His family history includes Heart disease in his father and mother.He reports that he has never smoked. He has never used smokeless tobacco. He reports that he drinks alcohol. He reports that he does not use illicit drugs.  Current Outpatient Prescriptions on File Prior to Visit  Medication Sig Dispense Refill  . allopurinol (ZYLOPRIM) 300 MG tablet TAKE ONE TABLET BY MOUTH ONCE DAILY AS NEEDED FOR GOUT PAIN 90 tablet 3  . aspirin EC 81 MG EC tablet Take 1 tablet (81 mg total) by mouth daily.    Marland Kitchen atorvastatin (LIPITOR) 80 MG tablet Take 1 tablet (80 mg total) by mouth daily at 6 PM. For cholesterol 90 tablet 3  . B Complex-C (B-COMPLEX WITH VITAMIN C) tablet Take 1 tablet by mouth as needed (for vitamin deficiency).    . celecoxib (CELEBREX) 200 MG capsule Take 1 capsule (200 mg total) by mouth daily. With food 30 capsule 5  .  clopidogrel (PLAVIX) 75 MG tablet Take 1 tablet (75 mg total) by mouth daily. 90 tablet 3  . fluticasone (FLONASE) 50 MCG/ACT nasal spray Place 2 sprays into both nostrils daily. 16 g 6  . gabapentin (NEURONTIN) 300 MG capsule Take 1 capsule (300 mg total) by mouth at bedtime. 30 capsule 2  . glipiZIDE (GLUCOTROL) 5 MG tablet TAKE ONE TABLET BY MOUTH ONCE DAILY BEFORE BREAKFAST 30 tablet 1  . GLUCOSAMINE PO Take 1 capsule by mouth as needed.     . hydrochlorothiazide (HYDRODIURIL) 25 MG tablet Take 1 tablet (25 mg total) by mouth daily. 90 tablet 3  . Insulin Glargine (BASAGLAR KWIKPEN) 100 UNIT/ML Solostar Pen Inject 30 Units into the skin daily. 3 mL 11  . Lancets (ACCU-CHEK MULTICLIX) lancets Use as instructed. Test QID, Delica 503 each 12  . lansoprazole (PREVACID) 30 MG capsule Take 1 capsule (30 mg total) by mouth daily at 12 noon. 30 capsule 5  . levothyroxine (SYNTHROID, LEVOTHROID) 50 MCG tablet Take 1 tablet (50 mcg total) by mouth daily. 30 tablet 2  . metFORMIN (GLUCOPHAGE) 1000 MG tablet Take 1 tablet (1,000 mg total) by mouth 2 (two) times daily with a meal. 60 tablet 1  . metoprolol tartrate (LOPRESSOR) 25 MG tablet Take 0.5 tablets (12.5 mg total) by mouth 2 (two) times daily. 180 tablet 3  . montelukast (SINGULAIR) 10 MG tablet Take 1 tablet (10 mg total) by mouth at bedtime. 30 tablet 5  . nitroGLYCERIN (NITROSTAT) 0.4 MG SL tablet Place 1 tablet (0.4 mg total) under the tongue every 5 (five) minutes x 3 doses as needed for  chest pain. 25 tablet 11  . sertraline (ZOLOFT) 50 MG tablet Take 1 tablet (50 mg total) by mouth daily. 30 tablet 5  . sildenafil (VIAGRA) 50 MG tablet Take 1 tablet (50 mg total) by mouth daily as needed for erectile dysfunction. 10 tablet 5   No current facility-administered medications on file prior to visit.    ROS Review of Systems  Constitutional: Negative for fever, chills, diaphoresis and unexpected weight change.  HENT: Negative for congestion,  hearing loss, rhinorrhea and sore throat.   Eyes: Negative for visual disturbance.  Respiratory: Negative for cough and shortness of breath.   Cardiovascular: Negative for chest pain.  Gastrointestinal: Negative for abdominal pain, diarrhea and constipation.  Genitourinary: Negative for dysuria and flank pain.  Musculoskeletal: Negative for joint swelling and arthralgias.  Skin: Negative for rash.  Neurological: Negative for dizziness and headaches.  Psychiatric/Behavioral: Negative for sleep disturbance and dysphoric mood.    Objective:  BP 143/81 mmHg  Pulse 66  Temp(Src) 97.9 F (36.6 C) (Oral)  Ht 5' 6" (1.676 m)  Wt 184 lb (83.462 kg)  BMI 29.71 kg/m2  SpO2 98%  BP Readings from Last 3 Encounters:  11/22/15 143/81  11/08/15 110/76  11/02/15 133/84    Wt Readings from Last 3 Encounters:  11/22/15 184 lb (83.462 kg)  11/08/15 186 lb 4 oz (84.482 kg)  11/02/15 182 lb (82.555 kg)     Physical Exam  Constitutional: He is oriented to person, place, and time. He appears well-developed and well-nourished. No distress.  HENT:  Head: Normocephalic and atraumatic.  Right Ear: External ear normal.  Left Ear: External ear normal.  Nose: Nose normal.  Mouth/Throat: Oropharynx is clear and moist.  Eyes: Conjunctivae and EOM are normal. Pupils are equal, round, and reactive to light.  Neck: Normal range of motion. Neck supple. No thyromegaly present.  Cardiovascular: Normal rate, regular rhythm and normal heart sounds.   No murmur heard. Pulmonary/Chest: Effort normal and breath sounds normal. No respiratory distress. He has no wheezes. He has no rales.  Abdominal: Soft. Bowel sounds are normal. He exhibits no distension. There is no tenderness.  Lymphadenopathy:    He has no cervical adenopathy.  Neurological: He is alert and oriented to person, place, and time. He has normal reflexes.  Skin: Skin is warm and dry.  Psychiatric: He has a normal mood and affect. His behavior  is normal. Judgment and thought content normal.    Lab Results  Component Value Date   HGBA1C 8.3 11/02/2015   HGBA1C 6.5 08/01/2015   HGBA1C 7.4 03/31/2015    Lab Results  Component Value Date   WBC 6.7 11/02/2015   HGB 12.6* 03/31/2015   HCT 39.0 11/02/2015   PLT 241 11/02/2015   GLUCOSE 131* 11/02/2015   CHOL 140 08/01/2015   TRIG 84 08/01/2015   HDL 41 08/01/2015   LDLCALC 82 08/01/2015   ALT 33 11/02/2015   AST 28 11/02/2015   NA 141 11/02/2015   K 4.7 11/02/2015   CL 100 11/02/2015   CREATININE 1.12 11/02/2015   BUN 18 11/02/2015   CO2 23 11/02/2015   TSH 5.120* 11/02/2015   PSA 0.6 09/22/2014   INR 1.16 01/21/2013   HGBA1C 8.3 11/02/2015     Assessment & Plan:   Lenord was seen today for diabetes.  Diagnoses and all orders for this visit:  Long term current use of insulin ([place]) -     POCT glycosylated hemoglobin (Hb A1C); Standing -  CMP14+EGFR; Standing -     Lipid panel; Standing  Uncontrolled type 2 diabetes mellitus with insulin therapy (HCC) -     POCT glycosylated hemoglobin (Hb A1C); Standing -     CMP14+EGFR; Standing -     Lipid panel; Standing  Hypoglycemia due to insulin -     POCT glycosylated hemoglobin (Hb A1C); Standing -     CMP14+EGFR; Standing -     Lipid panel; Standing  Hypothyroidism, unspecified hypothyroidism type -     CMP14+EGFR; Standing -     TSH + free T4; Standing  Other orders -     insulin lispro (HUMALOG) 100 UNIT/ML injection; Use 20 units before breakfast and lunch and 15 units before supper    Add a bedtime snack with a small amount of carbohydrate to help prevent overnight low glucose. Two possibilities are a cup of greek yogurt with fruit in it or 2 graham cracker sheets  I have changed Ethan Peters's insulin lispro. I am also having him maintain his GLUCOSAMINE PO, aspirin, B-complex with vitamin C, gabapentin, hydrochlorothiazide, fluticasone, atorvastatin, clopidogrel, sildenafil, allopurinol,  accu-chek multiclix, metoprolol tartrate, montelukast, lansoprazole, nitroGLYCERIN, sertraline, celecoxib, glipiZIDE, metFORMIN, Insulin Glargine, and levothyroxine.  Meds ordered this encounter  Medications  . insulin lispro (HUMALOG) 100 UNIT/ML injection    Sig: Use 20 units before breakfast and lunch and 15 units before supper    Dispense:  10 mL    Refill:  11     Follow-up: Return in about 3 months (around 02/19/2016) for diabetes, Hypothyroidism.  Claretta Fraise, M.D.

## 2015-11-28 ENCOUNTER — Telehealth: Payer: Self-pay

## 2015-11-28 NOTE — Telephone Encounter (Signed)
Insurance prior authorized Humalog 100 units /ml vial through 10/14/16

## 2015-11-29 ENCOUNTER — Other Ambulatory Visit: Payer: Self-pay | Admitting: Family Medicine

## 2015-12-13 DIAGNOSIS — E113512 Type 2 diabetes mellitus with proliferative diabetic retinopathy with macular edema, left eye: Secondary | ICD-10-CM | POA: Diagnosis not present

## 2015-12-13 DIAGNOSIS — E113591 Type 2 diabetes mellitus with proliferative diabetic retinopathy without macular edema, right eye: Secondary | ICD-10-CM | POA: Diagnosis not present

## 2015-12-13 DIAGNOSIS — H43813 Vitreous degeneration, bilateral: Secondary | ICD-10-CM | POA: Diagnosis not present

## 2015-12-31 ENCOUNTER — Other Ambulatory Visit: Payer: Self-pay | Admitting: Family Medicine

## 2015-12-31 ENCOUNTER — Other Ambulatory Visit: Payer: Self-pay | Admitting: Pharmacist

## 2016-01-02 ENCOUNTER — Other Ambulatory Visit: Payer: Self-pay | Admitting: Family Medicine

## 2016-01-02 MED ORDER — METFORMIN HCL 1000 MG PO TABS: 1000.0000 mg | ORAL_TABLET | Freq: Two times a day (BID) | ORAL | Status: DC

## 2016-01-02 NOTE — Telephone Encounter (Signed)
done

## 2016-01-23 DIAGNOSIS — E113591 Type 2 diabetes mellitus with proliferative diabetic retinopathy without macular edema, right eye: Secondary | ICD-10-CM | POA: Diagnosis not present

## 2016-01-23 DIAGNOSIS — H35372 Puckering of macula, left eye: Secondary | ICD-10-CM | POA: Diagnosis not present

## 2016-01-23 DIAGNOSIS — E113512 Type 2 diabetes mellitus with proliferative diabetic retinopathy with macular edema, left eye: Secondary | ICD-10-CM | POA: Diagnosis not present

## 2016-01-23 DIAGNOSIS — H3582 Retinal ischemia: Secondary | ICD-10-CM | POA: Diagnosis not present

## 2016-01-26 ENCOUNTER — Other Ambulatory Visit: Payer: Self-pay | Admitting: Family Medicine

## 2016-01-30 ENCOUNTER — Telehealth: Payer: Self-pay | Admitting: Family Medicine

## 2016-02-29 ENCOUNTER — Other Ambulatory Visit: Payer: Self-pay | Admitting: Family Medicine

## 2016-02-29 ENCOUNTER — Telehealth: Payer: Self-pay | Admitting: Family Medicine

## 2016-02-29 MED ORDER — INSULIN ASPART 100 UNIT/ML ~~LOC~~ SOLN: SUBCUTANEOUS | Status: DC

## 2016-02-29 NOTE — Telephone Encounter (Signed)
Okay to change. Same sig, etc.. Thanks ws

## 2016-02-29 NOTE — Telephone Encounter (Signed)
Patient aware and rx sent to pharmacy.  

## 2016-03-25 ENCOUNTER — Other Ambulatory Visit: Payer: Self-pay | Admitting: Family Medicine

## 2016-03-27 DIAGNOSIS — E113591 Type 2 diabetes mellitus with proliferative diabetic retinopathy without macular edema, right eye: Secondary | ICD-10-CM | POA: Diagnosis not present

## 2016-03-27 DIAGNOSIS — E113512 Type 2 diabetes mellitus with proliferative diabetic retinopathy with macular edema, left eye: Secondary | ICD-10-CM | POA: Diagnosis not present

## 2016-03-27 DIAGNOSIS — H43813 Vitreous degeneration, bilateral: Secondary | ICD-10-CM | POA: Diagnosis not present

## 2016-03-27 DIAGNOSIS — H3582 Retinal ischemia: Secondary | ICD-10-CM | POA: Diagnosis not present

## 2016-04-03 ENCOUNTER — Ambulatory Visit: Payer: PPO | Admitting: Family Medicine

## 2016-04-05 ENCOUNTER — Ambulatory Visit: Payer: PPO | Admitting: Family Medicine

## 2016-04-06 ENCOUNTER — Encounter: Payer: Self-pay | Admitting: Family Medicine

## 2016-04-11 ENCOUNTER — Encounter: Payer: Self-pay | Admitting: Family Medicine

## 2016-04-11 ENCOUNTER — Ambulatory Visit (INDEPENDENT_AMBULATORY_CARE_PROVIDER_SITE_OTHER): Payer: PPO | Admitting: Family Medicine

## 2016-04-11 VITALS — BP 116/78 | HR 74 | Temp 97.8°F | Wt 177.2 lb

## 2016-04-11 DIAGNOSIS — Z794 Long term (current) use of insulin: Secondary | ICD-10-CM | POA: Diagnosis not present

## 2016-04-11 DIAGNOSIS — E1342 Other specified diabetes mellitus with diabetic polyneuropathy: Secondary | ICD-10-CM

## 2016-04-11 DIAGNOSIS — I10 Essential (primary) hypertension: Secondary | ICD-10-CM

## 2016-04-11 LAB — BAYER DCA HB A1C WAIVED: HB A1C: 8.1 % — AB (ref <7.0)

## 2016-04-11 MED ORDER — GABAPENTIN 300 MG PO CAPS: 300.0000 mg | ORAL_CAPSULE | Freq: Two times a day (BID) | ORAL | Status: DC

## 2016-04-11 MED ORDER — LISINOPRIL 5 MG PO TABS: 5.0000 mg | ORAL_TABLET | Freq: Every day | ORAL | Status: DC

## 2016-04-11 NOTE — Addendum Note (Signed)
Addended by: Fawn Kirk on: 04/11/2016 04:00 PM   Modules accepted: Orders, SmartSet

## 2016-04-11 NOTE — Progress Notes (Signed)
Subjective:    Patient ID: Ethan Peters, male    DOB: 21-Jan-1956, 60 y.o.   MRN: [id number]  HPI 60 year old gentleman who comes in and follow-up diabetes. He also has some hypertension coronary artery disease and diabetic neuropathy. He does describe some pain in his feet regarding the latter but he takes gabapentin only when necessary. We discussed that it might work better to take it on a more regular scheduled basis at least twice a day and is willing to do that. We spent a fair amount of time discussing his blood sugar test he brings in a very thorough records where he's checking 4 times a day for the past 4 months. Generally his sugars of been between 102 150. His there is some concern that there are several low numbers represented here including ione of 42.  Patient Active Problem List   Diagnosis Date Noted  . Long term current use of insulin (HCC) 02/22/2015  . Diabetic neuropathy (HCC) 02/22/2015  . Rotator cuff arthropathy 02/22/2015  . CAD (coronary artery disease), native coronary artery 01/14/2013  . Cardiomyopathy, ischemic 01/14/2013  . HTN (hypertension) 01/09/2013   Outpatient Encounter Prescriptions as of 04/11/2016  Medication Sig  . allopurinol (ZYLOPRIM) 300 MG tablet TAKE ONE TABLET BY MOUTH ONCE DAILY AS NEEDED FOR GOUT PAIN  . aspirin EC 81 MG EC tablet Take 1 tablet (81 mg total) by mouth daily.  Marland Kitchen atorvastatin (LIPITOR) 80 MG tablet Take 1 tablet (80 mg total) by mouth daily at 6 PM. For cholesterol  . B Complex-C (B-COMPLEX WITH VITAMIN C) tablet Take 1 tablet by mouth as needed (for vitamin deficiency).  . celecoxib (CELEBREX) 200 MG capsule Take 1 capsule (200 mg total) by mouth daily. With food  . clopidogrel (PLAVIX) 75 MG tablet Take 1 tablet (75 mg total) by mouth daily.  . fluticasone (FLONASE) 50 MCG/ACT nasal spray Place 2 sprays into both nostrils daily.  Marland Kitchen gabapentin (NEURONTIN) 300 MG capsule Take 1 capsule (300 mg total) by mouth at bedtime.  Marland Kitchen  glipiZIDE (GLUCOTROL) 5 MG tablet TAKE ONE TABLET BY MOUTH ONCE DAILY BEFORE BREAKFAST  . GLUCOSAMINE PO Take 1 capsule by mouth as needed.   . hydrochlorothiazide (HYDRODIURIL) 25 MG tablet Take 1 tablet (25 mg total) by mouth daily.  . insulin aspart (NOVOLOG) 100 UNIT/ML injection 20 units before breakfast and lunch and 15 units before supper  . Insulin Glargine (BASAGLAR KWIKPEN) 100 UNIT/ML Solostar Pen Inject 30 Units into the skin daily.  . insulin lispro (HUMALOG) 100 UNIT/ML injection Use 20 units before breakfast and lunch and 15 units before supper  . Lancets (ACCU-CHEK MULTICLIX) lancets Use as instructed. Test QID, Delica  . lansoprazole (PREVACID) 30 MG capsule Take 1 capsule (30 mg total) by mouth daily at 12 noon.  Marland Kitchen LANTUS 100 UNIT/ML injection   . levothyroxine (SYNTHROID, LEVOTHROID) 50 MCG tablet TAKE ONE TABLET BY MOUTH ONCE DAILY  . metFORMIN (GLUCOPHAGE) 1000 MG tablet TAKE ONE TABLET BY MOUTH TWICE DAILY WITH  A  MEAL  . metoprolol tartrate (LOPRESSOR) 25 MG tablet Take 0.5 tablets (12.5 mg total) by mouth 2 (two) times daily.  . montelukast (SINGULAIR) 10 MG tablet Take 1 tablet (10 mg total) by mouth at bedtime.  . nitroGLYCERIN (NITROSTAT) 0.4 MG SL tablet Place 1 tablet (0.4 mg total) under the tongue every 5 (five) minutes x 3 doses as needed for chest pain.  Marland Kitchen NOVOLOG 100 UNIT/ML injection INJECT 20 UNITS BEFORE BREAKFAST AND  LUNCH AND 10 UNITS BEFORE SUPPER  . sertraline (ZOLOFT) 50 MG tablet Take 1 tablet (50 mg total) by mouth daily.  . sildenafil (VIAGRA) 50 MG tablet Take 1 tablet (50 mg total) by mouth daily as needed for erectile dysfunction.   No facility-administered encounter medications on file as of 04/11/2016.      Review of Systems  Constitutional: Negative.   HENT: Negative.   Respiratory: Negative.   Cardiovascular: Negative.   Gastrointestinal: Negative.   Genitourinary: Negative.   Neurological: Negative.        Objective:   Physical  Exam  Constitutional: He is oriented to person, place, and time. He appears well-developed and well-nourished.  HENT:  Head: Normocephalic.  Cardiovascular: Normal rate, regular rhythm and intact distal pulses.   Pulmonary/Chest: Effort normal and breath sounds normal.  Neurological: He is alert and oriented to person, place, and time.  Psychiatric: He has a normal mood and affect. His behavior is normal.   BP 116/78 mmHg  Pulse 74  Temp(Src) 97.8 F (36.6 C) (Oral)  Wt 177 lb 3.2 oz (80.377 kg)        Assessment & Plan:  1. Essential hypertension Blood pressure is good today at 116/78 but I note that he is only on metoprolol and not an Ace or an arb for renal protection. He is agreeable to going back on lisinopril 5 mg. Apparently he had been on that previously  2. Diabetic polyneuropathy associated with other specified diabetes mellitus (HCC) I  Frederica Kuster MDncrease gabapentin to at least 300 mg twice a day scheduled. To take twice a day rather than when necessary  3. Long term current use of insulin (HCC) Continue with Lantus and NovoLog. He felt like his numbers are worse on Humalog and has gone back to NovoLog. We discussed how long-acting insulin works. He had been dividing doses of Lantus at 15 units in a.m. and p.m. I explained that it should work the same if he just takes it once a day and I think he will do that 4.

## 2016-04-25 ENCOUNTER — Other Ambulatory Visit: Payer: Self-pay | Admitting: Family Medicine

## 2016-04-26 DIAGNOSIS — H3581 Retinal edema: Secondary | ICD-10-CM | POA: Diagnosis not present

## 2016-04-26 DIAGNOSIS — E113512 Type 2 diabetes mellitus with proliferative diabetic retinopathy with macular edema, left eye: Secondary | ICD-10-CM | POA: Diagnosis not present

## 2016-04-26 DIAGNOSIS — H4312 Vitreous hemorrhage, left eye: Secondary | ICD-10-CM | POA: Diagnosis not present

## 2016-04-26 DIAGNOSIS — H35372 Puckering of macula, left eye: Secondary | ICD-10-CM | POA: Diagnosis not present

## 2016-04-26 DIAGNOSIS — H35352 Cystoid macular degeneration, left eye: Secondary | ICD-10-CM | POA: Diagnosis not present

## 2016-05-04 DIAGNOSIS — H35372 Puckering of macula, left eye: Secondary | ICD-10-CM | POA: Diagnosis not present

## 2016-05-04 DIAGNOSIS — E113512 Type 2 diabetes mellitus with proliferative diabetic retinopathy with macular edema, left eye: Secondary | ICD-10-CM | POA: Diagnosis not present

## 2016-05-04 DIAGNOSIS — Z48810 Encounter for surgical aftercare following surgery on the sense organs: Secondary | ICD-10-CM | POA: Diagnosis not present

## 2016-05-25 ENCOUNTER — Other Ambulatory Visit: Payer: Self-pay | Admitting: Family Medicine

## 2016-05-25 DIAGNOSIS — E113512 Type 2 diabetes mellitus with proliferative diabetic retinopathy with macular edema, left eye: Secondary | ICD-10-CM | POA: Diagnosis not present

## 2016-05-25 DIAGNOSIS — H35372 Puckering of macula, left eye: Secondary | ICD-10-CM | POA: Diagnosis not present

## 2016-06-06 ENCOUNTER — Encounter: Payer: Self-pay | Admitting: *Deleted

## 2016-06-19 NOTE — Progress Notes (Addendum)
Ethan Peters Date of Birth: [birth year] Medical Record #[id number]  History of Present Illness: Ethan Peters is seen for followup of CAD. He is status post inferior STEMI on 01/09/2013. He had stenting of the RCA at the crux. He had persistent chest pain and dyspnea even after his infarct with atypical symptoms. He subsequently underwent repeat cardiac catheterization on April 10,2014 which showed excellent patency of the stent in the RCA. He does have a long 70% stenosis in the left circumflex,  treated medically. He had a Myovew study in July 2014 which showed an inferior scar without ischemia. EF was normal.  He has a history of poorly controlled diabetes mellitus with retinopathy and neuropathy. He is now followed by Dr. Darlyn Peters.  On follow up today he notes infrequent symptoms of chest pain.  He is still walking 15 minutes twice a day. He has noted some numbness in his left arm. These are chronic complaints.  He notes BS were running higher but have done better in the last month. He reports having eye surgery this year.  Current Outpatient Prescriptions on File Prior to Visit  Medication Sig Dispense Refill  . allopurinol (ZYLOPRIM) 300 MG tablet TAKE ONE TABLET BY MOUTH ONCE DAILY AS NEEDED FOR GOUT PAIN 90 tablet 0  . aspirin EC 81 MG EC tablet Take 1 tablet (81 mg total) by mouth daily.    Marland Kitchen atorvastatin (LIPITOR) 80 MG tablet TAKE ONE TABLET BY MOUTH ONCE DAILY AT 6PM FOR  CHOLESTEROL 30 tablet 2  . B Complex-C (B-COMPLEX WITH VITAMIN C) tablet Take 1 tablet by mouth as needed (for vitamin deficiency).    . celecoxib (CELEBREX) 200 MG capsule Take 1 capsule (200 mg total) by mouth daily. With food 30 capsule 5  . fluticasone (FLONASE) 50 MCG/ACT nasal spray Place 2 sprays into both nostrils daily. 16 g 6  . gabapentin (NEURONTIN) 300 MG capsule Take 1 capsule (300 mg total) by mouth 2 (two) times daily. 60 capsule 2  . glipiZIDE (GLUCOTROL) 5 MG tablet TAKE ONE TABLET BY MOUTH ONCE DAILY  BEFORE BREAKFAST 30 tablet 2  . insulin aspart (NOVOLOG) 100 UNIT/ML injection 20 units before breakfast and lunch and 15 units before supper 3 vial PRN  . Insulin Glargine (BASAGLAR KWIKPEN) 100 UNIT/ML Solostar Pen Inject 30 Units into the skin daily. 3 mL 11  . insulin lispro (HUMALOG) 100 UNIT/ML injection Use 20 units before breakfast and lunch and 15 units before supper 10 mL 11  . Lancets (ACCU-CHEK MULTICLIX) lancets Use as instructed. Test QID, Delica 120 each 12  . lansoprazole (PREVACID) 30 MG capsule Take 1 capsule (30 mg total) by mouth daily at 12 noon. 30 capsule 5  . LANTUS 100 UNIT/ML injection     . levothyroxine (SYNTHROID, LEVOTHROID) 50 MCG tablet TAKE ONE TABLET BY MOUTH ONCE DAILY 30 tablet 9  . lisinopril (PRINIVIL,ZESTRIL) 5 MG tablet Take 1 tablet (5 mg total) by mouth daily. 90 tablet 2  . metFORMIN (GLUCOPHAGE) 1000 MG tablet TAKE ONE TABLET BY MOUTH TWICE DAILY WITH  A  MEAL 60 tablet 1  . metoprolol tartrate (LOPRESSOR) 25 MG tablet Take 0.5 tablets (12.5 mg total) by mouth 2 (two) times daily. 180 tablet 3  . nitroGLYCERIN (NITROSTAT) 0.4 MG SL tablet Place 1 tablet (0.4 mg total) under the tongue every 5 (five) minutes x 3 doses as needed for chest pain. 25 tablet 11  . NOVOLOG 100 UNIT/ML injection INJECT 20 UNITS BEFORE BREAKFAST AND  LUNCH AND 10 UNITS BEFORE SUPPER 10 mL 0  . NOVOLOG 100 UNIT/ML injection INJECT 20 UNITS BEFORE BREAKFAST AND LUNCH AND 10 UNITS BEFORE SUPPER 10 mL 2   No current facility-administered medications on file prior to visit.     Allergies  Allergen Reactions  . Levemir [Insulin Detemir] Swelling  . Penicillins Rash    Lip swelling    Past Medical History:  Diagnosis Date  . CAD (coronary artery disease) 01/09/13   Inferior STEMI s/p DES-RCA  . Diabetes mellitus    Uncontrolled, Hgb A1C 14.8% on 03/14, started on insulin  . Gout   . Hypertension   . Ischemic cardiomyopathy    EF 45-50%, grade 1 diastolic dysfunction,  mildly dilated RV, RA at the upper limits of normal, mild TR, PA systolic pressure 32 mm mercury and hypokinesis to akinesis of the basal mid inferior myocardium  . Myocardial infarction (HCC) 01/09/2013  . Shortness of breath     Past Surgical History:  Procedure Laterality Date  . CARDIAC CATHETERIZATION  01/22/2013   Diffuse borderline residual CAD consistent with uncontrolled diabetes, medical management recommended  . CORONARY ANGIOPLASTY WITH STENT PLACEMENT  01/09/2013   30% pLAD, 30% mLAD, 70% pLCx, RCA occlusion at crux with R->L distal collaterals s/p DES; LVEF 50%, moderate-severe HK of inferior basal wall  . LEFT HEART CATHETERIZATION WITH CORONARY ANGIOGRAM N/A 01/09/2013   Procedure: LEFT HEART CATHETERIZATION WITH CORONARY ANGIOGRAM;  Surgeon: Ethan Hagerty M [place], MD;  [facility];  Service: Cardiovascular;  Laterality: N/A;  . LEFT HEART CATHETERIZATION WITH CORONARY ANGIOGRAM N/A 01/22/2013   Procedure: LEFT HEART CATHETERIZATION WITH CORONARY ANGIOGRAM;  Surgeon: Ethan BollmanMichael Cooper, MD;  [facility];  Service: Cardiovascular;  Laterality: N/A;    History  Smoking Status  . Never Smoker  Smokeless Tobacco  . Never Used    History  Alcohol Use  . Yes    Comment: i have not drank in a year "    Family History  Problem Relation Age of Onset  . Heart disease Mother   . Heart disease Father     Review of Systems: As noted in history of present illness.  All other systems were reviewed and are negative.  Physical Exam: BP 112/78   Pulse 62   Ht 5\' 6"  (1.676 m)   Wt 182 lb (82.6 kg)   BMI 29.38 kg/m  He is a pleasant Latino male in no acute distress. HEENT: Normal. Lungs: Clear Cardiovascular: Regular rate and rhythm without gallop, murmur, or click. He has no chest wall pain to palpation.  Abdomen: Soft and nontender. No masses or bruits. Bowel sounds are positive. Extremities: No edema. Radial and pedal pulses are 2+. Skin: Warm and dry Neuro:  Alert and oriented x3. Normal motor and sensory exam.  LABORATORY DATA: Lab Results  Component Value Date   WBC 6.7 11/02/2015   HGB 12.6 (A) 03/31/2015   HCT 39.0 11/02/2015   PLT 241 11/02/2015   GLUCOSE 131 (H) 11/02/2015   CHOL 140 08/01/2015   TRIG 84 08/01/2015   HDL 41 08/01/2015   LDLCALC 82 08/01/2015   ALT 33 11/02/2015   AST 28 11/02/2015   NA 141 11/02/2015   K 4.7 11/02/2015   CL 100 11/02/2015   CREATININE 1.12 11/02/2015   BUN 18 11/02/2015   CO2 23 11/02/2015   TSH 5.120 (H) 11/02/2015   PSA 0.6 09/22/2014   INR 1.16 01/21/2013   HGBA1C 8.3 11/02/2015  Last A1c 8.1% on 04/10/16    Assessment / Plan: 1. Coronary disease status post inferior STEMI treated with DES to the RCA. Repeat cardiac catheterization in 2014 demonstrated continued patency. Moderate diffuse disease in the left circumflex. No ischemia by [facility] July 2014. We will continue aspirin and statin. He can stop Plavix now. He has chronic atypical chest pain. We will arrange a follow up stress Myoview.   2. Diabetes mellitus: On metformin, glipizide, and insulin . Followed by primary care. Last A1c 8.1%.  3. Dyslipidemia. On statin therapy. Lipids have not been checked in one year. Will update fasting labs.  4. Hypertension, BP well controlled.  On metoprolol and HCTZ. Will monitor.  5. Diabetic retinopathy and neuropathy.  6. Elevated TSH on last labs. Will repeat TSH and free T4.  Follow up in 6 months.

## 2016-06-20 ENCOUNTER — Encounter: Payer: Self-pay | Admitting: Cardiology

## 2016-06-20 ENCOUNTER — Ambulatory Visit (INDEPENDENT_AMBULATORY_CARE_PROVIDER_SITE_OTHER): Payer: PPO | Admitting: Cardiology

## 2016-06-20 VITALS — BP 112/78 | HR 62 | Ht 66.0 in | Wt 182.0 lb

## 2016-06-20 DIAGNOSIS — I10 Essential (primary) hypertension: Secondary | ICD-10-CM | POA: Diagnosis not present

## 2016-06-20 DIAGNOSIS — I2510 Atherosclerotic heart disease of native coronary artery without angina pectoris: Secondary | ICD-10-CM | POA: Diagnosis not present

## 2016-06-20 DIAGNOSIS — E785 Hyperlipidemia, unspecified: Secondary | ICD-10-CM

## 2016-06-20 DIAGNOSIS — E0840 Diabetes mellitus due to underlying condition with diabetic neuropathy, unspecified: Secondary | ICD-10-CM | POA: Diagnosis not present

## 2016-06-20 DIAGNOSIS — E782 Mixed hyperlipidemia: Secondary | ICD-10-CM | POA: Insufficient documentation

## 2016-06-20 LAB — TSH: TSH: 1.41 mIU/L (ref 0.40–4.50)

## 2016-06-20 LAB — BASIC METABOLIC PANEL
BUN: 14 mg/dL (ref 7–25)
CHLORIDE: 103 mmol/L (ref 98–110)
CO2: 25 mmol/L (ref 20–31)
CREATININE: 1.19 mg/dL (ref 0.70–1.25)
Calcium: 9.3 mg/dL (ref 8.6–10.3)
Glucose, Bld: 154 mg/dL — ABNORMAL HIGH (ref 65–99)
Potassium: 4.5 mmol/L (ref 3.5–5.3)
Sodium: 139 mmol/L (ref 135–146)

## 2016-06-20 LAB — LIPID PANEL
Cholesterol: 120 mg/dL — ABNORMAL LOW (ref 125–200)
HDL: 41 mg/dL (ref >=40)
LDL CALC: 59 mg/dL (ref <130)
Total CHOL/HDL Ratio: 2.9 Ratio (ref <=5.0)
Triglycerides: 99 mg/dL (ref <150)
VLDL: 20 mg/dL (ref <30)

## 2016-06-20 LAB — HEPATIC FUNCTION PANEL
ALBUMIN: 4.6 g/dL (ref 3.6–5.1)
ALK PHOS: 68 U/L (ref 40–115)
ALT: 9 U/L (ref 9–46)
AST: 13 U/L (ref 10–35)
BILIRUBIN TOTAL: 0.6 mg/dL (ref 0.2–1.2)
Bilirubin, Direct: 0.1 mg/dL (ref <=0.2)
Indirect Bilirubin: 0.5 mg/dL (ref 0.2–1.2)
Total Protein: 7.7 g/dL (ref 6.1–8.1)

## 2016-06-20 LAB — T4, FREE: FREE T4: 1.3 ng/dL (ref 0.8–1.8)

## 2016-06-20 NOTE — Patient Instructions (Signed)
Stop taking clopidogrel  We will check blood work and schedule you for a nuclear stress test

## 2016-06-21 ENCOUNTER — Telehealth (HOSPITAL_COMMUNITY): Payer: Self-pay

## 2016-06-21 NOTE — Telephone Encounter (Signed)
Encounter complete. 

## 2016-06-26 ENCOUNTER — Ambulatory Visit (HOSPITAL_COMMUNITY)
Admission: RE | Admit: 2016-06-26 | Discharge: 2016-06-26 | Disposition: A | Discharge status: Home / Self Care | Payer: PPO | Source: Ambulatory Visit | Attending: Cardiovascular Disease | Admitting: Cardiovascular Disease

## 2016-06-26 DIAGNOSIS — R9439 Abnormal result of other cardiovascular function study: Secondary | ICD-10-CM | POA: Insufficient documentation

## 2016-06-26 DIAGNOSIS — I10 Essential (primary) hypertension: Secondary | ICD-10-CM | POA: Diagnosis not present

## 2016-06-26 DIAGNOSIS — I2510 Atherosclerotic heart disease of native coronary artery without angina pectoris: Secondary | ICD-10-CM | POA: Diagnosis not present

## 2016-06-26 DIAGNOSIS — E785 Hyperlipidemia, unspecified: Secondary | ICD-10-CM | POA: Insufficient documentation

## 2016-06-26 DIAGNOSIS — E0840 Diabetes mellitus due to underlying condition with diabetic neuropathy, unspecified: Secondary | ICD-10-CM | POA: Diagnosis not present

## 2016-06-26 LAB — MYOCARDIAL PERFUSION IMAGING
CHL CUP NUCLEAR SRS: 8
CHL CUP RESTING HR STRESS: 53 {beats}/min
CSEPEDS: 15 s
CSEPHR: 92 %
CSEPPHR: 148 {beats}/min
Estimated workload: 10.8 METS
Exercise duration (min): 10 min
LV sys vol: 49 mL
LVDIAVOL: 94 mL (ref 62–150)
MPHR: 160 {beats}/min
RPE: 17
SDS: 3
SSS: 11
TID: 0.97

## 2016-06-26 MED ORDER — TECHNETIUM TC 99M TETROFOSMIN IV KIT
10.7000 | PACK | Freq: Once | INTRAVENOUS | Status: AC | PRN
  Administered 2016-06-26: 11 via INTRAVENOUS
  Filled 2016-06-26: qty 11

## 2016-06-26 MED ORDER — TECHNETIUM TC 99M TETROFOSMIN IV KIT
32.6000 | PACK | Freq: Once | INTRAVENOUS | Status: AC | PRN
  Administered 2016-06-26: 33 via INTRAVENOUS
  Filled 2016-06-26: qty 33

## 2016-07-15 ENCOUNTER — Other Ambulatory Visit: Payer: Self-pay | Admitting: Family Medicine

## 2016-07-27 ENCOUNTER — Ambulatory Visit (INDEPENDENT_AMBULATORY_CARE_PROVIDER_SITE_OTHER): Payer: PPO

## 2016-07-27 DIAGNOSIS — Z23 Encounter for immunization: Secondary | ICD-10-CM | POA: Diagnosis not present

## 2016-07-30 ENCOUNTER — Ambulatory Visit: Payer: PPO | Admitting: Family Medicine

## 2016-07-31 ENCOUNTER — Encounter: Payer: Self-pay | Admitting: Family Medicine

## 2016-07-31 ENCOUNTER — Telehealth: Payer: Self-pay | Admitting: *Deleted

## 2016-07-31 ENCOUNTER — Ambulatory Visit (INDEPENDENT_AMBULATORY_CARE_PROVIDER_SITE_OTHER): Payer: PPO | Admitting: Family Medicine

## 2016-07-31 VITALS — BP 125/76 | HR 57 | Temp 98.1°F | Ht 66.0 in | Wt 190.0 lb

## 2016-07-31 DIAGNOSIS — I10 Essential (primary) hypertension: Secondary | ICD-10-CM

## 2016-07-31 DIAGNOSIS — E1342 Other specified diabetes mellitus with diabetic polyneuropathy: Secondary | ICD-10-CM | POA: Diagnosis not present

## 2016-07-31 DIAGNOSIS — E782 Mixed hyperlipidemia: Secondary | ICD-10-CM | POA: Diagnosis not present

## 2016-07-31 LAB — BAYER DCA HB A1C WAIVED: HB A1C (BAYER DCA - WAIVED): 7.4 % — ABNORMAL HIGH (ref <7.0)

## 2016-07-31 MED ORDER — INSULIN NPH (HUMAN) (ISOPHANE) 100 UNIT/ML ~~LOC~~ SUSP: SUBCUTANEOUS | 11 refills | Status: DC

## 2016-07-31 NOTE — Progress Notes (Signed)
Subjective:  Patient ID: Ethan Peters, male    DOB: 15-Mar-1956  Age: 60 y.o. MRN: [id number]  CC: Diabetes (pt here today following up for chronic medical problems and states he hasn't been taking insulin due to his insurance not covering it.)   HPI Ethan Peters presents for out of insulin due to Lack of insurance coverage.. Log attached. Shows 4 times a day checking the sugar running anywhere from 132 276 before breakfast 150-200 before lunch and supper and close to that but sometimes lower at bedtime. Occasionally his blood sugar at bedtimes dropping to a little bit too low last night it was 64. Patient denies significant lows. He is confused about his insulin from pharmacy. Checking coverage it appears that his insurance should cover [place] has a past medical history of CAD (coronary artery disease) (01/09/13); Diabetes mellitus; Gout; Hypertension; Ischemic cardiomyopathy; Myocardial infarction (01/09/2013); and Shortness of breath.   He has a past surgical history that includes Coronary angioplasty with stent (01/09/2013); Cardiac catheterization (01/22/2013); left heart catheterization with coronary angiogram (N/A, 01/09/2013); and left heart catheterization with coronary angiogram (N/A, 01/22/2013).   His family history includes Heart disease in his father and mother.He reports that he has never smoked. He has never used smokeless tobacco. He reports that he drinks alcohol. He reports that he does not use drugs.    ROS Review of Systems  Constitutional: Negative for chills, diaphoresis, fever and unexpected weight change.  HENT: Negative for congestion, hearing loss, rhinorrhea and sore throat.   Eyes: Negative for visual disturbance.  Respiratory: Negative for cough and shortness of breath.   Cardiovascular: Negative for chest pain.  Gastrointestinal: Negative for abdominal pain, constipation and diarrhea.  Genitourinary: Negative for dysuria and flank  pain.  Musculoskeletal: Negative for arthralgias and joint swelling.  Skin: Negative for rash.  Neurological: Negative for dizziness and headaches.  Psychiatric/Behavioral: Negative for dysphoric mood and sleep disturbance.    Objective:  BP 125/76   Pulse (!) 57   Temp 98.1 F (36.7 C) (Oral)   Ht '5\' 6"'  (1.676 m)   Wt 190 lb (86.2 kg)   BMI 30.67 kg/m   BP Readings from Last 3 Encounters:  07/31/16 125/76  06/20/16 112/78  04/11/16 116/78    Wt Readings from Last 3 Encounters:  07/31/16 190 lb (86.2 kg)  06/26/16 182 lb (82.6 kg)  06/20/16 182 lb (82.6 kg)     Physical Exam  Constitutional: He appears well-developed and well-nourished.  HENT:  Head: Normocephalic and atraumatic.  Right Ear: Tympanic membrane and external ear normal. No decreased hearing is noted.  Left Ear: Tympanic membrane and external ear normal. No decreased hearing is noted.  Mouth/Throat: No oropharyngeal exudate or posterior oropharyngeal erythema.  Eyes: Pupils are equal, round, and reactive to light.  Neck: Normal range of motion. Neck supple.  Cardiovascular: Normal rate and regular rhythm.   No murmur heard. Pulmonary/Chest: Breath sounds normal. No respiratory distress.  Abdominal: Soft. Bowel sounds are normal. He exhibits no mass. There is no tenderness.  Vitals reviewed.    Lab Results  Component Value Date   WBC 6.7 11/02/2015   HGB 12.6 (A) 03/31/2015   HCT 39.0 11/02/2015   PLT 241 11/02/2015   GLUCOSE 154 (H) 06/20/2016   CHOL 120 (L) 06/20/2016   TRIG 99 06/20/2016   HDL 41 06/20/2016   LDLCALC 59 06/20/2016   ALT 9 06/20/2016   AST 13 06/20/2016  NA 139 06/20/2016   K 4.5 06/20/2016   CL 103 06/20/2016   CREATININE 1.19 06/20/2016   BUN 14 06/20/2016   CO2 25 06/20/2016   TSH 1.41 06/20/2016   PSA 0.6 09/22/2014   INR 1.16 01/21/2013   HGBA1C 8.3 11/02/2015   MICROALBUR neg 11/02/2014    No results found.  Assessment & Plan:   Ethan Peters was seen today  for diabetes.  Diagnoses and all orders for this visit:  Diabetic polyneuropathy associated with other specified diabetes mellitus ([place]) -     Bayer DCA Hb A1c Waived -     CMP14+EGFR -     Lipid panel -     Microalbumin / creatinine urine ratio  Essential hypertension -     CMP14+EGFR  Mixed hyperlipidemia -     CMP14+EGFR -     Lipid panel  Other orders -     insulin NPH Human (NOVOLIN N) 100 UNIT/ML injection; Take 40 units before breakfast and supper.    Patient having trouble with his insurance covering insulins. Her having to take him off of his long-acting glargine and his mealtime and using just twice a day Novolin for cost purposes. He'll continue his glipizide and metformin for now.  I have discontinued Ethan Peters's fluticasone, accu-chek multiclix, metoprolol tartrate, celecoxib, Insulin Glargine, insulin lispro, NOVOLOG, insulin aspart, LANTUS, and NOVOLOG. I am also having him start on insulin NPH Human. Additionally, I am having him maintain his aspirin, B-complex with vitamin C, lansoprazole, nitroGLYCERIN, levothyroxine, lisinopril, gabapentin, glipiZIDE, atorvastatin, allopurinol, and metFORMIN.  Meds ordered this encounter  Medications  . insulin NPH Human (NOVOLIN N) 100 UNIT/ML injection    Sig: Take 40 units before breakfast and supper.    Dispense:  30 mL    Refill:  11     Follow-up: Return in about 3 months (around 10/31/2016).  Claretta Fraise, M.D.

## 2016-07-31 NOTE — Telephone Encounter (Signed)
Patient aware of lab results.

## 2016-08-01 LAB — CMP14+EGFR
A/G RATIO: 1.5 (ref 1.2–2.2)
ALBUMIN: 4.8 g/dL (ref 3.6–4.8)
ALK PHOS: 81 IU/L (ref 39–117)
ALT: 13 IU/L (ref 0–44)
AST: 11 IU/L (ref 0–40)
BILIRUBIN TOTAL: 0.3 mg/dL (ref 0.0–1.2)
BUN / CREAT RATIO: 30 — AB (ref 10–24)
BUN: 29 mg/dL — ABNORMAL HIGH (ref 8–27)
CHLORIDE: 99 mmol/L (ref 96–106)
CO2: 25 mmol/L (ref 18–29)
Calcium: 10.1 mg/dL (ref 8.6–10.2)
Creatinine, Ser: 0.98 mg/dL (ref 0.76–1.27)
GFR calc Af Amer: 96 mL/min/{1.73_m2} (ref >59)
GFR calc non Af Amer: 83 mL/min/{1.73_m2} (ref >59)
GLOBULIN, TOTAL: 3.1 g/dL (ref 1.5–4.5)
Glucose: 187 mg/dL — ABNORMAL HIGH (ref 65–99)
POTASSIUM: 5.9 mmol/L — AB (ref 3.5–5.2)
SODIUM: 140 mmol/L (ref 134–144)
Total Protein: 7.9 g/dL (ref 6.0–8.5)

## 2016-08-01 LAB — LIPID PANEL
CHOLESTEROL TOTAL: 178 mg/dL (ref 100–199)
Chol/HDL Ratio: 3.6 ratio units (ref 0.0–5.0)
HDL: 50 mg/dL (ref >39)
LDL CALC: 105 mg/dL — AB (ref 0–99)
TRIGLYCERIDES: 113 mg/dL (ref 0–149)
VLDL Cholesterol Cal: 23 mg/dL (ref 5–40)

## 2016-08-01 LAB — MICROALBUMIN / CREATININE URINE RATIO
Creatinine, Urine: 58.6 mg/dL
MICROALB/CREAT RATIO: 8.2 mg/g{creat} (ref 0.0–30.0)
MICROALBUM., U, RANDOM: 4.8 ug/mL

## 2016-08-14 ENCOUNTER — Other Ambulatory Visit: Payer: Self-pay | Admitting: Family Medicine

## 2016-10-25 ENCOUNTER — Other Ambulatory Visit: Payer: Self-pay | Admitting: Family Medicine

## 2016-11-08 ENCOUNTER — Ambulatory Visit (INDEPENDENT_AMBULATORY_CARE_PROVIDER_SITE_OTHER): Payer: PPO | Admitting: Family Medicine

## 2016-11-08 ENCOUNTER — Encounter: Payer: Self-pay | Admitting: Family Medicine

## 2016-11-08 VITALS — BP 127/75 | HR 75 | Temp 98.4°F | Ht 66.0 in | Wt 192.0 lb

## 2016-11-08 DIAGNOSIS — J200 Acute bronchitis due to Mycoplasma pneumoniae: Secondary | ICD-10-CM | POA: Diagnosis not present

## 2016-11-08 DIAGNOSIS — R05 Cough: Secondary | ICD-10-CM | POA: Diagnosis not present

## 2016-11-08 DIAGNOSIS — R059 Cough, unspecified: Secondary | ICD-10-CM

## 2016-11-08 MED ORDER — LEVOFLOXACIN 500 MG PO TABS: 500.0000 mg | ORAL_TABLET | Freq: Every day | ORAL | 0 refills | Status: DC

## 2016-11-08 MED ORDER — BETAMETHASONE SOD PHOS & ACET 6 (3-3) MG/ML IJ SUSP
6.0000 mg | Freq: Once | INTRAMUSCULAR | Status: AC
  Administered 2016-11-08: 6 mg via INTRAMUSCULAR

## 2016-11-08 NOTE — Progress Notes (Signed)
Subjective:  Patient ID: Ethan Peters, male    DOB: Apr 10, 1956  Age: 61 y.o. MRN: [id number]  CC: Cough (pt here today c/o cough for about a month and it's not getting better)   HPI DANEN NICANOR presents for Patient presents with upper respiratory congestion. Rhinorrhea that is frequently purulent. There is moderate sore throat. Patient reports coughing frequently as well.-colored/purulent sputum noted. There is no fever no chills no sweats. The patient denies being short of breath. Onset was  3 -4 weeks ago. Gradually worsening  History [place] has a past medical history of CAD (coronary artery disease) (01/09/13); Diabetes mellitus; Gout; Hypertension; Ischemic cardiomyopathy; Myocardial infarction (01/09/2013); and Shortness of breath.   He has a past surgical history that includes Coronary angioplasty with stent (01/09/2013); Cardiac catheterization (01/22/2013); left heart catheterization with coronary angiogram (N/A, 01/09/2013); and left heart catheterization with coronary angiogram (N/A, 01/22/2013).   His family history includes Heart disease in his father and mother.He reports that he has never smoked. He has never used smokeless tobacco. He reports that he drinks alcohol. He reports that he does not use drugs.    ROS Review of Systems  Constitutional: Negative for activity change, appetite change, chills and fever.  HENT: Positive for congestion, postnasal drip, rhinorrhea and sinus pressure. Negative for ear discharge, ear pain, hearing loss, nosebleeds, sneezing and trouble swallowing.   Respiratory: Negative for chest tightness and shortness of breath.   Cardiovascular: Negative for chest pain and palpitations.  Skin: Negative for rash.    Objective:  BP 127/75   Pulse 75   Temp 98.4 F (36.9 C) (Oral)   Ht 5\' 6"  (1.676 m)   Wt 192 lb (87.1 kg)   BMI 30.99 kg/m   Physical Exam  Constitutional: He appears well-developed and well-nourished.  HENT:  Head:  Normocephalic and atraumatic.  Right Ear: Tympanic membrane and external ear normal. No decreased hearing is noted.  Left Ear: Tympanic membrane and external ear normal. No decreased hearing is noted.  Nose: Mucosal edema present. Right sinus exhibits no frontal sinus tenderness. Left sinus exhibits no frontal sinus tenderness.  Mouth/Throat: No oropharyngeal exudate or posterior oropharyngeal erythema.  Neck: No Brudzinski's sign noted.  Pulmonary/Chest: Breath sounds normal. No respiratory distress.  Lymphadenopathy:       Head (right side): No preauricular adenopathy present.       Head (left side): No preauricular adenopathy present.       Right cervical: No superficial cervical adenopathy present.      Left cervical: No superficial cervical adenopathy present.    Assessment & Plan:   Ilyaas was seen today for cough.  Diagnoses and all orders for this visit:  Cough -     betamethasone acetate-betamethasone sodium phosphate (CELESTONE) injection 6 mg; Inject 1 mL (6 mg total) into the muscle once.  Acute bronchitis due to Mycoplasma pneumoniae -     betamethasone acetate-betamethasone sodium phosphate (CELESTONE) injection 6 mg; Inject 1 mL (6 mg total) into the muscle once.  Other orders -     levofloxacin (LEVAQUIN) 500 MG tablet; Take 1 tablet (500 mg total) by mouth daily. For 10 days   I have discontinued Mr. Sellick's lansoprazole and lisinopril. I am also having him start on levofloxacin. Additionally, I am having him maintain his aspirin, B-complex with vitamin C, nitroGLYCERIN, levothyroxine, gabapentin, glipiZIDE, allopurinol, metFORMIN, insulin NPH Human, atorvastatin, metoprolol tartrate, and ONETOUCH VERIO. We administered betamethasone acetate-betamethasone sodium phosphate.  Meds ordered this encounter  Medications  . levofloxacin (LEVAQUIN) 500 MG tablet    Sig: Take 1 tablet (500 mg total) by mouth daily. For 10 days    Dispense:  10 tablet    Refill:  0  .  betamethasone acetate-betamethasone sodium phosphate (CELESTONE) injection 6 mg     Follow-up: No Follow-up on file.  Mechele Claude, M.D.

## 2016-11-20 ENCOUNTER — Other Ambulatory Visit: Payer: Self-pay | Admitting: Family Medicine

## 2016-11-23 DIAGNOSIS — E113512 Type 2 diabetes mellitus with proliferative diabetic retinopathy with macular edema, left eye: Secondary | ICD-10-CM | POA: Diagnosis not present

## 2016-11-23 DIAGNOSIS — E113591 Type 2 diabetes mellitus with proliferative diabetic retinopathy without macular edema, right eye: Secondary | ICD-10-CM | POA: Diagnosis not present

## 2016-11-27 ENCOUNTER — Ambulatory Visit (INDEPENDENT_AMBULATORY_CARE_PROVIDER_SITE_OTHER): Payer: PPO | Admitting: Family Medicine

## 2016-11-27 ENCOUNTER — Encounter: Payer: Self-pay | Admitting: Family Medicine

## 2016-11-27 VITALS — BP 135/81 | HR 68 | Temp 97.8°F | Ht 66.0 in | Wt 199.0 lb

## 2016-11-27 DIAGNOSIS — E1342 Other specified diabetes mellitus with diabetic polyneuropathy: Secondary | ICD-10-CM | POA: Diagnosis not present

## 2016-11-27 DIAGNOSIS — Z23 Encounter for immunization: Secondary | ICD-10-CM | POA: Diagnosis not present

## 2016-11-27 DIAGNOSIS — E782 Mixed hyperlipidemia: Secondary | ICD-10-CM

## 2016-11-27 DIAGNOSIS — I10 Essential (primary) hypertension: Secondary | ICD-10-CM

## 2016-11-27 DIAGNOSIS — Z794 Long term (current) use of insulin: Secondary | ICD-10-CM | POA: Diagnosis not present

## 2016-11-27 LAB — URINALYSIS
Bilirubin, UA: NEGATIVE
Ketones, UA: NEGATIVE
LEUKOCYTES UA: NEGATIVE
Nitrite, UA: NEGATIVE
PH UA: 5.5 (ref 5.0–7.5)
Protein, UA: NEGATIVE
RBC, UA: NEGATIVE
Specific Gravity, UA: 1.015 (ref 1.005–1.030)
Urobilinogen, Ur: 0.2 mg/dL (ref 0.2–1.0)

## 2016-11-27 LAB — BAYER DCA HB A1C WAIVED: HB A1C: 8.3 % — AB (ref <7.0)

## 2016-11-27 NOTE — Progress Notes (Signed)
Subjective:  Patient ID: Ethan Peters, male    DOB: [birth year]  Age: 61 y.o. MRN: [id number]  CC: Diabetes (pt here today for routine follow up on his diabetes)   HPI Ethan Peters presents for  follow-up of hypertension. Patient has no history of headache chest pain or shortness of breath or recent cough. Patient also denies symptoms of TIA such as numbness weakness lateralizing. Patient checks  blood pressure at home and has not had any elevated readings recently. Patient denies side effects from his medication. States taking it regularly.  Patient also  in for follow-up of elevated cholesterol. Doing well without complaints on current medication. Denies side effects of statin including myalgia and arthralgia and nausea. Also in today for liver function testing. Currently no chest pain, shortness of breath or other cardiovascular related symptoms noted.  Follow-up of diabetes. Patient does check blood sugar at home. Readings run between120 and 180 usually. Had two early AM lows 49 & 71. Each responded to eating small snack. Also at Christmas time had 4 episodes of glucose over 300 due to eating holiday foods. Patient denies symptoms such as polyuria, polydipsia, excessive hunger, nausea Medications as noted . Using Novolin 40 units AC, also metformin and glipizide. Stopped lisinopril due to cost.Willing to restart   History [place] has a past medical history of CAD (coronary artery disease) (01/09/13); Diabetes mellitus; Gout; Hypertension; Ischemic cardiomyopathy; Myocardial infarction (01/09/2013); and Shortness of breath.   He has a past surgical history that includes Coronary angioplasty with stent (01/09/2013); Cardiac catheterization (01/22/2013); left heart catheterization with coronary angiogram (N/A, 01/09/2013); and left heart catheterization with coronary angiogram (N/A, 01/22/2013).   His family history includes Heart disease in his father and mother.He reports that he has  never smoked. He has never used smokeless tobacco. He reports that he drinks alcohol. He reports that he does not use drugs.  Current Outpatient Prescriptions on File Prior to Visit  Medication Sig Dispense Refill  . allopurinol (ZYLOPRIM) 300 MG tablet TAKE ONE TABLET BY MOUTH ONCE DAILY AS NEEDED FOR GOUT PAIN 90 tablet 0  . aspirin EC 81 MG EC tablet Take 1 tablet (81 mg total) by mouth daily.    Marland Kitchen atorvastatin (LIPITOR) 80 MG tablet TAKE ONE TABLET BY MOUTH ONCE DAILY AT 6PM FOR CHOLESTEROL 30 tablet 1  . B Complex-C (B-COMPLEX WITH VITAMIN C) tablet Take 1 tablet by mouth as needed (for vitamin deficiency).    . gabapentin (NEURONTIN) 300 MG capsule Take 1 capsule (300 mg total) by mouth 2 (two) times daily. 60 capsule 2  . glipiZIDE (GLUCOTROL) 5 MG tablet TAKE ONE TABLET BY MOUTH ONCE DAILY BEFORE BREAKFAST 30 tablet 2  . insulin NPH Human (NOVOLIN N) 100 UNIT/ML injection Take 40 units before breakfast and supper. 30 mL 11  . levothyroxine (SYNTHROID, LEVOTHROID) 50 MCG tablet TAKE ONE TABLET BY MOUTH ONCE DAILY 30 tablet 9  . metFORMIN (GLUCOPHAGE) 1000 MG tablet TAKE ONE TABLET BY MOUTH TWICE DAILY WITH MEALS 60 tablet 0  . metoprolol tartrate (LOPRESSOR) 25 MG tablet TAKE ONE-HALF TABLET BY MOUTH TWICE DAILY 90 tablet 0  . nitroGLYCERIN (NITROSTAT) 0.4 MG SL tablet Place 1 tablet (0.4 mg total) under the tongue every 5 (five) minutes x 3 doses as needed for chest pain. 25 tablet 11  . ONETOUCH VERIO test strip USE TO CHECK GLUCOSE 4 TIMES DAILY 125 each 2   No current facility-administered medications on file prior to visit.  ROS Review of Systems  Constitutional: Negative for chills, diaphoresis, fever and unexpected weight change.  HENT: Negative for congestion, hearing loss, rhinorrhea and sore throat.   Eyes: Negative for visual disturbance.  Respiratory: Negative for cough and shortness of breath.   Cardiovascular: Negative for chest pain.  Gastrointestinal: Negative for  abdominal pain, constipation and diarrhea.  Genitourinary: Negative for dysuria and flank pain.  Musculoskeletal: Negative for arthralgias and joint swelling.  Skin: Negative for rash.  Neurological: Negative for dizziness and headaches.  Psychiatric/Behavioral: Negative for dysphoric mood and sleep disturbance.    Objective:  BP 135/81   Pulse 68   Temp 97.8 F (36.6 C) (Oral)   Ht '5\' 6"'  (1.676 m)   Wt 199 lb (90.3 kg)   BMI 32.12 kg/m   BP Readings from Last 3 Encounters:  11/27/16 135/81  11/08/16 127/75  07/31/16 125/76    Wt Readings from Last 3 Encounters:  11/27/16 199 lb (90.3 kg)  11/08/16 192 lb (87.1 kg)  07/31/16 190 lb (86.2 kg)     Physical Exam  Constitutional: He is oriented to person, place, and time. He appears well-developed and well-nourished. No distress.  HENT:  Head: Normocephalic and atraumatic.  Right Ear: External ear normal.  Left Ear: External ear normal.  Nose: Nose normal.  Mouth/Throat: Oropharynx is clear and moist.  Eyes: Conjunctivae and EOM are normal. Pupils are equal, round, and reactive to light.  Neck: Normal range of motion. Neck supple. No thyromegaly present.  Cardiovascular: Normal rate, regular rhythm and normal heart sounds.   No murmur heard. Pulmonary/Chest: Effort normal and breath sounds normal. No respiratory distress. He has no wheezes. He has no rales.  Abdominal: Soft. Bowel sounds are normal. He exhibits no distension. There is no tenderness.  Lymphadenopathy:    He has no cervical adenopathy.  Neurological: He is alert and oriented to person, place, and time. He has normal reflexes.  Skin: Skin is warm and dry.  Psychiatric: He has a normal mood and affect. His behavior is normal. Judgment and thought content normal.   Diabetic Foot Exam - Simple   Simple Foot Form Diabetic Foot exam was performed with the following findings:  Yes 11/27/2016  9:28 AM  Visual Inspection No deformities, no ulcerations, no other  skin breakdown bilaterally:  Yes Sensation Testing Intact to touch and monofilament testing bilaterally:  Yes See comments:  Yes Pulse Check See comments:  Yes Comments Pt. Unable to detect monofilament at plantar surface left toe 2-4  Left posterior tibialis not detectable. Right is 1+. DP intact bilaterally.       Lab Results  Component Value Date   WBC 7.7 11/27/2016   HGB 12.6 (A) 03/31/2015   HCT 38.3 11/27/2016   PLT 225 11/27/2016   GLUCOSE 123 (H) 11/27/2016   CHOL 128 11/27/2016   TRIG 125 11/27/2016   HDL 43 11/27/2016   LDLCALC 60 11/27/2016   ALT 30 11/27/2016   AST 32 11/27/2016   NA 141 11/27/2016   K 4.0 11/27/2016   CL 100 11/27/2016   CREATININE 0.94 11/27/2016   BUN 12 11/27/2016   CO2 22 11/27/2016   TSH 1.41 06/20/2016   PSA 0.6 09/22/2014   INR 1.16 01/21/2013   HGBA1C 8.3 11/02/2015   MICROALBUR neg 11/02/2014       Assessment & Plan:   Krithik was seen today for diabetes.  Diagnoses and all orders for this visit:  Mixed hyperlipidemia -  Cancel: CBC with Differential/Platelet -     CMP14+EGFR -     Lipid panel  Diabetic polyneuropathy associated with other specified diabetes mellitus ([place]) -     Cancel: CBC with Differential/Platelet -     CMP14+EGFR -     Bayer DCA Hb A1c Waived -     Microalbumin / creatinine urine ratio -     Urinalysis -     Lipid panel  Essential hypertension -     Cancel: CBC with Differential/Platelet -     CMP14+EGFR  Long term current use of insulin ([facility]) -     Cancel: CBC with Differential/Platelet -     CMP14+EGFR  Other orders -     Pneumococcal conjugate vaccine 13-valent -     CBC with Differential/Platelet   I have discontinued Mr. Tryon's levofloxacin. I am also having him maintain his aspirin, B-complex with vitamin C, nitroGLYCERIN, levothyroxine, gabapentin, glipiZIDE, allopurinol, metFORMIN, insulin NPH Human, atorvastatin, metoprolol tartrate, and ONETOUCH VERIO.  No orders of  the defined types were placed in this encounter.    Follow-up: Return in about 3 months (around 02/24/2017).  Claretta Fraise, M.D.

## 2016-11-28 LAB — CMP14+EGFR
ALBUMIN: 4.3 g/dL (ref 3.6–4.8)
ALK PHOS: 82 IU/L (ref 39–117)
ALT: 30 IU/L (ref 0–44)
AST: 32 IU/L (ref 0–40)
Albumin/Globulin Ratio: 1.5 (ref 1.2–2.2)
BUN / CREAT RATIO: 13 (ref 10–24)
BUN: 12 mg/dL (ref 8–27)
Bilirubin Total: 0.3 mg/dL (ref 0.0–1.2)
CO2: 22 mmol/L (ref 18–29)
CREATININE: 0.94 mg/dL (ref 0.76–1.27)
Calcium: 8.7 mg/dL (ref 8.6–10.2)
Chloride: 100 mmol/L (ref 96–106)
GFR calc Af Amer: 101 mL/min/{1.73_m2} (ref >59)
GFR, EST NON AFRICAN AMERICAN: 87 mL/min/{1.73_m2} (ref >59)
GLOBULIN, TOTAL: 2.8 g/dL (ref 1.5–4.5)
Glucose: 123 mg/dL — ABNORMAL HIGH (ref 65–99)
Potassium: 4 mmol/L (ref 3.5–5.2)
SODIUM: 141 mmol/L (ref 134–144)
Total Protein: 7.1 g/dL (ref 6.0–8.5)

## 2016-11-28 LAB — CBC WITH DIFFERENTIAL/PLATELET
BASOS ABS: 0 10*3/uL (ref 0.0–0.2)
Basos: 0 %
EOS (ABSOLUTE): 0.2 10*3/uL (ref 0.0–0.4)
Eos: 3 %
HEMOGLOBIN: 12.4 g/dL — AB (ref 13.0–17.7)
Hematocrit: 38.3 % (ref 37.5–51.0)
Immature Grans (Abs): 0 10*3/uL (ref 0.0–0.1)
Immature Granulocytes: 0 %
LYMPHS ABS: 3.5 10*3/uL — AB (ref 0.7–3.1)
Lymphs: 45 %
MCH: 31 pg (ref 26.6–33.0)
MCHC: 32.4 g/dL (ref 31.5–35.7)
MCV: 96 fL (ref 79–97)
Monocytes Absolute: 0.4 10*3/uL (ref 0.1–0.9)
Monocytes: 6 %
NEUTROS ABS: 3.5 10*3/uL (ref 1.4–7.0)
Neutrophils: 46 %
PLATELETS: 225 10*3/uL (ref 150–379)
RBC: 4 x10E6/uL — ABNORMAL LOW (ref 4.14–5.80)
RDW: 13.6 % (ref 12.3–15.4)
WBC: 7.7 10*3/uL (ref 3.4–10.8)

## 2016-11-28 LAB — LIPID PANEL
CHOL/HDL RATIO: 3 ratio (ref 0.0–5.0)
Cholesterol, Total: 128 mg/dL (ref 100–199)
HDL: 43 mg/dL (ref >39)
LDL Calculated: 60 mg/dL (ref 0–99)
Triglycerides: 125 mg/dL (ref 0–149)
VLDL CHOLESTEROL CAL: 25 mg/dL (ref 5–40)

## 2016-11-28 LAB — MICROALBUMIN / CREATININE URINE RATIO
CREATININE, UR: 79.6 mg/dL
MICROALB/CREAT RATIO: 8.8 mg/g{creat} (ref 0.0–30.0)
Microalbumin, Urine: 7 ug/mL

## 2016-12-13 ENCOUNTER — Encounter: Payer: Self-pay | Admitting: Cardiology

## 2016-12-14 DIAGNOSIS — E113512 Type 2 diabetes mellitus with proliferative diabetic retinopathy with macular edema, left eye: Secondary | ICD-10-CM | POA: Diagnosis not present

## 2016-12-15 ENCOUNTER — Other Ambulatory Visit: Payer: Self-pay | Admitting: Family Medicine

## 2016-12-16 NOTE — Progress Notes (Signed)
Ethan Peters Date of Birth: [birth year] Medical Record #[id number]  History of Present Illness: [name] is seen for followup of CAD. He is status post inferior STEMI on 01/09/2013. He had stenting of the RCA at the crux. He had persistent chest pain and dyspnea even after his infarct with atypical symptoms. He subsequently underwent repeat cardiac catheterization on April 10,2014 which showed excellent patency of the stent in the RCA. He does have a long 70% stenosis in the left circumflex,  treated medically. He had a Myovew study in September 2017 which showed an inferior scar without ischemia. EF was 48%.  He has a history of poorly controlled diabetes mellitus with retinopathy and neuropathy. He is now followed by Dr. Darlyn Read.   On follow up today he reports he has not exercised much over the last month because of an eye problem that required an injection. He has a cough related to lisinopril.  He notes BS are up and down. No significant chest pain. Complains of fatigue chronically.  Current Outpatient Prescriptions on File Prior to Visit  Medication Sig Dispense Refill  . allopurinol (ZYLOPRIM) 300 MG tablet TAKE ONE TABLET BY MOUTH ONCE DAILY AS NEEDED FOR GOUT PAIN 90 tablet 0  . aspirin EC 81 MG EC tablet Take 1 tablet (81 mg total) by mouth daily.    Marland Kitchen atorvastatin (LIPITOR) 80 MG tablet TAKE ONE TABLET BY MOUTH ONCE DAILY AT 6 PM FOR CHOLESTEROL 30 tablet 1  . B Complex-C (B-COMPLEX WITH VITAMIN C) tablet Take 1 tablet by mouth as needed (for vitamin deficiency).    . gabapentin (NEURONTIN) 300 MG capsule Take 1 capsule (300 mg total) by mouth 2 (two) times daily. 60 capsule 2  . glipiZIDE (GLUCOTROL) 5 MG tablet TAKE ONE TABLET BY MOUTH ONCE DAILY BEFORE BREAKFAST 30 tablet 2  . insulin NPH Human (NOVOLIN N) 100 UNIT/ML injection Take 40 units before breakfast and supper. 30 mL 11  . levothyroxine (SYNTHROID, LEVOTHROID) 50 MCG tablet TAKE ONE TABLET BY MOUTH ONCE DAILY 30 tablet  9  . metFORMIN (GLUCOPHAGE) 1000 MG tablet TAKE ONE TABLET BY MOUTH TWICE DAILY WITH MEALS 60 tablet 0  . nitroGLYCERIN (NITROSTAT) 0.4 MG SL tablet Place 1 tablet (0.4 mg total) under the tongue every 5 (five) minutes x 3 doses as needed for chest pain. 25 tablet 11  . ONETOUCH VERIO test strip USE TO CHECK GLUCOSE 4 TIMES DAILY 125 each 2   No current facility-administered medications on file prior to visit.     Allergies  Allergen Reactions  . Levemir [Insulin Detemir] Swelling  . Penicillins Rash    Lip swelling    Past Medical History:  Diagnosis Date  . CAD (coronary artery disease) 01/09/13   Inferior STEMI s/p DES-RCA  . Diabetes mellitus    Uncontrolled, Hgb A1C 14.8% on 03/14, started on insulin  . Gout   . Hypertension   . Ischemic cardiomyopathy    EF 45-50%, grade 1 diastolic dysfunction, mildly dilated RV, RA at the upper limits of normal, mild TR, PA systolic pressure 32 mm mercury and hypokinesis to akinesis of the basal mid inferior myocardium  . Myocardial infarction 01/09/2013  . Shortness of breath     Past Surgical History:  Procedure Laterality Date  . CARDIAC CATHETERIZATION  01/22/2013   Diffuse borderline residual CAD consistent with uncontrolled diabetes, medical management recommended  . CORONARY ANGIOPLASTY WITH STENT PLACEMENT  01/09/2013   30% pLAD, 30% mLAD, 70% pLCx, RCA  occlusion at crux with R->L distal collaterals s/p DES; LVEF 50%, moderate-severe HK of inferior basal wall  . LEFT HEART CATHETERIZATION WITH CORONARY ANGIOGRAM N/A 01/09/2013   Procedure: LEFT HEART CATHETERIZATION WITH CORONARY ANGIOGRAM;  Surgeon: Kamron Portee M [place], MD;  Location[facility] LAB;  Service: Cardiovascular;  Laterality: N/A;  . LEFT HEART CATHETERIZATION WITH CORONARY ANGIOGRAM N/A 01/22/2013   Procedure: LEFT HEART CATHETERIZATION WITH CORONARY ANGIOGRAM;  Surgeon: Tonny Bollman, MD;  Location: [facility] CATH LAB;  Service: Cardiovascular;  Laterality: N/A;    History    Smoking Status  . Never Smoker  Smokeless Tobacco  . Never Used    History  Alcohol Use  . Yes    Comment: i have not drank in a year "    Family History  Problem Relation Age of Onset  . Heart disease Mother   . Heart disease Father     Review of Systems: As noted in history of present illness.  All other systems were reviewed and are negative.  Physical Exam: BP 122/80   Pulse 62   Ht  (1.676 m)   Wt 190 lb 12.8 oz (86.5 kg)   BMI 30.80 kg/m  He is a pleasant Latino male in no acute distress. HEENT: Normal. Lungs: Clear Cardiovascular: Regular rate and rhythm without gallop, murmur, or click. He has no chest wall pain to palpation.  Abdomen: Soft and nontender. No masses or bruits. Bowel sounds are positive. Extremities: No edema. Radial and pedal pulses are 2+. Skin: Warm and dry Neuro: Alert and oriented x3. Normal motor and sensory exam.  LABORATORY DATA: Lab Results  Component Value Date   WBC 7.7 11/27/2016   HGB 12.6 (A) 03/31/2015   HCT 38.3 11/27/2016   PLT 225 11/27/2016   GLUCOSE 123 (H) 11/27/2016   CHOL 128 11/27/2016   TRIG 125 11/27/2016   HDL 43 11/27/2016   LDLCALC 60 11/27/2016   ALT 30 11/27/2016   AST 32 11/27/2016   NA 141 11/27/2016   K 4.0 11/27/2016   CL 100 11/27/2016   CREATININE 0.94 11/27/2016   BUN 12 11/27/2016   CO2 22 11/27/2016   TSH 1.41 06/20/2016   PSA 0.6 09/22/2014   INR 1.16 01/21/2013   HGBA1C 8.3 11/02/2015   MICROALBUR neg 11/02/2014   Last A1c 8.1% on 04/10/16  Myoview 06/26/16:Study Highlights     The left ventricular ejection fraction is mildly decreased (45-54%).  Nuclear stress EF: 48%.  Blood pressure demonstrated a hypertensive response to exercise.  There was no ST segment deviation noted during stress.  Findings consistent with prior myocardial infarction.  This is an intermediate risk study.   Small inferobasal wall infarct no ischemia EF 48%    Ecg today shows NSR rate 62,  old inferior infarct. I have personally reviewed and interpreted this study.   Assessment / Plan: 1. Coronary disease status post inferior STEMI treated with DES to the RCA. Repeat cardiac catheterization in 2014 demonstrated continued patency. Moderate diffuse disease in the left circumflex. No ischemia by [facility] September 2017. We will continue aspirin and statin.   2. Diabetes mellitus: On metformin, glipizide, and insulin . Followed by primary care. Last A1c 8.1%.  3. Dyslipidemia. On statin therapy. Excellent control.  4. Hypertension, BP well controlled. Cough on lisinopril. Will switch to losartan 25 mg daily.  5. Diabetic retinopathy and neuropathy.    Follow up in 6 months.

## 2016-12-19 ENCOUNTER — Encounter: Payer: Self-pay | Admitting: Cardiology

## 2016-12-19 ENCOUNTER — Ambulatory Visit (INDEPENDENT_AMBULATORY_CARE_PROVIDER_SITE_OTHER): Payer: PPO | Admitting: Cardiology

## 2016-12-19 VITALS — BP 122/80 | HR 62 | Ht 66.0 in | Wt 190.8 lb

## 2016-12-19 DIAGNOSIS — E0840 Diabetes mellitus due to underlying condition with diabetic neuropathy, unspecified: Secondary | ICD-10-CM | POA: Diagnosis not present

## 2016-12-19 DIAGNOSIS — E782 Mixed hyperlipidemia: Secondary | ICD-10-CM

## 2016-12-19 DIAGNOSIS — I10 Essential (primary) hypertension: Secondary | ICD-10-CM

## 2016-12-19 DIAGNOSIS — I2510 Atherosclerotic heart disease of native coronary artery without angina pectoris: Secondary | ICD-10-CM

## 2016-12-19 MED ORDER — LOSARTAN POTASSIUM 25 MG PO TABS: 25.0000 mg | ORAL_TABLET | Freq: Every day | ORAL | 3 refills | Status: DC

## 2016-12-19 NOTE — Patient Instructions (Signed)
Stop taking lisinopril  Start losartan 25 mg daily  Continue your other therapy  Resume exercise as soon as your eye doctor gives you the OK  I will see you in 6 months

## 2017-01-04 DIAGNOSIS — E113591 Type 2 diabetes mellitus with proliferative diabetic retinopathy without macular edema, right eye: Secondary | ICD-10-CM | POA: Diagnosis not present

## 2017-01-04 DIAGNOSIS — E113512 Type 2 diabetes mellitus with proliferative diabetic retinopathy with macular edema, left eye: Secondary | ICD-10-CM | POA: Diagnosis not present

## 2017-01-25 ENCOUNTER — Other Ambulatory Visit: Payer: Self-pay | Admitting: *Deleted

## 2017-01-25 ENCOUNTER — Other Ambulatory Visit: Payer: Self-pay | Admitting: Family Medicine

## 2017-01-25 MED ORDER — ATORVASTATIN CALCIUM 80 MG PO TABS: ORAL_TABLET | ORAL | 1 refills | Status: DC

## 2017-01-25 MED ORDER — METFORMIN HCL 1000 MG PO TABS: 1000.0000 mg | ORAL_TABLET | Freq: Two times a day (BID) | ORAL | 1 refills | Status: DC

## 2017-02-17 ENCOUNTER — Other Ambulatory Visit: Payer: Self-pay | Admitting: Family Medicine

## 2017-02-21 ENCOUNTER — Other Ambulatory Visit: Payer: Self-pay | Admitting: *Deleted

## 2017-02-21 MED ORDER — GLUCOSE BLOOD VI STRP: ORAL_STRIP | 11 refills | Status: DC

## 2017-02-21 NOTE — Progress Notes (Signed)
RX for test strips should have been for 125 Pt only received 100 RX changed and sent into [facility] per pt request

## 2017-02-25 ENCOUNTER — Ambulatory Visit: Payer: PPO | Admitting: Family Medicine

## 2017-02-26 ENCOUNTER — Telehealth: Payer: Self-pay | Admitting: Family Medicine

## 2017-02-26 ENCOUNTER — Encounter: Payer: Self-pay | Admitting: Family Medicine

## 2017-03-13 ENCOUNTER — Other Ambulatory Visit: Payer: Self-pay | Admitting: Family Medicine

## 2017-03-20 ENCOUNTER — Other Ambulatory Visit: Payer: Self-pay | Admitting: Family Medicine

## 2017-03-27 ENCOUNTER — Ambulatory Visit (INDEPENDENT_AMBULATORY_CARE_PROVIDER_SITE_OTHER): Payer: PPO | Admitting: Family Medicine

## 2017-03-27 ENCOUNTER — Encounter: Payer: Self-pay | Admitting: Family Medicine

## 2017-03-27 VITALS — BP 137/81 | HR 60 | Temp 97.1°F | Ht 66.0 in | Wt 191.0 lb

## 2017-03-27 DIAGNOSIS — E0840 Diabetes mellitus due to underlying condition with diabetic neuropathy, unspecified: Secondary | ICD-10-CM | POA: Diagnosis not present

## 2017-03-27 DIAGNOSIS — Z794 Long term (current) use of insulin: Secondary | ICD-10-CM

## 2017-03-27 DIAGNOSIS — I10 Essential (primary) hypertension: Secondary | ICD-10-CM | POA: Diagnosis not present

## 2017-03-27 DIAGNOSIS — E1342 Other specified diabetes mellitus with diabetic polyneuropathy: Secondary | ICD-10-CM

## 2017-03-27 DIAGNOSIS — E782 Mixed hyperlipidemia: Secondary | ICD-10-CM | POA: Diagnosis not present

## 2017-03-27 LAB — BAYER DCA HB A1C WAIVED: HB A1C (BAYER DCA - WAIVED): 7.3 % — ABNORMAL HIGH (ref <7.0)

## 2017-03-27 NOTE — Progress Notes (Signed)
Subjective:  Patient ID: Ethan Peters, male    DOB: 1956-07-18  Age: 61 y.o. MRN: [id number]  CC: Diabetes (pt here today for routine follow up for his chronic medical condtions and also c/o left upper leg pain x 2 months.)   HPI Ethan Peters presents forFollow-up of diabetes. Patient checks blood sugar at home.  139 fasting and 165 postprandial. Occasionally 200. Coming down though. This month he used novolog & novolin N interchangeably.  Patient denies symptoms such as polyuria, polydipsia, excessive hunger, nausea No significant hypoglycemic spells noted. Medications reviewed. Pt reports taking them regularly without complication/adverse reaction being reported today.  Checking feet daily. Last eye appt was remote  History [name] has a past medical history of CAD (coronary artery disease) (01/09/13); Diabetes mellitus; Gout; Hypertension; Ischemic cardiomyopathy; Myocardial infarction ([place]) (01/09/2013); and Shortness of breath.   He has a past surgical history that includes Coronary angioplasty with stent (01/09/2013); Cardiac catheterization (01/22/2013); left heart catheterization with coronary angiogram (N/A, 01/09/2013); and left heart catheterization with coronary angiogram (N/A, 01/22/2013).   His family history includes Heart disease in his father and mother.He reports that he has never smoked. He has never used smokeless tobacco. He reports that he drinks alcohol. He reports that he does not use drugs.  Current Outpatient Prescriptions on File Prior to Visit  Medication Sig Dispense Refill  . allopurinol (ZYLOPRIM) 300 MG tablet TAKE ONE TABLET BY MOUTH ONCE DAILY AS NEEDED FOR GOUT PAIN 90 tablet 0  . aspirin EC 81 MG EC tablet Take 1 tablet (81 mg total) by mouth daily.    Marland Kitchen atorvastatin (LIPITOR) 80 MG tablet TAKE 1 TABLET BY MOUTH ONCE DAILY AT 6 PM FOR CHOLESTEROL 30 tablet 2  . B Complex-C (B-COMPLEX WITH VITAMIN C) tablet Take 1 tablet by mouth as needed (for  vitamin deficiency).    . gabapentin (NEURONTIN) 300 MG capsule Take 1 capsule (300 mg total) by mouth 2 (two) times daily. 60 capsule 2  . glipiZIDE (GLUCOTROL) 5 MG tablet TAKE 1 TABLET BY MOUTH ONCE DAILY BEFORE BREAKFAST 30 tablet 0  . glucose blood (ONETOUCH VERIO) test strip USE TO CHECK GLUCOSE FOUR TIMES DAILY 125 each 11  . insulin NPH Human (NOVOLIN N) 100 UNIT/ML injection Take 40 units before breakfast and supper. 30 mL 11  . levothyroxine (SYNTHROID, LEVOTHROID) 50 MCG tablet TAKE 1 TABLET BY MOUTH ONCE DAILY 90 tablet 0  . losartan (COZAAR) 25 MG tablet Take 1 tablet (25 mg total) by mouth daily. 90 tablet 3  . metFORMIN (GLUCOPHAGE) 1000 MG tablet Take 1 tablet (1,000 mg total) by mouth 2 (two) times daily with a meal. 180 tablet 1  . nitroGLYCERIN (NITROSTAT) 0.4 MG SL tablet Place 1 tablet (0.4 mg total) under the tongue every 5 (five) minutes x 3 doses as needed for chest pain. 25 tablet 11   No current facility-administered medications on file prior to visit.     ROS Review of Systems  Constitutional: Negative for chills, diaphoresis, fever and unexpected weight change.  HENT: Negative for congestion, hearing loss, rhinorrhea and sore throat.   Eyes: Negative for visual disturbance.  Respiratory: Negative for cough and shortness of breath.   Cardiovascular: Negative for chest pain.  Gastrointestinal: Negative for abdominal pain, constipation and diarrhea.  Genitourinary: Negative for dysuria and flank pain.  Musculoskeletal: Positive for arthralgias and myalgias (feels deep "bone pain" distal left femur X 2 months. described as ache & pressure). Negative for joint swelling.  Skin: Negative for rash.  Neurological: Negative for dizziness and headaches.  Psychiatric/Behavioral: Negative for dysphoric mood and sleep disturbance.       Forgetful - losing things    Objective:  BP 137/81   Pulse 60   Temp 97.1 F (36.2 C) (Oral)   Ht '5\' 6"'  (1.676 m)   Wt 191 lb (86.6  kg)   BMI 30.83 kg/m   BP Readings from Last 3 Encounters:  04/01/17 134/79  03/27/17 137/81  12/19/16 122/80    Wt Readings from Last 3 Encounters:  04/01/17 193 lb 3.2 oz (87.6 kg)  03/27/17 191 lb (86.6 kg)  12/19/16 190 lb 12.8 oz (86.5 kg)     Physical Exam  Constitutional: He is oriented to person, place, and time. He appears well-developed and well-nourished. No distress.  HENT:  Head: Normocephalic and atraumatic.  Right Ear: External ear normal.  Left Ear: External ear normal.  Nose: Nose normal.  Mouth/Throat: Oropharynx is clear and moist.  Eyes: Conjunctivae and EOM are normal. Pupils are equal, round, and reactive to light.  Neck: Normal range of motion. Neck supple. No thyromegaly present.  Cardiovascular: Normal rate, regular rhythm and normal heart sounds.   No murmur heard. Pulmonary/Chest: Effort normal and breath sounds normal. No respiratory distress. He has no wheezes. He has no rales.  Abdominal: Soft. Bowel sounds are normal. He exhibits no distension. There is no tenderness.  Lymphadenopathy:    He has no cervical adenopathy.  Neurological: He is alert and oriented to person, place, and time. He has normal reflexes.  Skin: Skin is warm and dry.  Psychiatric: He has a normal mood and affect. His behavior is normal. Judgment and thought content normal.    No components found for: BAYER     Assessment & Plan:   Ethan Peters was seen today for diabetes.  Diagnoses and all orders for this visit:  Essential hypertension  Long term current use of insulin ([place])  Diabetic polyneuropathy associated with other specified diabetes mellitus ([place]) -     Bayer DCA Hb A1c Waived  Mixed hyperlipidemia -     Lipid panel  Diabetes mellitus due to underlying condition with diabetic neuropathy, unspecified whether long term insulin use (HCC) -     CBC with Differential/Platelet -     CMP14+EGFR -     Lipid panel     Detailed discussion of proper use of  insulin based on type (mealtime vs baseline, Etc)   No orders of the defined types were placed in this encounter.    Follow-up: Return in about 2 weeks (around 04/10/2017) for MMSE.  Claretta Fraise, M.D.

## 2017-03-28 LAB — CBC WITH DIFFERENTIAL/PLATELET
BASOS: 1 %
Basophils Absolute: 0 10*3/uL (ref 0.0–0.2)
EOS (ABSOLUTE): 0.1 10*3/uL (ref 0.0–0.4)
Eos: 2 %
Hematocrit: 39.1 % (ref 37.5–51.0)
Hemoglobin: 12.7 g/dL — ABNORMAL LOW (ref 13.0–17.7)
IMMATURE GRANS (ABS): 0 10*3/uL (ref 0.0–0.1)
IMMATURE GRANULOCYTES: 0 %
LYMPHS: 39 %
Lymphocytes Absolute: 2.4 10*3/uL (ref 0.7–3.1)
MCH: 31.1 pg (ref 26.6–33.0)
MCHC: 32.5 g/dL (ref 31.5–35.7)
MCV: 96 fL (ref 79–97)
MONOCYTES: 9 %
Monocytes Absolute: 0.6 10*3/uL (ref 0.1–0.9)
Neutrophils Absolute: 3 10*3/uL (ref 1.4–7.0)
Neutrophils: 49 %
PLATELETS: 229 10*3/uL (ref 150–379)
RBC: 4.08 x10E6/uL — ABNORMAL LOW (ref 4.14–5.80)
RDW: 13.5 % (ref 12.3–15.4)
WBC: 6.1 10*3/uL (ref 3.4–10.8)

## 2017-03-28 LAB — CMP14+EGFR
ALK PHOS: 93 IU/L (ref 39–117)
ALT: 15 IU/L (ref 0–44)
AST: 15 IU/L (ref 0–40)
Albumin/Globulin Ratio: 1.7 (ref 1.2–2.2)
Albumin: 4.7 g/dL (ref 3.6–4.8)
BILIRUBIN TOTAL: 0.4 mg/dL (ref 0.0–1.2)
BUN/Creatinine Ratio: 16 (ref 10–24)
BUN: 18 mg/dL (ref 8–27)
CHLORIDE: 102 mmol/L (ref 96–106)
CO2: 22 mmol/L (ref 20–29)
Calcium: 9.9 mg/dL (ref 8.6–10.2)
Creatinine, Ser: 1.12 mg/dL (ref 0.76–1.27)
GFR calc Af Amer: 82 mL/min/{1.73_m2} (ref >59)
GFR calc non Af Amer: 71 mL/min/{1.73_m2} (ref >59)
GLUCOSE: 152 mg/dL — AB (ref 65–99)
Globulin, Total: 2.7 g/dL (ref 1.5–4.5)
Potassium: 5.6 mmol/L — ABNORMAL HIGH (ref 3.5–5.2)
Sodium: 139 mmol/L (ref 134–144)
TOTAL PROTEIN: 7.4 g/dL (ref 6.0–8.5)

## 2017-03-28 LAB — LIPID PANEL
CHOLESTEROL TOTAL: 115 mg/dL (ref 100–199)
Chol/HDL Ratio: 2.7 ratio (ref 0.0–5.0)
HDL: 42 mg/dL (ref >39)
LDL Calculated: 56 mg/dL (ref 0–99)
Triglycerides: 87 mg/dL (ref 0–149)
VLDL CHOLESTEROL CAL: 17 mg/dL (ref 5–40)

## 2017-03-29 ENCOUNTER — Telehealth: Payer: Self-pay | Admitting: Family Medicine

## 2017-04-01 ENCOUNTER — Ambulatory Visit (INDEPENDENT_AMBULATORY_CARE_PROVIDER_SITE_OTHER): Payer: PPO

## 2017-04-01 ENCOUNTER — Encounter: Payer: Self-pay | Admitting: Family Medicine

## 2017-04-01 ENCOUNTER — Ambulatory Visit (INDEPENDENT_AMBULATORY_CARE_PROVIDER_SITE_OTHER): Payer: PPO | Admitting: Family Medicine

## 2017-04-01 VITALS — BP 134/79 | HR 67 | Temp 98.3°F | Ht 66.0 in | Wt 193.2 lb

## 2017-04-01 DIAGNOSIS — E875 Hyperkalemia: Secondary | ICD-10-CM | POA: Diagnosis not present

## 2017-04-01 DIAGNOSIS — M795 Residual foreign body in soft tissue: Secondary | ICD-10-CM

## 2017-04-01 NOTE — Progress Notes (Signed)
   HPI  Patient presents today here with concern for foreign body.  Patient states that one day ago he was giving himself insulin injection when the needle broke off in his abdomen. He states that the tip of the needle was palpable, he got up to get a magnet to remove it and when he looked back down it was gone. He has read online that the needle can move them go to your heart possibly causing death so he is concerned.  The area is not tender or painful, there is no redness. He does have a small area proximal a 3 cm x 2 cm of induration he feels a few centimeters lateral to that injection site.  He denies fever, chills, sweats.  He is tolerating food and fluids like usual.  PMH: Smoking status noted ROS: Per HPI  Objective: BP 134/79   Pulse 67   Temp 98.3 F (36.8 C) (Oral)   Ht 5\' 6"  (1.676 m)   Wt 193 lb 3.2 oz (87.6 kg)   BMI 31.18 kg/m  Gen: NAD, alert, cooperative with exam HEENT: NCAT CV: RRR, good S1/S2, no murmur Resp: CTABL, no wheezes, non-labored Ext: No edema, warm Neuro: Alert and oriented, No gross deficits  Skin:  Abdominal wall clear of any erythema, induration, or abnormalities around the umbilicus. The injection site is clear, the area of induration that the patient describes is difficult to appreciate any abnormality.   Abdominal x-ray-personal review-no foreign body apparent  Assessment and plan:  # Foreign body Concern for foreign body, however this cannot be seen on x-ray. The exam is very reassuring, he is nontender in this area. Low threshold for follow-up if symptoms worsen or return. Would consider further imaging or referral to general surgery for their opinion.     Orders Placed This Encounter  Procedures  . DG Abd 1 View    Standing Status:   Future    Standing Expiration Date:   04/01/2018    Order Specific Question:   Reason for Exam (SYMPTOM  OR DIAGNOSIS REQUIRED)    Answer:   insulin needle broken in Abd wall, not palpable   Order Specific Question:   Preferred imaging location?    Answer:   Internal    Murtis Sink, MD [facility] 04/01/2017, 4:06 PM

## 2017-04-01 NOTE — Addendum Note (Signed)
Addended by: Bearl Mulberry on: 04/01/2017 04:49 PM   Modules accepted: Orders

## 2017-04-02 LAB — BMP8+EGFR
BUN/Creatinine Ratio: 12 (ref 10–24)
BUN: 14 mg/dL (ref 8–27)
CALCIUM: 9.3 mg/dL (ref 8.6–10.2)
CO2: 22 mmol/L (ref 20–29)
Chloride: 106 mmol/L (ref 96–106)
Creatinine, Ser: 1.13 mg/dL (ref 0.76–1.27)
GFR calc Af Amer: 81 mL/min/{1.73_m2} (ref >59)
GFR calc non Af Amer: 70 mL/min/{1.73_m2} (ref >59)
GLUCOSE: 107 mg/dL — AB (ref 65–99)
POTASSIUM: 5.1 mmol/L (ref 3.5–5.2)
SODIUM: 144 mmol/L (ref 134–144)

## 2017-04-05 DIAGNOSIS — E113513 Type 2 diabetes mellitus with proliferative diabetic retinopathy with macular edema, bilateral: Secondary | ICD-10-CM | POA: Diagnosis not present

## 2017-04-09 NOTE — Telephone Encounter (Signed)
Patient has been seen since phone call.  This encounter will now be closed 

## 2017-04-10 ENCOUNTER — Encounter: Payer: Self-pay | Admitting: Family Medicine

## 2017-04-10 ENCOUNTER — Ambulatory Visit (INDEPENDENT_AMBULATORY_CARE_PROVIDER_SITE_OTHER): Payer: PPO | Admitting: Family Medicine

## 2017-04-10 VITALS — BP 122/76 | HR 64 | Temp 97.1°F | Ht 66.0 in | Wt 192.0 lb

## 2017-04-10 DIAGNOSIS — E0840 Diabetes mellitus due to underlying condition with diabetic neuropathy, unspecified: Secondary | ICD-10-CM | POA: Diagnosis not present

## 2017-04-10 DIAGNOSIS — R413 Other amnesia: Secondary | ICD-10-CM | POA: Diagnosis not present

## 2017-04-10 MED ORDER — DONEPEZIL HCL 5 MG PO TABS: 5.0000 mg | ORAL_TABLET | Freq: Every day | ORAL | 2 refills | Status: DC

## 2017-04-10 NOTE — Progress Notes (Signed)
Subjective:  Patient ID: Ethan Peters, male    DOB: August 03, 1956  Age: 61 y.o. MRN: [id number]  CC: No chief complaint on file.   HPI Ethan Peters presents for MMSE test. Results attached. Follow-up of diabetes. Patient denies symptoms such as polyuria, polydipsia, excessive hunger, nausea No significant hypoglycemic spells noted. Medications reviewed. Pt reports taking them regularly without complication/adverse reaction being reported today.  Problems with recall confirmed. Consistent with his history of forgetfulness. Losing things frequently. Requests treatment.   History Ethan Peters has a past medical history of CAD (coronary artery disease) (01/09/13); Diabetes mellitus; Gout; Hypertension; Ischemic cardiomyopathy; Myocardial infarction (HCC) (01/09/2013); and Shortness of breath.   He has a past surgical history that includes Coronary angioplasty with stent (01/09/2013); Cardiac catheterization (01/22/2013); left heart catheterization with coronary angiogram (N/A, 01/09/2013); and left heart catheterization with coronary angiogram (N/A, 01/22/2013).   His family history includes Heart disease in his father and mother.He reports that he has never smoked. He has never used smokeless tobacco. He reports that he drinks alcohol. He reports that he does not use drugs. Also problem with repeating  A statement.   Current Outpatient Prescriptions on File Prior to Visit  Medication Sig Dispense Refill  . allopurinol (ZYLOPRIM) 300 MG tablet TAKE ONE TABLET BY MOUTH ONCE DAILY AS NEEDED FOR GOUT PAIN 90 tablet 0  . aspirin EC 81 MG EC tablet Take 1 tablet (81 mg total) by mouth daily.    Marland Kitchen atorvastatin (LIPITOR) 80 MG tablet TAKE 1 TABLET BY MOUTH ONCE DAILY AT 6 PM FOR CHOLESTEROL 30 tablet 2  . B Complex-C (B-COMPLEX WITH VITAMIN C) tablet Take 1 tablet by mouth as needed (for vitamin deficiency).    . gabapentin (NEURONTIN) 300 MG capsule Take 1 capsule (300 mg total) by mouth 2 (two) times  daily. 60 capsule 2  . glipiZIDE (GLUCOTROL) 5 MG tablet TAKE 1 TABLET BY MOUTH ONCE DAILY BEFORE BREAKFAST 30 tablet 0  . glucose blood (ONETOUCH VERIO) test strip USE TO CHECK GLUCOSE FOUR TIMES DAILY 125 each 11  . insulin NPH Human (NOVOLIN N) 100 UNIT/ML injection Take 40 units before breakfast and supper. 30 mL 11  . levothyroxine (SYNTHROID, LEVOTHROID) 50 MCG tablet TAKE 1 TABLET BY MOUTH ONCE DAILY 90 tablet 0  . losartan (COZAAR) 25 MG tablet Take 1 tablet (25 mg total) by mouth daily. 90 tablet 3  . metFORMIN (GLUCOPHAGE) 1000 MG tablet Take 1 tablet (1,000 mg total) by mouth 2 (two) times daily with a meal. 180 tablet 1  . nitroGLYCERIN (NITROSTAT) 0.4 MG SL tablet Place 1 tablet (0.4 mg total) under the tongue every 5 (five) minutes x 3 doses as needed for chest pain. 25 tablet 11   No current facility-administered medications on file prior to visit.     ROS Review of Systems  Constitutional: Negative for chills, diaphoresis and fever.  HENT: Negative for rhinorrhea and sore throat.   Respiratory: Negative for cough and shortness of breath.   Cardiovascular: Negative for chest pain.  Gastrointestinal: Negative for abdominal pain.  Musculoskeletal: Negative for arthralgias and myalgias.  Skin: Negative for rash.  Neurological: Negative for weakness and headaches.    Objective:  BP 122/76   Pulse 64   Temp 97.1 F (36.2 C) (Oral)   Ht 5\' 6"  (1.676 m)   Wt 192 lb (87.1 kg)   BMI 30.99 kg/m   BP Readings from Last 3 Encounters:  04/10/17 122/76  04/01/17 134/79  03/27/17  137/81    Wt Readings from Last 3 Encounters:  04/10/17 192 lb (87.1 kg)  04/01/17 193 lb 3.2 oz (87.6 kg)  03/27/17 191 lb (86.6 kg)     Physical Exam  Constitutional: He appears well-developed and well-nourished.  HENT:  Head: Normocephalic and atraumatic.  Right Ear: Tympanic membrane and external ear normal. No decreased hearing is noted.  Left Ear: Tympanic membrane and external ear  normal. No decreased hearing is noted.  Mouth/Throat: No oropharyngeal exudate or posterior oropharyngeal erythema.  Eyes: Pupils are equal, round, and reactive to light.  Neck: Normal range of motion. Neck supple.  Cardiovascular: Normal rate and regular rhythm.   No murmur heard. Pulmonary/Chest: Breath sounds normal. No respiratory distress.  Abdominal: Soft. Bowel sounds are normal. He exhibits no mass. There is no tenderness.  Vitals reviewed.   No components found for: BAYER     Assessment & Plan:   Diagnoses and all orders for this visit:  Loss of memory  Diabetes mellitus due to underlying condition with diabetic neuropathy, unspecified whether long term insulin use (HCC)  Other orders -     donepezil (ARICEPT) 5 MG tablet; Take 1 tablet (5 mg total) by mouth at bedtime.      I am having [name] start on donepezil. I am also having him maintain his aspirin, B-complex with vitamin C, nitroGLYCERIN, gabapentin, insulin NPH Human, losartan, metFORMIN, atorvastatin, glucose blood, levothyroxine, allopurinol, and glipiZIDE.  Meds ordered this encounter  Medications  . donepezil (ARICEPT) 5 MG tablet    Sig: Take 1 tablet (5 mg total) by mouth at bedtime.    Dispense:  30 tablet    Refill:  2     Follow-up: Return in about 1 month (around 05/10/2017).  Mechele Claude, M.D.

## 2017-04-18 ENCOUNTER — Other Ambulatory Visit: Payer: Self-pay | Admitting: Family Medicine

## 2017-05-13 ENCOUNTER — Other Ambulatory Visit: Payer: Self-pay | Admitting: Family Medicine

## 2017-05-13 ENCOUNTER — Encounter: Payer: Self-pay | Admitting: Family Medicine

## 2017-05-13 ENCOUNTER — Ambulatory Visit (INDEPENDENT_AMBULATORY_CARE_PROVIDER_SITE_OTHER): Payer: PPO | Admitting: Family Medicine

## 2017-05-13 VITALS — BP 138/83 | HR 60 | Temp 98.5°F | Ht 66.0 in | Wt 187.0 lb

## 2017-05-13 DIAGNOSIS — R413 Other amnesia: Secondary | ICD-10-CM | POA: Diagnosis not present

## 2017-05-13 DIAGNOSIS — I10 Essential (primary) hypertension: Secondary | ICD-10-CM

## 2017-05-13 MED ORDER — DONEPEZIL HCL 10 MG PO TABS: 10.0000 mg | ORAL_TABLET | Freq: Every day | ORAL | 5 refills | Status: DC

## 2017-05-13 NOTE — Patient Instructions (Signed)
It was a pleasure to see you today. Today's diagnosis was minimal memory loss and high blood pressure. Both are doing well.  It's very important that you go ahead and increase the dose of donepezil so that it will work well for you.  Best regards, Mechele Claude M.D.

## 2017-05-13 NOTE — Progress Notes (Signed)
Subjective:  Patient ID: Ethan Peters, male    DOB: [birth year]  Age: 61 y.o. MRN: [id number]  CC: Follow-up (pt here today for 1 month follow up on memory loss-pt isn't sure why he is supposed to be seen today)   HPI MIKEAL WINSTANLEY presents for Recheck of tolerance for the donepezil. He says he had a little bit of dizziness and nausea first but that was short-lived. Otherwise he's doing well complaints today. Blood sugars have been stable.  Depression screen [facility] 2/9 04/10/2017 04/01/2017 03/27/2017  Decreased Interest 0 0 0  Down, Depressed, Hopeless 0 0 0  PHQ - 2 Score 0 0 0  Altered sleeping - - -  Tired, decreased energy - - -  Change in appetite - - -  Feeling bad or failure about yourself  - - -  Trouble concentrating - - -  Moving slowly or fidgety/restless - - -  Suicidal thoughts - - -  PHQ-9 Score - - -    History Elija has a past medical history of CAD (coronary artery disease) (01/09/13); Diabetes mellitus; Gout; Hypertension; Ischemic cardiomyopathy; Myocardial infarction (HCC) (01/09/2013); and Shortness of breath.   He has a past surgical history that includes Coronary angioplasty with stent (01/09/2013); Cardiac catheterization (01/22/2013); left heart catheterization with coronary angiogram (N/A, 01/09/2013); and left heart catheterization with coronary angiogram (N/A, 01/22/2013).   His family history includes Heart disease in his father and mother.He reports that he has never smoked. He has never used smokeless tobacco. He reports that he drinks alcohol. He reports that he does not use drugs.    ROS Review of Systems  Constitutional: Negative for chills, diaphoresis and fever.  HENT: Negative for rhinorrhea and sore throat.   Respiratory: Negative for cough and shortness of breath.   Cardiovascular: Negative for chest pain.  Gastrointestinal: Negative for abdominal pain.  Musculoskeletal: Negative for arthralgias and myalgias.  Skin: Negative for rash.    Neurological: Negative for weakness and headaches.    Objective:  BP 138/83   Pulse 60   Temp 98.5 F (36.9 C) (Oral)   Ht 5\' 6"  (1.676 m)   Wt 187 lb (84.8 kg)   BMI 30.18 kg/m   BP Readings from Last 3 Encounters:  05/13/17 138/83  04/10/17 122/76  04/01/17 134/79    Wt Readings from Last 3 Encounters:  05/13/17 187 lb (84.8 kg)  04/10/17 192 lb (87.1 kg)  04/01/17 193 lb 3.2 oz (87.6 kg)     Physical Exam  Constitutional: He appears well-developed and well-nourished.  HENT:  Head: Normocephalic and atraumatic.  Right Ear: Tympanic membrane and external ear normal. No decreased hearing is noted.  Left Ear: Tympanic membrane and external ear normal. No decreased hearing is noted.  Mouth/Throat: No oropharyngeal exudate or posterior oropharyngeal erythema.  Eyes: Pupils are equal, round, and reactive to light.  Neck: Normal range of motion. Neck supple.  Cardiovascular: Normal rate and regular rhythm.   No murmur heard. Pulmonary/Chest: Breath sounds normal. No respiratory distress.  Abdominal: Soft. Bowel sounds are normal. He exhibits no mass. There is no tenderness.  Vitals reviewed.     Assessment & Plan:   [name] was seen today for follow-up.  Diagnoses and all orders for this visit:  Loss of memory  Essential hypertension  Other orders -     donepezil (ARICEPT) 10 MG tablet; Take 1 tablet (10 mg total) by mouth at bedtime.       I have changed  Mr. Blumenschein donepezil. I am also having him maintain his aspirin, B-complex with vitamin C, nitroGLYCERIN, gabapentin, insulin NPH Human, losartan, atorvastatin, glucose blood, levothyroxine, allopurinol, glipiZIDE, and metFORMIN.  Allergies as of 05/13/2017      Reactions   Levemir [insulin Detemir] Swelling   Penicillins Rash   Lip swelling      Medication List       Accurate as of 05/13/17  8:50 AM. Always use your most recent med list.          allopurinol 300 MG tablet Commonly known  as:  ZYLOPRIM TAKE ONE TABLET BY MOUTH ONCE DAILY AS NEEDED FOR GOUT PAIN   aspirin 81 MG EC tablet Take 1 tablet (81 mg total) by mouth daily.   atorvastatin 80 MG tablet Commonly known as:  LIPITOR TAKE 1 TABLET BY MOUTH ONCE DAILY AT 6 PM FOR CHOLESTEROL   B-complex with vitamin C tablet Take 1 tablet by mouth as needed (for vitamin deficiency).   donepezil 10 MG tablet Commonly known as:  ARICEPT Take 1 tablet (10 mg total) by mouth at bedtime.   gabapentin 300 MG capsule Commonly known as:  NEURONTIN Take 1 capsule (300 mg total) by mouth 2 (two) times daily.   glipiZIDE 5 MG tablet Commonly known as:  GLUCOTROL TAKE 1 TABLET BY MOUTH ONCE DAILY BEFORE BREAKFAST   glucose blood test strip Commonly known as:  ONETOUCH VERIO USE TO CHECK GLUCOSE FOUR TIMES DAILY   insulin NPH Human 100 UNIT/ML injection Commonly known as:  NOVOLIN N Take 40 units before breakfast and supper.   levothyroxine 50 MCG tablet Commonly known as:  SYNTHROID, LEVOTHROID TAKE 1 TABLET BY MOUTH ONCE DAILY   losartan 25 MG tablet Commonly known as:  COZAAR Take 1 tablet (25 mg total) by mouth daily.   metFORMIN 1000 MG tablet Commonly known as:  GLUCOPHAGE TAKE 1 TABLET BY MOUTH TWICE DAILY WITH MEALS   nitroGLYCERIN 0.4 MG SL tablet Commonly known as:  NITROSTAT Place 1 tablet (0.4 mg total) under the tongue every 5 (five) minutes x 3 doses as needed for chest pain.        Follow-up: Return in about 2 months (around 07/14/2017) for diabetes.  Mechele Claude, M.D.

## 2017-06-16 NOTE — Progress Notes (Signed)
Ethan Peters Date of Birth: May 05, 1956 Medical Record #[id number]  History of Present Illness: Ethan Peters is seen for followup of CAD. He is status post inferior STEMI on 01/09/2013. He had stenting of the RCA at the crux. He had persistent chest pain and dyspnea even after his infarct with atypical symptoms. He subsequently underwent repeat cardiac catheterization on April 10,2014 which showed excellent patency of the stent in the RCA. He does have a long 70% stenosis in the left circumflex,  treated medically. He had a Myovew study in September 2017 which showed an inferior scar without ischemia. EF was 48%.  He has a history of poorly controlled diabetes mellitus with retinopathy and neuropathy. He is now followed by Dr. Darlyn Read.   On follow up today he reports he is doing well. Reports last A1c down to 7.3%. Trying to eat healthy with more vegetables. Had an episode 2 months ago with dizziness, Nausea and vomiting follow by some chest pain. This resolved and he has no other chest pain. He is walking regularly. He was started on Aricept for memory loss.   Current Outpatient Prescriptions on File Prior to Visit  Medication Sig Dispense Refill  . allopurinol (ZYLOPRIM) 300 MG tablet TAKE ONE TABLET BY MOUTH ONCE DAILY AS NEEDED FOR GOUT PAIN 90 tablet 0  . aspirin EC 81 MG EC tablet Take 1 tablet (81 mg total) by mouth daily.    Marland Kitchen atorvastatin (LIPITOR) 80 MG tablet TAKE 1 TABLET BY MOUTH ONCE DAILY AT 6PM FOR CHOLESTEROL 90 tablet 0  . B Complex-C (B-COMPLEX WITH VITAMIN C) tablet Take 1 tablet by mouth as needed (for vitamin deficiency).    . donepezil (ARICEPT) 10 MG tablet Take 1 tablet (10 mg total) by mouth at bedtime. 30 tablet 5  . gabapentin (NEURONTIN) 300 MG capsule Take 1 capsule (300 mg total) by mouth 2 (two) times daily. 60 capsule 2  . glipiZIDE (GLUCOTROL) 5 MG tablet TAKE 1 TABLET BY MOUTH ONCE DAILY BEFORE BREAKFAST 90 tablet 0  . glucose blood (ONETOUCH VERIO) test strip  USE TO CHECK GLUCOSE FOUR TIMES DAILY 125 each 11  . insulin NPH Human (NOVOLIN N) 100 UNIT/ML injection Take 40 units before breakfast and supper. 30 mL 11  . levothyroxine (SYNTHROID, LEVOTHROID) 50 MCG tablet TAKE 1 TABLET BY MOUTH ONCE DAILY 90 tablet 0  . losartan (COZAAR) 25 MG tablet Take 1 tablet (25 mg total) by mouth daily. 90 tablet 3  . metFORMIN (GLUCOPHAGE) 1000 MG tablet TAKE 1 TABLET BY MOUTH TWICE DAILY WITH MEALS 60 tablet 4  . nitroGLYCERIN (NITROSTAT) 0.4 MG SL tablet Place 1 tablet (0.4 mg total) under the tongue every 5 (five) minutes x 3 doses as needed for chest pain. 25 tablet 11   No current facility-administered medications on file prior to visit.     Allergies  Allergen Reactions  . Levemir [Insulin Detemir] Swelling  . Penicillins Rash    Lip swelling    Past Medical History:  Diagnosis Date  . CAD (coronary artery disease) 01/09/13   Inferior STEMI s/p DES-RCA  . Diabetes mellitus    Uncontrolled, Hgb A1C 14.8% on 03/14, started on insulin  . Gout   . Hypertension   . Ischemic cardiomyopathy    EF 45-50%, grade 1 diastolic dysfunction, mildly dilated RV, RA at the upper limits of normal, mild TR, PA systolic pressure 32 mm mercury and hypokinesis to akinesis of the basal mid inferior myocardium  . Myocardial infarction (  HCC) 01/09/2013  . Shortness of breath     Past Surgical History:  Procedure Laterality Date  . CARDIAC CATHETERIZATION  01/22/2013   Diffuse borderline residual CAD consistent with uncontrolled diabetes, medical management recommended  . CORONARY ANGIOPLASTY WITH STENT PLACEMENT  01/09/2013   30% pLAD, 30% mLAD, 70% pLCx, RCA occlusion at crux with R->L distal collaterals s/p DES; LVEF 50%, moderate-severe HK of inferior basal wall  . LEFT HEART CATHETERIZATION WITH CORONARY ANGIOGRAM N/A 01/09/2013   Procedure: LEFT HEART CATHETERIZATION WITH CORONARY ANGIOGRAM;  Surgeon: Malayjah Otoole M [place], MD;  Location: [facility] CATH LAB;  Service:  Cardiovascular;  Laterality: N/A;  . LEFT HEART CATHETERIZATION WITH CORONARY ANGIOGRAM N/A 01/22/2013   Procedure: LEFT HEART CATHETERIZATION WITH CORONARY ANGIOGRAM;  Surgeon: Tonny Bollman, MD;  Location: [facility] CATH LAB;  Service: Cardiovascular;  Laterality: N/A;    History  Smoking Status  . Never Smoker  Smokeless Tobacco  . Never Used    History  Alcohol Use  . Yes    Comment: i have not drank in a year "    Family History  Problem Relation Age of Onset  . Heart disease Mother   . Heart disease Father     Review of Systems: As noted in history of present illness.  All other systems were reviewed and are negative.  Physical Exam: BP 134/86   Pulse (!) 57   Ht  (1.676 m)   Wt 187 lb (84.8 kg)   BMI 30.18 kg/m  He is a pleasant Latino male in no acute distress. HEENT: Normal. Lungs: Clear Cardiovascular: Regular rate and rhythm without gallop, murmur, or click. He has no chest wall pain to palpation.  Abdomen: Soft and nontender. No masses or bruits. Bowel sounds are positive. Extremities: No edema. Radial and pedal pulses are 2+. Skin: Warm and dry Neuro: Alert and oriented x3. Normal motor and sensory exam.  LABORATORY DATA: Lab Results  Component Value Date   WBC 6.1 03/27/2017   HGB 12.7 (L) 03/27/2017   HCT 39.1 03/27/2017   PLT 229 03/27/2017   GLUCOSE 107 (H) 04/01/2017   CHOL 115 03/27/2017   TRIG 87 03/27/2017   HDL 42 03/27/2017   LDLCALC 56 03/27/2017   ALT 15 03/27/2017   AST 15 03/27/2017   NA 144 04/01/2017   K 5.1 04/01/2017   CL 106 04/01/2017   CREATININE 1.13 04/01/2017   BUN 14 04/01/2017   CO2 22 04/01/2017   TSH 1.41 06/20/2016   PSA 0.6 09/22/2014   INR 1.16 01/21/2013   HGBA1C 8.3 11/02/2015   MICROALBUR neg 11/02/2014   Last A1c 8.1% on 04/10/16  Myoview 06/26/16:Study Highlights     The left ventricular ejection fraction is mildly decreased (45-54%).  Nuclear stress EF: 48%.  Blood pressure demonstrated a  hypertensive response to exercise.  There was no ST segment deviation noted during stress.  Findings consistent with prior myocardial infarction.  This is an intermediate risk study.   Small inferobasal wall infarct no ischemia EF 48%    Ecg today shows NSR rate 57, old inferior infarct. I have personally reviewed and interpreted this study.   Assessment / Plan: 1. Coronary disease status post inferior STEMI treated with DES to the RCA. Repeat cardiac catheterization in 2014 demonstrated continued patency. Moderate diffuse disease in the left circumflex. No ischemia by [facility] September 2017. He remains asymptomatic. We will continue aspirin and statin.   2. Diabetes mellitus: On metformin, glipizide, and insulin .  Followed by primary care. Last A1c 7.3%  3. Dyslipidemia. On statin therapy. Excellent control.  4. Hypertension, BP under fair control on losartan.  5. Diabetic retinopathy and neuropathy.    Follow up in 6 months.

## 2017-06-18 ENCOUNTER — Ambulatory Visit (INDEPENDENT_AMBULATORY_CARE_PROVIDER_SITE_OTHER): Payer: PPO | Admitting: Cardiology

## 2017-06-18 ENCOUNTER — Encounter: Payer: Self-pay | Admitting: Cardiology

## 2017-06-18 VITALS — BP 134/86 | HR 57 | Ht 66.0 in | Wt 187.0 lb

## 2017-06-18 DIAGNOSIS — I10 Essential (primary) hypertension: Secondary | ICD-10-CM | POA: Diagnosis not present

## 2017-06-18 DIAGNOSIS — Z794 Long term (current) use of insulin: Secondary | ICD-10-CM | POA: Diagnosis not present

## 2017-06-18 DIAGNOSIS — E0840 Diabetes mellitus due to underlying condition with diabetic neuropathy, unspecified: Secondary | ICD-10-CM

## 2017-06-18 DIAGNOSIS — I2510 Atherosclerotic heart disease of native coronary artery without angina pectoris: Secondary | ICD-10-CM | POA: Diagnosis not present

## 2017-06-18 DIAGNOSIS — E782 Mixed hyperlipidemia: Secondary | ICD-10-CM | POA: Diagnosis not present

## 2017-06-18 NOTE — Patient Instructions (Signed)
Continue your current therapy  I will see you in 6 months.   

## 2017-07-09 DIAGNOSIS — H40043 Steroid responder, bilateral: Secondary | ICD-10-CM | POA: Diagnosis not present

## 2017-07-09 DIAGNOSIS — E113513 Type 2 diabetes mellitus with proliferative diabetic retinopathy with macular edema, bilateral: Secondary | ICD-10-CM | POA: Diagnosis not present

## 2017-07-12 ENCOUNTER — Other Ambulatory Visit: Payer: Self-pay | Admitting: Family Medicine

## 2017-07-12 NOTE — Telephone Encounter (Signed)
Last thyroid 9/17 

## 2017-07-16 ENCOUNTER — Encounter: Payer: Self-pay | Admitting: Family Medicine

## 2017-07-16 ENCOUNTER — Ambulatory Visit (INDEPENDENT_AMBULATORY_CARE_PROVIDER_SITE_OTHER): Payer: PPO | Admitting: Family Medicine

## 2017-07-16 VITALS — BP 131/77 | HR 58 | Temp 97.4°F | Ht 66.0 in | Wt 188.0 lb

## 2017-07-16 DIAGNOSIS — E1342 Other specified diabetes mellitus with diabetic polyneuropathy: Secondary | ICD-10-CM

## 2017-07-16 DIAGNOSIS — E782 Mixed hyperlipidemia: Secondary | ICD-10-CM

## 2017-07-16 DIAGNOSIS — I10 Essential (primary) hypertension: Secondary | ICD-10-CM | POA: Diagnosis not present

## 2017-07-16 DIAGNOSIS — Z23 Encounter for immunization: Secondary | ICD-10-CM | POA: Diagnosis not present

## 2017-07-16 DIAGNOSIS — Z794 Long term (current) use of insulin: Secondary | ICD-10-CM

## 2017-07-16 DIAGNOSIS — E11319 Type 2 diabetes mellitus with unspecified diabetic retinopathy without macular edema: Secondary | ICD-10-CM | POA: Diagnosis not present

## 2017-07-16 DIAGNOSIS — E039 Hypothyroidism, unspecified: Secondary | ICD-10-CM | POA: Diagnosis not present

## 2017-07-16 DIAGNOSIS — E0840 Diabetes mellitus due to underlying condition with diabetic neuropathy, unspecified: Secondary | ICD-10-CM | POA: Diagnosis not present

## 2017-07-16 LAB — BAYER DCA HB A1C WAIVED: HB A1C (BAYER DCA - WAIVED): 7.4 % — ABNORMAL HIGH (ref <7.0)

## 2017-07-16 MED ORDER — ALLOPURINOL 300 MG PO TABS: ORAL_TABLET | ORAL | 1 refills | Status: DC

## 2017-07-16 MED ORDER — TRAZODONE HCL 150 MG PO TABS: ORAL_TABLET | ORAL | 2 refills | Status: DC

## 2017-07-16 MED ORDER — GLIPIZIDE 5 MG PO TABS: ORAL_TABLET | ORAL | 1 refills | Status: DC

## 2017-07-16 MED ORDER — INSULIN NPH (HUMAN) (ISOPHANE) 100 UNIT/ML ~~LOC~~ SUSP: SUBCUTANEOUS | 11 refills | Status: DC

## 2017-07-16 MED ORDER — LEVOTHYROXINE SODIUM 50 MCG PO TABS: 50.0000 ug | ORAL_TABLET | Freq: Every day | ORAL | 1 refills | Status: DC

## 2017-07-16 MED ORDER — METFORMIN HCL 1000 MG PO TABS: 1000.0000 mg | ORAL_TABLET | Freq: Two times a day (BID) | ORAL | 1 refills | Status: DC

## 2017-07-16 MED ORDER — GABAPENTIN 300 MG PO CAPS: 300.0000 mg | ORAL_CAPSULE | Freq: Two times a day (BID) | ORAL | 2 refills | Status: DC

## 2017-07-16 NOTE — Progress Notes (Signed)
Subjective:  Patient ID: Ethan Peters,  male    DOB: [birth year]  Age: 61 y.o.    CC: Diabetes (pt here today for follow up of his diabetes and also c/o insomnia and would like something prescribed to help him sleep. Pt states he has been on Ativan and Diazepam in the past for this problem.)   170-18ng. Patient checks  blood pressure at home. Recent readings have been good Patient denies side effects from medication. States taking it regularly.  Patient also  in for follow-up of elevated cholesterol. Doing well without complaints on current medication. Denies side effects of statin including myalgia and arthralgia and nausea. Also in today for liver function testing. Currently no chest pain, shortness of breath or other cardiovascular related symptoms noted.  Follow-up of diabetes. Patient does check blood sugar at home. Readings run between 145-155 fasting and 170-180PP Patient denies symptoms such as polyuria, polydipsia, excessive hunger, nausea A few mildt hypoglycemic spells noted. Takes something to drink to bring it up with quick resolutiion. Happens in Afternoon or night Medications reviewed. Pt reports taking them regularly. Pt. denies complication/adverse reaction today. Pt. Reports diabetic retinopathy under care of ophthalmology. Has been treated for blood leaking at retinae.   History Ethan Peters has a past medical history of CAD (coronary artery disease) (01/09/13); Diabetes mellitus; Gout; Hypertension; Ischemic cardiomyopathy; Myocardial infarction ([place]) (01/09/2013); and Shortness of breath.   He has a past surgical history that includes Coronary angioplasty with stent (01/09/2013); Cardiac catheterization (01/22/2013); left heart catheterization with coronary angiogram (N/A, 01/09/2013); and left heart catheterization with coronary angiogram (N/A, 01/22/2013).   His family history includes Heart disease in his father and mother.He reports that he has never smoked. He has never  used smokeless tobacco. He reports that he drinks alcohol. He reports that he does not use drugs.  Current Outpatient Prescriptions on File Prior to Visit  Medication Sig Dispense Refill  . aspirin EC 81 MG EC tablet Take 1 tablet (81 mg total) by mouth daily.    Marland Kitchen atorvastatin (LIPITOR) 80 MG tablet TAKE 1 TABLET BY MOUTH ONCE DAILY AT 6 PM FOR CHOLESTEROL 90 tablet 2  . B Complex-C (B-COMPLEX WITH VITAMIN C) tablet Take 1 tablet by mouth as needed (for vitamin deficiency).    . donepezil (ARICEPT) 10 MG tablet Take 1 tablet (10 mg total) by mouth at bedtime. 30 tablet 5  . glucose blood (ONETOUCH VERIO) test strip USE TO CHECK GLUCOSE FOUR TIMES DAILY 125 each 11  . losartan (COZAAR) 25 MG tablet Take 1 tablet (25 mg total) by mouth daily. 90 tablet 3  . nitroGLYCERIN (NITROSTAT) 0.4 MG SL tablet Place 1 tablet (0.4 mg total) under the tongue every 5 (five) minutes x 3 doses as needed for chest pain. (Patient not taking: Reported on 07/16/2017) 25 tablet 11   No current facility-administered medications on file prior to visit.     ROS Review of Systems  Constitutional: Negative for chills, diaphoresis, fever and unexpected weight change.  HENT: Negative for congestion, hearing loss, rhinorrhea and sore throat.   Eyes: Negative for visual disturbance.  Respiratory: Negative for cough and shortness of breath.   Cardiovascular: Negative for chest pain.  Gastrointestinal: Negative for abdominal pain, constipation and diarrhea.  Genitourinary: Negative for dysuria and flank pain.  Musculoskeletal: Negative for arthralgias and joint swelling.  Skin: Negative for rash.  Neurological: Negative for dizziness and headaches.  Psychiatric/Behavioral: Negative for dysphoric mood and sleep disturbance.  Objective:  BP 131/77   Pulse (!) 58   Temp (!) 97.4 F (36.3 C) (Oral)   Ht _0  (1.676 m)   Wt 188 lb (85.3 kg)   BMI 30.34 kg/m   BP Readings from Last 3 Encounters:  07/16/17 131/77   06/18/17 134/86  05/13/17 138/83    Wt Readings from Last 3 Encounters:  07/16/17 188 lb (85.3 kg)  06/18/17 187 lb (84.8 kg)  05/13/17 187 lb (84.8 kg)     Physical Exam  Constitutional: He is oriented to person, place, and time. He appears well-developed and well-nourished. No distress.  HENT:  Head: Normocephalic and atraumatic.  Right Ear: External ear normal.  Left Ear: External ear normal.  Nose: Nose normal.  Mouth/Throat: Oropharynx is clear and moist.  Eyes: Pupils are equal, round, and reactive to light. Conjunctivae and EOM are normal.  Neck: Normal range of motion. Neck supple. No thyromegaly present.  Cardiovascular: Normal rate, regular rhythm and normal heart sounds.   No murmur heard. Pulmonary/Chest: Effort normal and breath sounds normal. No respiratory distress. He has no wheezes. He has no rales.  Abdominal: Soft. Bowel sounds are normal. He exhibits no distension. There is no tenderness.  Lymphadenopathy:    He has no cervical adenopathy.  Neurological: He is alert and oriented to person, place, and time. He has normal reflexes.  Skin: Skin is warm and dry.  Psychiatric: He has a normal mood and affect. His behavior is normal. Judgment and thought content normal.      Assessment & Plan:   Ethan Peters was seen today for diabetes.  Diagnoses and all orders for this visit:  Diabetes mellitus due to underlying condition with diabetic neuropathy, with long-term current use of insulin ([place]) -     CMP14+EGFR -     Bayer DCA Hb A1c Waived  Mixed hyperlipidemia  Essential hypertension -     CMP14+EGFR  Diabetic polyneuropathy associated with other specified diabetes mellitus ([place])  Long term current use of insulin (HCC)  Hypothyroidism, unspecified type -     TSH -     T4, Free  Diabetic retinopathy of both eyes associated with type 2 diabetes mellitus, macular edema presence unspecified, unspecified retinopathy severity ([place])  Other orders -      allopurinol (ZYLOPRIM) 300 MG tablet; TAKE ONE TABLET BY MOUTH ONCE DAILY AS NEEDED FOR GOUT PAIN -     gabapentin (NEURONTIN) 300 MG capsule; Take 1 capsule (300 mg total) by mouth 2 (two) times daily. -     glipiZIDE (GLUCOTROL) 5 MG tablet; TAKE 1 TABLET BY MOUTH ONCE DAILY BEFORE BREAKFAST -     insulin NPH Human (NOVOLIN N) 100 UNIT/ML injection; Take 40 units before breakfast and supper. -     levothyroxine (SYNTHROID, LEVOTHROID) 50 MCG tablet; Take 1 tablet (50 mcg total) by mouth daily. -     metFORMIN (GLUCOPHAGE) 1000 MG tablet; Take 1 tablet (1,000 mg total) by mouth 2 (two) times daily with a meal. -     traZODone (DESYREL) 150 MG tablet; Take 1/3 tablet to 1 tablet QHS for sleep.   I have changed Mr. Levitz's levothyroxine and metFORMIN. I am also having him start on traZODone. Additionally, I am having him maintain his aspirin, B-complex with vitamin C, nitroGLYCERIN, losartan, glucose blood, donepezil, atorvastatin, allopurinol, gabapentin, glipiZIDE, and insulin NPH Human.  Meds ordered this encounter  Medications  . allopurinol (ZYLOPRIM) 300 MG tablet    Sig: TAKE ONE TABLET BY  MOUTH ONCE DAILY AS NEEDED FOR GOUT PAIN    Dispense:  90 tablet    Refill:  1  . gabapentin (NEURONTIN) 300 MG capsule    Sig: Take 1 capsule (300 mg total) by mouth 2 (two) times daily.    Dispense:  60 capsule    Refill:  2  . glipiZIDE (GLUCOTROL) 5 MG tablet    Sig: TAKE 1 TABLET BY MOUTH ONCE DAILY BEFORE BREAKFAST    Dispense:  90 tablet    Refill:  1  . insulin NPH Human (NOVOLIN N) 100 UNIT/ML injection    Sig: Take 40 units before breakfast and supper.    Dispense:  30 mL    Refill:  11  . levothyroxine (SYNTHROID, LEVOTHROID) 50 MCG tablet    Sig: Take 1 tablet (50 mcg total) by mouth daily.    Dispense:  90 tablet    Refill:  1  . metFORMIN (GLUCOPHAGE) 1000 MG tablet    Sig: Take 1 tablet (1,000 mg total) by mouth 2 (two) times daily with a meal.    Dispense:  180 tablet     Refill:  1    Please consider 90 day supplies to promote better adherence  . traZODone (DESYREL) 150 MG tablet    Sig: Take 1/3 tablet to 1 tablet QHS for sleep.    Dispense:  30 tablet    Refill:  2     Follow-up: No Follow-up on file.  Claretta Fraise, M.D.

## 2017-07-17 LAB — CMP14+EGFR
A/G RATIO: 2 (ref 1.2–2.2)
ALBUMIN: 4.7 g/dL (ref 3.6–4.8)
ALT: 20 IU/L (ref 0–44)
AST: 22 IU/L (ref 0–40)
Alkaline Phosphatase: 97 IU/L (ref 39–117)
BILIRUBIN TOTAL: 0.4 mg/dL (ref 0.0–1.2)
BUN / CREAT RATIO: 18 (ref 10–24)
BUN: 19 mg/dL (ref 8–27)
CALCIUM: 9.3 mg/dL (ref 8.6–10.2)
CHLORIDE: 101 mmol/L (ref 96–106)
CO2: 25 mmol/L (ref 20–29)
Creatinine, Ser: 1.03 mg/dL (ref 0.76–1.27)
GFR, EST AFRICAN AMERICAN: 90 mL/min/{1.73_m2} (ref >59)
GFR, EST NON AFRICAN AMERICAN: 78 mL/min/{1.73_m2} (ref >59)
Globulin, Total: 2.3 g/dL (ref 1.5–4.5)
Glucose: 171 mg/dL — ABNORMAL HIGH (ref 65–99)
POTASSIUM: 5.1 mmol/L (ref 3.5–5.2)
Sodium: 138 mmol/L (ref 134–144)
TOTAL PROTEIN: 7 g/dL (ref 6.0–8.5)

## 2017-07-17 LAB — T4, FREE: FREE T4: 1.43 ng/dL (ref 0.82–1.77)

## 2017-07-17 LAB — TSH: TSH: 1.13 u[IU]/mL (ref 0.450–4.500)

## 2017-08-13 ENCOUNTER — Encounter: Payer: Self-pay | Admitting: *Deleted

## 2017-09-10 DIAGNOSIS — E113512 Type 2 diabetes mellitus with proliferative diabetic retinopathy with macular edema, left eye: Secondary | ICD-10-CM | POA: Diagnosis not present

## 2017-09-10 DIAGNOSIS — E113591 Type 2 diabetes mellitus with proliferative diabetic retinopathy without macular edema, right eye: Secondary | ICD-10-CM | POA: Diagnosis not present

## 2017-10-18 ENCOUNTER — Ambulatory Visit: Payer: PPO | Admitting: Family Medicine

## 2017-10-30 DIAGNOSIS — E113512 Type 2 diabetes mellitus with proliferative diabetic retinopathy with macular edema, left eye: Secondary | ICD-10-CM | POA: Diagnosis not present

## 2017-10-30 DIAGNOSIS — E113591 Type 2 diabetes mellitus with proliferative diabetic retinopathy without macular edema, right eye: Secondary | ICD-10-CM | POA: Diagnosis not present

## 2017-11-06 ENCOUNTER — Ambulatory Visit: Payer: PPO | Admitting: Family Medicine

## 2017-11-08 ENCOUNTER — Encounter: Payer: Self-pay | Admitting: Family Medicine

## 2017-11-08 ENCOUNTER — Ambulatory Visit (INDEPENDENT_AMBULATORY_CARE_PROVIDER_SITE_OTHER): Payer: PPO | Admitting: Family Medicine

## 2017-11-08 VITALS — BP 150/71 | HR 65 | Temp 97.2°F | Ht 66.0 in | Wt 194.0 lb

## 2017-11-08 DIAGNOSIS — E1342 Other specified diabetes mellitus with diabetic polyneuropathy: Secondary | ICD-10-CM | POA: Diagnosis not present

## 2017-11-08 DIAGNOSIS — E039 Hypothyroidism, unspecified: Secondary | ICD-10-CM

## 2017-11-08 DIAGNOSIS — I10 Essential (primary) hypertension: Secondary | ICD-10-CM | POA: Diagnosis not present

## 2017-11-08 DIAGNOSIS — E782 Mixed hyperlipidemia: Secondary | ICD-10-CM

## 2017-11-08 LAB — BAYER DCA HB A1C WAIVED: HB A1C: 8.5 % — AB (ref <7.0)

## 2017-11-08 MED ORDER — ATORVASTATIN CALCIUM 80 MG PO TABS: ORAL_TABLET | ORAL | 2 refills | Status: DC

## 2017-11-08 MED ORDER — LEVOTHYROXINE SODIUM 50 MCG PO TABS: 50.0000 ug | ORAL_TABLET | Freq: Every day | ORAL | 1 refills | Status: DC

## 2017-11-08 MED ORDER — GLIPIZIDE 5 MG PO TABS: ORAL_TABLET | ORAL | 1 refills | Status: DC

## 2017-11-08 MED ORDER — GABAPENTIN 300 MG PO CAPS: 300.0000 mg | ORAL_CAPSULE | Freq: Two times a day (BID) | ORAL | 1 refills | Status: DC

## 2017-11-08 MED ORDER — BLOOD GLUCOSE MONITOR SYSTEM W/DEVICE KIT: 1.0000 | PACK | Freq: Two times a day (BID) | 0 refills | Status: DC

## 2017-11-08 MED ORDER — ALLOPURINOL 300 MG PO TABS: ORAL_TABLET | ORAL | 1 refills | Status: DC

## 2017-11-08 MED ORDER — DONEPEZIL HCL 10 MG PO TABS: 10.0000 mg | ORAL_TABLET | Freq: Every day | ORAL | 1 refills | Status: DC

## 2017-11-08 MED ORDER — TRAZODONE HCL 150 MG PO TABS: ORAL_TABLET | ORAL | 1 refills | Status: DC

## 2017-11-08 MED ORDER — METFORMIN HCL 1000 MG PO TABS: 1000.0000 mg | ORAL_TABLET | Freq: Two times a day (BID) | ORAL | 1 refills | Status: DC

## 2017-11-08 MED ORDER — LOSARTAN POTASSIUM 100 MG PO TABS
100.0000 mg | ORAL_TABLET | Freq: Every day | ORAL | 3 refills | Status: DC
Start: 2017-11-08 — End: 2018-01-06

## 2017-11-08 NOTE — Progress Notes (Signed)
Subjective:  Patient ID: Ethan Peters,  male    DOB: [birth year]  Age: 62 y.o.    CC: Diabetes (pt here today for routine follow up of his chronic medical conditions, no other concerns voiced)   HPI Ethan Peters presents for  follow-up of hypertension. Patient has no history of headache chest pain or shortness of breath or recent cough. Patient also denies symptoms of TIA such as numbness weakness lateralizing. Patient checks  blood pressure at home. Recent readings have been as high as 355-974 systolic.  Patient denies side effects from medication. States taking it regularly.  Patient also  in for follow-up of elevated cholesterol. Doing well without complaints on current medication. Denies side effects of statin including myalgia and arthralgia and nausea. Also in today for liver function testing. Currently no chest pain, shortness of breath or other cardiovascular related symptoms noted.  Follow-up of diabetes. Patient does check blood sugar at home. Readings run between 115-146 fasting and 125-165 postprandial Patient denies symptoms such as polyuria, polydipsia, excessive hunger, nausea No significant hypoglycemic spells noted. Medications reviewed. Pt reports taking them regularly. Pt. denies complication/adverse reaction today.    History Ethan Peters has a past medical history of CAD (coronary artery disease) (01/09/13), Diabetes mellitus, Gout, Hypertension, Ischemic cardiomyopathy, Myocardial infarction ([place]) (01/09/2013), and Shortness of breath.   He has a past surgical history that includes Coronary angioplasty with stent (01/09/2013); Cardiac catheterization (01/22/2013); left heart catheterization with coronary angiogram (N/A, 01/09/2013); and left heart catheterization with coronary angiogram (N/A, 01/22/2013).   His family history includes Heart disease in his father and mother.He reports that  has never smoked. he has never used smokeless tobacco. He reports that he drinks  alcohol. He reports that he does not use drugs.  Current Outpatient Medications on File Prior to Visit  Medication Sig Dispense Refill  . aspirin EC 81 MG EC tablet Take 1 tablet (81 mg total) by mouth daily.    . B Complex-C (B-COMPLEX WITH VITAMIN C) tablet Take 1 tablet by mouth as needed (for vitamin deficiency).    Marland Kitchen glucose blood (ONETOUCH VERIO) test strip USE TO CHECK GLUCOSE FOUR TIMES DAILY 125 each 11  . insulin NPH Human (NOVOLIN N) 100 UNIT/ML injection Take 40 units before breakfast and supper. 30 mL 11  . nitroGLYCERIN (NITROSTAT) 0.4 MG SL tablet Place 1 tablet (0.4 mg total) under the tongue every 5 (five) minutes x 3 doses as needed for chest pain. 25 tablet 11   No current facility-administered medications on file prior to visit.     ROS Review of Systems  Constitutional: Negative for chills, diaphoresis, fever and unexpected weight change.  HENT: Negative for congestion, hearing loss, rhinorrhea and sore throat.   Eyes: Negative for visual disturbance.  Respiratory: Negative for cough and shortness of breath.   Cardiovascular: Negative for chest pain.  Gastrointestinal: Negative for abdominal pain, constipation and diarrhea.  Genitourinary: Negative for dysuria and flank pain.  Musculoskeletal: Negative for arthralgias and joint swelling.  Skin: Negative for rash.  Neurological: Negative for dizziness and headaches.  Psychiatric/Behavioral: Negative for dysphoric mood and sleep disturbance.    Objective:  BP (!) 150/71   Pulse 65   Temp (!) 97.2 F (36.2 C) (Oral)   Ht '5\' 6"'  (1.676 m)   Wt 194 lb (88 kg)   BMI 31.31 kg/m   BP Readings from Last 3 Encounters:  11/08/17 (!) 150/71  07/16/17 131/77  06/18/17 134/86    Wt  Readings from Last 3 Encounters:  11/08/17 194 lb (88 kg)  07/16/17 188 lb (85.3 kg)  06/18/17 187 lb (84.8 kg)     Physical Exam  Constitutional: He is oriented to person, place, and time. He appears well-developed and  well-nourished. No distress.  HENT:  Head: Normocephalic and atraumatic.  Right Ear: External ear normal.  Left Ear: External ear normal.  Nose: Nose normal.  Mouth/Throat: Oropharynx is clear and moist.  Eyes: Conjunctivae and EOM are normal. Pupils are equal, round, and reactive to light.  Neck: Normal range of motion. Neck supple. No thyromegaly present.  Cardiovascular: Normal rate, regular rhythm and normal heart sounds.  No murmur heard. Pulmonary/Chest: Effort normal and breath sounds normal. No respiratory distress. He has no wheezes. He has no rales.  Abdominal: Soft. Bowel sounds are normal. He exhibits no distension. There is no tenderness.  Lymphadenopathy:    He has no cervical adenopathy.  Neurological: He is alert and oriented to person, place, and time. He has normal reflexes.  Skin: Skin is warm and dry.  Psychiatric: He has a normal mood and affect. His behavior is normal. Judgment and thought content normal.    Diabetic Foot Exam - Simple   No data filed        Assessment & Plan:   Ethan Peters was seen today for diabetes.  Diagnoses and all orders for this visit:  Essential hypertension  Diabetic polyneuropathy associated with other specified diabetes mellitus ([place]) -     Bayer DCA Hb A1c Waived  Hypothyroidism, unspecified type  Mixed hyperlipidemia  Other orders -     allopurinol (ZYLOPRIM) 300 MG tablet; TAKE ONE TABLET BY MOUTH ONCE DAILY AS NEEDED FOR GOUT PAIN -     atorvastatin (LIPITOR) 80 MG tablet; TAKE 1 TABLET BY MOUTH ONCE DAILY AT 6 PM FOR CHOLESTEROL -     donepezil (ARICEPT) 10 MG tablet; Take 1 tablet (10 mg total) by mouth at bedtime. -     gabapentin (NEURONTIN) 300 MG capsule; Take 1 capsule (300 mg total) by mouth 2 (two) times daily. -     glipiZIDE (GLUCOTROL) 5 MG tablet; TAKE 1 TABLET BY MOUTH ONCE DAILY BEFORE BREAKFAST -     levothyroxine (SYNTHROID, LEVOTHROID) 50 MCG tablet; Take 1 tablet (50 mcg total) by mouth daily. -      metFORMIN (GLUCOPHAGE) 1000 MG tablet; Take 1 tablet (1,000 mg total) by mouth 2 (two) times daily with a meal. -     traZODone (DESYREL) 150 MG tablet; Take 1/3 tablet to 1 tablet QHS for sleep. -     losartan (COZAAR) 100 MG tablet; Take 1 tablet (100 mg total) by mouth daily. -     Blood Glucose Monitoring Suppl (BLOOD GLUCOSE MONITOR SYSTEM) w/Device KIT; 1 Device by Does not apply route 2 (two) times daily.   I have discontinued Jigar E. Facer's losartan. I am also having him start on losartan and Blood Glucose Monitor System. Additionally, I am having him maintain his aspirin, B-complex with vitamin C, nitroGLYCERIN, glucose blood, insulin NPH Human, allopurinol, atorvastatin, donepezil, gabapentin, glipiZIDE, levothyroxine, metFORMIN, and traZODone.  Meds ordered this encounter  Medications  . allopurinol (ZYLOPRIM) 300 MG tablet    Sig: TAKE ONE TABLET BY MOUTH ONCE DAILY AS NEEDED FOR GOUT PAIN    Dispense:  90 tablet    Refill:  1  . atorvastatin (LIPITOR) 80 MG tablet    Sig: TAKE 1 TABLET BY MOUTH ONCE DAILY AT 6  PM FOR CHOLESTEROL    Dispense:  90 tablet    Refill:  2  . donepezil (ARICEPT) 10 MG tablet    Sig: Take 1 tablet (10 mg total) by mouth at bedtime.    Dispense:  90 tablet    Refill:  1  . gabapentin (NEURONTIN) 300 MG capsule    Sig: Take 1 capsule (300 mg total) by mouth 2 (two) times daily.    Dispense:  180 capsule    Refill:  1  . glipiZIDE (GLUCOTROL) 5 MG tablet    Sig: TAKE 1 TABLET BY MOUTH ONCE DAILY BEFORE BREAKFAST    Dispense:  90 tablet    Refill:  1  . levothyroxine (SYNTHROID, LEVOTHROID) 50 MCG tablet    Sig: Take 1 tablet (50 mcg total) by mouth daily.    Dispense:  90 tablet    Refill:  1  . metFORMIN (GLUCOPHAGE) 1000 MG tablet    Sig: Take 1 tablet (1,000 mg total) by mouth 2 (two) times daily with a meal.    Dispense:  180 tablet    Refill:  1    Please consider 90 day supplies to promote better adherence  . traZODone (DESYREL)  150 MG tablet    Sig: Take 1/3 tablet to 1 tablet QHS for sleep.    Dispense:  90 tablet    Refill:  1  . losartan (COZAAR) 100 MG tablet    Sig: Take 1 tablet (100 mg total) by mouth daily.    Dispense:  90 tablet    Refill:  3  . Blood Glucose Monitoring Suppl (BLOOD GLUCOSE MONITOR SYSTEM) w/Device KIT    Sig: 1 Device by Does not apply route 2 (two) times daily.    Dispense:  1 each    Refill:  0     Follow-up: Return in about 3 months (around 02/06/2018).  Claretta Fraise, M.D.

## 2017-11-21 ENCOUNTER — Encounter: Payer: Self-pay | Admitting: *Deleted

## 2017-12-27 ENCOUNTER — Encounter: Payer: Self-pay | Admitting: Cardiology

## 2018-01-04 NOTE — Progress Notes (Signed)
Ethan Peters Date of Birth: [birth year] Medical Record #[id number]  History of Present Illness: Ethan Peters is seen for followup of CAD. He is status post inferior STEMI on 01/09/2013. He had stenting of the RCA at the crux. He had persistent chest pain and dyspnea even after his infarct with atypical symptoms. He subsequently underwent repeat cardiac catheterization on April 10,2014 which showed excellent patency of the stent in the RCA. He does have a long 70% stenosis in the left circumflex,  treated medically. He had a Myovew study in September 2017 which showed an inferior scar without ischemia. EF was 48%.  He has a history of poorly controlled diabetes mellitus with retinopathy and neuropathy.   On follow up today he reports he is doing well. Trying to eat healthy with more vegetables. Reports sugars between 150-200. Minimal chest pain 2 weeks ago- resolved.  He is walking regularly. His losartan is recalled.   Current Outpatient Medications on File Prior to Visit  Medication Sig Dispense Refill  . allopurinol (ZYLOPRIM) 300 MG tablet TAKE ONE TABLET BY MOUTH ONCE DAILY AS NEEDED FOR GOUT PAIN 90 tablet 1  . aspirin EC 81 MG EC tablet Take 1 tablet (81 mg total) by mouth daily.    Marland Kitchen atorvastatin (LIPITOR) 80 MG tablet TAKE 1 TABLET BY MOUTH ONCE DAILY AT 6 PM FOR CHOLESTEROL 90 tablet 2  . B Complex-C (B-COMPLEX WITH VITAMIN C) tablet Take 1 tablet by mouth as needed (for vitamin deficiency).    . Blood Glucose Monitoring Suppl (BLOOD GLUCOSE MONITOR SYSTEM) w/Device KIT 1 Device by Does not apply route 2 (two) times daily. 1 each 0  . donepezil (ARICEPT) 10 MG tablet Take 1 tablet (10 mg total) by mouth at bedtime. 90 tablet 1  . gabapentin (NEURONTIN) 300 MG capsule Take 1 capsule (300 mg total) by mouth 2 (two) times daily. 180 capsule 1  . glipiZIDE (GLUCOTROL) 5 MG tablet TAKE 1 TABLET BY MOUTH ONCE DAILY BEFORE BREAKFAST 90 tablet 1  . glucose blood (ONETOUCH VERIO) test strip  USE TO CHECK GLUCOSE FOUR TIMES DAILY 125 each 11  . insulin NPH Human (NOVOLIN N) 100 UNIT/ML injection Take 40 units before breakfast and supper. 30 mL 11  . levothyroxine (SYNTHROID, LEVOTHROID) 50 MCG tablet Take 1 tablet (50 mcg total) by mouth daily. 90 tablet 1  . metFORMIN (GLUCOPHAGE) 1000 MG tablet Take 1 tablet (1,000 mg total) by mouth 2 (two) times daily with a meal. 180 tablet 1  . nitroGLYCERIN (NITROSTAT) 0.4 MG SL tablet Place 1 tablet (0.4 mg total) under the tongue every 5 (five) minutes x 3 doses as needed for chest pain. 25 tablet 11  . traZODone (DESYREL) 150 MG tablet Take 1/3 tablet to 1 tablet QHS for sleep. 90 tablet 1   No current facility-administered medications on file prior to visit.     Allergies  Allergen Reactions  . Levemir [Insulin Detemir] Swelling  . Lisinopril Cough  . Penicillins Rash    Lip swelling    Past Medical History:  Diagnosis Date  . CAD (coronary artery disease) 01/09/13   Inferior STEMI s/p DES-RCA  . Diabetes mellitus    Uncontrolled, Hgb A1C 14.8% on 03/14, started on insulin  . Gout   . Hypertension   . Ischemic cardiomyopathy    EF 16-07%, grade 1 diastolic dysfunction, mildly dilated RV, RA at the upper limits of normal, mild TR, PA systolic pressure 32 mm mercury and hypokinesis to akinesis of  the basal mid inferior myocardium  . Myocardial infarction ([place]) 01/09/2013  . Shortness of breath     Past Surgical History:  Procedure Laterality Date  . CARDIAC CATHETERIZATION  01/22/2013   Diffuse borderline residual CAD consistent with uncontrolled diabetes, medical management recommended  . CORONARY ANGIOPLASTY WITH STENT PLACEMENT  01/09/2013   30% pLAD, 30% mLAD, 70% pLCx, RCA occlusion at crux with R->L distal collaterals s/p DES; LVEF 50%, moderate-severe HK of inferior basal wall  . LEFT HEART CATHETERIZATION WITH CORONARY ANGIOGRAM N/A 01/09/2013   Procedure: LEFT HEART CATHETERIZATION WITH CORONARY ANGIOGRAM;  Surgeon:  Darnelle Corp M Martinique, MD;  Location: [facility] C[facility] LAB;  Service: Cardiovascular;  Laterality: N/A;  . LEFT HEART CATHETERIZATION WITH CORONARY ANGIOGRAM N/A 01/22/2013   Procedure: LEFT HEART CATHETERIZATION WITH CORONARY ANGIOGRAM;  Surgeon: Sherren Mocha, MD;  Location: [facility] CATH LAB;  Service: Cardiovascular;  Laterality: N/A;    Social History   Tobacco Use  Smoking Status Never Smoker  Smokeless Tobacco Never Used    Social History   Substance and Sexual Activity  Alcohol Use Yes   Comment: i have not drank in a year "    Family History  Problem Relation Age of Onset  . Heart disease Mother   . Heart disease Father     Review of Systems: As noted in history of present illness.  All other systems were reviewed and are negative.  Physical Exam: BP 122/80   Pulse 79   Ht '5\' 6"'  (1.676 m)   Wt 196 lb (88.9 kg)   BMI 31.64 kg/m  GENERAL:  Well appearing Latino male in NAD HEENT:  PERRL, EOMI, sclera are clear. Oropharynx is clear. NECK:  No jugular venous distention, carotid upstroke brisk and symmetric, no bruits, no thyromegaly or adenopathy LUNGS:  Clear to auscultation bilaterally CHEST:  Unremarkable HEART:  RRR,  PMI not displaced or sustained,S1 and S2 within normal limits, no S3, no S4: no clicks, no rubs, no murmurs ABD:  Soft, nontender. BS +, no masses or bruits. No hepatomegaly, no splenomegaly EXT:  2 + pulses throughout, no edema, no cyanosis no clubbing SKIN:  Warm and dry.  No rashes NEURO:  Alert and oriented x 3. Cranial nerves II through XII intact. PSYCH:  Cognitively intact    LABORATORY DATA: Lab Results  Component Value Date   WBC 6.1 03/27/2017   HGB 12.7 (L) 03/27/2017   HCT 39.1 03/27/2017   PLT 229 03/27/2017   GLUCOSE 171 (H) 07/16/2017   CHOL 115 03/27/2017   TRIG 87 03/27/2017   HDL 42 03/27/2017   LDLCALC 56 03/27/2017   ALT 20 07/16/2017   AST 22 07/16/2017   NA 138 07/16/2017   K 5.1 07/16/2017   CL 101 07/16/2017   CREATININE 1.03  07/16/2017   BUN 19 07/16/2017   CO2 25 07/16/2017   TSH 1.130 07/16/2017   PSA 0.6 09/22/2014   INR 1.16 01/21/2013   HGBA1C 8.3 11/02/2015   MICROALBUR neg 11/02/2014   Last A1c 8.1% on 04/10/16  Myoview 06/26/16:Study Highlights     The left ventricular ejection fraction is mildly decreased (45-54%).  Nuclear stress EF: 48%.  Blood pressure demonstrated a hypertensive response to exercise.  There was no ST segment deviation noted during stress.  Findings consistent with prior myocardial infarction.  This is an intermediate risk study.   Small inferobasal wall infarct no ischemia EF 48%    Ecg today shows NSR rate 57, old inferior infarct. I  have personally reviewed and interpreted this study.   Assessment / Plan: 1. Coronary disease status post inferior STEMI 2014 treated with DES to the RCA. Repeat cardiac catheterization in 2014 demonstrated continued patency. Moderate diffuse disease in the left circumflex. No ischemia by [facility] September 2017. He remains asymptomatic. We will continue aspirin and statin.   2. Diabetes mellitus: On metformin, glipizide, and insulin . Followed by primary care.  3. Dyslipidemia. On statin therapy. Excellent control last year. Is due for repeat lab testing. Will have this done with primary care next blood draw.  4. Hypertension, BP good  control on losartan but now recalled. It looks like candesartan is covered on his insurance - will prescribe 16 mg daily.   5. Diabetic retinopathy and neuropathy.    Follow up in 6 months.

## 2018-01-06 ENCOUNTER — Encounter: Payer: Self-pay | Admitting: Cardiology

## 2018-01-06 ENCOUNTER — Ambulatory Visit (INDEPENDENT_AMBULATORY_CARE_PROVIDER_SITE_OTHER): Payer: PPO | Admitting: Cardiology

## 2018-01-06 VITALS — BP 122/80 | HR 79 | Ht 66.0 in | Wt 196.0 lb

## 2018-01-06 DIAGNOSIS — Z794 Long term (current) use of insulin: Secondary | ICD-10-CM

## 2018-01-06 DIAGNOSIS — E782 Mixed hyperlipidemia: Secondary | ICD-10-CM | POA: Diagnosis not present

## 2018-01-06 DIAGNOSIS — E0840 Diabetes mellitus due to underlying condition with diabetic neuropathy, unspecified: Secondary | ICD-10-CM | POA: Diagnosis not present

## 2018-01-06 DIAGNOSIS — I2510 Atherosclerotic heart disease of native coronary artery without angina pectoris: Secondary | ICD-10-CM | POA: Diagnosis not present

## 2018-01-06 DIAGNOSIS — I10 Essential (primary) hypertension: Secondary | ICD-10-CM

## 2018-01-06 MED ORDER — CANDESARTAN CILEXETIL 16 MG PO TABS: 16.0000 mg | ORAL_TABLET | Freq: Every day | ORAL | 11 refills | Status: DC

## 2018-01-06 NOTE — Patient Instructions (Signed)
We will change losartan to candesartan 16 mg daily  Continue your other medication  I would recommend you have liver function and lipid panel with your next blood draw with Dr Darlyn Read.  I will see you in 6 months

## 2018-02-05 DIAGNOSIS — H40053 Ocular hypertension, bilateral: Secondary | ICD-10-CM | POA: Diagnosis not present

## 2018-02-05 DIAGNOSIS — E113513 Type 2 diabetes mellitus with proliferative diabetic retinopathy with macular edema, bilateral: Secondary | ICD-10-CM | POA: Diagnosis not present

## 2018-02-07 ENCOUNTER — Encounter: Payer: Self-pay | Admitting: Family Medicine

## 2018-02-07 ENCOUNTER — Ambulatory Visit (INDEPENDENT_AMBULATORY_CARE_PROVIDER_SITE_OTHER): Payer: PPO | Admitting: Family Medicine

## 2018-02-07 VITALS — BP 115/69 | HR 57 | Temp 97.4°F | Ht 66.0 in | Wt 187.0 lb

## 2018-02-07 DIAGNOSIS — E782 Mixed hyperlipidemia: Secondary | ICD-10-CM | POA: Diagnosis not present

## 2018-02-07 DIAGNOSIS — E559 Vitamin D deficiency, unspecified: Secondary | ICD-10-CM | POA: Diagnosis not present

## 2018-02-07 DIAGNOSIS — E11319 Type 2 diabetes mellitus with unspecified diabetic retinopathy without macular edema: Secondary | ICD-10-CM | POA: Diagnosis not present

## 2018-02-07 DIAGNOSIS — R0789 Other chest pain: Secondary | ICD-10-CM

## 2018-02-07 LAB — BAYER DCA HB A1C WAIVED: HB A1C (BAYER DCA - WAIVED): 7 % — ABNORMAL HIGH (ref <7.0)

## 2018-02-07 NOTE — Patient Instructions (Addendum)
Hypoglycemia Hypoglycemia is when the sugar (glucose) level in the blood is too low. Symptoms of low blood sugar may include:  Feeling: ? Hungry. ? Worried or nervous (anxious). ? Sweaty and clammy. ? Confused. ? Dizzy. ? Sleepy. ? Sick to your stomach (nauseous).  Having: ? A fast heartbeat. ? A headache. ? A change in your vision. ? Jerky movements that you cannot control (seizure). ? Nightmares. ? Tingling or no feeling (numbness) around the mouth, lips, or tongue.  Having trouble with: ? Talking. ? Paying attention (concentrating). ? Moving (coordination). ? Sleeping.  Shaking.  Passing out (fainting).  Getting upset easily (irritability).  Low blood sugar can happen to people who have diabetes and people who do not have diabetes. Low blood sugar can happen quickly, and it can be an emergency. Treating Low Blood Sugar Low blood sugar is often treated by eating or drinking something sugary right away. If you can think clearly and swallow safely, follow the 15:15 rule:  Take 15 grams of a fast-acting carb (carbohydrate). Some fast-acting carbs are: ? 1 tube of glucose gel. ? 3 sugar tablets (glucose pills). ? 6-8 pieces of hard candy. ? 4 oz (120 mL) of fruit juice. ? 4 oz (120 mL) of regular (not diet) soda.  Check your blood sugar 15 minutes after you take the carb.  If your blood sugar is still at or below 70 mg/dL (3.9 mmol/L), take 15 grams of a carb again.  If your blood sugar does not go above 70 mg/dL (3.9 mmol/L) after 3 tries, get help right away.  After your blood sugar goes back to normal, eat a meal or a snack within 1 hour.  Treating Very Low Blood Sugar If your blood sugar is at or below 54 mg/dL (3 mmol/L), you have very low blood sugar (severe hypoglycemia). This is an emergency. Do not wait to see if the symptoms will go away. Get medical help right away. Call your local emergency services (911 in the [place] Do not drive yourself to the  hospital. If you have very low blood sugar and you cannot eat or drink, you may need a glucagon shot (injection). A family member or friend should learn how to check your blood sugar and how to give you a glucagon shot. Ask your doctor if you need to have a glucagon shot kit at home. Follow these instructions at home: General instructions  Avoid any diets that cause you to not eat enough food. Talk with your doctor before you start any new diet.  Take over-the-counter and prescription medicines only as told by your doctor.  Limit alcohol to no more than 1 drink per day for nonpregnant women and 2 drinks per day for men. One drink equals 12 oz of beer, 5 oz of wine, or 1 oz of hard liquor.  Keep all follow-up visits as told by your doctor. This is important. If You Have Diabetes:   Make sure you know the symptoms of low blood sugar.  Always keep a source of sugar with you, such as: ? Sugar. ? Sugar tablets. ? Glucose gel. ? Fruit juice. ? Regular soda (not diet soda). ? Milk. ? Hard candy. ? Honey.  Take your medicines as told.  Follow your exercise and meal plan. ? Eat on time. Do not skip meals. ? Follow your sick day plan when you cannot eat or drink normally. Make this plan ahead of time with your doctor.  Check your blood sugar as often   as told by your doctor. Always check before and after exercise.  Share your diabetes care plan with: ? Your work or school. ? People you live with.  Check your pee (urine) for ketones: ? When you are sick. ? As told by your doctor.  Carry a card or wear jewelry that says you have diabetes. If You Have Low Blood Sugar From Other Causes:   Check your blood sugar as often as told by your doctor.  Follow instructions from your doctor about what you cannot eat or drink. Contact a doctor if:  You have trouble keeping your blood sugar in your target range.  You have low blood sugar often. Get help right away if:  You still have  symptoms after you eat or drink something sugary.  Your blood sugar is at or below 54 mg/dL (3 mmol/L).  You have jerky movements that you cannot control.  You pass out. These symptoms may be an emergency. Do not wait to see if the symptoms will go away. Get medical help right away. Call your local emergency services (911 in the [place] Do not drive yourself to the hospital. This information is not intended to replace advice given to you by your health care provider. Make sure you discuss any questions you have with your health care provider. Document Released: 12/26/2009 Document Revised: 03/08/2016 Document Reviewed: 11/04/2015 Elsevier Interactive Patient Education  2018 [place] snack of 1 serving of fruit at [employer] daily.

## 2018-02-07 NOTE — Progress Notes (Signed)
Subjective:  Patient ID: Ethan Peters,  male    DOB: 1956-01-19  Age: 62 y.o.    CC: Diabetes (pt here today for routine follow up of his chronic medical conditions)   HPI Ethan Peters presents for  follow-up of hypertension. Patient has no history of headache or shortness of breath or recent cough. Patient also denies symptoms of TIA such as numbness weakness lateralizing. Patient denies side effects from medication. States taking it regularly.  Patient also  in for follow-up of elevated cholesterol. Doing well without complaints on current medication. Denies side effects  including myalgia and arthralgia and nausea. Also in today for liver function testing. Currently no  shortness of breath  Follow-up of diabetes. Patient does check blood sugar at home. Readings run between50and 300. See attached log. Much improved during April. Still having highs in the low 200s fasting, but most in the low 100s. Last low was 2weeks ago. Several lows usually around noon meal. Patient denies symptoms such as excessive hunger or urinary frequency, excessive hunger, nausea No significant hypoglycemic spells noted. Medications reviewed. Pt reports taking them regularly. Pt. denies complication/adverse reaction today.   Pt. Developed substernal chest pain during the exam today. It was a heaviness that radiated to the Left shoulder and arm. No relation to exertion. No dyspnea or diaphoresis. Resolved spontaneously. EKG done during episode as below. Chest exam with pain neg for abnormality as well. Total episode 10 min. History Ethan Peters has a past medical history of CAD (coronary artery disease) (01/09/13), Diabetes mellitus, Gout, Hypertension, Ischemic cardiomyopathy, Myocardial infarction ([place]) (01/09/2013), and Shortness of breath.   He has a past surgical history that includes Coronary angioplasty with stent (01/09/2013); Cardiac catheterization (01/22/2013); left heart catheterization with coronary  angiogram (N/A, 01/09/2013); and left heart catheterization with coronary angiogram (N/A, 01/22/2013).   His family history includes Heart disease in his father and mother.He reports that he has never smoked. He has never used smokeless tobacco. He reports that he drinks alcohol. He reports that he does not use drugs.  Current Outpatient Medications on File Prior to Visit  Medication Sig Dispense Refill  . allopurinol (ZYLOPRIM) 300 MG tablet TAKE ONE TABLET BY MOUTH ONCE DAILY AS NEEDED FOR GOUT PAIN 90 tablet 1  . aspirin EC 81 MG EC tablet Take 1 tablet (81 mg total) by mouth daily.    Marland Kitchen atorvastatin (LIPITOR) 80 MG tablet TAKE 1 TABLET BY MOUTH ONCE DAILY AT 6 PM FOR CHOLESTEROL 90 tablet 2  . B Complex-C (B-COMPLEX WITH VITAMIN C) tablet Take 1 tablet by mouth as needed (for vitamin deficiency).    . Blood Glucose Monitoring Suppl (BLOOD GLUCOSE MONITOR SYSTEM) w/Device KIT 1 Device by Does not apply route 2 (two) times daily. 1 each 0  . candesartan (ATACAND) 16 MG tablet Take 1 tablet (16 mg total) by mouth daily. 30 tablet 11  . donepezil (ARICEPT) 10 MG tablet Take 1 tablet (10 mg total) by mouth at bedtime. 90 tablet 1  . gabapentin (NEURONTIN) 300 MG capsule Take 1 capsule (300 mg total) by mouth 2 (two) times daily. 180 capsule 1  . glipiZIDE (GLUCOTROL) 5 MG tablet TAKE 1 TABLET BY MOUTH ONCE DAILY BEFORE BREAKFAST 90 tablet 1  . glucose blood (ONETOUCH VERIO) test strip USE TO CHECK GLUCOSE FOUR TIMES DAILY 125 each 11  . insulin NPH Human (NOVOLIN N) 100 UNIT/ML injection Take 40 units before breakfast and supper. 30 mL 11  . levothyroxine (SYNTHROID, LEVOTHROID)  50 MCG tablet Take 1 tablet (50 mcg total) by mouth daily. 90 tablet 1  . metFORMIN (GLUCOPHAGE) 1000 MG tablet Take 1 tablet (1,000 mg total) by mouth 2 (two) times daily with a meal. 180 tablet 1  . nitroGLYCERIN (NITROSTAT) 0.4 MG SL tablet Place 1 tablet (0.4 mg total) under the tongue every 5 (five) minutes x 3 doses as  needed for chest pain. 25 tablet 11  . traZODone (DESYREL) 150 MG tablet Take 1/3 tablet to 1 tablet QHS for sleep. 90 tablet 1   No current facility-administered medications on file prior to visit.     ROS Review of Systems  Constitutional: Negative.   HENT: Negative.   Eyes: Negative for visual disturbance.  Respiratory: Negative for cough and shortness of breath.   Cardiovascular: Negative for chest pain and leg swelling.  Gastrointestinal: Negative for abdominal pain, diarrhea, nausea and vomiting.  Genitourinary: Negative for difficulty urinating.  Musculoskeletal: Negative for arthralgias and myalgias.  Skin: Negative for rash.  Neurological: Negative for headaches.  Psychiatric/Behavioral: Negative for sleep disturbance.    Objective:  BP 115/69   Pulse (!) 57   Temp (!) 97.4 F (36.3 C) (Oral)   Ht '5\' 6"'  (1.676 m)   Wt 187 lb (84.8 kg)   BMI 30.18 kg/m   BP Readings from Last 3 Encounters:  02/07/18 115/69  01/06/18 122/80  11/08/17 (!) 150/71    Wt Readings from Last 3 Encounters:  02/07/18 187 lb (84.8 kg)  01/06/18 196 lb (88.9 kg)  11/08/17 194 lb (88 kg)     Physical Exam  Constitutional: He is oriented to person, place, and time. He appears well-developed and well-nourished. No distress.  HENT:  Head: Normocephalic and atraumatic.  Right Ear: External ear normal.  Left Ear: External ear normal.  Nose: Nose normal.  Mouth/Throat: Oropharynx is clear and moist.  Eyes: Pupils are equal, round, and reactive to light. Conjunctivae and EOM are normal.  Neck: Normal range of motion. Neck supple. No thyromegaly present.  Cardiovascular: Normal rate, regular rhythm and normal heart sounds.  No murmur heard. Pulmonary/Chest: Effort normal and breath sounds normal. No respiratory distress. He has no wheezes. He has no rales.  Abdominal: Soft. Bowel sounds are normal. He exhibits no distension. There is no tenderness.  Lymphadenopathy:    He has no  cervical adenopathy.  Neurological: He is alert and oriented to person, place, and time. He has normal reflexes.  Skin: Skin is warm and dry.  Psychiatric: He has a normal mood and affect. His behavior is normal. Judgment and thought content normal.    EKG. NSR, old inferior MI, no acute ST segment changes indicative of ischemia were present.    Assessment & Plan:   Ethan Peters was seen today for diabetes.  Diagnoses and all orders for this visit:  Other chest pain -     EKG 12-Lead  Mixed hyperlipidemia -     CBC with Differential/Platelet -     CMP14+EGFR -     Lipid panel  Diabetic retinopathy of both eyes associated with type 2 diabetes mellitus, macular edema presence unspecified, unspecified retinopathy severity (HCC) -     Microalbumin / creatinine urine ratio -     Bayer DCA Hb A1c Waived  Vitamin D deficiency -     VITAMIN D 25 Hydroxy (Vit-D Deficiency, Fractures)   I am having Ethan Peters maintain his aspirin, B-complex with vitamin C, nitroGLYCERIN, glucose blood, insulin NPH Human, allopurinol, atorvastatin,  donepezil, gabapentin, glipiZIDE, levothyroxine, metFORMIN, traZODone, Blood Glucose Monitor System, and candesartan.     Follow-up: Return in about 3 months (around 05/09/2018).  Claretta Fraise, M.D.

## 2018-02-08 LAB — CBC WITH DIFFERENTIAL/PLATELET
BASOS: 1 %
Basophils Absolute: 0 10*3/uL (ref 0.0–0.2)
EOS (ABSOLUTE): 0.1 10*3/uL (ref 0.0–0.4)
EOS: 2 %
HEMATOCRIT: 40.6 % (ref 37.5–51.0)
HEMOGLOBIN: 13.4 g/dL (ref 13.0–17.7)
Immature Grans (Abs): 0 10*3/uL (ref 0.0–0.1)
Immature Granulocytes: 0 %
LYMPHS ABS: 2.6 10*3/uL (ref 0.7–3.1)
Lymphs: 39 %
MCH: 31.2 pg (ref 26.6–33.0)
MCHC: 33 g/dL (ref 31.5–35.7)
MCV: 95 fL (ref 79–97)
MONOCYTES: 5 %
MONOS ABS: 0.3 10*3/uL (ref 0.1–0.9)
Neutrophils Absolute: 3.5 10*3/uL (ref 1.4–7.0)
Neutrophils: 53 %
Platelets: 226 10*3/uL (ref 150–379)
RBC: 4.29 x10E6/uL (ref 4.14–5.80)
RDW: 13 % (ref 12.3–15.4)
WBC: 6.6 10*3/uL (ref 3.4–10.8)

## 2018-02-08 LAB — CMP14+EGFR
ALBUMIN: 4.8 g/dL (ref 3.6–4.8)
ALK PHOS: 118 IU/L — AB (ref 39–117)
ALT: 13 IU/L (ref 0–44)
AST: 16 IU/L (ref 0–40)
Albumin/Globulin Ratio: 1.8 (ref 1.2–2.2)
BILIRUBIN TOTAL: 0.7 mg/dL (ref 0.0–1.2)
BUN/Creatinine Ratio: 15 (ref 10–24)
BUN: 18 mg/dL (ref 8–27)
CHLORIDE: 105 mmol/L (ref 96–106)
CO2: 23 mmol/L (ref 20–29)
CREATININE: 1.22 mg/dL (ref 0.76–1.27)
Calcium: 9.5 mg/dL (ref 8.6–10.2)
GFR calc Af Amer: 73 mL/min/{1.73_m2} (ref >59)
GFR calc non Af Amer: 63 mL/min/{1.73_m2} (ref >59)
GLUCOSE: 147 mg/dL — AB (ref 65–99)
Globulin, Total: 2.7 g/dL (ref 1.5–4.5)
Potassium: 5.3 mmol/L — ABNORMAL HIGH (ref 3.5–5.2)
Sodium: 142 mmol/L (ref 134–144)
Total Protein: 7.5 g/dL (ref 6.0–8.5)

## 2018-02-08 LAB — LIPID PANEL
Chol/HDL Ratio: 2.7 ratio (ref 0.0–5.0)
Cholesterol, Total: 106 mg/dL (ref 100–199)
HDL: 39 mg/dL — AB (ref >39)
LDL CALC: 53 mg/dL (ref 0–99)
TRIGLYCERIDES: 71 mg/dL (ref 0–149)
VLDL CHOLESTEROL CAL: 14 mg/dL (ref 5–40)

## 2018-02-08 LAB — MICROALBUMIN / CREATININE URINE RATIO
CREATININE, UR: 105.2 mg/dL
MICROALBUM., U, RANDOM: 3 ug/mL
Microalb/Creat Ratio: 2.9 mg/g creat (ref 0.0–30.0)

## 2018-02-08 LAB — VITAMIN D 25 HYDROXY (VIT D DEFICIENCY, FRACTURES): Vit D, 25-Hydroxy: 27.2 ng/mL — ABNORMAL LOW (ref 30.0–100.0)

## 2018-02-22 ENCOUNTER — Encounter: Payer: Self-pay | Admitting: *Deleted

## 2018-02-22 ENCOUNTER — Other Ambulatory Visit: Payer: Self-pay | Admitting: *Deleted

## 2018-02-22 MED ORDER — ERGOCALCIFEROL 1.25 MG (50000 UT) PO CAPS: 50000.0000 [IU] | ORAL_CAPSULE | ORAL | 1 refills | Status: DC

## 2018-02-22 NOTE — Progress Notes (Signed)
rx for Vitamin D sent over.

## 2018-03-11 ENCOUNTER — Other Ambulatory Visit: Payer: Self-pay

## 2018-03-11 ENCOUNTER — Other Ambulatory Visit: Payer: Self-pay | Admitting: Family Medicine

## 2018-03-11 MED ORDER — GLUCOSE BLOOD VI STRP: ORAL_STRIP | 1 refills | Status: DC

## 2018-05-08 ENCOUNTER — Other Ambulatory Visit: Payer: Self-pay | Admitting: Family Medicine

## 2018-05-09 ENCOUNTER — Ambulatory Visit (INDEPENDENT_AMBULATORY_CARE_PROVIDER_SITE_OTHER): Payer: PPO | Admitting: Family Medicine

## 2018-05-09 VITALS — BP 133/82 | HR 55 | Temp 97.1°F | Ht 66.0 in | Wt 187.0 lb

## 2018-05-09 DIAGNOSIS — E11319 Type 2 diabetes mellitus with unspecified diabetic retinopathy without macular edema: Secondary | ICD-10-CM | POA: Diagnosis not present

## 2018-05-09 DIAGNOSIS — E1342 Other specified diabetes mellitus with diabetic polyneuropathy: Secondary | ICD-10-CM

## 2018-05-09 DIAGNOSIS — Z794 Long term (current) use of insulin: Secondary | ICD-10-CM | POA: Diagnosis not present

## 2018-05-09 DIAGNOSIS — E782 Mixed hyperlipidemia: Secondary | ICD-10-CM | POA: Diagnosis not present

## 2018-05-09 DIAGNOSIS — I10 Essential (primary) hypertension: Secondary | ICD-10-CM | POA: Diagnosis not present

## 2018-05-09 DIAGNOSIS — Z125 Encounter for screening for malignant neoplasm of prostate: Secondary | ICD-10-CM | POA: Diagnosis not present

## 2018-05-09 DIAGNOSIS — E039 Hypothyroidism, unspecified: Secondary | ICD-10-CM | POA: Diagnosis not present

## 2018-05-09 LAB — BAYER DCA HB A1C WAIVED: HB A1C (BAYER DCA - WAIVED): 8.2 % — ABNORMAL HIGH (ref <7.0)

## 2018-05-09 MED ORDER — METFORMIN HCL 1000 MG PO TABS: 1000.0000 mg | ORAL_TABLET | Freq: Two times a day (BID) | ORAL | 1 refills | Status: DC

## 2018-05-09 MED ORDER — ALLOPURINOL 300 MG PO TABS: ORAL_TABLET | ORAL | 1 refills | Status: DC

## 2018-05-09 MED ORDER — DONEPEZIL HCL 10 MG PO TABS: 10.0000 mg | ORAL_TABLET | Freq: Every day | ORAL | 1 refills | Status: DC

## 2018-05-09 MED ORDER — GABAPENTIN 300 MG PO CAPS
300.0000 mg | ORAL_CAPSULE | Freq: Two times a day (BID) | ORAL | 1 refills | Status: DC
Start: 2018-05-09 — End: 2018-08-13

## 2018-05-09 MED ORDER — LEVOTHYROXINE SODIUM 50 MCG PO TABS: 50.0000 ug | ORAL_TABLET | Freq: Every day | ORAL | 1 refills | Status: DC

## 2018-05-09 MED ORDER — TRAZODONE HCL 150 MG PO TABS: ORAL_TABLET | ORAL | 1 refills | Status: DC

## 2018-05-09 MED ORDER — ATORVASTATIN CALCIUM 80 MG PO TABS: ORAL_TABLET | ORAL | 2 refills | Status: DC

## 2018-05-09 MED ORDER — GLIPIZIDE 5 MG PO TABS: ORAL_TABLET | ORAL | 1 refills | Status: DC

## 2018-05-09 MED ORDER — CANDESARTAN CILEXETIL 16 MG PO TABS: 16.0000 mg | ORAL_TABLET | Freq: Every day | ORAL | 11 refills | Status: DC

## 2018-05-09 MED ORDER — INSULIN NPH (HUMAN) (ISOPHANE) 100 UNIT/ML ~~LOC~~ SUSP: SUBCUTANEOUS | 5 refills | Status: DC

## 2018-05-09 MED ORDER — GLUCOSE BLOOD VI STRP
ORAL_STRIP | 11 refills | Status: DC
Start: 2018-05-09 — End: 2018-07-10

## 2018-05-09 NOTE — Progress Notes (Signed)
Subjective:  Patient ID: Ethan Peters,  male    DOB: 1955-11-25  Age: 62 y.o.    CC: Medical Management of Chronic Issues   HPI [name] presents for  follow-up of hypertension. Patient has no history of headache chest pain or shortness of breath or recent cough. Patient also denies symptoms of TIA such as numbness weakness lateralizing. Patient denies side effects from medication. States taking it regularly.  Patient also  in for follow-up of elevated cholesterol. Doing well without complaints on current medication. Denies side effects  including myalgia and arthralgia and nausea. Also in today for liver function testing. Currently no chest pain, shortness of breath or other cardiovascular related symptoms noted.  Follow-up of diabetes. Patient does check blood sugar at home. Readings run "good." Patient denies symptoms such as excessive hunger or urinary frequency, excessive hunger, nausea No significant hypoglycemic spells noted. Medications reviewed. Pt reports taking them regularly. Pt. denies complication/adverse reaction today.   Patient presents for follow-up on  thyroid. The patient has a history of hypothyroidism for many years. It has been stable recently. Pt. denies any change in  voice, loss of hair, heat or cold intolerance. Energy level has been a little low especially late in the day.  Patient denies constipation and diarrhea.  However he does feel like his stomach swells up from time to time.  No myxedema. Medication is as noted below. Verified that pt is taking it daily on an empty stomach. Well tolerated. History [name] has a past medical history of CAD (coronary artery disease) (01/09/13), Diabetes mellitus, Gout, Hypertension, Ischemic cardiomyopathy, Myocardial infarction ([place]) (01/09/2013), and Shortness of breath.   He has a past surgical history that includes Coronary angioplasty with stent (01/09/2013); Cardiac catheterization (01/22/2013); left heart  catheterization with coronary angiogram (N/A, 01/09/2013); and left heart catheterization with coronary angiogram (N/A, 01/22/2013).   His family history includes Heart disease in his father and mother.He reports that he has never smoked. He has never used smokeless tobacco. He reports that he drinks alcohol. He reports that he does not use drugs.  Current Outpatient Medications on File Prior to Visit  Medication Sig Dispense Refill  . aspirin EC 81 MG EC tablet Take 1 tablet (81 mg total) by mouth daily.    . B Complex-C (B-COMPLEX WITH VITAMIN C) tablet Take 1 tablet by mouth as needed (for vitamin deficiency).    . Blood Glucose Monitoring Suppl (BLOOD GLUCOSE MONITOR SYSTEM) w/Device KIT 1 Device by Does not apply route 2 (two) times daily. 1 each 0  . ergocalciferol (VITAMIN D2) 50000 units capsule Take 1 capsule (50,000 Units total) by mouth once a week. 13 capsule 1  . nitroGLYCERIN (NITROSTAT) 0.4 MG SL tablet Place 1 tablet (0.4 mg total) under the tongue every 5 (five) minutes x 3 doses as needed for chest pain. 25 tablet 11   No current facility-administered medications on file prior to visit.     ROS Review of Systems  Constitutional: Negative.   HENT: Negative.   Eyes: Negative for visual disturbance.  Respiratory: Negative for cough and shortness of breath.   Cardiovascular: Negative for chest pain and leg swelling.  Gastrointestinal: Positive for abdominal distention. Negative for abdominal pain, diarrhea, nausea and vomiting.  Genitourinary: Negative for difficulty urinating.  Musculoskeletal: Negative for arthralgias and myalgias.  Skin: Negative for rash.  Neurological: Negative for headaches.  Psychiatric/Behavioral: Negative for sleep disturbance.    Objective:  BP 133/82   Pulse Marland Kitchen)  55   Temp (!) 97.1 F (36.2 C) (Oral)   Ht 5' 6" (1.676 m)   Wt 187 lb (84.8 kg)   BMI 30.18 kg/m   BP Readings from Last 3 Encounters:  05/09/18 133/82  02/07/18 115/69    01/06/18 122/80    Wt Readings from Last 3 Encounters:  05/09/18 187 lb (84.8 kg)  02/07/18 187 lb (84.8 kg)  01/06/18 196 lb (88.9 kg)     Physical Exam  Constitutional: He is oriented to person, place, and time. He appears well-developed and well-nourished. No distress.  HENT:  Head: Normocephalic and atraumatic.  Right Ear: External ear normal.  Left Ear: External ear normal.  Nose: Nose normal.  Mouth/Throat: Oropharynx is clear and moist.  Eyes: Pupils are equal, round, and reactive to light. Conjunctivae and EOM are normal.  Neck: Normal range of motion. Neck supple.  Cardiovascular: Normal rate, regular rhythm and normal heart sounds.  No murmur heard. Pulmonary/Chest: Effort normal and breath sounds normal. No respiratory distress. He has no wheezes. He has no rales.  Abdominal: Soft. There is no tenderness.  Musculoskeletal: Normal range of motion.  Neurological: He is alert and oriented to person, place, and time. He has normal reflexes.  Skin: Skin is warm and dry.  Psychiatric: He has a normal mood and affect. His behavior is normal. Judgment and thought content normal.    Diabetic Foot Exam - Simple   Simple Foot Form Diabetic Foot exam was performed with the following findings:  Yes 05/09/2018 10:30 AM  Visual Inspection No deformities, no ulcerations, no other skin breakdown bilaterally:  Yes Sensation Testing Intact to touch and monofilament testing bilaterally:  Yes Pulse Check Posterior Tibialis and Dorsalis pulse intact bilaterally:  Yes Comments       Assessment & Plan:   [name] was seen today for medical management of chronic issues.  Diagnoses and all orders for this visit:  Hypothyroidism, unspecified type -     CBC with Differential/Platelet -     CMP14+EGFR -     TSH + free T4  Essential hypertension -     CBC with Differential/Platelet -     CMP14+EGFR  Diabetic retinopathy of both eyes associated with type 2 diabetes mellitus,  macular edema presence unspecified, unspecified retinopathy severity (HCC) -     CBC with Differential/Platelet -     CMP14+EGFR -     Bayer DCA Hb A1c Waived  Diabetic polyneuropathy associated with other specified diabetes mellitus ([place]) -     CBC with Differential/Platelet -     CMP14+EGFR -     Bayer DCA Hb A1c Waived  Long term current use of insulin (HCC) -     CMP14+EGFR  Mixed hyperlipidemia -     CMP14+EGFR  Screening for prostate cancer -     PSA Total (Reflex To Free)  Other orders -     traZODone (DESYREL) 150 MG tablet; Take 1/3 tablet to 1 tablet QHS for sleep. -     glucose blood (ONETOUCH VERIO) test strip; USE TO CHECK GLUCOSE 4 TIMES DAILY -     metFORMIN (GLUCOPHAGE) 1000 MG tablet; Take 1 tablet (1,000 mg total) by mouth 2 (two) times daily with a meal. -     levothyroxine (SYNTHROID, LEVOTHROID) 50 MCG tablet; Take 1 tablet (50 mcg total) by mouth daily. -     insulin NPH Human (NOVOLIN N) 100 UNIT/ML injection; Take 40 units before breakfast and supper. -  glipiZIDE (GLUCOTROL) 5 MG tablet; TAKE 1 TABLET BY MOUTH ONCE DAILY BEFORE BREAKFAST -     gabapentin (NEURONTIN) 300 MG capsule; Take 1 capsule (300 mg total) by mouth 2 (two) times daily. -     donepezil (ARICEPT) 10 MG tablet; Take 1 tablet (10 mg total) by mouth at bedtime. -     candesartan (ATACAND) 16 MG tablet; Take 1 tablet (16 mg total) by mouth daily. -     atorvastatin (LIPITOR) 80 MG tablet; TAKE 1 TABLET BY MOUTH ONCE DAILY AT 6 PM FOR CHOLESTEROL -     allopurinol (ZYLOPRIM) 300 MG tablet; TAKE ONE TABLET BY MOUTH ONCE DAILY AS NEEDED FOR GOUT PAIN   I have changed Kyran E. Allums's ONETOUCH VERIO to glucose blood. I am also having him maintain his aspirin, B-complex with vitamin C, nitroGLYCERIN, Blood Glucose Monitor System, ergocalciferol, traZODone, metFORMIN, levothyroxine, insulin NPH Human, glipiZIDE, gabapentin, donepezil, candesartan, atorvastatin, and allopurinol.  Meds ordered  this encounter  Medications  . traZODone (DESYREL) 150 MG tablet    Sig: Take 1/3 tablet to 1 tablet QHS for sleep.    Dispense:  90 tablet    Refill:  1  . glucose blood (ONETOUCH VERIO) test strip    Sig: USE TO CHECK GLUCOSE 4 TIMES DAILY    Dispense:  300 each    Refill:  11    E11.9  . metFORMIN (GLUCOPHAGE) 1000 MG tablet    Sig: Take 1 tablet (1,000 mg total) by mouth 2 (two) times daily with a meal.    Dispense:  180 tablet    Refill:  1    Please consider 90 day supplies to promote better adherence  . levothyroxine (SYNTHROID, LEVOTHROID) 50 MCG tablet    Sig: Take 1 tablet (50 mcg total) by mouth daily.    Dispense:  90 tablet    Refill:  1  . insulin NPH Human (NOVOLIN N) 100 UNIT/ML injection    Sig: Take 40 units before breakfast and supper.    Dispense:  30 mL    Refill:  5  . glipiZIDE (GLUCOTROL) 5 MG tablet    Sig: TAKE 1 TABLET BY MOUTH ONCE DAILY BEFORE BREAKFAST    Dispense:  90 tablet    Refill:  1  . gabapentin (NEURONTIN) 300 MG capsule    Sig: Take 1 capsule (300 mg total) by mouth 2 (two) times daily.    Dispense:  180 capsule    Refill:  1  . donepezil (ARICEPT) 10 MG tablet    Sig: Take 1 tablet (10 mg total) by mouth at bedtime.    Dispense:  90 tablet    Refill:  1  . candesartan (ATACAND) 16 MG tablet    Sig: Take 1 tablet (16 mg total) by mouth daily.    Dispense:  30 tablet    Refill:  11  . atorvastatin (LIPITOR) 80 MG tablet    Sig: TAKE 1 TABLET BY MOUTH ONCE DAILY AT 6 PM FOR CHOLESTEROL    Dispense:  90 tablet    Refill:  2  . allopurinol (ZYLOPRIM) 300 MG tablet    Sig: TAKE ONE TABLET BY MOUTH ONCE DAILY AS NEEDED FOR GOUT PAIN    Dispense:  90 tablet    Refill:  1     Follow-up: No follow-ups on file.  Claretta Fraise, M.D.

## 2018-05-10 LAB — PSA TOTAL (REFLEX TO FREE): PROSTATE SPECIFIC AG, SERUM: 0.9 ng/mL (ref 0.0–4.0)

## 2018-05-10 LAB — CMP14+EGFR
A/G RATIO: 1.8 (ref 1.2–2.2)
ALK PHOS: 77 IU/L (ref 39–117)
ALT: 16 IU/L (ref 0–44)
AST: 14 IU/L (ref 0–40)
Albumin: 4.6 g/dL (ref 3.6–4.8)
BILIRUBIN TOTAL: 0.4 mg/dL (ref 0.0–1.2)
BUN / CREAT RATIO: 15 (ref 10–24)
BUN: 16 mg/dL (ref 8–27)
CHLORIDE: 103 mmol/L (ref 96–106)
CO2: 24 mmol/L (ref 20–29)
Calcium: 9.2 mg/dL (ref 8.6–10.2)
Creatinine, Ser: 1.06 mg/dL (ref 0.76–1.27)
GFR calc non Af Amer: 75 mL/min/{1.73_m2} (ref >59)
GFR, EST AFRICAN AMERICAN: 87 mL/min/{1.73_m2} (ref >59)
Globulin, Total: 2.6 g/dL (ref 1.5–4.5)
Glucose: 158 mg/dL — ABNORMAL HIGH (ref 65–99)
POTASSIUM: 4.7 mmol/L (ref 3.5–5.2)
Sodium: 142 mmol/L (ref 134–144)
TOTAL PROTEIN: 7.2 g/dL (ref 6.0–8.5)

## 2018-05-10 LAB — CBC WITH DIFFERENTIAL/PLATELET
BASOS ABS: 0.1 10*3/uL (ref 0.0–0.2)
Basos: 1 %
EOS (ABSOLUTE): 0.1 10*3/uL (ref 0.0–0.4)
Eos: 2 %
Hematocrit: 37.5 % (ref 37.5–51.0)
Hemoglobin: 12.4 g/dL — ABNORMAL LOW (ref 13.0–17.7)
IMMATURE GRANS (ABS): 0 10*3/uL (ref 0.0–0.1)
Immature Granulocytes: 0 %
LYMPHS: 34 %
Lymphocytes Absolute: 2.6 10*3/uL (ref 0.7–3.1)
MCH: 30.9 pg (ref 26.6–33.0)
MCHC: 33.1 g/dL (ref 31.5–35.7)
MCV: 94 fL (ref 79–97)
Monocytes Absolute: 0.5 10*3/uL (ref 0.1–0.9)
Monocytes: 7 %
NEUTROS ABS: 4.3 10*3/uL (ref 1.4–7.0)
NEUTROS PCT: 56 %
Platelets: 190 10*3/uL (ref 150–450)
RBC: 4.01 x10E6/uL — ABNORMAL LOW (ref 4.14–5.80)
RDW: 12.6 % (ref 12.3–15.4)
WBC: 7.6 10*3/uL (ref 3.4–10.8)

## 2018-05-10 LAB — TSH+FREE T4
Free T4: 1.4 ng/dL (ref 0.82–1.77)
TSH: 1.51 u[IU]/mL (ref 0.450–4.500)

## 2018-05-14 DIAGNOSIS — E113513 Type 2 diabetes mellitus with proliferative diabetic retinopathy with macular edema, bilateral: Secondary | ICD-10-CM | POA: Diagnosis not present

## 2018-05-14 DIAGNOSIS — H40043 Steroid responder, bilateral: Secondary | ICD-10-CM | POA: Diagnosis not present

## 2018-05-21 ENCOUNTER — Encounter: Payer: Self-pay | Admitting: Family Medicine

## 2018-07-05 NOTE — Progress Notes (Signed)
Ethan Peters Date of Birth: 06/23/1956 Medical Record #[id number]  History of Present Illness: Mr. Reisig is seen for followup of CAD. He is status post inferior STEMI on 01/09/2013. He had stenting of the RCA at the crux. He had persistent chest pain and dyspnea even after his infarct with atypical symptoms. He subsequently underwent repeat cardiac catheterization on April 10,2014 which showed excellent patency of the stent in the RCA. He does have a long 70% stenosis in the left circumflex,  treated medically. He had a Myovew study in September 2017 which showed an inferior scar without ischemia. EF was 48%.  He has a history of poorly controlled diabetes mellitus with retinopathy and neuropathy.   On follow up today he reports he is doing well. Trying to eat healthy with more vegetables. Reports sugars between 150-175. Has atypical chest pain for which he took Ntg on 3 occasions.   He is walking regularly. He has lost 12 lbs.   Current Outpatient Medications on File Prior to Visit  Medication Sig Dispense Refill  . allopurinol (ZYLOPRIM) 300 MG tablet TAKE ONE TABLET BY MOUTH ONCE DAILY AS NEEDED FOR GOUT PAIN 90 tablet 1  . aspirin EC 81 MG EC tablet Take 1 tablet (81 mg total) by mouth daily.    Marland Kitchen atorvastatin (LIPITOR) 80 MG tablet TAKE 1 TABLET BY MOUTH ONCE DAILY AT 6 PM FOR CHOLESTEROL 90 tablet 2  . B Complex-C (B-COMPLEX WITH VITAMIN C) tablet Take 1 tablet by mouth as needed (for vitamin deficiency).    . Blood Glucose Monitoring Suppl (BLOOD GLUCOSE MONITOR SYSTEM) w/Device KIT 1 Device by Does not apply route 2 (two) times daily. 1 each 0  . candesartan (ATACAND) 16 MG tablet Take 1 tablet (16 mg total) by mouth daily. 30 tablet 11  . donepezil (ARICEPT) 10 MG tablet Take 1 tablet (10 mg total) by mouth at bedtime. 90 tablet 1  . ergocalciferol (VITAMIN D2) 50000 units capsule Take 1 capsule (50,000 Units total) by mouth once a week. 13 capsule 1  . gabapentin (NEURONTIN) 300  MG capsule Take 1 capsule (300 mg total) by mouth 2 (two) times daily. 180 capsule 1  . glipiZIDE (GLUCOTROL) 5 MG tablet TAKE 1 TABLET BY MOUTH ONCE DAILY BEFORE BREAKFAST 90 tablet 1  . glucose blood (ONETOUCH VERIO) test strip USE TO CHECK GLUCOSE 4 TIMES DAILY 300 each 11  . insulin NPH Human (NOVOLIN N) 100 UNIT/ML injection Take 40 units before breakfast and supper. 30 mL 5  . levothyroxine (SYNTHROID, LEVOTHROID) 50 MCG tablet Take 1 tablet (50 mcg total) by mouth daily. 90 tablet 1  . metFORMIN (GLUCOPHAGE) 1000 MG tablet Take 1 tablet (1,000 mg total) by mouth 2 (two) times daily with a meal. 180 tablet 1  . nitroGLYCERIN (NITROSTAT) 0.4 MG SL tablet Place 1 tablet (0.4 mg total) under the tongue every 5 (five) minutes x 3 doses as needed for chest pain. 25 tablet 11  . traZODone (DESYREL) 150 MG tablet Take 1/3 tablet to 1 tablet QHS for sleep. 90 tablet 1   No current facility-administered medications on file prior to visit.     Allergies  Allergen Reactions  . Levemir [Insulin Detemir] Swelling  . Lisinopril Cough  . Penicillins Rash    Lip swelling    Past Medical History:  Diagnosis Date  . CAD (coronary artery disease) 01/09/13   Inferior STEMI s/p DES-RCA  . Diabetes mellitus    Uncontrolled, Hgb A1C 14.8% on  03/14, started on insulin  . Gout   . Hypertension   . Ischemic cardiomyopathy    EF 65-68%, grade 1 diastolic dysfunction, mildly dilated RV, RA at the upper limits of normal, mild TR, PA systolic pressure 32 mm mercury and hypokinesis to akinesis of the basal mid inferior myocardium  . Myocardial infarction ([place]) 01/09/2013  . Shortness of breath     Past Surgical History:  Procedure Laterality Date  . CARDIAC CATHETERIZATION  01/22/2013   Diffuse borderline residual CAD consistent with uncontrolled diabetes, medical management recommended  . CORONARY ANGIOPLASTY WITH STENT PLACEMENT  01/09/2013   30% pLAD, 30% mLAD, 70% pLCx, RCA occlusion at crux with  R->L distal collaterals s/p DES; LVEF 50%, moderate-severe HK of inferior basal wall  . LEFT HEART CATHETERIZATION WITH CORONARY ANGIOGRAM N/A 01/09/2013   Procedure: LEFT HEART CATHETERIZATION WITH CORONARY ANGIOGRAM;  Surgeon: Peter M Martinique, MD;  [facility]: [facility] CATH LAB;  Service: Cardiovascular;  Laterality: N/A;  . LEFT HEART CATHETERIZATION WITH CORONARY ANGIOGRAM N/A 01/22/2013   Procedure: LEFT HEART CATHETERIZATION WITH CORONARY ANGIOGRAM;  Surgeon: Sherren Mocha, MD;  Location: [facility] CATH LAB;  Service: Cardiovascular;  Laterality: N/A;    Social History   Tobacco Use  Smoking Status Never Smoker  Smokeless Tobacco Never Used    Social History   Substance and Sexual Activity  Alcohol Use Yes   Comment: i have not drank in a year "    Family History  Problem Relation Age of Onset  . Heart disease Mother   . Heart disease Father     Review of Systems: As noted in history of present illness.  All other systems were reviewed and are negative.  Physical Exam: BP 130/80 (BP Location: Right Arm, Cuff Size: Large)   Pulse 73   Ht _0  (1.676 m)   Wt 175 lb 6.4 oz (79.6 kg)   BMI 28.31 kg/m  GENERAL:  Well appearing male in NAD HEENT:  PERRL, EOMI, sclera are clear. Oropharynx is clear. NECK:  No jugular venous distention, carotid upstroke brisk and symmetric, no bruits, no thyromegaly or adenopathy LUNGS:  Clear to auscultation bilaterally CHEST:  Unremarkable HEART:  RRR,  PMI not displaced or sustained,S1 and S2 within normal limits, no S3, no S4: no clicks, no rubs, no murmurs ABD:  Soft, nontender. BS +, no masses or bruits. No hepatomegaly, no splenomegaly EXT:  2 + pulses throughout, no edema, no cyanosis no clubbing SKIN:  Warm and dry.  No rashes NEURO:  Alert and oriented x 3. Cranial nerves II through XII intact. PSYCH:  Cognitively intact      LABORATORY DATA: Lab Results  Component Value Date   WBC 7.6 05/09/2018   HGB 12.4 (L) 05/09/2018   HCT 37.5  05/09/2018   PLT 190 05/09/2018   GLUCOSE 158 (H) 05/09/2018   CHOL 106 02/07/2018   TRIG 71 02/07/2018   HDL 39 (L) 02/07/2018   LDLCALC 53 02/07/2018   ALT 16 05/09/2018   AST 14 05/09/2018   NA 142 05/09/2018   K 4.7 05/09/2018   CL 103 05/09/2018   CREATININE 1.06 05/09/2018   BUN 16 05/09/2018   CO2 24 05/09/2018   TSH 1.510 05/09/2018   PSA 0.6 09/22/2014   INR 1.16 01/21/2013   HGBA1C 8.3 11/02/2015   MICROALBUR neg 11/02/2014   Last A1c 8.1% on 04/10/16  Myoview 06/26/16:Study Highlights     The left ventricular ejection fraction is mildly decreased (45-54%).  Nuclear  stress EF: 48%.  Blood pressure demonstrated a hypertensive response to exercise.  There was no ST segment deviation noted during stress.  Findings consistent with prior myocardial infarction.  This is an intermediate risk study.   Small inferobasal wall infarct no ischemia EF 48%       Assessment / Plan: 1. Coronary disease status post inferior STEMI 2014 treated with DES to the RCA. Repeat cardiac catheterization in 2014 demonstrated continued patency. Moderate diffuse disease in the left circumflex. No ischemia by [facility] September 2017. He is minimally symptomatic. We will continue aspirin and statin.   2. Diabetes mellitus: On metformin, glipizide, and insulin . Followed by primary care.  3. Dyslipidemia. On statin therapy. Excellent control.  4. Hypertension, BP good control on current medications  5. Diabetic retinopathy and neuropathy. Excellent pedal pulses   Follow up in 6 months.

## 2018-07-07 ENCOUNTER — Encounter: Payer: Self-pay | Admitting: Cardiology

## 2018-07-07 ENCOUNTER — Ambulatory Visit (INDEPENDENT_AMBULATORY_CARE_PROVIDER_SITE_OTHER): Payer: PPO | Admitting: Cardiology

## 2018-07-07 VITALS — BP 130/80 | HR 73 | Ht 66.0 in | Wt 175.4 lb

## 2018-07-07 DIAGNOSIS — I10 Essential (primary) hypertension: Secondary | ICD-10-CM

## 2018-07-07 DIAGNOSIS — E0840 Diabetes mellitus due to underlying condition with diabetic neuropathy, unspecified: Secondary | ICD-10-CM | POA: Diagnosis not present

## 2018-07-07 DIAGNOSIS — E782 Mixed hyperlipidemia: Secondary | ICD-10-CM

## 2018-07-07 DIAGNOSIS — I25118 Atherosclerotic heart disease of native coronary artery with other forms of angina pectoris: Secondary | ICD-10-CM

## 2018-07-07 DIAGNOSIS — Z794 Long term (current) use of insulin: Secondary | ICD-10-CM

## 2018-07-07 NOTE — Patient Instructions (Signed)
Continue your current therapy  I will see you in 6 months.   

## 2018-07-10 ENCOUNTER — Telehealth: Payer: Self-pay | Admitting: *Deleted

## 2018-07-10 MED ORDER — GLUCOSE BLOOD VI STRP: ORAL_STRIP | 3 refills | Status: DC

## 2018-07-10 NOTE — Telephone Encounter (Signed)
Corrected amount of test strips sent to [facility] for a 3 mos supply

## 2018-08-12 DIAGNOSIS — H43813 Vitreous degeneration, bilateral: Secondary | ICD-10-CM | POA: Diagnosis not present

## 2018-08-12 DIAGNOSIS — E113513 Type 2 diabetes mellitus with proliferative diabetic retinopathy with macular edema, bilateral: Secondary | ICD-10-CM | POA: Diagnosis not present

## 2018-08-12 DIAGNOSIS — Z961 Presence of intraocular lens: Secondary | ICD-10-CM | POA: Diagnosis not present

## 2018-08-12 DIAGNOSIS — H26491 Other secondary cataract, right eye: Secondary | ICD-10-CM | POA: Diagnosis not present

## 2018-08-13 ENCOUNTER — Encounter: Payer: Self-pay | Admitting: Family Medicine

## 2018-08-13 ENCOUNTER — Ambulatory Visit (INDEPENDENT_AMBULATORY_CARE_PROVIDER_SITE_OTHER): Payer: PPO | Admitting: Family Medicine

## 2018-08-13 VITALS — BP 133/78 | HR 64 | Temp 97.5°F | Ht 66.0 in | Wt 188.0 lb

## 2018-08-13 DIAGNOSIS — Z794 Long term (current) use of insulin: Secondary | ICD-10-CM | POA: Diagnosis not present

## 2018-08-13 DIAGNOSIS — I10 Essential (primary) hypertension: Secondary | ICD-10-CM

## 2018-08-13 DIAGNOSIS — E11649 Type 2 diabetes mellitus with hypoglycemia without coma: Secondary | ICD-10-CM

## 2018-08-13 DIAGNOSIS — E782 Mixed hyperlipidemia: Secondary | ICD-10-CM | POA: Diagnosis not present

## 2018-08-13 DIAGNOSIS — E0840 Diabetes mellitus due to underlying condition with diabetic neuropathy, unspecified: Secondary | ICD-10-CM | POA: Diagnosis not present

## 2018-08-13 DIAGNOSIS — Z23 Encounter for immunization: Secondary | ICD-10-CM

## 2018-08-13 LAB — BAYER DCA HB A1C WAIVED: HB A1C (BAYER DCA - WAIVED): 8 % — ABNORMAL HIGH (ref <7.0)

## 2018-08-13 MED ORDER — INSULIN NPH (HUMAN) (ISOPHANE) 100 UNIT/ML ~~LOC~~ SUSP: SUBCUTANEOUS | 5 refills | Status: DC

## 2018-08-13 MED ORDER — LEVOTHYROXINE SODIUM 50 MCG PO TABS: 50.0000 ug | ORAL_TABLET | Freq: Every day | ORAL | 1 refills | Status: DC

## 2018-08-13 MED ORDER — CANDESARTAN CILEXETIL 16 MG PO TABS: 16.0000 mg | ORAL_TABLET | Freq: Every day | ORAL | 1 refills | Status: DC

## 2018-08-13 MED ORDER — TRAZODONE HCL 150 MG PO TABS: ORAL_TABLET | ORAL | 1 refills | Status: DC

## 2018-08-13 MED ORDER — METFORMIN HCL 1000 MG PO TABS: 1000.0000 mg | ORAL_TABLET | Freq: Two times a day (BID) | ORAL | 1 refills | Status: DC

## 2018-08-13 MED ORDER — ALLOPURINOL 300 MG PO TABS: ORAL_TABLET | ORAL | 1 refills | Status: DC

## 2018-08-13 MED ORDER — GLIPIZIDE 5 MG PO TABS: ORAL_TABLET | ORAL | 1 refills | Status: DC

## 2018-08-13 MED ORDER — ATORVASTATIN CALCIUM 80 MG PO TABS: ORAL_TABLET | ORAL | 1 refills | Status: DC

## 2018-08-13 MED ORDER — GABAPENTIN 300 MG PO CAPS: 300.0000 mg | ORAL_CAPSULE | Freq: Two times a day (BID) | ORAL | 1 refills | Status: DC

## 2018-08-13 MED ORDER — DONEPEZIL HCL 10 MG PO TABS: 10.0000 mg | ORAL_TABLET | Freq: Every day | ORAL | 1 refills | Status: DC

## 2018-08-13 NOTE — Progress Notes (Signed)
Subjective:  Patient ID: Ethan Peters,  male    DOB: 08-30-1956  Age: 62 y.o.    CC: Medical Management of Chronic Issues   HPI Ethan Peters presents for  follow-up of hypertension. Patient has no history of headache chest pain or shortness of breath or recent cough. Patient also denies symptoms of TIA such as numbness weakness lateralizing. Patient denies side effects from medication. States taking it regularly.  Patient also  in for follow-up of elevated cholesterol. Doing well without complaints on current medication. Denies side effects  including myalgia and arthralgia and nausea. Also in today for liver function testing. Currently no chest pain, shortness of breath or other cardiovascular related symptoms noted.  Follow-up of diabetes. Patient does check blood sugar at home. Logs reviewed, sent to scan into chart. Readings run between 130-160 fasting with several higher outliers up to 292 and 175-300 postprandial with lows down to 50 and causing significant nausea, sweating, dizziness, etc. Relief by eating carbs.  Patient denies symptoms such as excessive hunger or urinary frequency, excessive hunger, nausea No significant hypoglycemic spells noted. Medications reviewed. Pt reports taking them regularly. Pt. denies complication/adverse reaction today.    History Ethan Peters has a past medical history of CAD (coronary artery disease) (01/09/13), Diabetes mellitus, Gout, Hypertension, Ischemic cardiomyopathy, Myocardial infarction ([place]) (01/09/2013), and Shortness of breath.   He has a past surgical history that includes Coronary angioplasty with stent (01/09/2013); Cardiac catheterization (01/22/2013); left heart catheterization with coronary angiogram (N/A, 01/09/2013); and left heart catheterization with coronary angiogram (N/A, 01/22/2013).   His family history includes Heart disease in his father and mother.He reports that he has never smoked. He has never used smokeless  tobacco. He reports that he drinks alcohol. He reports that he does not use drugs.  Current Outpatient Medications on File Prior to Visit  Medication Sig Dispense Refill  . aspirin EC 81 MG EC tablet Take 1 tablet (81 mg total) by mouth daily.    . B Complex-C (B-COMPLEX WITH VITAMIN C) tablet Take 1 tablet by mouth as needed (for vitamin deficiency).    . Blood Glucose Monitoring Suppl (BLOOD GLUCOSE MONITOR SYSTEM) w/Device KIT 1 Device by Does not apply route 2 (two) times daily. 1 each 0  . ergocalciferol (VITAMIN D2) 50000 units capsule Take 1 capsule (50,000 Units total) by mouth once a week. 13 capsule 1  . glucose blood (ONETOUCH VERIO) test strip USE TO CHECK GLUCOSE 4 TIMES DAILY 400 each 3  . nitroGLYCERIN (NITROSTAT) 0.4 MG SL tablet Place 1 tablet (0.4 mg total) under the tongue every 5 (five) minutes x 3 doses as needed for chest pain. 25 tablet 11   No current facility-administered medications on file prior to visit.     ROS Review of Systems  Constitutional: Negative.   HENT: Negative.   Eyes: Negative for visual disturbance.  Respiratory: Negative for cough and shortness of breath.   Cardiovascular: Negative for chest pain and leg swelling.  Gastrointestinal: Negative for abdominal pain, diarrhea, nausea and vomiting.  Genitourinary: Negative for difficulty urinating.  Musculoskeletal: Negative for arthralgias and myalgias.  Skin: Negative for rash.  Neurological: Negative for headaches.  Psychiatric/Behavioral: Negative for sleep disturbance.    Objective:  BP 133/78   Pulse 64   Temp (!) 97.5 F (36.4 C) (Oral)   Ht '5\' 6"'  (5.009 m)   Wt 188 lb (85.3 kg)   BMI 30.34 kg/m   BP Readings from Last 3 Encounters:  08/13/18  133/78  07/07/18 130/80  05/09/18 133/82    Wt Readings from Last 3 Encounters:  08/13/18 188 lb (85.3 kg)  07/07/18 175 lb 6.4 oz (79.6 kg)  05/09/18 187 lb (84.8 kg)     Physical Exam  Constitutional: He is oriented to person,  place, and time. He appears well-developed and well-nourished. No distress.  HENT:  Head: Normocephalic and atraumatic.  Right Ear: External ear normal.  Left Ear: External ear normal.  Nose: Nose normal.  Mouth/Throat: Oropharynx is clear and moist.  Eyes: Pupils are equal, round, and reactive to light. Conjunctivae and EOM are normal.  Neck: Normal range of motion. Neck supple.  Cardiovascular: Normal rate, regular rhythm and normal heart sounds.  No murmur heard. Pulmonary/Chest: Effort normal and breath sounds normal. No respiratory distress. He has no wheezes. He has no rales.  Abdominal: Soft. There is no tenderness.  Musculoskeletal: Normal range of motion.  Neurological: He is alert and oriented to person, place, and time. He has normal reflexes.  Skin: Skin is warm and dry.  Psychiatric: He has a normal mood and affect. His behavior is normal. Judgment and thought content normal.    Results for orders placed or performed in visit on 08/13/18  Bayer DCA Hb A1c Waived  Result Value Ref Range   HB A1C (BAYER DCA - WAIVED) 8.0 (H) <7.0 %       Assessment & Plan:   Ethan Peters was seen today for medical management of chronic issues.  Diagnoses and all orders for this visit:  Essential hypertension -     CMP14+EGFR  Mixed hyperlipidemia -     Lipid panel -     Uric Acid  Type 2 diabetes mellitus with hypoglycemia without coma, with long-term current use of insulin (HCC) -     Bayer DCA Hb A1c Waived  Need for immunization against influenza -     Flu Vaccine QUAD 36+ mos IM  Other orders -     traZODone (DESYREL) 150 MG tablet; Take 1/3 tablet to 1 tablet QHS for sleep. -     Discontinue: insulin NPH Human (NOVOLIN N) 100 UNIT/ML injection; Take 40 units before breakfast and supper. -     glipiZIDE (GLUCOTROL) 5 MG tablet; TAKE 1 TABLET BY MOUTH ONCE DAILY BEFORE BREAKFAST -     atorvastatin (LIPITOR) 80 MG tablet; TAKE 1 TABLET BY MOUTH ONCE DAILY AT 6 PM FOR  CHOLESTEROL -     allopurinol (ZYLOPRIM) 300 MG tablet; TAKE ONE TABLET BY MOUTH ONCE DAILY AS NEEDED FOR GOUT PAIN -     metFORMIN (GLUCOPHAGE) 1000 MG tablet; Take 1 tablet (1,000 mg total) by mouth 2 (two) times daily with a meal. -     donepezil (ARICEPT) 10 MG tablet; Take 1 tablet (10 mg total) by mouth at bedtime. -     candesartan (ATACAND) 16 MG tablet; Take 1 tablet (16 mg total) by mouth daily. -     gabapentin (NEURONTIN) 300 MG capsule; Take 1 capsule (300 mg total) by mouth 2 (two) times daily. -     levothyroxine (SYNTHROID, LEVOTHROID) 50 MCG tablet; Take 1 tablet (50 mcg total) by mouth daily. -     Pneumococcal polysaccharide vaccine 23-valent greater than or equal to 2yo subcutaneous/IM -     insulin NPH Human (NOVOLIN N) 100 UNIT/ML injection; Take 35 units before breakfast and supper.   I have discontinued Ethan Peters's insulin NPH Human. I have also changed his insulin NPH  Human. Additionally, I am having him maintain his aspirin, B-complex with vitamin C, nitroGLYCERIN, Blood Glucose Monitor System, ergocalciferol, glucose blood, traZODone, glipiZIDE, atorvastatin, allopurinol, metFORMIN, donepezil, candesartan, gabapentin, and levothyroxine.  Meds ordered this encounter  Medications  . traZODone (DESYREL) 150 MG tablet    Sig: Take 1/3 tablet to 1 tablet QHS for sleep.    Dispense:  90 tablet    Refill:  1  . DISCONTD: insulin NPH Human (NOVOLIN N) 100 UNIT/ML injection    Sig: Take 40 units before breakfast and supper.    Dispense:  30 mL    Refill:  5  . glipiZIDE (GLUCOTROL) 5 MG tablet    Sig: TAKE 1 TABLET BY MOUTH ONCE DAILY BEFORE BREAKFAST    Dispense:  90 tablet    Refill:  1  . atorvastatin (LIPITOR) 80 MG tablet    Sig: TAKE 1 TABLET BY MOUTH ONCE DAILY AT 6 PM FOR CHOLESTEROL    Dispense:  90 tablet    Refill:  1  . allopurinol (ZYLOPRIM) 300 MG tablet    Sig: TAKE ONE TABLET BY MOUTH ONCE DAILY AS NEEDED FOR GOUT PAIN    Dispense:  90 tablet     Refill:  1  . metFORMIN (GLUCOPHAGE) 1000 MG tablet    Sig: Take 1 tablet (1,000 mg total) by mouth 2 (two) times daily with a meal.    Dispense:  180 tablet    Refill:  1    Please consider 90 day supplies to promote better adherence  . donepezil (ARICEPT) 10 MG tablet    Sig: Take 1 tablet (10 mg total) by mouth at bedtime.    Dispense:  90 tablet    Refill:  1  . candesartan (ATACAND) 16 MG tablet    Sig: Take 1 tablet (16 mg total) by mouth daily.    Dispense:  90 tablet    Refill:  1  . gabapentin (NEURONTIN) 300 MG capsule    Sig: Take 1 capsule (300 mg total) by mouth 2 (two) times daily.    Dispense:  180 capsule    Refill:  1  . levothyroxine (SYNTHROID, LEVOTHROID) 50 MCG tablet    Sig: Take 1 tablet (50 mcg total) by mouth daily.    Dispense:  90 tablet    Refill:  1  . insulin NPH Human (NOVOLIN N) 100 UNIT/ML injection    Sig: Take 35 units before breakfast and supper.    Dispense:  30 mL    Refill:  5   Insulin decreased due to hypoglycemia. I do not think that good control with current meds is feasibledue to highs and lows at multiple times. Pt. Cannot afford more meds or more expensive insulin. Declines further treatment, but will work on carbs in diet.  Follow-up: Return in about 3 months (around 11/13/2018).  Claretta Fraise, M.D.

## 2018-08-14 ENCOUNTER — Other Ambulatory Visit: Payer: Self-pay | Admitting: Family Medicine

## 2018-08-14 DIAGNOSIS — E11319 Type 2 diabetes mellitus with unspecified diabetic retinopathy without macular edema: Secondary | ICD-10-CM

## 2018-08-14 LAB — CMP14+EGFR
ALK PHOS: 84 IU/L (ref 39–117)
ALT: 23 IU/L (ref 0–44)
AST: 16 IU/L (ref 0–40)
Albumin/Globulin Ratio: 1.7 (ref 1.2–2.2)
Albumin: 4.5 g/dL (ref 3.6–4.8)
BUN/Creatinine Ratio: 10 (ref 10–24)
BUN: 11 mg/dL (ref 8–27)
Bilirubin Total: 0.5 mg/dL (ref 0.0–1.2)
CO2: 24 mmol/L (ref 20–29)
Calcium: 9.4 mg/dL (ref 8.6–10.2)
Chloride: 102 mmol/L (ref 96–106)
Creatinine, Ser: 1.09 mg/dL (ref 0.76–1.27)
GFR calc Af Amer: 84 mL/min/{1.73_m2} (ref >59)
GFR, EST NON AFRICAN AMERICAN: 72 mL/min/{1.73_m2} (ref >59)
GLOBULIN, TOTAL: 2.6 g/dL (ref 1.5–4.5)
GLUCOSE: 220 mg/dL — AB (ref 65–99)
Potassium: 4.8 mmol/L (ref 3.5–5.2)
SODIUM: 141 mmol/L (ref 134–144)
Total Protein: 7.1 g/dL (ref 6.0–8.5)

## 2018-08-14 LAB — LIPID PANEL
CHOL/HDL RATIO: 2.6 ratio (ref 0.0–5.0)
CHOLESTEROL TOTAL: 121 mg/dL (ref 100–199)
HDL: 46 mg/dL (ref >39)
LDL CALC: 60 mg/dL (ref 0–99)
TRIGLYCERIDES: 74 mg/dL (ref 0–149)
VLDL Cholesterol Cal: 15 mg/dL (ref 5–40)

## 2018-08-14 LAB — URIC ACID: Uric Acid: 6.1 mg/dL (ref 3.7–8.6)

## 2018-10-16 ENCOUNTER — Ambulatory Visit (INDEPENDENT_AMBULATORY_CARE_PROVIDER_SITE_OTHER): Payer: PPO | Admitting: "Endocrinology

## 2018-10-16 ENCOUNTER — Encounter: Payer: Self-pay | Admitting: "Endocrinology

## 2018-10-16 VITALS — BP 118/75 | HR 62 | Ht 66.0 in | Wt 184.0 lb

## 2018-10-16 DIAGNOSIS — E1159 Type 2 diabetes mellitus with other circulatory complications: Secondary | ICD-10-CM | POA: Diagnosis not present

## 2018-10-16 DIAGNOSIS — E782 Mixed hyperlipidemia: Secondary | ICD-10-CM | POA: Diagnosis not present

## 2018-10-16 DIAGNOSIS — E039 Hypothyroidism, unspecified: Secondary | ICD-10-CM

## 2018-10-16 DIAGNOSIS — I10 Essential (primary) hypertension: Secondary | ICD-10-CM

## 2018-10-16 MED ORDER — INSULIN NPH (HUMAN) (ISOPHANE) 100 UNIT/ML ~~LOC~~ SUSP: 40.0000 [IU] | Freq: Two times a day (BID) | SUBCUTANEOUS | 2 refills | Status: DC

## 2018-10-16 NOTE — Progress Notes (Signed)
Endocrinology Consult Note       10/16/2018, 2:08 PM   Subjective:    Patient ID: Ethan Peters, male    DOB: 1956/06/06.  JONAVEN HILGERS is being seen in consultation for management of currently uncontrolled symptomatic diabetes requested by  Claretta Fraise, MD.   Past Medical History:  Diagnosis Date  . CAD (coronary artery disease) 01/09/13   Inferior STEMI s/p DES-RCA  . Diabetes mellitus    Uncontrolled, Hgb A1C 14.8% on 03/14, started on insulin  . Gout   . Hypertension   . Ischemic cardiomyopathy    EF 07-37%, grade 1 diastolic dysfunction, mildly dilated RV, RA at the upper limits of normal, mild TR, PA systolic pressure 32 mm mercury and hypokinesis to akinesis of the basal mid inferior myocardium  . Myocardial infarction ([place]) 01/09/2013  . Shortness of breath    Past Surgical History:  Procedure Laterality Date  . CARDIAC CATHETERIZATION  01/22/2013   Diffuse borderline residual CAD consistent with uncontrolled diabetes, medical management recommended  . CORONARY ANGIOPLASTY WITH STENT PLACEMENT  01/09/2013   30% pLAD, 30% mLAD, 70% pLCx, RCA occlusion at crux with R->L distal collaterals s/p DES; LVEF 50%, moderate-severe HK of inferior basal wall  . LEFT HEART CATHETERIZATION WITH CORONARY ANGIOGRAM N/A 01/09/2013   Procedure: LEFT HEART CATHETERIZATION WITH CORONARY ANGIOGRAM;  Surgeon: Peter M Martinique, MD;  Location: [facility] CATH LAB;  Service: Cardiovascular;  Laterality: N/A;  . LEFT HEART CATHETERIZATION WITH CORONARY ANGIOGRAM N/A 01/22/2013   Procedure: LEFT HEART CATHETERIZATION WITH CORONARY ANGIOGRAM;  Surgeon: Sherren Mocha, MD;  Location: [facility] CATH LAB;  Service: Cardiovascular;  Laterality: N/A;   Social History   Socioeconomic History  . Marital status: Married    Spouse name: Not on file  . Number of children: Not on file  . Years of education: Not on file  . Highest  education level: Not on file  Occupational History  . Not on file  Social Needs  . Financial resource strain: Not on file  . Food insecurity:    Worry: Not on file    Inability: Not on file  . Transportation needs:    Medical: Not on file    Non-medical: Not on file  Tobacco Use  . Smoking status: Never Smoker  . Smokeless tobacco: Never Used  Substance and Sexual Activity  . Alcohol use: Yes    Comment: i have not drank in a year "  . Drug use: No  . Sexual activity: Not on file  Lifestyle  . Physical activity:    Days per week: Not on file    Minutes per session: Not on file  . Stress: Not on file  Relationships  . Social connections:    Talks on phone: Not on file    Gets together: Not on file    Attends religious service: Not on file    Active member of club or organization: Not on file    Attends meetings of clubs or organizations: Not on file    Relationship status: Not on file  Other Topics Concern  . Not on file  Social  History Narrative   Spanish-speaking. Limited English.    Outpatient Encounter Medications as of 10/16/2018  Medication Sig  . allopurinol (ZYLOPRIM) 300 MG tablet TAKE ONE TABLET BY MOUTH ONCE DAILY AS NEEDED FOR GOUT PAIN  . aspirin EC 81 MG EC tablet Take 1 tablet (81 mg total) by mouth daily.  Marland Kitchen atorvastatin (LIPITOR) 80 MG tablet TAKE 1 TABLET BY MOUTH ONCE DAILY AT 6 PM FOR CHOLESTEROL  . B Complex-C (B-COMPLEX WITH VITAMIN C) tablet Take 1 tablet by mouth as needed (for vitamin deficiency).  . Blood Glucose Monitoring Suppl (BLOOD GLUCOSE MONITOR SYSTEM) w/Device KIT 1 Device by Does not apply route 2 (two) times daily.  . candesartan (ATACAND) 16 MG tablet Take 1 tablet (16 mg total) by mouth daily.  Marland Kitchen donepezil (ARICEPT) 10 MG tablet Take 1 tablet (10 mg total) by mouth at bedtime.  . ergocalciferol (VITAMIN D2) 50000 units capsule Take 1 capsule (50,000 Units total) by mouth once a week.  . gabapentin (NEURONTIN) 300 MG capsule Take 1  capsule (300 mg total) by mouth 2 (two) times daily.  Marland Kitchen glucose blood (ONETOUCH VERIO) test strip USE TO CHECK GLUCOSE 4 TIMES DAILY  . insulin NPH Human (NOVOLIN N) 100 UNIT/ML injection Inject 0.4 mLs (40 Units total) into the skin 2 (two) times daily before a meal. Take 35 units before breakfast and supper.  . levothyroxine (SYNTHROID, LEVOTHROID) 50 MCG tablet Take 1 tablet (50 mcg total) by mouth daily.  . metFORMIN (GLUCOPHAGE) 1000 MG tablet Take 1 tablet (1,000 mg total) by mouth 2 (two) times daily with a meal.  . nitroGLYCERIN (NITROSTAT) 0.4 MG SL tablet Place 1 tablet (0.4 mg total) under the tongue every 5 (five) minutes x 3 doses as needed for chest pain.  . traZODone (DESYREL) 150 MG tablet Take 1/3 tablet to 1 tablet QHS for sleep.  . [DISCONTINUED] glipiZIDE (GLUCOTROL) 5 MG tablet TAKE 1 TABLET BY MOUTH ONCE DAILY BEFORE BREAKFAST  . [DISCONTINUED] insulin NPH Human (NOVOLIN N) 100 UNIT/ML injection Take 35 units before breakfast and supper. (Patient taking differently: 40 Units 2 (two) times daily before a meal. Take 35 units before breakfast and supper.)   No facility-administered encounter medications on file as of 10/16/2018.     ALLERGIES: Allergies  Allergen Reactions  . Levemir [Insulin Detemir] Swelling  . Lisinopril Cough  . Penicillins Rash    Lip swelling    VACCINATION STATUS: Immunization History  Administered Date(s) Administered  . Influenza,inj,Quad PF,6+ Mos 08/22/2015, 07/27/2016, 07/16/2017, 08/13/2018  . Pneumococcal Conjugate-13 11/27/2016  . Pneumococcal Polysaccharide-23 09/25/2007, 08/13/2018  . Tdap 03/31/2012    Diabetes  He presents for his initial diabetic visit. He has type 2 diabetes mellitus. Onset time: He was diagnosed at approximate age of 63 years. His disease course has been fluctuating. Hypoglycemia symptoms include headaches and sweats. Pertinent negatives for hypoglycemia include no confusion, pallor or seizures. Associated  symptoms include polydipsia and polyuria. Pertinent negatives for diabetes include no chest pain, no fatigue, no polyphagia and no weakness. There are no hypoglycemic complications. Symptoms are worsening. Diabetic complications include heart disease and retinopathy. (Patient is status post stent placement in unidentified coronaries in 2014.) Risk factors for coronary artery disease include diabetes mellitus, dyslipidemia, family history, hypertension, male sex and sedentary lifestyle. Current diabetic treatment includes insulin injections and oral agent (monotherapy). His weight is increasing steadily. He is following a generally unhealthy diet. When asked about meal planning, he reported none. He has not had a  previous visit with a dietitian. He participates in exercise intermittently. (Patient did not bring any meter nor logs to review.  His A1c was 8% in October 2019.) An ACE inhibitor/angiotensin II receptor blocker is not being taken. He does not see a podiatrist.Eye exam is current.  Hyperlipidemia  This is a chronic problem. The current episode started more than 1 year ago. The problem is uncontrolled. Exacerbating diseases include diabetes and hypothyroidism. Pertinent negatives include no chest pain, myalgias or shortness of breath. Current antihyperlipidemic treatment includes statins. Risk factors for coronary artery disease include diabetes mellitus, dyslipidemia, family history, male sex, hypertension and a sedentary lifestyle.  Hypertension  This is a chronic problem. The current episode started more than 1 year ago. The problem is controlled. Associated symptoms include headaches and sweats. Pertinent negatives include no chest pain, neck pain, palpitations or shortness of breath. Risk factors for coronary artery disease include dyslipidemia, diabetes mellitus, family history, male gender and sedentary lifestyle. Past treatments include nothing. Hypertensive end-organ damage includes retinopathy.       Review of Systems  Constitutional: Negative for chills, fatigue, fever and unexpected weight change.  HENT: Negative for dental problem, mouth sores and trouble swallowing.   Eyes: Negative for visual disturbance.  Respiratory: Negative for cough, choking, chest tightness, shortness of breath and wheezing.   Cardiovascular: Negative for chest pain, palpitations and leg swelling.  Gastrointestinal: Negative for abdominal distention, abdominal pain, constipation, diarrhea, nausea and vomiting.  Endocrine: Positive for polydipsia and polyuria. Negative for polyphagia.  Genitourinary: Negative for dysuria, flank pain, hematuria and urgency.  Musculoskeletal: Negative for back pain, gait problem, myalgias and neck pain.  Skin: Negative for pallor, rash and wound.  Neurological: Positive for headaches. Negative for seizures, syncope, weakness and numbness.  Psychiatric/Behavioral: Negative for confusion and dysphoric mood.    Objective:    BP 118/75   Pulse 62   Ht '5\' 6"'  (1.676 m)   Wt 184 lb (83.5 kg)   BMI 29.70 kg/m   Wt Readings from Last 3 Encounters:  10/16/18 184 lb (83.5 kg)  08/13/18 188 lb (85.3 kg)  07/07/18 175 lb 6.4 oz (79.6 kg)     Physical Exam Constitutional:      General: He is not in acute distress.    Appearance: He is well-developed.  HENT:     Head: Normocephalic and atraumatic.  Neck:     Musculoskeletal: Normal range of motion and neck supple.     Thyroid: No thyromegaly.     Trachea: No tracheal deviation.  Cardiovascular:     Rate and Rhythm: Normal rate.     Pulses:          Dorsalis pedis pulses are 1+ on the right side and 1+ on the left side.       Posterior tibial pulses are 1+ on the right side and 1+ on the left side.     Heart sounds: Normal heart sounds, S1 normal and S2 normal. No murmur. No gallop.   Pulmonary:     Effort: No respiratory distress.     Breath sounds: Normal breath sounds. No wheezing.  Abdominal:     General:  Bowel sounds are normal. There is no distension.     Palpations: Abdomen is soft.     Tenderness: There is no abdominal tenderness. There is no guarding.  Musculoskeletal:     Right shoulder: He exhibits no swelling and no deformity.  Skin:    General: Skin is warm and dry.  Findings: No rash.     Nails: There is no clubbing.   Neurological:     Mental Status: He is alert and oriented to person, place, and time.     Cranial Nerves: No cranial nerve deficit.     Sensory: No sensory deficit.     Gait: Gait normal.     Deep Tendon Reflexes: Reflexes are normal and symmetric.  Psychiatric:        Speech: Speech normal.        Behavior: Behavior normal. Behavior is cooperative.        Thought Content: Thought content normal.        Judgment: Judgment normal.       CMP ( most recent) CMP     Component Value Date/Time   NA 141 08/13/2018 1031   K 4.8 08/13/2018 1031   CL 102 08/13/2018 1031   CO2 24 08/13/2018 1031   GLUCOSE 220 (H) 08/13/2018 1031   GLUCOSE 154 (H) 06/20/2016 0923   BUN 11 08/13/2018 1031   CREATININE 1.09 08/13/2018 1031   CREATININE 1.19 06/20/2016 0923   CALCIUM 9.4 08/13/2018 1031   PROT 7.1 08/13/2018 1031   ALBUMIN 4.5 08/13/2018 1031   AST 16 08/13/2018 1031   ALT 23 08/13/2018 1031   ALKPHOS 84 08/13/2018 1031   BILITOT 0.5 08/13/2018 1031   GFRNONAA 72 08/13/2018 1031   GFRAA 84 08/13/2018 1031     Diabetic Labs (most recent): Lab Results  Component Value Date   HGBA1C 8.0 (H) 08/13/2018   HGBA1C 8.2 (H) 05/09/2018   HGBA1C 7.0 (H) 02/07/2018     Lipid Panel ( most recent) Lipid Panel     Component Value Date/Time   CHOL 121 08/13/2018 1031   TRIG 74 08/13/2018 1031   TRIG 77 06/01/2013 0841   HDL 46 08/13/2018 1031   HDL 44 06/01/2013 0841   CHOLHDL 2.6 08/13/2018 1031   CHOLHDL 2.9 06/20/2016 0923   VLDL 20 06/20/2016 0923   LDLCALC 60 08/13/2018 1031   LDLCALC 45 06/01/2013 0841      Lab Results  Component Value  Date   TSH 1.510 05/09/2018   TSH 1.130 07/16/2017   TSH 1.41 06/20/2016   TSH 5.120 (H) 11/02/2015   TSH 2.880 09/22/2014   TSH 0.928 01/09/2013   FREET4 1.40 05/09/2018   FREET4 1.43 07/16/2017   FREET4 1.3 06/20/2016      Assessment & Plan:   1. DM type 2 causing vascular disease ([place])  - Lawton E Claytor has currently uncontrolled symptomatic type 2 DM since 63 years of age,  with most recent A1c of 8 %. Recent labs reviewed. - I had a long discussion with him about the progressive nature of diabetes and the pathology behind its complications. -his diabetes is complicated by coronary artery disease which required stent placement, retinopathy and he remains at a high risk for more acute and chronic complications which include CAD, CVA, CKD, retinopathy, and neuropathy. These are all discussed in detail with him.  - I have counseled him on diet management and weight loss, by adopting a carbohydrate restricted/protein rich diet.  - Suggestion is made for him to avoid simple carbohydrates  from his diet including Cakes, Sweet Desserts, Ice Cream, Soda (diet and regular), Sweet Tea, Candies, Chips, Cookies, Store Bought Juices, Alcohol in Excess of  1-2 drinks a day, Artificial Sweeteners,  Coffee Creamer, and "Sugar-free" Products. This will help patient to have more stable blood glucose profile and  potentially avoid unintended weight gain.  - I encouraged him to switch to  unprocessed or minimally processed complex starch and increased protein intake (animal or plant source), fruits, and vegetables.  - he is advised to stick to a routine mealtimes to eat 3 meals  a day and avoid unnecessary snacks ( to snack only to correct hypoglycemia).   - he will be scheduled with Jearld Fenton, RDN, CDE for individualized diabetes education.  - I have approached him with the following individualized plan to manage diabetes and patient agrees:   -He is currently taking Novolin N for cost  reasons and wishes to stay on this product for same reason. -He is advised to continue with adjusted dose of 40 units with breakfast and 40 units with supper for pre-meal blood glucose readings above 90 mg/dL,  associated with strict monitoring of glucose 4 times a day-before meals and at bedtime. - he is warned not to take insulin without proper monitoring per orders. - he is encouraged to call clinic for blood glucose levels less than 70 or above 300 mg /dl. - he is advised to continue forming 1000 mg p.o. twice daily, therapeutically suitable for patient . - his glipizide will be discontinued, risk outweighs benefit for this patient.  - he will be considered for incretin therapy as appropriate next visit.  - Patient specific target  A1c;  LDL, HDL, Triglycerides, and  Waist Circumference were discussed in detail.  2) Blood Pressure /Hypertension:  his blood pressure is controlled to target.   he is advised to continue his current medications including candesartan 16 mg p.o. daily with breakfast . 3) Lipids/Hyperlipidemia:   Review of his recent lipid panel showed  controlled  LDL at 60.  he  is advised to continue    atorvastatin 80 mg daily at bedtime.  Side effects and precautions discussed with him.  4)  Weight/Diet:  Body mass index is 29.7 kg/m.  - clearly complicating his diabetes care.  I discussed with him the fact that loss of 5 - 10% of his  current body weight will have the most impact on his diabetes management.  CDE Consult will be initiated . Exercise, and detailed carbohydrates information provided  -  detailed on discharge instructions.  5) Hypothyroidism -He does not have recent thyroid function tests.  He is on levothyroxine 50 mcg p.o. daily.  - We discussed about correct intake of levothyroxine, at fasting, with water, separated by at least 30 minutes from breakfast, and separated by more than 4 hours from calcium, iron, multivitamins, acid reflux medications  (PPIs). -Patient is made aware of the fact that thyroid hormone replacement is needed for life, dose to be adjusted by periodic monitoring of thyroid function tests.    6) Chronic Care/Health Maintenance:  -he  is on ACEI/ARB and Statin medications and  is encouraged to initiate and continue to follow up with Ophthalmology, Dentist,  Podiatrist at least yearly or according to recommendations, and advised to  stay away from smoking. I have recommended yearly flu vaccine and pneumonia vaccine at least every 5 years; moderate intensity exercise for up to 150 minutes weekly; and  sleep for at least 7 hours a day.  - he is  advised to maintain close follow up with Claretta Fraise, MD for primary care needs, as well as his other providers for optimal and coordinated care.  - Time spent with the patient: 35 minutes, of which >50% was spent in obtaining information about his  symptoms, reviewing his previous labs, evaluations, and treatments, counseling him about his currently uncontrolled, complicated type 2 diabetes; hypothyroidism, and developing plans for long term treatment based on the latest recommendations.  [name] participated in the discussions, expressed understanding, and voiced agreement with the above plans.  All questions were answered to his satisfaction. he is encouraged to contact clinic should he have any questions or concerns prior to his return visit.  Follow up plan: - Return in about 10 days (around 10/26/2018) for Follow up with [facility] and Logs Only - no Labs.  Glade Lloyd, MD [facility] [address] [place], [place] [address] Phone: [phone]  Fax: [phone]    10/16/2018, 2:08 PM  This note was partially dictated with voice recognition software. Similar sounding words can be transcribed inadequately or may not  be corrected upon review.

## 2018-10-16 NOTE — Patient Instructions (Signed)

## 2018-10-29 DIAGNOSIS — E113512 Type 2 diabetes mellitus with proliferative diabetic retinopathy with macular edema, left eye: Secondary | ICD-10-CM | POA: Diagnosis not present

## 2018-10-29 DIAGNOSIS — H40052 Ocular hypertension, left eye: Secondary | ICD-10-CM | POA: Diagnosis not present

## 2018-10-29 DIAGNOSIS — E113591 Type 2 diabetes mellitus with proliferative diabetic retinopathy without macular edema, right eye: Secondary | ICD-10-CM | POA: Diagnosis not present

## 2018-10-29 LAB — HM DIABETES EYE EXAM

## 2018-10-30 ENCOUNTER — Ambulatory Visit (INDEPENDENT_AMBULATORY_CARE_PROVIDER_SITE_OTHER): Payer: PPO | Admitting: "Endocrinology

## 2018-10-30 ENCOUNTER — Encounter: Payer: Self-pay | Admitting: "Endocrinology

## 2018-10-30 VITALS — BP 113/68 | HR 69 | Ht 66.0 in | Wt 186.0 lb

## 2018-10-30 DIAGNOSIS — E782 Mixed hyperlipidemia: Secondary | ICD-10-CM | POA: Diagnosis not present

## 2018-10-30 DIAGNOSIS — E1159 Type 2 diabetes mellitus with other circulatory complications: Secondary | ICD-10-CM | POA: Diagnosis not present

## 2018-10-30 DIAGNOSIS — I10 Essential (primary) hypertension: Secondary | ICD-10-CM

## 2018-10-30 MED ORDER — INSULIN NPH (HUMAN) (ISOPHANE) 100 UNIT/ML ~~LOC~~ SUSP: 30.0000 [IU] | Freq: Two times a day (BID) | SUBCUTANEOUS | 2 refills | Status: DC

## 2018-10-30 NOTE — Progress Notes (Signed)
Endocrinology Consult Note       10/30/2018, 12:59 PM   Subjective:    Patient ID: Ethan Peters, male    DOB: November 09, 1955.  Ethan Peters is being seen in consultation for management of currently uncontrolled symptomatic diabetes requested by  Claretta Fraise, MD.   Past Medical History:  Diagnosis Date  . CAD (coronary artery disease) 01/09/13   Inferior STEMI s/p DES-RCA  . Diabetes mellitus    Uncontrolled, Hgb A1C 14.8% on 03/14, started on insulin  . Gout   . Hypertension   . Ischemic cardiomyopathy    EF 29-51%, grade 1 diastolic dysfunction, mildly dilated RV, RA at the upper limits of normal, mild TR, PA systolic pressure 32 mm mercury and hypokinesis to akinesis of the basal mid inferior myocardium  . Myocardial infarction ([place]) 01/09/2013  . Shortness of breath    Past Surgical History:  Procedure Laterality Date  . CARDIAC CATHETERIZATION  01/22/2013   Diffuse borderline residual CAD consistent with uncontrolled diabetes, medical management recommended  . CORONARY ANGIOPLASTY WITH STENT PLACEMENT  01/09/2013   30% pLAD, 30% mLAD, 70% pLCx, RCA occlusion at crux with R->L distal collaterals s/p DES; LVEF 50%, moderate-severe HK of inferior basal wall  . LEFT HEART CATHETERIZATION WITH CORONARY ANGIOGRAM N/A 01/09/2013   Procedure: LEFT HEART CATHETERIZATION WITH CORONARY ANGIOGRAM;  Surgeon: Peter M Martinique, MD;  Location: [facility] CATH LAB;  Service: Cardiovascular;  Laterality: N/A;  . LEFT HEART CATHETERIZATION WITH CORONARY ANGIOGRAM N/A 01/22/2013   Procedure: LEFT HEART CATHETERIZATION WITH CORONARY ANGIOGRAM;  Surgeon: Sherren Mocha, MD;  Location: [facility] CATH LAB;  Service: Cardiovascular;  Laterality: N/A;   Social History   Socioeconomic History  . Marital status: Married    Spouse name: Not on file  . Number of children: Not on file  . Years of education: Not on file  . Highest  education level: Not on file  Occupational History  . Not on file  Social Needs  . Financial resource strain: Not on file  . Food insecurity:    Worry: Not on file    Inability: Not on file  . Transportation needs:    Medical: Not on file    Non-medical: Not on file  Tobacco Use  . Smoking status: Never Smoker  . Smokeless tobacco: Never Used  Substance and Sexual Activity  . Alcohol use: Yes    Comment: i have not drank in a year "  . Drug use: No  . Sexual activity: Not on file  Lifestyle  . Physical activity:    Days per week: Not on file    Minutes per session: Not on file  . Stress: Not on file  Relationships  . Social connections:    Talks on phone: Not on file    Gets together: Not on file    Attends religious service: Not on file    Active member of club or organization: Not on file    Attends meetings of clubs or organizations: Not on file    Relationship status: Not on file  Other Topics Concern  . Not on file  Social  History Narrative   Spanish-speaking. Limited English.    Outpatient Encounter Medications as of 10/30/2018  Medication Sig  . allopurinol (ZYLOPRIM) 300 MG tablet TAKE ONE TABLET BY MOUTH ONCE DAILY AS NEEDED FOR GOUT PAIN  . aspirin EC 81 MG EC tablet Take 1 tablet (81 mg total) by mouth daily.  Marland Kitchen atorvastatin (LIPITOR) 80 MG tablet TAKE 1 TABLET BY MOUTH ONCE DAILY AT 6 PM FOR CHOLESTEROL  . B Complex-C (B-COMPLEX WITH VITAMIN C) tablet Take 1 tablet by mouth as needed (for vitamin deficiency).  . Blood Glucose Monitoring Suppl (BLOOD GLUCOSE MONITOR SYSTEM) w/Device KIT 1 Device by Does not apply route 2 (two) times daily.  . candesartan (ATACAND) 16 MG tablet Take 1 tablet (16 mg total) by mouth daily.  Marland Kitchen donepezil (ARICEPT) 10 MG tablet Take 1 tablet (10 mg total) by mouth at bedtime.  . ergocalciferol (VITAMIN D2) 50000 units capsule Take 1 capsule (50,000 Units total) by mouth once a week.  . gabapentin (NEURONTIN) 300 MG capsule Take 1  capsule (300 mg total) by mouth 2 (two) times daily.  Marland Kitchen glucose blood (ONETOUCH VERIO) test strip USE TO CHECK GLUCOSE 4 TIMES DAILY  . insulin NPH Human (NOVOLIN N) 100 UNIT/ML injection Inject 0.3 mLs (30 Units total) into the skin 2 (two) times daily before a meal. Take 35 units before breakfast and supper.  . levothyroxine (SYNTHROID, LEVOTHROID) 50 MCG tablet Take 1 tablet (50 mcg total) by mouth daily.  . metFORMIN (GLUCOPHAGE) 1000 MG tablet Take 1 tablet (1,000 mg total) by mouth 2 (two) times daily with a meal.  . nitroGLYCERIN (NITROSTAT) 0.4 MG SL tablet Place 1 tablet (0.4 mg total) under the tongue every 5 (five) minutes x 3 doses as needed for chest pain.  . traZODone (DESYREL) 150 MG tablet Take 1/3 tablet to 1 tablet QHS for sleep.  . [DISCONTINUED] insulin NPH Human (NOVOLIN N) 100 UNIT/ML injection Inject 0.4 mLs (40 Units total) into the skin 2 (two) times daily before a meal. Take 35 units before breakfast and supper.   No facility-administered encounter medications on file as of 10/30/2018.     ALLERGIES: Allergies  Allergen Reactions  . Levemir [Insulin Detemir] Swelling  . Lisinopril Cough  . Penicillins Rash    Lip swelling    VACCINATION STATUS: Immunization History  Administered Date(s) Administered  . Influenza,inj,Quad PF,6+ Mos 08/22/2015, 07/27/2016, 07/16/2017, 08/13/2018  . Pneumococcal Conjugate-13 11/27/2016  . Pneumococcal Polysaccharide-23 09/25/2007, 08/13/2018  . Tdap 03/31/2012    Diabetes  He presents for his follow-up diabetic visit. He has type 2 diabetes mellitus. Onset time: He was diagnosed at approximate age of 63 years. His disease course has been improving. Hypoglycemia symptoms include headaches and sweats. Pertinent negatives for hypoglycemia include no confusion, pallor or seizures. Pertinent negatives for diabetes include no chest pain, no fatigue, no polydipsia, no polyphagia, no polyuria and no weakness. There are no hypoglycemic  complications. Symptoms are improving. Diabetic complications include heart disease and retinopathy. (Patient is status post stent placement in unidentified coronaries in 2014.) Risk factors for coronary artery disease include diabetes mellitus, dyslipidemia, family history, hypertension, male sex and sedentary lifestyle. Current diabetic treatment includes insulin injections and oral agent (monotherapy). His weight is stable. He is following a generally unhealthy diet. When asked about meal planning, he reported none. He has not had a previous visit with a dietitian. He participates in exercise intermittently. His breakfast blood glucose range is generally 140-180 mg/dl. His lunch blood glucose  range is generally 140-180 mg/dl. His dinner blood glucose range is generally 140-180 mg/dl. His bedtime blood glucose range is generally 130-140 mg/dl. His overall blood glucose range is 140-180 mg/dl. An ACE inhibitor/angiotensin II receptor blocker is not being taken. He does not see a podiatrist.Eye exam is current.  Hyperlipidemia  This is a chronic problem. The current episode started more than 1 year ago. The problem is uncontrolled. Exacerbating diseases include diabetes and hypothyroidism. Pertinent negatives include no chest pain, myalgias or shortness of breath. Current antihyperlipidemic treatment includes statins. Risk factors for coronary artery disease include diabetes mellitus, dyslipidemia, family history, male sex, hypertension and a sedentary lifestyle.  Hypertension  This is a chronic problem. The current episode started more than 1 year ago. The problem is controlled. Associated symptoms include headaches and sweats. Pertinent negatives include no chest pain, neck pain, palpitations or shortness of breath. Risk factors for coronary artery disease include dyslipidemia, diabetes mellitus, family history, male gender and sedentary lifestyle. Past treatments include nothing. Hypertensive end-organ damage  includes retinopathy.      Review of Systems  Constitutional: Negative for chills, fatigue, fever and unexpected weight change.  HENT: Negative for dental problem, mouth sores and trouble swallowing.   Eyes: Negative for visual disturbance.  Respiratory: Negative for cough, choking, chest tightness, shortness of breath and wheezing.   Cardiovascular: Negative for chest pain, palpitations and leg swelling.  Gastrointestinal: Negative for abdominal distention, abdominal pain, constipation, diarrhea, nausea and vomiting.  Endocrine: Negative for polydipsia, polyphagia and polyuria.  Genitourinary: Negative for dysuria, flank pain, hematuria and urgency.  Musculoskeletal: Negative for back pain, gait problem, myalgias and neck pain.  Skin: Negative for pallor, rash and wound.  Neurological: Positive for headaches. Negative for seizures, syncope, weakness and numbness.  Psychiatric/Behavioral: Negative for confusion and dysphoric mood.    Objective:    BP 113/68   Pulse 69   Ht '5\' 6"'  (1.676 m)   Wt 186 lb (84.4 kg)   BMI 30.02 kg/m   Wt Readings from Last 3 Encounters:  10/30/18 186 lb (84.4 kg)  10/16/18 184 lb (83.5 kg)  08/13/18 188 lb (85.3 kg)     Physical Exam Constitutional:      General: He is not in acute distress.    Appearance: He is well-developed.  HENT:     Head: Normocephalic and atraumatic.  Neck:     Musculoskeletal: Normal range of motion and neck supple.     Thyroid: No thyromegaly.     Trachea: No tracheal deviation.  Cardiovascular:     Pulses:          Dorsalis pedis pulses are 1+ on the right side and 1+ on the left side.       Posterior tibial pulses are 1+ on the right side and 1+ on the left side.     Heart sounds: S1 normal and S2 normal. No murmur. No gallop.   Pulmonary:     Effort: Pulmonary effort is normal. No respiratory distress.     Breath sounds: No wheezing.  Abdominal:     General: There is no distension.     Tenderness: There is  no abdominal tenderness. There is no guarding.  Musculoskeletal:     Right shoulder: He exhibits no swelling and no deformity.  Skin:    General: Skin is warm and dry.     Findings: No rash.     Nails: There is no clubbing.   Neurological:     Mental  Status: He is alert and oriented to person, place, and time.     Cranial Nerves: No cranial nerve deficit.     Sensory: No sensory deficit.     Gait: Gait normal.     Deep Tendon Reflexes: Reflexes are normal and symmetric.  Psychiatric:        Speech: Speech normal.        Behavior: Behavior normal. Behavior is cooperative.        Thought Content: Thought content normal.        Judgment: Judgment normal.       CMP ( most recent) CMP     Component Value Date/Time   NA 141 08/13/2018 1031   K 4.8 08/13/2018 1031   CL 102 08/13/2018 1031   CO2 24 08/13/2018 1031   GLUCOSE 220 (H) 08/13/2018 1031   GLUCOSE 154 (H) 06/20/2016 0923   BUN 11 08/13/2018 1031   CREATININE 1.09 08/13/2018 1031   CREATININE 1.19 06/20/2016 0923   CALCIUM 9.4 08/13/2018 1031   PROT 7.1 08/13/2018 1031   ALBUMIN 4.5 08/13/2018 1031   AST 16 08/13/2018 1031   ALT 23 08/13/2018 1031   ALKPHOS 84 08/13/2018 1031   BILITOT 0.5 08/13/2018 1031   GFRNONAA 72 08/13/2018 1031   GFRAA 84 08/13/2018 1031     Diabetic Labs (most recent): Lab Results  Component Value Date   HGBA1C 8.0 (H) 08/13/2018   HGBA1C 8.2 (H) 05/09/2018   HGBA1C 7.0 (H) 02/07/2018     Lipid Panel ( most recent) Lipid Panel     Component Value Date/Time   CHOL 121 08/13/2018 1031   TRIG 74 08/13/2018 1031   TRIG 77 06/01/2013 0841   HDL 46 08/13/2018 1031   HDL 44 06/01/2013 0841   CHOLHDL 2.6 08/13/2018 1031   CHOLHDL 2.9 06/20/2016 0923   VLDL 20 06/20/2016 0923   LDLCALC 60 08/13/2018 1031   LDLCALC 45 06/01/2013 0841      Lab Results  Component Value Date   TSH 1.510 05/09/2018   TSH 1.130 07/16/2017   TSH 1.41 06/20/2016   TSH 5.120 (H) 11/02/2015   TSH  2.880 09/22/2014   TSH 0.928 01/09/2013   FREET4 1.40 05/09/2018   FREET4 1.43 07/16/2017   FREET4 1.3 06/20/2016      Assessment & Plan:   1. DM type 2 causing vascular disease ([place])  - Ethan Peters has currently uncontrolled symptomatic type 2 DM since 63 years of age.  He returns with significantly improved glycemic profile, his recent A1c was 8%. -He has had tight blood glucose readings in the evening hours. - I had a long discussion with him about the progressive nature of diabetes and the pathology behind its complications. -his diabetes is complicated by coronary artery disease which required stent placement, retinopathy and he remains at a high risk for more acute and chronic complications which include CAD, CVA, CKD, retinopathy, and neuropathy. These are all discussed in detail with him.  - I have counseled him on diet management and weight loss, by adopting a carbohydrate restricted/protein rich diet.  -  Suggestion is made for him to avoid simple carbohydrates  from his diet including Cakes, Sweet Desserts / Pastries, Ice Cream, Soda (diet and regular), Sweet Tea, Candies, Chips, Cookies, Store Bought Juices, Alcohol in Excess of  1-2 drinks a day, Artificial Sweeteners, and "Sugar-free" Products. This will help patient to have stable blood glucose profile and potentially avoid unintended weight gain.  - I  encouraged him to switch to  unprocessed or minimally processed complex starch and increased protein intake (animal or plant source), fruits, and vegetables.  - he is advised to stick to a routine mealtimes to eat 3 meals  a day and avoid unnecessary snacks ( to snack only to correct hypoglycemia).   - he is  scheduled with Jearld Fenton, RDN, CDE for individualized diabetes education.  - I have approached him with the following individualized plan to manage diabetes and patient agrees:   -He is currently taking Novolin N for cost reasons and wishes to stay on this  product for same reason. - Due to tight BG readings, he is advised to lower NPH to 30 units with breakfast and 30 units with supper for pre-meal blood glucose readings above 90 mg/dL,  associated with strict monitoring of glucose 4 times a day-before meals and at bedtime. - he is warned not to take insulin without proper monitoring per orders. - he is encouraged to call clinic for blood glucose levels less than 70 or above 300 mg /dl. - he is advised to continue forming 1000 mg p.o. twice daily, therapeutically suitable for patient .  - he will be considered for incretin therapy as appropriate next visit.  - Patient specific target  A1c;  LDL, HDL, Triglycerides, and  Waist Circumference were discussed in detail.  2) Blood Pressure /Hypertension:  his blood pressure is controlled to target.  he is advised to continue his current medications including candesartan 16 mg p.o. daily with breakfast . 3) Lipids/Hyperlipidemia:   Review of his recent lipid panel showed  controlled  LDL at 60.  he  is advised to continue atorvastatin 80 mg daily at bedtime.  Side effects and precautions discussed with him.  4)  Weight/Diet:  Body mass index is 30.02 kg/m.  - clearly complicating his diabetes care.  I discussed with him the fact that loss of 5 - 10% of his  current body weight will have the most impact on his diabetes management.  CDE Consult will be initiated . Exercise, and detailed carbohydrates information provided  -  detailed on discharge instructions.  5) Hypothyroidism -He does not have recent thyroid function tests.  We will have thyroid function tests along with his next labs.  He is on levothyroxine 50 mcg p.o. daily before breakfast.  - We discussed about correct intake of levothyroxine, at fasting, with water, separated by at least 30 minutes from breakfast, and separated by more than 4 hours from calcium, iron, multivitamins, acid reflux medications (PPIs). -Patient is made aware of the fact  that thyroid hormone replacement is needed for life, dose to be adjusted by periodic monitoring of thyroid function tests.   6) Chronic Care/Health Maintenance:  -he  is on ACEI/ARB and Statin medications and  is encouraged to initiate and continue to follow up with Ophthalmology, Dentist,  Podiatrist at least yearly or according to recommendations, and advised to  stay away from smoking. I have recommended yearly flu vaccine and pneumonia vaccine at least every 5 years; moderate intensity exercise for up to 150 minutes weekly; and  sleep for at least 7 hours a day.  - he is  advised to maintain close follow up with Claretta Fraise, MD for primary care needs, as well as his other providers for optimal and coordinated care.  - Time spent with the patient: 25 min, of which >50% was spent in reviewing his blood glucose logs , discussing his hypo- and hyper-glycemic  episodes, reviewing his current and  previous labs and insulin doses and developing a plan to avoid hypo- and hyper-glycemia. Please refer to Patient Instructions for Blood Glucose Monitoring and Insulin/Medications Dosing Guide"  in media tab for additional information. Ethan Peters participated in the discussions, expressed understanding, and voiced agreement with the above plans.  All questions were answered to his satisfaction. he is encouraged to contact clinic should he have any questions or concerns prior to his return visit.   Follow up plan: - Return for Meter, and Logs, Follow up with Pre-visit Labs, Meter, and Logs.  Glade Lloyd, MD [facility] [address] [place], [place] [address] Phone: [phone]  Fax: [phone]    10/30/2018, 12:59 PM  This note was partially dictated with voice recognition software. Similar sounding words can be transcribed inadequately or may not  be corrected upon review.

## 2018-10-30 NOTE — Patient Instructions (Signed)

## 2018-11-10 ENCOUNTER — Other Ambulatory Visit: Payer: Self-pay | Admitting: Pharmacist

## 2018-11-10 NOTE — Patient Outreach (Signed)
[facility]) Care Management  11/10/2018  Ethan Peters Mar 15, 1956 [id number]   Call to follow up with patient's endocrinologist, Dr. Fransico Him, regarding patient's Vitamin D level and to let the provider know that patient reports that he is starting to take the ergocalciferol prescription, as on patient's medication list in chart, originally prescribed by Dr. Darlyn Read in May. Leave a message requesting a call back.  Duanne Moron, PharmD, [facility] Clinical Pharmacist [facility] Care Management [phone]

## 2018-11-10 NOTE — Patient Outreach (Addendum)
[facility]) Care Management  11/10/2018  Ethan Peters 1955-12-23 [id number]   Incoming call from Ethan Peters in response to the [facility] Medication Adherence Campaign. Speak with patient. HIPAA identifiers verified and verbal consent received.  Medication Adherence  Ethan Peters reports that he takes his atorvastatin 80 mg once daily as directed. Counsel patient on the importance of adherence to this medication. Reports that he does not currently use a weekly pillbox to organize his medications. Counsel patient on the benefit of using this aid.  Medication Management  Patient reports that he is going to start taking ergocalciferol 50,000 units once weekly. Reports that he is planning to pick it up from his [facility] today. Note that per chart, patient was prescribed the ergocalciferol by his PCP in May 2019 following a low Vitamin D lab result from 02/07/18. Patient reports that he never started taking the Vitamin D at that time, but was instructed to start taking it now. Note that per chart, patient was seen by his endocrinologist, Dr. Fransico Him on 10/30/2018 and the provider ordered a Vitamin D lab, but lab does not appear to have been completed.   PLAN  Will call to follow up with patient's endocrinologist, Dr. Fransico Him, regarding patient's Vitamin D level and to let the provider know that patient reports that he is starting to take the ergocalciferol prescription, as on patient's medication list in chart, originally prescribed by Dr. Darlyn Read in May.  Duanne Moron, PharmD, [facility] Clinical Pharmacist [facility] Care Management [phone]

## 2018-11-10 NOTE — Patient Outreach (Signed)
[facility]) Care Management  11/10/2018  MADOXX HANKES 1956/08/26 [id number]   Receive a call back from Sheppard Penton in Dr. Isidoro Donning office. Sheppard Penton checks with Dr. Fransico Him and reports that per provider, patient has been instructed to take the Vitamin D and that the provider will draw another follow up Vitamin D level prior to patient's next appointment in March.  Call patient's [facility] and speak with [facility]. Request that patient's prescription for ergocalciferol be filled for the patient. Florentina Addison states that the pharmacy will get the prescription ready for the patient today.  Will close pharmacy episode.  Duanne Moron, PharmD, [facility] Clinical Pharmacist [facility] Care Management [phone]

## 2018-11-17 ENCOUNTER — Ambulatory Visit (INDEPENDENT_AMBULATORY_CARE_PROVIDER_SITE_OTHER): Payer: PPO | Admitting: Family Medicine

## 2018-11-17 ENCOUNTER — Encounter: Payer: Self-pay | Admitting: Family Medicine

## 2018-11-17 VITALS — BP 123/70 | HR 67 | Temp 97.2°F | Ht 66.0 in | Wt 193.0 lb

## 2018-11-17 DIAGNOSIS — E039 Hypothyroidism, unspecified: Secondary | ICD-10-CM | POA: Diagnosis not present

## 2018-11-17 DIAGNOSIS — E11649 Type 2 diabetes mellitus with hypoglycemia without coma: Secondary | ICD-10-CM

## 2018-11-17 DIAGNOSIS — H409 Unspecified glaucoma: Secondary | ICD-10-CM | POA: Diagnosis not present

## 2018-11-17 DIAGNOSIS — I10 Essential (primary) hypertension: Secondary | ICD-10-CM | POA: Diagnosis not present

## 2018-11-17 DIAGNOSIS — E782 Mixed hyperlipidemia: Secondary | ICD-10-CM | POA: Diagnosis not present

## 2018-11-17 DIAGNOSIS — Z794 Long term (current) use of insulin: Secondary | ICD-10-CM | POA: Diagnosis not present

## 2018-11-17 MED ORDER — LEVOTHYROXINE SODIUM 50 MCG PO TABS: 50.0000 ug | ORAL_TABLET | Freq: Every day | ORAL | 1 refills | Status: DC

## 2018-11-17 MED ORDER — CANDESARTAN CILEXETIL 16 MG PO TABS: 16.0000 mg | ORAL_TABLET | Freq: Every day | ORAL | 1 refills | Status: DC

## 2018-11-17 MED ORDER — ATORVASTATIN CALCIUM 80 MG PO TABS: ORAL_TABLET | ORAL | 1 refills | Status: DC

## 2018-11-17 MED ORDER — TRAZODONE HCL 150 MG PO TABS: ORAL_TABLET | ORAL | 1 refills | Status: DC

## 2018-11-17 MED ORDER — METFORMIN HCL 1000 MG PO TABS: 1000.0000 mg | ORAL_TABLET | Freq: Two times a day (BID) | ORAL | 1 refills | Status: DC

## 2018-11-17 MED ORDER — GABAPENTIN 300 MG PO CAPS: 300.0000 mg | ORAL_CAPSULE | Freq: Two times a day (BID) | ORAL | 1 refills | Status: DC

## 2018-11-17 MED ORDER — DONEPEZIL HCL 10 MG PO TABS: 10.0000 mg | ORAL_TABLET | Freq: Every day | ORAL | 1 refills | Status: DC

## 2018-11-17 MED ORDER — ALLOPURINOL 300 MG PO TABS: ORAL_TABLET | ORAL | 1 refills | Status: DC

## 2018-11-17 NOTE — Progress Notes (Signed)
Subjective:  Patient ID: Ethan Peters,  male    DOB: 1956/05/06  Age: 63 y.o.    CC: Medical Management of Chronic Issues   HPI Ethan Peters presents for  follow-up of hypertension. Patient has no history of headache chest pain or shortness of breath or recent cough. Patient also denies symptoms of TIA such as numbness weakness lateralizing. Patient denies side effects from medication. States taking it regularly.  Patient also  in for follow-up of elevated cholesterol. Doing well without complaints on current medication. Denies side effects  including myalgia and arthralgia and nausea. Also in today for liver function testing. Currently no chest pain, shortness of breath or other cardiovascular related symptoms noted.  Follow-up of diabetes. Patient does check blood sugar at home. Readings run 50-25Patient denies symptoms such as excessive hunger or urinary frequency, excessive hunger, nausea Multiple low readings noted in Nov, Dec., but Dr. Dorris Fetch cut him back on NPH to 30 BID and those resolved in January.  Medications reviewed. Pt reports taking them regularly. Pt. denies complication/adverse reaction today.  Eye exam due in March. Pt. Currently seeing ophthalmology for Glaucoma. Pressure OS as high as 36  Patient presents for follow-up on  thyroid. The patient has a history of hypothyroidism for many years. It has been stable recently. Pt. denies any change in  voice, loss of hair, heat or cold intolerance. Energy level has been adequate to good. Patient denies constipation and diarrhea. No myxedema. Medication is as noted below. Verified that pt is taking it daily on an empty stomach. Well tolerated.   History Ethan Peters has a past medical history of CAD (coronary artery disease) (01/09/13), Diabetes mellitus, Gout, Hypertension, Ischemic cardiomyopathy, Myocardial infarction ([place]) (01/09/2013), and Shortness of breath.   He has a past surgical history that includes Coronary  angioplasty with stent (01/09/2013); Cardiac catheterization (01/22/2013); left heart catheterization with coronary angiogram (N/A, 01/09/2013); and left heart catheterization with coronary angiogram (N/A, 01/22/2013).   His family history includes Heart disease in his father and mother.He reports that he has never smoked. He has never used smokeless tobacco. He reports current alcohol use. He reports that he does not use drugs.  Current Outpatient Medications on File Prior to Visit  Medication Sig Dispense Refill  . aspirin EC 81 MG EC tablet Take 1 tablet (81 mg total) by mouth daily.    . B Complex-C (B-COMPLEX WITH VITAMIN C) tablet Take 1 tablet by mouth as needed (for vitamin deficiency).    . Blood Glucose Monitoring Suppl (BLOOD GLUCOSE MONITOR SYSTEM) w/Device KIT 1 Device by Does not apply route 2 (two) times daily. 1 each 0  . ergocalciferol (VITAMIN D2) 50000 units capsule Take 1 capsule (50,000 Units total) by mouth once a week. 13 capsule 1  . glucose blood (ONETOUCH VERIO) test strip USE TO CHECK GLUCOSE 4 TIMES DAILY 400 each 3  . insulin NPH Human (NOVOLIN N) 100 UNIT/ML injection Inject 0.3 mLs (30 Units total) into the skin 2 (two) times daily before a meal. Take 35 units before breakfast and supper. 10 mL 2  . nitroGLYCERIN (NITROSTAT) 0.4 MG SL tablet Place 1 tablet (0.4 mg total) under the tongue every 5 (five) minutes x 3 doses as needed for chest pain. 25 tablet 11   No current facility-administered medications on file prior to visit.     ROS Review of Systems  Constitutional: Negative.   HENT: Negative.   Eyes: Negative for visual disturbance.  Respiratory: Negative for  cough and shortness of breath.   Cardiovascular: Negative for chest pain and leg swelling.  Gastrointestinal: Negative for abdominal pain, diarrhea, nausea and vomiting.  Genitourinary: Negative for difficulty urinating.  Musculoskeletal: Negative for arthralgias and myalgias.  Skin: Negative for rash.   Neurological: Negative for headaches.  Psychiatric/Behavioral: Negative for sleep disturbance.    Objective:  BP 123/70   Pulse 67   Temp (!) 97.2 F (36.2 C) (Oral)   Ht _0  (1.676 m)   Wt 193 lb (87.5 kg)   BMI 31.15 kg/m   BP Readings from Last 3 Encounters:  11/17/18 123/70  10/30/18 113/68  10/16/18 118/75    Wt Readings from Last 3 Encounters:  11/17/18 193 lb (87.5 kg)  10/30/18 186 lb (84.4 kg)  10/16/18 184 lb (83.5 kg)     Physical Exam Constitutional:      General: He is not in acute distress.    Appearance: He is well-developed.  HENT:     Head: Normocephalic and atraumatic.     Right Ear: External ear normal.     Left Ear: External ear normal.     Nose: Nose normal.  Eyes:     Conjunctiva/sclera: Conjunctivae normal.     Pupils: Pupils are equal, round, and reactive to light.  Neck:     Musculoskeletal: Normal range of motion and neck supple.  Cardiovascular:     Rate and Rhythm: Normal rate and regular rhythm.     Heart sounds: Normal heart sounds. No murmur.  Pulmonary:     Effort: Pulmonary effort is normal. No respiratory distress.     Breath sounds: Normal breath sounds. No wheezing or rales.  Abdominal:     Palpations: Abdomen is soft.     Tenderness: There is no abdominal tenderness.  Musculoskeletal: Normal range of motion.  Skin:    General: Skin is warm and dry.  Neurological:     Mental Status: He is alert and oriented to person, place, and time.     Deep Tendon Reflexes: Reflexes are normal and symmetric.  Psychiatric:        Behavior: Behavior normal.        Thought Content: Thought content normal.        Judgment: Judgment normal.     Diabetic Foot Exam - Simple   Simple Foot Form Diabetic Foot exam was performed with the following findings:  Yes 11/17/2018  8:53 AM  Visual Inspection No deformities, no ulcerations, no other skin breakdown bilaterally:  Yes Sensation Testing Intact to touch and monofilament testing  bilaterally:  Yes Pulse Check Posterior Tibialis and Dorsalis pulse intact bilaterally:  Yes Comments       Assessment & Plan:   Ethan Peters was seen today for medical management of chronic issues.  Diagnoses and all orders for this visit:  Type 2 diabetes mellitus with hypoglycemia without coma, with long-term current use of insulin (HCC) -     CBC with Differential/Platelet -     CMP14+EGFR  Hypothyroidism, unspecified type -     TSH -     T4, Free  Mixed hyperlipidemia  Essential hypertension  Glaucoma of both eyes, unspecified glaucoma type  Other orders -     allopurinol (ZYLOPRIM) 300 MG tablet; TAKE ONE TABLET BY MOUTH ONCE DAILY AS NEEDED FOR GOUT PAIN -     atorvastatin (LIPITOR) 80 MG tablet; TAKE 1 TABLET BY MOUTH ONCE DAILY AT 6 PM FOR CHOLESTEROL -     candesartan (ATACAND) 16  MG tablet; Take 1 tablet (16 mg total) by mouth daily. -     donepezil (ARICEPT) 10 MG tablet; Take 1 tablet (10 mg total) by mouth at bedtime. -     gabapentin (NEURONTIN) 300 MG capsule; Take 1 capsule (300 mg total) by mouth 2 (two) times daily. -     levothyroxine (SYNTHROID, LEVOTHROID) 50 MCG tablet; Take 1 tablet (50 mcg total) by mouth daily. -     metFORMIN (GLUCOPHAGE) 1000 MG tablet; Take 1 tablet (1,000 mg total) by mouth 2 (two) times daily with a meal. -     traZODone (DESYREL) 150 MG tablet; Take 1/3 tablet to 1 tablet QHS for sleep.   I am having Ethan Peters maintain his aspirin, B-complex with vitamin C, nitroGLYCERIN, Blood Glucose Monitor System, ergocalciferol, glucose blood, insulin NPH Human, allopurinol, atorvastatin, candesartan, donepezil, gabapentin, levothyroxine, metFORMIN, and traZODone.  Meds ordered this encounter  Medications  . allopurinol (ZYLOPRIM) 300 MG tablet    Sig: TAKE ONE TABLET BY MOUTH ONCE DAILY AS NEEDED FOR GOUT PAIN    Dispense:  90 tablet    Refill:  1  . atorvastatin (LIPITOR) 80 MG tablet    Sig: TAKE 1 TABLET BY MOUTH ONCE DAILY  AT 6 PM FOR CHOLESTEROL    Dispense:  90 tablet    Refill:  1  . candesartan (ATACAND) 16 MG tablet    Sig: Take 1 tablet (16 mg total) by mouth daily.    Dispense:  90 tablet    Refill:  1  . donepezil (ARICEPT) 10 MG tablet    Sig: Take 1 tablet (10 mg total) by mouth at bedtime.    Dispense:  90 tablet    Refill:  1  . gabapentin (NEURONTIN) 300 MG capsule    Sig: Take 1 capsule (300 mg total) by mouth 2 (two) times daily.    Dispense:  180 capsule    Refill:  1  . levothyroxine (SYNTHROID, LEVOTHROID) 50 MCG tablet    Sig: Take 1 tablet (50 mcg total) by mouth daily.    Dispense:  90 tablet    Refill:  1  . metFORMIN (GLUCOPHAGE) 1000 MG tablet    Sig: Take 1 tablet (1,000 mg total) by mouth 2 (two) times daily with a meal.    Dispense:  180 tablet    Refill:  1    Please consider 90 day supplies to promote better adherence  . traZODone (DESYREL) 150 MG tablet    Sig: Take 1/3 tablet to 1 tablet QHS for sleep.    Dispense:  90 tablet    Refill:  1     Follow-up: Return in about 6 months (around 05/18/2019).  Claretta Fraise, M.D.

## 2018-11-18 ENCOUNTER — Encounter: Payer: Self-pay | Admitting: *Deleted

## 2018-11-18 ENCOUNTER — Ambulatory Visit (INDEPENDENT_AMBULATORY_CARE_PROVIDER_SITE_OTHER): Payer: PPO | Admitting: *Deleted

## 2018-11-18 VITALS — BP 133/70 | HR 69 | Ht 66.0 in | Wt 194.0 lb

## 2018-11-18 DIAGNOSIS — Z1212 Encounter for screening for malignant neoplasm of rectum: Secondary | ICD-10-CM

## 2018-11-18 DIAGNOSIS — Z0000 Encounter for general adult medical examination without abnormal findings: Secondary | ICD-10-CM | POA: Diagnosis not present

## 2018-11-18 DIAGNOSIS — Z1211 Encounter for screening for malignant neoplasm of colon: Secondary | ICD-10-CM

## 2018-11-18 LAB — CBC WITH DIFFERENTIAL/PLATELET
BASOS ABS: 0.1 10*3/uL (ref 0.0–0.2)
Basos: 1 %
EOS (ABSOLUTE): 0.2 10*3/uL (ref 0.0–0.4)
Eos: 2 %
HEMOGLOBIN: 12.9 g/dL — AB (ref 13.0–17.7)
Hematocrit: 36.9 % — ABNORMAL LOW (ref 37.5–51.0)
Immature Grans (Abs): 0 10*3/uL (ref 0.0–0.1)
Immature Granulocytes: 0 %
Lymphocytes Absolute: 2.4 10*3/uL (ref 0.7–3.1)
Lymphs: 25 %
MCH: 32.3 pg (ref 26.6–33.0)
MCHC: 35 g/dL (ref 31.5–35.7)
MCV: 92 fL (ref 79–97)
MONOCYTES: 7 %
Monocytes Absolute: 0.6 10*3/uL (ref 0.1–0.9)
Neutrophils Absolute: 6 10*3/uL (ref 1.4–7.0)
Neutrophils: 65 %
Platelets: 192 10*3/uL (ref 150–450)
RBC: 4 x10E6/uL — ABNORMAL LOW (ref 4.14–5.80)
RDW: 12.1 % (ref 11.6–15.4)
WBC: 9.3 10*3/uL (ref 3.4–10.8)

## 2018-11-18 LAB — CMP14+EGFR
ALT: 18 IU/L (ref 0–44)
AST: 14 IU/L (ref 0–40)
Albumin/Globulin Ratio: 1.9 (ref 1.2–2.2)
Albumin: 4.6 g/dL (ref 3.8–4.8)
Alkaline Phosphatase: 80 IU/L (ref 39–117)
BUN/Creatinine Ratio: 17 (ref 10–24)
BUN: 19 mg/dL (ref 8–27)
Bilirubin Total: 0.4 mg/dL (ref 0.0–1.2)
CO2: 23 mmol/L (ref 20–29)
CREATININE: 1.15 mg/dL (ref 0.76–1.27)
Calcium: 9.1 mg/dL (ref 8.6–10.2)
Chloride: 101 mmol/L (ref 96–106)
GFR calc Af Amer: 78 mL/min/{1.73_m2} (ref >59)
GFR calc non Af Amer: 67 mL/min/{1.73_m2} (ref >59)
GLUCOSE: 214 mg/dL — AB (ref 65–99)
Globulin, Total: 2.4 g/dL (ref 1.5–4.5)
Potassium: 5.2 mmol/L (ref 3.5–5.2)
Sodium: 137 mmol/L (ref 134–144)
Total Protein: 7 g/dL (ref 6.0–8.5)

## 2018-11-18 LAB — TSH: TSH: 2.19 u[IU]/mL (ref 0.450–4.500)

## 2018-11-18 LAB — T4, FREE: Free T4: 1.58 ng/dL (ref 0.82–1.77)

## 2018-11-18 NOTE — Patient Instructions (Signed)
Please continue your healthy diet and exercise habits.   Please review the information given on Advance Directives.  If you complete the paperwork, please bring a copy to our office to be filed in your medical record.   Please follow up with Dr. Livia Snellen as scheduled.  Please have your colonoscopy done.    Please continue to move carefully to avoid falls.  Thank you for coming in for your Annual Wellness visit today!   Preventive Care 40-64 Years, Male Preventive care refers to lifestyle choices and visits with your health care provider that can promote health and wellness. What does preventive care include?   A yearly physical exam. This is also called an annual well check.  Dental exams once or twice a year.  Routine eye exams. Ask your health care provider how often you should have your eyes checked.  Personal lifestyle choices, including: ? Daily care of your teeth and gums. ? Regular physical activity. ? Eating a healthy diet. ? Avoiding tobacco and drug use. ? Limiting alcohol use. ? Practicing safe sex. ? Taking low-dose aspirin every day starting at age 85. What happens during an annual well check? The services and screenings done by your health care provider during your annual well check will depend on your age, overall health, lifestyle risk factors, and family history of disease. Counseling Your health care provider may ask you questions about your:  Alcohol use.  Tobacco use.  Drug use.  Emotional well-being.  Home and relationship well-being.  Sexual activity.  Eating habits.  Work and work Statistician. Screening You may have the following tests or measurements:  Height, weight, and BMI.  Blood pressure.  Lipid and cholesterol levels. These may be checked every 5 years, or more frequently if you are over 22 years old.  Skin check.  Lung cancer screening. You may have this screening every year starting at age 31 if you have a 30-pack-year  history of smoking and currently smoke or have quit within the past 15 years.  Colorectal cancer screening. All adults should have this screening starting at age 85 and continuing until age 44. Your health care provider may recommend screening at age 41. You will have tests every 1-10 years, depending on your results and the type of screening test. People at increased risk should start screening at an earlier age. Screening tests may include: ? Guaiac-based fecal occult blood testing. ? Fecal immunochemical test (FIT). ? Stool DNA test. ? Virtual colonoscopy. ? Sigmoidoscopy. During this test, a flexible tube with a tiny camera (sigmoidoscope) is used to examine your rectum and lower colon. The sigmoidoscope is inserted through your anus into your rectum and lower colon. ? Colonoscopy. During this test, a long, thin, flexible tube with a tiny camera (colonoscope) is used to examine your entire colon and rectum.  Prostate cancer screening. Recommendations will vary depending on your family history and other risks.  Hepatitis C blood test.  Hepatitis B blood test.  Sexually transmitted disease (STD) testing.  Diabetes screening. This is done by checking your blood sugar (glucose) after you have not eaten for a while (fasting). You may have this done every 1-3 years. Discuss your test results, treatment options, and if necessary, the need for more tests with your health care provider. Vaccines Your health care provider may recommend certain vaccines, such as:  Influenza vaccine. This is recommended every year.  Tetanus, diphtheria, and acellular pertussis (Tdap, Td) vaccine. You may need a Td booster every 10 years.  Varicella vaccine. You may need this if you have not been vaccinated.  Zoster vaccine. You may need this after age 9.  Measles, mumps, and rubella (MMR) vaccine. You may need at least one dose of MMR if you were born in 1957 or later. You may also need a second  dose.  Pneumococcal 13-valent conjugate (PCV13) vaccine. You may need this if you have certain conditions and have not been vaccinated.  Pneumococcal polysaccharide (PPSV23) vaccine. You may need one or two doses if you smoke cigarettes or if you have certain conditions.  Meningococcal vaccine. You may need this if you have certain conditions.  Hepatitis A vaccine. You may need this if you have certain conditions or if you travel or work in places where you may be exposed to hepatitis A.  Hepatitis B vaccine. You may need this if you have certain conditions or if you travel or work in places where you may be exposed to hepatitis B.  Haemophilus influenzae type b (Hib) vaccine. You may need this if you have certain risk factors. Talk to your health care provider about which screenings and vaccines you need and how often you need them. This information is not intended to replace advice given to you by your health care provider. Make sure you discuss any questions you have with your health care provider. Document Released: 10/28/2015 Document Revised: 11/21/2017 Document Reviewed: 08/02/2015 Elsevier Interactive Patient Education  2019 [place].   Diabetes Mellitus and Nutrition, Adult When you have diabetes (diabetes mellitus), it is very important to have healthy eating habits because your blood sugar (glucose) levels are greatly affected by what you eat and drink. Eating healthy foods in the appropriate amounts, at about the same times every day, can help you:  Control your blood glucose.  Lower your risk of heart disease.  Improve your blood pressure.  Reach or maintain a healthy weight. Every person with diabetes is different, and each person has different needs for a meal plan. Your health care provider may recommend that you work with a diet and nutrition specialist (dietitian) to make a meal plan that is best for you. Your meal plan may vary depending on factors such as:  The  calories you need.  The medicines you take.  Your weight.  Your blood glucose, blood pressure, and cholesterol levels.  Your activity level.  Other health conditions you have, such as heart or kidney disease. How do carbohydrates affect me? Carbohydrates, also called carbs, affect your blood glucose level more than any other type of food. Eating carbs naturally raises the amount of glucose in your blood. Carb counting is a method for keeping track of how many carbs you eat. Counting carbs is important to keep your blood glucose at a healthy level, especially if you use insulin or take certain oral diabetes medicines. It is important to know how many carbs you can safely have in each meal. This is different for every person. Your dietitian can help you calculate how many carbs you should have at each meal and for each snack. Foods that contain carbs include:  Bread, cereal, rice, pasta, and crackers.  Potatoes and corn.  Peas, beans, and lentils.  Milk and yogurt.  Fruit and juice.  Desserts, such as cakes, cookies, ice cream, and candy. How does alcohol affect me? Alcohol can cause a sudden decrease in blood glucose (hypoglycemia), especially if you use insulin or take certain oral diabetes medicines. Hypoglycemia can be a life-threatening condition. Symptoms of  hypoglycemia (sleepiness, dizziness, and confusion) are similar to symptoms of having too much alcohol. If your health care provider says that alcohol is safe for you, follow these guidelines:  Limit alcohol intake to no more than 1 drink per day for nonpregnant women and 2 drinks per day for men. One drink equals 12 oz of beer, 5 oz of wine, or 1 oz of hard liquor.  Do not drink on an empty stomach.  Keep yourself hydrated with water, diet soda, or unsweetened iced tea.  Keep in mind that regular soda, juice, and other mixers may contain a lot of sugar and must be counted as carbs. What are tips for following this  plan?  Reading food labels  Start by checking the serving size on the "Nutrition Facts" label of packaged foods and drinks. The amount of calories, carbs, fats, and other nutrients listed on the label is based on one serving of the item. Many items contain more than one serving per package.  Check the total grams (g) of carbs in one serving. You can calculate the number of servings of carbs in one serving by dividing the total carbs by 15. For example, if a food has 30 g of total carbs, it would be equal to 2 servings of carbs.  Check the number of grams (g) of saturated and trans fats in one serving. Choose foods that have low or no amount of these fats.  Check the number of milligrams (mg) of salt (sodium) in one serving. Most people should limit total sodium intake to less than 2,300 mg per day.  Always check the nutrition information of foods labeled as "low-fat" or "nonfat". These foods may be higher in added sugar or refined carbs and should be avoided.  Talk to your dietitian to identify your daily goals for nutrients listed on the label. Shopping  Avoid buying canned, premade, or processed foods. These foods tend to be high in fat, sodium, and added sugar.  Shop around the outside edge of the grocery store. This includes fresh fruits and vegetables, bulk grains, fresh meats, and fresh dairy. Cooking  Use low-heat cooking methods, such as baking, instead of high-heat cooking methods like deep frying.  Cook using healthy oils, such as olive, canola, or sunflower oil.  Avoid cooking with butter, cream, or high-fat meats. Meal planning  Eat meals and snacks regularly, preferably at the same times every day. Avoid going long periods of time without eating.  Eat foods high in fiber, such as fresh fruits, vegetables, beans, and whole grains. Talk to your dietitian about how many servings of carbs you can eat at each meal.  Eat 4-6 ounces (oz) of lean protein each day, such as lean  meat, chicken, fish, eggs, or tofu. One oz of lean protein is equal to: ? 1 oz of meat, chicken, or fish. ? 1 egg. ?  cup of tofu.  Eat some foods each day that contain healthy fats, such as avocado, nuts, seeds, and fish. Lifestyle  Check your blood glucose regularly.  Exercise regularly as told by your health care provider. This may include: ? 150 minutes of moderate-intensity or vigorous-intensity exercise each week. This could be brisk walking, biking, or water aerobics. ? Stretching and doing strength exercises, such as yoga or weightlifting, at least 2 times a week.  Take medicines as told by your health care provider.  Do not use any products that contain nicotine or tobacco, such as cigarettes and e-cigarettes. If you need help  quitting, ask your health care provider.  Work with a Social worker or diabetes educator to identify strategies to manage stress and any emotional and social challenges. Questions to ask a health care provider  Do I need to meet with a diabetes educator?  Do I need to meet with a dietitian?  What number can I call if I have questions?  When are the best times to check my blood glucose? Where to find more information:  American Diabetes Association: diabetes.org  Academy of Nutrition and Dietetics: www.eatright.[employer] of Diabetes and Digestive and Kidney Diseases (NIH): [employer] Summary  A healthy meal plan will help you control your blood glucose and maintain a healthy lifestyle.  Working with a diet and nutrition specialist (dietitian) can help you make a meal plan that is best for you.  Keep in mind that carbohydrates (carbs) and alcohol have immediate effects on your blood glucose levels. It is important to count carbs and to use alcohol carefully. This information is not intended to replace advice given to you by your health care provider. Make sure you discuss any questions you have with your health care  provider. Document Released: 06/28/2005 Document Revised: 05/01/2017 Document Reviewed: 11/05/2016 Elsevier Interactive Patient Education  2019 [employer].

## 2018-11-18 NOTE — Progress Notes (Signed)
Subjective:   Ethan Peters is a 63 y.o. male who presents for a Initial Medicare Annual Wellness Visit.  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  He enjoys going target shooting.  He lives at home with his wife and 72 year old son.  Ethan Peters states he has 16 children - living in [place], [place], [place] and [place].  He states he has 9 grand children.    Patient Care Team: Claretta Fraise, MD as PCP - General (Family Medicine) Martinique, Peter M, MD as PCP - Cardiology (Cardiology) Cassandria Anger, MD as Consulting Physician (Endocrinology) Sherlynn Stalls, MD as Consulting Physician (Ophthalmology)  Hospitalizations, surgeries, and ER visits in previous 12 months No hospitalizations, ER visits, or surgeries this past year.   Review of Systems    Patient reports that his overall health is unchanged compared to last year.  Cardiac Risk Factors include: advanced age (>19mn, >>25women);diabetes mellitus;dyslipidemia;hypertension;male gender    All other systems negative       Current Medications (verified) Outpatient Encounter Medications as of 11/18/2018  Medication Sig  . allopurinol (ZYLOPRIM) 300 MG tablet TAKE ONE TABLET BY MOUTH ONCE DAILY AS NEEDED FOR GOUT PAIN  . aspirin EC 81 MG EC tablet Take 1 tablet (81 mg total) by mouth daily.  .Marland Kitchenatorvastatin (LIPITOR) 80 MG tablet TAKE 1 TABLET BY MOUTH ONCE DAILY AT 6 PM FOR CHOLESTEROL  . B Complex-C (B-COMPLEX WITH VITAMIN C) tablet Take 1 tablet by mouth as needed (for vitamin deficiency).  . Blood Glucose Monitoring Suppl (BLOOD GLUCOSE MONITOR SYSTEM) w/Device KIT 1 Device by Does not apply route 2 (two) times daily.  . candesartan (ATACAND) 16 MG tablet Take 1 tablet (16 mg total) by mouth daily.  .Marland Kitchendonepezil (ARICEPT) 10 MG tablet Take 1 tablet (10 mg total) by mouth at bedtime.  . ergocalciferol (VITAMIN D2) 50000 units capsule Take 1  capsule (50,000 Units total) by mouth once a week.  . gabapentin (NEURONTIN) 300 MG capsule Take 1 capsule (300 mg total) by mouth 2 (two) times daily.  .Marland Kitchenglucose blood (ONETOUCH VERIO) test strip USE TO CHECK GLUCOSE 4 TIMES DAILY  . insulin NPH Human (NOVOLIN N) 100 UNIT/ML injection Inject 0.3 mLs (30 Units total) into the skin 2 (two) times daily before a meal. Take 35 units before breakfast and supper.  . levothyroxine (SYNTHROID, LEVOTHROID) 50 MCG tablet Take 1 tablet (50 mcg total) by mouth daily.  . metFORMIN (GLUCOPHAGE) 1000 MG tablet Take 1 tablet (1,000 mg total) by mouth 2 (two) times daily with a meal.  . nitroGLYCERIN (NITROSTAT) 0.4 MG SL tablet Place 1 tablet (0.4 mg total) under the tongue every 5 (five) minutes x 3 doses as needed for chest pain.  . traZODone (DESYREL) 150 MG tablet Take 1/3 tablet to 1 tablet QHS for sleep.   No facility-administered encounter medications on file as of 11/18/2018.     Allergies (verified) Levemir [insulin detemir]; Lisinopril; and Penicillins   History: Past Medical History:  Diagnosis Date  . CAD (coronary artery disease) 01/09/13   Inferior STEMI s/p DES-RCA  . Diabetes mellitus    Uncontrolled, Hgb A1C 14.8% on 03/14, started on insulin  . Gout   . Hypertension   . Ischemic cardiomyopathy    EF 489-16% grade 1 diastolic dysfunction, mildly dilated RV, RA at the upper limits of normal, mild TR,  PA systolic pressure 32 mm mercury and hypokinesis to akinesis of the basal mid inferior myocardium  . Myocardial infarction ([place]) 01/09/2013  . Shortness of breath    Past Surgical History:  Procedure Laterality Date  . CARDIAC CATHETERIZATION  01/22/2013   Diffuse borderline residual CAD consistent with uncontrolled diabetes, medical management recommended  . CORONARY ANGIOPLASTY WITH STENT PLACEMENT  01/09/2013   30% pLAD, 30% mLAD, 70% pLCx, RCA occlusion at crux with R->L distal collaterals s/p DES; LVEF 50%, moderate-severe HK of  inferior basal wall  . LEFT HEART CATHETERIZATION WITH CORONARY ANGIOGRAM N/A 01/09/2013   Procedure: LEFT HEART CATHETERIZATION WITH CORONARY ANGIOGRAM;  Surgeon: Peter M Martinique, MD;  Location: [facility] CATH LAB;  Service: Cardiovascular;  Laterality: N/A;  . LEFT HEART CATHETERIZATION WITH CORONARY ANGIOGRAM N/A 01/22/2013   Procedure: LEFT HEART CATHETERIZATION WITH CORONARY ANGIOGRAM;  Surgeon: Sherren Mocha, MD;  Location: [facility] CATH LAB;  Service: Cardiovascular;  Laterality: N/A;   Family History  Problem Relation Age of Onset  . Heart disease Mother   . Heart disease Father   . Heart disease Brother   . Heart disease Brother    Social History   Socioeconomic History  . Marital status: Married    Spouse name: Not on file  . Number of children: 91  . Years of education: Not on file  . Highest education level: Associate degree: occupational, Hotel manager, or vocational program  Occupational History  . Occupation: disabled    Comment: Furniture conservator/restorer   Social Needs  . Financial resource strain: Not very hard  . Food insecurity:    Worry: Never true    Inability: Never true  . Transportation needs:    Medical: No    Non-medical: No  Tobacco Use  . Smoking status: Never Smoker  . Smokeless tobacco: Never Used  Substance and Sexual Activity  . Alcohol use: Yes    Comment: 4 times per year  . Drug use: No  . Sexual activity: Not on file  Lifestyle  . Physical activity:    Days per week: 5 days    Minutes per session: 60 min  . Stress: Only a little  Relationships  . Social connections:    Talks on phone: Never    Gets together: More than three times a week    Attends religious service: Never    Active member of club or organization: No    Attends meetings of clubs or organizations: Never    Relationship status: Married  Other Topics Concern  . Not on file  Social History Narrative   Spanish-speaking. Limited English.      Clinical Intake:     Pain Score: 0-No pain                   Activities of Daily Living In your present state of health, do you have any difficulty performing the following activities: 11/18/2018  Hearing? N  Vision? Y  Comment Diabetic retinopathy  Difficulty concentrating or making decisions? Y  Comment Trouble remember [employer] or climbing stairs? N  Dressing or bathing? N  Doing errands, shopping? N  Preparing Food and eating ? N  Using the Toilet? N  In the past six months, have you accidently leaked urine? N  Do you have problems with loss of bowel control? N  Managing your Medications? N  Managing your Finances? N  Housekeeping or managing your Housekeeping? N  Some recent data might be hidden  Exercise Current Exercise Habits: Home exercise routine, Type of exercise: walking, Time (Minutes): 60, Frequency (Times/Week): 5, Weekly Exercise (Minutes/Week): 300, Intensity: Moderate, Exercise limited by: cardiac condition(s)  Diet Consumes 3 meals a day and 0 snacks a day.  The patient feels that they mostly follow a Regular, Diabetic diet.  Diet History no problem areas noted.  Patient tries to avoid foods high in sugar and carbohydrates.     Depression Screen PHQ 2/9 Scores 11/18/2018 11/17/2018 08/13/2018 05/09/2018 02/07/2018 11/08/2017 07/16/2017  PHQ - 2 Score 1 1 0 0 0 0 0  PHQ- 9 Score - - - - - - -  Exception Documentation - - - - - - -     Fall Risk Fall Risk  11/18/2018 08/13/2018 05/09/2018 02/07/2018 05/13/2017  Falls in the past year? 0 No No No No  Number falls in past yr: - - - - -     Objective:    Today's Vitals   11/18/18 1417  BP: 133/70  Pulse: 69  Weight: 194 lb (88 kg)  PainSc: 0-No pain   Body mass index is 31.31 kg/m.  Advanced Directives 11/18/2018 11/18/2018 01/21/2013 01/10/2013  Does Patient Have a Medical Advance Directive? - No Patient does not have advance directive Patient does not have advance directive  Would patient like information on creating a medical  advance directive? Yes (MAU/Ambulatory/Procedural Areas - Information given) No - Patient declined - -    Hearing/Vision  No hearing or vision deficits noted during visit. Patient is seen regularly by Dr. Lorrene Reid for care of his diabetic retinopathy.   Cognitive Function: MMSE - Mini Mental State Exam 11/18/2018  Orientation to time 5  Orientation to Place 5  Registration 3  Attention/ Calculation 5  Recall 1  Language- name 2 objects 2  Language- repeat 0  Language- follow 3 step command 3  Language- read & follow direction 1  Write a sentence 1  Copy design 1  Total score 27           Immunizations and Health Maintenance Immunization History  Administered Date(s) Administered  . Influenza,inj,Quad PF,6+ Mos 08/22/2015, 07/27/2016, 07/16/2017, 08/13/2018  . Pneumococcal Conjugate-13 11/27/2016  . Pneumococcal Polysaccharide-23 09/25/2007, 08/13/2018  . Tdap 03/31/2012   Health Maintenance Due  Topic Date Due  . Hepatitis C Screening  Mar 20, 1956  . HIV Screening  08/24/1970  . COLONOSCOPY  08/24/2005  . OPHTHALMOLOGY EXAM  07/24/2015     Health Maintenance  Topic Date Due  . Hepatitis C Screening  10/31/1955  . HIV Screening  08/24/1970  . COLONOSCOPY  08/24/2005  . OPHTHALMOLOGY EXAM  07/24/2015  . HEMOGLOBIN A1C  02/12/2019  . FOOT EXAM  11/18/2019  . TETANUS/TDAP  03/31/2022  . INFLUENZA VACCINE  Completed  . PNEUMOCOCCAL POLYSACCHARIDE VACCINE AGE 68-64 HIGH RISK  Completed   Recommend Hepatitis C and HIV screenings at next visit with Dr. Livia Snellen.  Referral entered for screening colonoscopy.  Confirmed with Dr. Tye Savoy' office that patient was seen 10/29/2018.  Requested notes from this visit.       Assessment:   This is a routine wellness examination for Ethan Peters.    Plan:    Goals    . Patient Stated (pt-stated)     Continue healthy diet and exercise regimen.         Health Maintenance & Additional Screening  Recommendations: Advanced directives: has NO advanced directive  - add't info requested. Referral to SW: no  Lung: Low Dose CT  Chest recommended if Age 40-80 years, 30 pack-year currently smoking OR have quit w/in 15years. Patient does not qualify. Hepatitis C Screening recommended: yes  Today's Orders Orders Placed This Encounter  Procedures  . Ambulatory referral to Gastroenterology    Referral Priority:   Routine    Referral Type:   Consultation    Referral Reason:   Specialty Services Required    Number of Visits Requested:   1    Keep f/u with Claretta Fraise, MD and any other specialty appointments you may have Continue current medications Move carefully to avoid falls. Use assistive devices like a cane or walker if needed. Aim for at least 150 minutes of moderate activity a week. This can be done with chair exercises if necessary. Read or work on puzzles daily Stay connected with friends and family  I have personally reviewed and noted the following in the patient's chart:   . Medical and social history . Use of alcohol, tobacco or illicit drugs  . Current medications and supplements . Functional ability and status . Nutritional status . Physical activity . Advanced directives . List of other physicians . Hospitalizations, surgeries, and ER visits in previous 12 months . Vitals . Screenings to include cognitive, depression, and falls . Referrals and appointments  In addition, I have reviewed and discussed with patient certain preventive protocols, quality metrics, and best practice recommendations. A written personalized care plan for preventive services as well as general preventive health recommendations were provided to patient.     Nolberto Hanlon, RN  11/18/2018

## 2018-11-18 NOTE — Progress Notes (Signed)
Hello Saud,  Your lab result is normal.Some minor variations that are not significant are commonly marked abnormal, but do not represent any medical problem for you.  Best regards, Mechele Claude, M.D.

## 2018-12-15 ENCOUNTER — Other Ambulatory Visit: Payer: PPO

## 2018-12-15 ENCOUNTER — Telehealth: Payer: Self-pay | Admitting: "Endocrinology

## 2018-12-15 DIAGNOSIS — E039 Hypothyroidism, unspecified: Secondary | ICD-10-CM

## 2018-12-15 DIAGNOSIS — Z794 Long term (current) use of insulin: Secondary | ICD-10-CM

## 2018-12-15 DIAGNOSIS — E782 Mixed hyperlipidemia: Secondary | ICD-10-CM

## 2018-12-15 DIAGNOSIS — I10 Essential (primary) hypertension: Secondary | ICD-10-CM | POA: Diagnosis not present

## 2018-12-15 DIAGNOSIS — IMO0002 Reserved for concepts with insufficient information to code with codable children: Secondary | ICD-10-CM

## 2018-12-15 DIAGNOSIS — T383X5A Adverse effect of insulin and oral hypoglycemic [antidiabetic] drugs, initial encounter: Secondary | ICD-10-CM

## 2018-12-15 DIAGNOSIS — E160 Drug-induced hypoglycemia without coma: Secondary | ICD-10-CM

## 2018-12-15 DIAGNOSIS — E1159 Type 2 diabetes mellitus with other circulatory complications: Secondary | ICD-10-CM

## 2018-12-15 DIAGNOSIS — E1165 Type 2 diabetes mellitus with hyperglycemia: Secondary | ICD-10-CM

## 2018-12-15 NOTE — Telephone Encounter (Signed)
Please change labs from [facility] to [facility]

## 2018-12-15 NOTE — Telephone Encounter (Signed)
Labs reordered for [facility] 

## 2018-12-16 LAB — LIPID PANEL
Chol/HDL Ratio: 3.5 ratio (ref 0.0–5.0)
Cholesterol, Total: 133 mg/dL (ref 100–199)
HDL: 38 mg/dL — ABNORMAL LOW (ref >39)
LDL Calculated: 65 mg/dL (ref 0–99)
Triglycerides: 150 mg/dL — ABNORMAL HIGH (ref 0–149)
VLDL Cholesterol Cal: 30 mg/dL (ref 5–40)

## 2018-12-16 LAB — HEMOGLOBIN A1C
Est. average glucose Bld gHb Est-mCnc: 171 mg/dL
Hgb A1c MFr Bld: 7.6 % — ABNORMAL HIGH (ref 4.8–5.6)

## 2018-12-16 LAB — MICROALBUMIN / CREATININE URINE RATIO
Creatinine, Urine: 129.8 mg/dL
Microalb/Creat Ratio: 3 mg/g creat (ref 0–29)
Microalbumin, Urine: 4.1 ug/mL

## 2018-12-16 LAB — CMP14+EGFR
ALBUMIN: 4.5 g/dL (ref 3.8–4.8)
ALT: 16 IU/L (ref 0–44)
AST: 17 IU/L (ref 0–40)
Albumin/Globulin Ratio: 1.8 (ref 1.2–2.2)
Alkaline Phosphatase: 81 IU/L (ref 39–117)
BUN/Creatinine Ratio: 18 (ref 10–24)
BUN: 20 mg/dL (ref 8–27)
Bilirubin Total: 0.7 mg/dL (ref 0.0–1.2)
CO2: 24 mmol/L (ref 20–29)
Calcium: 9.2 mg/dL (ref 8.6–10.2)
Chloride: 103 mmol/L (ref 96–106)
Creatinine, Ser: 1.1 mg/dL (ref 0.76–1.27)
GFR calc Af Amer: 82 mL/min/{1.73_m2} (ref >59)
GFR calc non Af Amer: 71 mL/min/{1.73_m2} (ref >59)
Globulin, Total: 2.5 g/dL (ref 1.5–4.5)
Glucose: 81 mg/dL (ref 65–99)
Potassium: 4.4 mmol/L (ref 3.5–5.2)
Sodium: 139 mmol/L (ref 134–144)
Total Protein: 7 g/dL (ref 6.0–8.5)

## 2018-12-16 LAB — VITAMIN D PNL(25-HYDRXY+1,25-DIHY)-BLD
VIT D 25 HYDROXY: 42.9 ng/mL (ref 30.0–100.0)
Vit D, 1,25-Dihydroxy: 48 pg/mL (ref 19.9–79.3)

## 2018-12-16 LAB — T4, FREE: FREE T4: 1.62 ng/dL (ref 0.82–1.77)

## 2018-12-16 LAB — TSH: TSH: 1.64 u[IU]/mL (ref 0.450–4.500)

## 2018-12-18 ENCOUNTER — Encounter: Payer: Self-pay | Admitting: Family Medicine

## 2018-12-18 ENCOUNTER — Encounter: Payer: Self-pay | Admitting: "Endocrinology

## 2018-12-18 ENCOUNTER — Ambulatory Visit (INDEPENDENT_AMBULATORY_CARE_PROVIDER_SITE_OTHER): Payer: PPO | Admitting: "Endocrinology

## 2018-12-18 VITALS — BP 127/75 | HR 60 | Ht 66.0 in | Wt 188.4 lb

## 2018-12-18 DIAGNOSIS — E1159 Type 2 diabetes mellitus with other circulatory complications: Secondary | ICD-10-CM

## 2018-12-18 DIAGNOSIS — I10 Essential (primary) hypertension: Secondary | ICD-10-CM

## 2018-12-18 DIAGNOSIS — E782 Mixed hyperlipidemia: Secondary | ICD-10-CM

## 2018-12-18 DIAGNOSIS — E039 Hypothyroidism, unspecified: Secondary | ICD-10-CM | POA: Diagnosis not present

## 2018-12-18 MED ORDER — INSULIN NPH (HUMAN) (ISOPHANE) 100 UNIT/ML ~~LOC~~ SUSP: SUBCUTANEOUS | 2 refills | Status: DC

## 2018-12-18 NOTE — Progress Notes (Signed)
Ethan Peters, CMA  

## 2018-12-18 NOTE — Patient Instructions (Signed)

## 2018-12-18 NOTE — Progress Notes (Signed)
Endocrinology Consult Note       12/18/2018, 5:31 PM   Subjective:    Patient ID: Ethan Peters, male    DOB: Jul 10, 1956.  Ethan Peters is being seen in consultation for management of currently uncontrolled symptomatic diabetes requested by  Claretta Fraise, MD.   Past Medical History:  Diagnosis Date  . CAD (coronary artery disease) 01/09/13   Inferior STEMI s/p DES-RCA  . Diabetes mellitus    Uncontrolled, Hgb A1C 14.8% on 03/14, started on insulin  . Gout   . Hypertension   . Ischemic cardiomyopathy    EF 44-92%, grade 1 diastolic dysfunction, mildly dilated RV, RA at the upper limits of normal, mild TR, PA systolic pressure 32 mm mercury and hypokinesis to akinesis of the basal mid inferior myocardium  . Myocardial infarction ([place]) 01/09/2013  . Shortness of breath    Past Surgical History:  Procedure Laterality Date  . CARDIAC CATHETERIZATION  01/22/2013   Diffuse borderline residual CAD consistent with uncontrolled diabetes, medical management recommended  . CORONARY ANGIOPLASTY WITH STENT PLACEMENT  01/09/2013   30% pLAD, 30% mLAD, 70% pLCx, RCA occlusion at crux with R->L distal collaterals s/p DES; LVEF 50%, moderate-severe HK of inferior basal wall  . LEFT HEART CATHETERIZATION WITH CORONARY ANGIOGRAM N/A 01/09/2013   Procedure: LEFT HEART CATHETERIZATION WITH CORONARY ANGIOGRAM;  Surgeon: Peter M Martinique, MD;  Location: [facility] CATH LAB;  Service: Cardiovascular;  Laterality: N/A;  . LEFT HEART CATHETERIZATION WITH CORONARY ANGIOGRAM N/A 01/22/2013   Procedure: LEFT HEART CATHETERIZATION WITH CORONARY ANGIOGRAM;  Surgeon: Sherren Mocha, MD;  Location: [facility] CATH LAB;  Service: Cardiovascular;  Laterality: N/A;   Social History   Socioeconomic History  . Marital status: Married    Spouse name: Not on file  . Number of children: 24  . Years of education: Not on file  . Highest education level:  Associate degree: occupational, Hotel manager, or vocational program  Occupational History  . Occupation: disabled    Comment: Furniture conservator/restorer   Social Needs  . Financial resource strain: Not very hard  . Food insecurity:    Worry: Never true    Inability: Never true  . Transportation needs:    Medical: No    Non-medical: No  Tobacco Use  . Smoking status: Never Smoker  . Smokeless tobacco: Never Used  Substance and Sexual Activity  . Alcohol use: Yes    Comment: 4 times per year  . Drug use: No  . Sexual activity: Not on file  Lifestyle  . Physical activity:    Days per week: 5 days    Minutes per session: 60 min  . Stress: Only a little  Relationships  . Social connections:    Talks on phone: Never    Gets together: More than three times a week    Attends religious service: Never    Active member of club or organization: No    Attends meetings of clubs or organizations: Never    Relationship status: Married  Other Topics Concern  . Not on file  Social History Narrative   Spanish-speaking. Limited English.    Outpatient Encounter  Medications as of 12/18/2018  Medication Sig  . allopurinol (ZYLOPRIM) 300 MG tablet TAKE ONE TABLET BY MOUTH ONCE DAILY AS NEEDED FOR GOUT PAIN  . aspirin EC 81 MG EC tablet Take 1 tablet (81 mg total) by mouth daily.  Marland Kitchen atorvastatin (LIPITOR) 80 MG tablet TAKE 1 TABLET BY MOUTH ONCE DAILY AT 6 PM FOR CHOLESTEROL  . B Complex-C (B-COMPLEX WITH VITAMIN C) tablet Take 1 tablet by mouth as needed (for vitamin deficiency).  . Blood Glucose Monitoring Suppl (BLOOD GLUCOSE MONITOR SYSTEM) w/Device KIT 1 Device by Does not apply route 2 (two) times daily.  . candesartan (ATACAND) 16 MG tablet Take 1 tablet (16 mg total) by mouth daily.  Marland Kitchen donepezil (ARICEPT) 10 MG tablet Take 1 tablet (10 mg total) by mouth at bedtime.  . ergocalciferol (VITAMIN D2) 50000 units capsule Take 1 capsule (50,000 Units total) by mouth once a week.  . gabapentin (NEURONTIN) 300 MG  capsule Take 1 capsule (300 mg total) by mouth 2 (two) times daily.  Marland Kitchen glucose blood (ONETOUCH VERIO) test strip USE TO CHECK GLUCOSE 4 TIMES DAILY  . insulin NPH Human (NOVOLIN N) 100 UNIT/ML injection Take 30 units with breakfast and 20 units with supper when blood glucose readings are above 90 mg/dL.  Marland Kitchen levothyroxine (SYNTHROID, LEVOTHROID) 50 MCG tablet Take 1 tablet (50 mcg total) by mouth daily.  . metFORMIN (GLUCOPHAGE) 1000 MG tablet Take 1 tablet (1,000 mg total) by mouth 2 (two) times daily with a meal.  . nitroGLYCERIN (NITROSTAT) 0.4 MG SL tablet Place 1 tablet (0.4 mg total) under the tongue every 5 (five) minutes x 3 doses as needed for chest pain.  . traZODone (DESYREL) 150 MG tablet Take 1/3 tablet to 1 tablet QHS for sleep.  . [DISCONTINUED] insulin NPH Human (NOVOLIN N) 100 UNIT/ML injection Inject 0.3 mLs (30 Units total) into the skin 2 (two) times daily before a meal. Take 35 units before breakfast and supper.   No facility-administered encounter medications on file as of 12/18/2018.     ALLERGIES: Allergies  Allergen Reactions  . Levemir [Insulin Detemir] Swelling  . Lisinopril Cough  . Penicillins Rash    Lip swelling    VACCINATION STATUS: Immunization History  Administered Date(s) Administered  . Influenza,inj,Quad PF,6+ Mos 08/22/2015, 07/27/2016, 07/16/2017, 08/13/2018  . Pneumococcal Conjugate-13 11/27/2016  . Pneumococcal Polysaccharide-23 09/25/2007, 08/13/2018  . Tdap 03/31/2012    Diabetes  He presents for his follow-up diabetic visit. He has type 2 diabetes mellitus. Onset time: He was diagnosed at approximate age of 33 years. His disease course has been improving. Hypoglycemia symptoms include headaches and sweats. Pertinent negatives for hypoglycemia include no confusion, pallor or seizures. Pertinent negatives for diabetes include no chest pain, no fatigue, no polydipsia, no polyphagia, no polyuria and no weakness. There are no hypoglycemic  complications. Symptoms are improving. Diabetic complications include heart disease and retinopathy. (Patient is status post stent placement in unidentified coronaries in 2014.) Risk factors for coronary artery disease include diabetes mellitus, dyslipidemia, family history, hypertension, male sex and sedentary lifestyle. Current diabetic treatment includes insulin injections and oral agent (monotherapy). His weight is decreasing steadily. He is following a generally unhealthy diet. When asked about meal planning, he reported none. He has not had a previous visit with a dietitian. He participates in exercise intermittently. His breakfast blood glucose range is generally 140-180 mg/dl. His lunch blood glucose range is generally 140-180 mg/dl. His dinner blood glucose range is generally 140-180 mg/dl. His bedtime  blood glucose range is generally 130-140 mg/dl. His overall blood glucose range is 140-180 mg/dl. An ACE inhibitor/angiotensin II receptor blocker is not being taken. He does not see a podiatrist.Eye exam is current.  Hyperlipidemia  This is a chronic problem. The current episode started more than 1 year ago. The problem is uncontrolled. Exacerbating diseases include diabetes and hypothyroidism. Pertinent negatives include no chest pain, myalgias or shortness of breath. Current antihyperlipidemic treatment includes statins. Risk factors for coronary artery disease include diabetes mellitus, dyslipidemia, family history, male sex, hypertension and a sedentary lifestyle.  Hypertension  This is a chronic problem. The current episode started more than 1 year ago. The problem is controlled. Associated symptoms include headaches and sweats. Pertinent negatives include no chest pain, neck pain, palpitations or shortness of breath. Risk factors for coronary artery disease include dyslipidemia, diabetes mellitus, family history, male gender and sedentary lifestyle. Past treatments include nothing. Hypertensive  end-organ damage includes retinopathy.     Review of Systems  Constitutional: Negative for chills, fatigue, fever and unexpected weight change.  HENT: Negative for dental problem, mouth sores and trouble swallowing.   Eyes: Negative for visual disturbance.  Respiratory: Negative for cough, choking, chest tightness, shortness of breath and wheezing.   Cardiovascular: Negative for chest pain, palpitations and leg swelling.  Gastrointestinal: Negative for abdominal distention, abdominal pain, constipation, diarrhea, nausea and vomiting.  Endocrine: Negative for polydipsia, polyphagia and polyuria.  Genitourinary: Negative for dysuria, flank pain, hematuria and urgency.  Musculoskeletal: Negative for back pain, gait problem, myalgias and neck pain.  Skin: Negative for pallor, rash and wound.  Neurological: Positive for headaches. Negative for seizures, syncope, weakness and numbness.  Psychiatric/Behavioral: Negative for confusion and dysphoric mood.    Objective:    BP 127/75   Pulse 60   Ht '5\' 6"'  (1.676 m)   Wt 188 lb 6.4 oz (85.5 kg)   BMI 30.41 kg/m   Wt Readings from Last 3 Encounters:  12/18/18 188 lb 6.4 oz (85.5 kg)  11/18/18 194 lb (88 kg)  11/17/18 193 lb (87.5 kg)     Physical Exam Constitutional:      General: He is not in acute distress.    Appearance: He is well-developed.  HENT:     Head: Normocephalic and atraumatic.  Neck:     Musculoskeletal: Normal range of motion and neck supple.     Thyroid: No thyromegaly.     Trachea: No tracheal deviation.  Cardiovascular:     Pulses:          Dorsalis pedis pulses are 1+ on the right side and 1+ on the left side.       Posterior tibial pulses are 1+ on the right side and 1+ on the left side.     Heart sounds: S1 normal and S2 normal. No murmur. No gallop.   Pulmonary:     Effort: Pulmonary effort is normal. No respiratory distress.     Breath sounds: No wheezing.  Abdominal:     General: There is no distension.      Tenderness: There is no abdominal tenderness. There is no guarding.  Musculoskeletal:     Right shoulder: He exhibits no swelling and no deformity.  Skin:    General: Skin is warm and dry.     Findings: No rash.     Nails: There is no clubbing.   Neurological:     Mental Status: He is alert and oriented to person, place, and time.  Cranial Nerves: No cranial nerve deficit.     Sensory: No sensory deficit.     Gait: Gait normal.     Deep Tendon Reflexes: Reflexes are normal and symmetric.  Psychiatric:        Speech: Speech normal.        Behavior: Behavior normal. Behavior is cooperative.        Thought Content: Thought content normal.        Judgment: Judgment normal.       CMP ( most recent) CMP     Component Value Date/Time   NA 139 12/15/2018 0000   K 4.4 12/15/2018 0000   CL 103 12/15/2018 0000   CO2 24 12/15/2018 0000   GLUCOSE 81 12/15/2018 0000   GLUCOSE 154 (H) 06/20/2016 0923   BUN 20 12/15/2018 0000   CREATININE 1.10 12/15/2018 0000   CREATININE 1.19 06/20/2016 0923   CALCIUM 9.2 12/15/2018 0000   PROT 7.0 12/15/2018 0000   ALBUMIN 4.5 12/15/2018 0000   AST 17 12/15/2018 0000   ALT 16 12/15/2018 0000   ALKPHOS 81 12/15/2018 0000   BILITOT 0.7 12/15/2018 0000   GFRNONAA 71 12/15/2018 0000   GFRAA 82 12/15/2018 0000     Diabetic Labs (most recent): Lab Results  Component Value Date   HGBA1C 7.6 (H) 12/15/2018   HGBA1C 8.0 (H) 08/13/2018   HGBA1C 8.2 (H) 05/09/2018     Lipid Panel ( most recent) Lipid Panel     Component Value Date/Time   CHOL 133 12/15/2018 0000   TRIG 150 (H) 12/15/2018 0000   TRIG 77 06/01/2013 0841   HDL 38 (L) 12/15/2018 0000   HDL 44 06/01/2013 0841   CHOLHDL 3.5 12/15/2018 0000   CHOLHDL 2.9 06/20/2016 0923   VLDL 20 06/20/2016 0923   LDLCALC 65 12/15/2018 0000   LDLCALC 45 06/01/2013 0841      Lab Results  Component Value Date   TSH 1.640 12/15/2018   TSH 2.190 11/17/2018   TSH 1.510 05/09/2018    TSH 1.130 07/16/2017   TSH 1.41 06/20/2016   TSH 5.120 (H) 11/02/2015   TSH 2.880 09/22/2014   TSH 0.928 01/09/2013   FREET4 1.62 12/15/2018   FREET4 1.58 11/17/2018   FREET4 1.40 05/09/2018   FREET4 1.43 07/16/2017   FREET4 1.3 06/20/2016      Assessment & Plan:   1. DM type 2 causing vascular disease ([place])  - Ethan Peters has currently uncontrolled symptomatic type 2 DM since 63 years of age.  He returns with significantly improved glycemic profile, his recent A1c 7.6% improving from 8%.  He lost 6 pounds since last visit.  -his diabetes is complicated by coronary artery disease which required stent placement, retinopathy and he remains at a high risk for more acute and chronic complications which include CAD, CVA, CKD, retinopathy, and neuropathy. These are all discussed in detail with him.  - I have counseled him on diet management and weight loss, by adopting a carbohydrate restricted/protein rich diet.  - Patient admits there is a room for improvement in his diet and drink choices. -  Suggestion is made for him to avoid simple carbohydrates  from his diet including Cakes, Sweet Desserts / Pastries, Ice Cream, Soda (diet and regular), Sweet Tea, Candies, Chips, Cookies, Store Bought Juices, Alcohol in Excess of  1-2 drinks a day, Artificial Sweeteners, and "Sugar-free" Products. This will help patient to have stable blood glucose profile and potentially avoid unintended weight gain.  -  I encouraged him to switch to  unprocessed or minimally processed complex starch and increased protein intake (animal or plant source), fruits, and vegetables.  - he is advised to stick to a routine mealtimes to eat 3 meals  a day and avoid unnecessary snacks ( to snack only to correct hypoglycemia).   - he is  scheduled with Jearld Fenton, RDN, CDE for individualized diabetes education.  - I have approached him with the following individualized plan to manage diabetes and patient agrees:    -He wishes to stay on Novolin in for cost reasons.   -Due to tight blood glucose readings in the morning, he is advised to lower his NPH to 20 with supper, continue at 30 units with breakfast associated with   strict monitoring of glucose 4 times a day-before meals and at bedtime. - he is warned not to take insulin without proper monitoring per orders. - he is encouraged to call clinic for blood glucose levels less than 70 or above 300 mg /dl. - he is advised to continue metformin  1000 mg p.o. twice daily, therapeutically suitable for patient .  - he will be considered for incretin therapy as appropriate next visit.  - Patient specific target  A1c;  LDL, HDL, Triglycerides, and  Waist Circumference were discussed in detail.  2) Blood Pressure /Hypertension:  his blood pressure is controlled to target.  He is advised to continue his  current medications including candesartan 16 mg p.o. daily with breakfast .  3) Lipids/Hyperlipidemia:   Review of his recent lipid panel showed  controlled  LDL at 60.  he  is advised to continue atorvastatin 80 mg p.o. daily at bedtime .  Side effects and precautions discussed with him.  4)  Weight/Diet:  Body mass index is 30.41 kg/m.  - clearly complicating his diabetes care.  I discussed with him the fact that loss of 5 - 10% of his  current body weight will have the most impact on his diabetes management.  CDE Consult will be initiated . Exercise, and detailed carbohydrates information provided  -  detailed on discharge instructions.  5) Hypothyroidism -His previsit thyroid function tests are consistent with appropriate replacement.  He is advised to continue levothyroxine 50 mcg p.o. daily before breakfast.   - We discussed about the correct intake of his thyroid hormone, on empty stomach at fasting, with water, separated by at least 30 minutes from breakfast and other medications,  and separated by more than 4 hours from calcium, iron, multivitamins, acid  reflux medications (PPIs). -Patient is made aware of the fact that thyroid hormone replacement is needed for life, dose to be adjusted by periodic monitoring of thyroid function tests.  6) Chronic Care/Health Maintenance:  -he  is on ACEI/ARB and Statin medications and  is encouraged to initiate and continue to follow up with Ophthalmology, Dentist,  Podiatrist at least yearly or according to recommendations, and advised to  stay away from smoking. I have recommended yearly flu vaccine and pneumonia vaccine at least every 5 years; moderate intensity exercise for up to 150 minutes weekly; and  sleep for at least 7 hours a day.  - he is  advised to maintain close follow up with Claretta Fraise, MD for primary care needs, as well as his other providers for optimal and coordinated care. - Time spent with the patient: 25 min, of which >50% was spent in reviewing his blood glucose logs , discussing his hypoglycemia and hyperglycemia episodes, reviewing his  current and  previous labs / studies and medications  doses and developing a plan to avoid hypoglycemia and hyperglycemia. Please refer to Patient Instructions for Blood Glucose Monitoring and Insulin/Medications Dosing Guide"  in media tab for additional information. Ethan Peters participated in the discussions, expressed understanding, and voiced agreement with the above plans.  All questions were answered to his satisfaction. he is encouraged to contact clinic should he have any questions or concerns prior to his return visit.  Follow up plan: - Return in about 4 months (around 04/19/2019) for Follow up with Pre-visit Labs, Meter, and Logs.  Glade Lloyd, MD [facility] [address] [place], [place] [address] Phone: [phone]  Fax: [phone]    12/18/2018, 5:31 PM  This note was partially dictated with voice recognition software. Similar sounding words can be transcribed inadequately or  may not  be corrected upon review.

## 2019-01-01 ENCOUNTER — Telehealth: Payer: Self-pay

## 2019-01-05 ENCOUNTER — Ambulatory Visit: Payer: Self-pay | Admitting: Cardiology

## 2019-01-05 ENCOUNTER — Other Ambulatory Visit: Payer: Self-pay

## 2019-01-05 NOTE — Telephone Encounter (Signed)
PT RESCHEDULED [place] APPT TO 03-30-2019

## 2019-01-05 NOTE — Telephone Encounter (Signed)
   Primary Cardiologist:  Peter [place], MD   Patient contacted.  History reviewed.  No symptoms to suggest any unstable cardiac conditions.  Based on discussion, with current pandemic situation, we will be postponing this 01/05/19 appointment for [facility].  If symptoms change, he has been instructed to contact our office.   Appointment rescheduled to 03/30/19 at 8:20 am.  Neoma Laming, LPN  7/48/2707 8:67 PM         .

## 2019-01-06 ENCOUNTER — Telehealth: Payer: Self-pay | Admitting: Family Medicine

## 2019-01-06 NOTE — Telephone Encounter (Signed)
Called pt - DR Fransico Him changed his insulin on 12/18/18 in Epic and pt was unaware of the change.  He went to pick it up today and noticed the change. He is aware to Call Dr Fransico Him and discuss this.

## 2019-01-07 ENCOUNTER — Other Ambulatory Visit: Payer: Self-pay

## 2019-01-07 ENCOUNTER — Telehealth: Payer: Self-pay | Admitting: "Endocrinology

## 2019-01-07 MED ORDER — INSULIN NPH (HUMAN) (ISOPHANE) 100 UNIT/ML ~~LOC~~ SUSP: SUBCUTANEOUS | 2 refills | Status: DC

## 2019-01-07 NOTE — Telephone Encounter (Signed)
He can increase his supper time  Novolin to 44 units , keep it at 40 at breakfast. I believe he can resolve the charge with the pharmacy directly, we can not help with that.

## 2019-01-07 NOTE — Telephone Encounter (Signed)
Called pt. No answer. New Rx sent to [facility] with new dosage per Dr Fransico Him.

## 2019-01-07 NOTE — Telephone Encounter (Signed)
Pt called extremely upset stating that we needed to call in Novolin 40 units bid. I tried to explain to Mr Villapudua that I needed his BG readings to give to Dr Fransico Him to review. That we could not just increase his dosage upon request. He did give me his readings but continued to yell that this was a "simple request and we needed to do this now". He also states that the pharmacy charged him for Novolog and gave him Novolin. Pt and his wife continued to yell in the phone and I explained that to continue the conversation that he needed to quit yelling. Pt hung up.    Date Before breakfast Before lunch Before supper Bedtime  3/23 207   183  3/24 174   157  3/25 174             Pt taking: Novolin N 30units qam & 20 units qhs  I did ask pt if he was testing BG 4 x daily before each meal and at bedtime as requested in office note. Pt states he was not supposed to test 4 x daily.

## 2019-01-08 NOTE — Telephone Encounter (Signed)
Called pt a second time. No answer. Awaiting return call.

## 2019-03-23 ENCOUNTER — Telehealth: Payer: Self-pay | Admitting: Cardiology

## 2019-03-24 NOTE — Telephone Encounter (Signed)
smartphone/ consent/ my chart/ pre reg completed °

## 2019-03-26 NOTE — Progress Notes (Signed)
Virtual Visit via Telephone Note   This visit type was conducted due to national recommendations for restrictions regarding the COVID-19 Pandemic (e.g. social distancing) in an effort to limit this patient's exposure and mitigate transmission in our community.  Due to his co-morbid illnesses, this patient is at least at moderate risk for complications without adequate follow up.  This format is felt to be most appropriate for this patient at this time.  The patient did not have access to video technology/had technical difficulties with video requiring transitioning to audio format only (telephone).  All issues noted in this document were discussed and addressed.  No physical exam could be performed with this format.  Please refer to the patient's chart for his  consent to telehealth for [facility].   Date:  03/30/2019   ID:  Ethan Peters, DOB May 19, 1956, MRN [id number]  Patient Location: Home Provider Location: Home  PCP:  Claretta Fraise, MD  Cardiologist:  April Colter Martinique, MD Electrophysiologist:  None   Evaluation Performed:  Follow-Up Visit  Chief Complaint:  Follow up CAD  History of Present Illness:    Ethan Peters is a 64 y.o. male with history of CAD. He is status post inferior STEMI on 01/09/2013. He had stenting of the RCA at the crux. He had persistent chest pain and dyspnea even after his infarct with atypical symptoms. He subsequently underwent repeat cardiac catheterization on April 10,2014 which showed excellent patency of the stent in the RCA. He does have a long 70% stenosis in the left circumflex,  treated medically. He had a Myovew study in September 2017 which showed an inferior scar without ischemia. EF was 48%.  He has a history of poorly controlled diabetes mellitus with retinopathy and neuropathy.   On follow up today he is doing well. Denies any chest pain or SOB. Is walking regularly for 30-60 minutes. No edema or palpitations. Notes BP running higher.  Sugars are typically 120-150. Does not have scales to weigh at home. No problems with medication.  The patient does not have symptoms concerning for COVID-19 infection (fever, chills, cough, or new shortness of breath).    Past Medical History:  Diagnosis Date  . CAD (coronary artery disease) 01/09/13   Inferior STEMI s/p DES-RCA  . Diabetes mellitus    Uncontrolled, Hgb A1C 14.8% on 03/14, started on insulin  . Gout   . Hypertension   . Ischemic cardiomyopathy    EF 76-81%, grade 1 diastolic dysfunction, mildly dilated RV, RA at the upper limits of normal, mild TR, PA systolic pressure 32 mm mercury and hypokinesis to akinesis of the basal mid inferior myocardium  . Myocardial infarction ([place]) 01/09/2013  . Shortness of breath    Past Surgical History:  Procedure Laterality Date  . CARDIAC CATHETERIZATION  01/22/2013   Diffuse borderline residual CAD consistent with uncontrolled diabetes, medical management recommended  . CORONARY ANGIOPLASTY WITH STENT PLACEMENT  01/09/2013   30% pLAD, 30% mLAD, 70% pLCx, RCA occlusion at crux with R->L distal collaterals s/p DES; LVEF 50%, moderate-severe HK of inferior basal wall  . LEFT HEART CATHETERIZATION WITH CORONARY ANGIOGRAM N/A 01/09/2013   Procedure: LEFT HEART CATHETERIZATION WITH CORONARY ANGIOGRAM;  Surgeon: Geno Sydnor M Martinique, MD;  Location: [facility] CATH LAB;  Service: Cardiovascular;  Laterality: N/A;  . LEFT HEART CATHETERIZATION WITH CORONARY ANGIOGRAM N/A 01/22/2013   Procedure: LEFT HEART CATHETERIZATION WITH CORONARY ANGIOGRAM;  Surgeon: Sherren Mocha, MD;  Location[facility] C[facility] LAB;  Service: Cardiovascular;  Laterality: N/A;  Current Meds  Medication Sig  . allopurinol (ZYLOPRIM) 300 MG tablet TAKE ONE TABLET BY MOUTH ONCE DAILY AS NEEDED FOR GOUT PAIN  . aspirin EC 81 MG EC tablet Take 1 tablet (81 mg total) by mouth daily.  Marland Kitchen atorvastatin (LIPITOR) 80 MG tablet TAKE 1 TABLET BY MOUTH ONCE DAILY AT 6 PM FOR CHOLESTEROL  . B Complex-C  (B-COMPLEX WITH VITAMIN C) tablet Take 1 tablet by mouth as needed (for vitamin deficiency).  . Blood Glucose Monitoring Suppl (BLOOD GLUCOSE MONITOR SYSTEM) w/Device KIT 1 Device by Does not apply route 2 (two) times daily.  Marland Kitchen donepezil (ARICEPT) 10 MG tablet Take 1 tablet (10 mg total) by mouth at bedtime.  . ergocalciferol (VITAMIN D2) 50000 units capsule Take 1 capsule (50,000 Units total) by mouth once a week.  . gabapentin (NEURONTIN) 300 MG capsule Take 1 capsule (300 mg total) by mouth 2 (two) times daily.  Marland Kitchen glucose blood (ONETOUCH VERIO) test strip USE TO CHECK GLUCOSE 4 TIMES DAILY  . insulin NPH Human (NOVOLIN N) 100 UNIT/ML injection Take 40 units with breakfast and 44 units with supper when blood glucose readings are above 90 mg/dL.  Marland Kitchen levothyroxine (SYNTHROID, LEVOTHROID) 50 MCG tablet Take 1 tablet (50 mcg total) by mouth daily.  . metFORMIN (GLUCOPHAGE) 1000 MG tablet Take 1 tablet (1,000 mg total) by mouth 2 (two) times daily with a meal.  . nitroGLYCERIN (NITROSTAT) 0.4 MG SL tablet Place 1 tablet (0.4 mg total) under the tongue every 5 (five) minutes x 3 doses as needed for chest pain.  . traZODone (DESYREL) 150 MG tablet Take 1/3 tablet to 1 tablet QHS for sleep.  . [DISCONTINUED] candesartan (ATACAND) 16 MG tablet Take 1 tablet (16 mg total) by mouth daily.     Allergies:   Levemir [insulin detemir], Lisinopril, and Penicillins   Social History   Tobacco Use  . Smoking status: Never Smoker  . Smokeless tobacco: Never Used  Substance Use Topics  . Alcohol use: Yes    Comment: 4 times per year  . Drug use: No     Family Hx: The patient's family history includes Heart disease in his brother, brother, father, and mother.  ROS:   Please see the history of present illness.    All other systems reviewed and are negative.   Prior CV studies:   The following studies were reviewed today:  none  Labs/Other Tests and Data Reviewed:    EKG:  No ECG reviewed.   Recent Labs: 11/17/2018: Hemoglobin 12.9; Platelets 192 12/15/2018: ALT 16; BUN 20; Creatinine, Ser 1.10; Potassium 4.4; Sodium 139; TSH 1.640 A1c 7.6%   Recent Lipid Panel Lab Results  Component Value Date/Time   CHOL 133 12/15/2018 12:00 AM   TRIG 150 (H) 12/15/2018 12:00 AM   TRIG 77 06/01/2013 08:41 AM   HDL 38 (L) 12/15/2018 12:00 AM   HDL 44 06/01/2013 08:41 AM   CHOLHDL 3.5 12/15/2018 12:00 AM   CHOLHDL 2.9 06/20/2016 09:23 AM   LDLCALC 65 12/15/2018 12:00 AM   LDLCALC 45 06/01/2013 08:41 AM    Wt Readings from Last 3 Encounters:  12/18/18 188 lb 6.4 oz (85.5 kg)  11/18/18 194 lb (88 kg)  11/17/18 193 lb (87.5 kg)     Objective:    Vital Signs:  BP (!) 149/82   Pulse 71   Ht '5\' 6"'  (1.676 m)   BMI 30.41 kg/m    VITAL SIGNS:  reviewed  ASSESSMENT & PLAN:  1. Coronary disease status post inferior STEMI 2014 treated with DES to the RCA. Repeat cardiac catheterization in 2014 demonstrated continued patency. Moderate diffuse disease in the left circumflex. No ischemia by [facility] September 2017. He is asymptomatic.  We will continue aspirin and statin.   2. Diabetes mellitus: On metformin, glipizide, and insulin . Followed by primary care. Last A1c 7.6%.   3. Dyslipidemia. On statin therapy. Excellent control.  4. Hypertension, BP is elevated. Will increase Candesartan to 32 mg daily.   5. Diabetic retinopathy and neuropathy.   COVID-19 Education: The signs and symptoms of COVID-19 were discussed with the patient and how to seek care for testing (follow up with PCP or arrange E-visit).  The importance of social distancing was discussed today.  Time:   Today, I have spent 15 minutes with the patient with telehealth technology discussing the above problems.     Medication Adjustments/Labs and Tests Ordered: Current medicines are reviewed at length with the patient today.  Concerns regarding medicines are outlined above.   Tests Ordered: No orders of the  defined types were placed in this encounter.   Medication Changes: Meds ordered this encounter  Medications  . candesartan (ATACAND) 32 MG tablet    Sig: Take 1 tablet (32 mg total) by mouth daily.    Dispense:  90 tablet    Refill:  3    Disposition:  Follow up in 6 month(s)  Signed, Dilan Fullenwider Martinique, MD  03/30/2019 8:47 AM    [facility]

## 2019-03-30 ENCOUNTER — Encounter: Payer: Self-pay | Admitting: Cardiology

## 2019-03-30 ENCOUNTER — Other Ambulatory Visit: Payer: Self-pay

## 2019-03-30 ENCOUNTER — Telehealth (INDEPENDENT_AMBULATORY_CARE_PROVIDER_SITE_OTHER): Payer: PPO | Admitting: Cardiology

## 2019-03-30 VITALS — BP 149/82 | HR 71 | Ht 66.0 in

## 2019-03-30 DIAGNOSIS — I10 Essential (primary) hypertension: Secondary | ICD-10-CM

## 2019-03-30 DIAGNOSIS — E782 Mixed hyperlipidemia: Secondary | ICD-10-CM

## 2019-03-30 DIAGNOSIS — I2510 Atherosclerotic heart disease of native coronary artery without angina pectoris: Secondary | ICD-10-CM

## 2019-03-30 DIAGNOSIS — E0840 Diabetes mellitus due to underlying condition with diabetic neuropathy, unspecified: Secondary | ICD-10-CM

## 2019-03-30 MED ORDER — CANDESARTAN CILEXETIL 32 MG PO TABS: 32.0000 mg | ORAL_TABLET | Freq: Every day | ORAL | 3 refills | Status: DC

## 2019-03-30 NOTE — Patient Instructions (Signed)
Medication Instructions:  Increase Candesartan to 32 mg daily  Continue all other medications  If you need a refill on your cardiac medications before your next appointment, please call your pharmacy.   Lab work: None ordered   Testing/Procedures: None ordered  Follow-Up: At [facility], you and your health needs are our priority.  As part of our continuing mission to provide you with exceptional heart care, we have created designated Provider Care Teams.  These Care Teams include your primary Cardiologist (physician) and Advanced Practice Providers (APPs -  Physician Assistants and Nurse Practitioners) who all work together to provide you with the care you need, when you need it. . Schedule follow up appointment in 6 months    Call 3 months before to schedule

## 2019-04-20 ENCOUNTER — Ambulatory Visit: Payer: PPO | Admitting: "Endocrinology

## 2019-05-19 ENCOUNTER — Ambulatory Visit: Payer: PPO | Admitting: Family Medicine

## 2019-06-09 DIAGNOSIS — H40053 Ocular hypertension, bilateral: Secondary | ICD-10-CM | POA: Diagnosis not present

## 2019-06-09 DIAGNOSIS — E119 Type 2 diabetes mellitus without complications: Secondary | ICD-10-CM | POA: Diagnosis not present

## 2019-06-09 DIAGNOSIS — Z961 Presence of intraocular lens: Secondary | ICD-10-CM | POA: Diagnosis not present

## 2019-06-09 DIAGNOSIS — H26491 Other secondary cataract, right eye: Secondary | ICD-10-CM | POA: Diagnosis not present

## 2019-06-23 DIAGNOSIS — E113512 Type 2 diabetes mellitus with proliferative diabetic retinopathy with macular edema, left eye: Secondary | ICD-10-CM | POA: Diagnosis not present

## 2019-06-23 DIAGNOSIS — H35371 Puckering of macula, right eye: Secondary | ICD-10-CM | POA: Diagnosis not present

## 2019-06-23 DIAGNOSIS — H40052 Ocular hypertension, left eye: Secondary | ICD-10-CM | POA: Diagnosis not present

## 2019-06-23 DIAGNOSIS — E113591 Type 2 diabetes mellitus with proliferative diabetic retinopathy without macular edema, right eye: Secondary | ICD-10-CM | POA: Diagnosis not present

## 2019-07-08 ENCOUNTER — Other Ambulatory Visit: Payer: Self-pay | Admitting: Family Medicine

## 2019-07-09 NOTE — Telephone Encounter (Signed)
Patient needs to return for follow-up before next refill.

## 2019-07-13 ENCOUNTER — Telehealth: Payer: Self-pay | Admitting: Family Medicine

## 2019-07-13 NOTE — Telephone Encounter (Signed)
Please let im know that since he is already 2 mos beyond his recommended return date, I can only authorize enough med to last until his appt. On 10/12. If he wants me to do that, let me know, but I can not do 90 days supply. Thanks, WS

## 2019-07-13 NOTE — Telephone Encounter (Signed)
Patient last seen Dr. Livia Snellen 11/17/18 and has a follow up 07/27/19.  Please review and advise.

## 2019-07-14 ENCOUNTER — Other Ambulatory Visit: Payer: Self-pay | Admitting: Family Medicine

## 2019-07-14 MED ORDER — CANDESARTAN CILEXETIL 32 MG PO TABS: 32.0000 mg | ORAL_TABLET | Freq: Every day | ORAL | 0 refills | Status: DC

## 2019-07-14 MED ORDER — ALLOPURINOL 300 MG PO TABS: ORAL_TABLET | ORAL | 0 refills | Status: DC

## 2019-07-14 MED ORDER — METFORMIN HCL 1000 MG PO TABS: 1000.0000 mg | ORAL_TABLET | Freq: Two times a day (BID) | ORAL | 0 refills | Status: DC

## 2019-07-14 MED ORDER — TRAZODONE HCL 150 MG PO TABS: ORAL_TABLET | ORAL | 0 refills | Status: DC

## 2019-07-14 MED ORDER — GABAPENTIN 300 MG PO CAPS: 300.0000 mg | ORAL_CAPSULE | Freq: Two times a day (BID) | ORAL | 0 refills | Status: DC

## 2019-07-14 MED ORDER — LEVOTHYROXINE SODIUM 50 MCG PO TABS: 50.0000 ug | ORAL_TABLET | Freq: Every day | ORAL | 0 refills | Status: DC

## 2019-07-14 MED ORDER — INSULIN NPH (HUMAN) (ISOPHANE) 100 UNIT/ML ~~LOC~~ SUSP: SUBCUTANEOUS | 0 refills | Status: DC

## 2019-07-14 MED ORDER — GLIPIZIDE 5 MG PO TABS: 5.0000 mg | ORAL_TABLET | Freq: Every day | ORAL | 0 refills | Status: DC

## 2019-07-14 MED ORDER — NITROGLYCERIN 0.4 MG SL SUBL: 0.4000 mg | SUBLINGUAL_TABLET | SUBLINGUAL | 0 refills | Status: DC | PRN

## 2019-07-14 MED ORDER — ERGOCALCIFEROL 1.25 MG (50000 UT) PO CAPS: 50000.0000 [IU] | ORAL_CAPSULE | ORAL | 0 refills | Status: DC

## 2019-07-14 MED ORDER — ATORVASTATIN CALCIUM 80 MG PO TABS: ORAL_TABLET | ORAL | 0 refills | Status: DC

## 2019-07-14 MED ORDER — DONEPEZIL HCL 10 MG PO TABS: 10.0000 mg | ORAL_TABLET | Freq: Every day | ORAL | 0 refills | Status: DC

## 2019-07-14 NOTE — Telephone Encounter (Signed)
I sent in the requested prescription 

## 2019-07-27 ENCOUNTER — Ambulatory Visit (INDEPENDENT_AMBULATORY_CARE_PROVIDER_SITE_OTHER): Payer: PPO | Admitting: Family Medicine

## 2019-07-27 ENCOUNTER — Encounter: Payer: Self-pay | Admitting: Family Medicine

## 2019-07-27 DIAGNOSIS — I25118 Atherosclerotic heart disease of native coronary artery with other forms of angina pectoris: Secondary | ICD-10-CM

## 2019-07-27 DIAGNOSIS — I10 Essential (primary) hypertension: Secondary | ICD-10-CM | POA: Diagnosis not present

## 2019-07-27 DIAGNOSIS — E1342 Other specified diabetes mellitus with diabetic polyneuropathy: Secondary | ICD-10-CM | POA: Diagnosis not present

## 2019-07-27 MED ORDER — NITROGLYCERIN 0.4 MG SL SUBL: 0.4000 mg | SUBLINGUAL_TABLET | SUBLINGUAL | 3 refills | Status: DC | PRN

## 2019-07-27 MED ORDER — INSULIN ASPART PROT & ASPART (70-30 MIX) 100 UNIT/ML ~~LOC~~ SUSP: SUBCUTANEOUS | 11 refills | Status: DC

## 2019-07-27 MED ORDER — INSULIN ASPART PROT & ASPART (70-30 MIX) 100 UNIT/ML ~~LOC~~ SUSP: SUBCUTANEOUS | 3 refills | Status: DC

## 2019-07-27 MED ORDER — CANDESARTAN CILEXETIL 32 MG PO TABS: 32.0000 mg | ORAL_TABLET | Freq: Every day | ORAL | 1 refills | Status: DC

## 2019-07-27 MED ORDER — GLIPIZIDE 5 MG PO TABS: 5.0000 mg | ORAL_TABLET | Freq: Every day | ORAL | 1 refills | Status: DC

## 2019-07-27 MED ORDER — TRAZODONE HCL 150 MG PO TABS: ORAL_TABLET | ORAL | 1 refills | Status: DC

## 2019-07-27 MED ORDER — ERGOCALCIFEROL 1.25 MG (50000 UT) PO CAPS: 50000.0000 [IU] | ORAL_CAPSULE | ORAL | 1 refills | Status: DC

## 2019-07-27 MED ORDER — GABAPENTIN 300 MG PO CAPS: 300.0000 mg | ORAL_CAPSULE | Freq: Two times a day (BID) | ORAL | 1 refills | Status: DC

## 2019-07-27 MED ORDER — ATORVASTATIN CALCIUM 80 MG PO TABS: ORAL_TABLET | ORAL | 1 refills | Status: DC

## 2019-07-27 MED ORDER — ALLOPURINOL 300 MG PO TABS: ORAL_TABLET | ORAL | 1 refills | Status: DC

## 2019-07-27 MED ORDER — METFORMIN HCL 1000 MG PO TABS: 1000.0000 mg | ORAL_TABLET | Freq: Two times a day (BID) | ORAL | 1 refills | Status: DC

## 2019-07-27 MED ORDER — DONEPEZIL HCL 10 MG PO TABS: 10.0000 mg | ORAL_TABLET | Freq: Every day | ORAL | 1 refills | Status: DC

## 2019-07-27 MED ORDER — LEVOTHYROXINE SODIUM 50 MCG PO TABS: 50.0000 ug | ORAL_TABLET | Freq: Every day | ORAL | 1 refills | Status: DC

## 2019-07-27 MED ORDER — IBUPROFEN 800 MG PO TABS: 800.0000 mg | ORAL_TABLET | Freq: Three times a day (TID) | ORAL | 1 refills | Status: DC | PRN

## 2019-07-27 NOTE — Progress Notes (Signed)
Subjective:    Patient ID: Ethan Peters, male    DOB: 1956-07-25, 63 y.o.   MRN: [id number]   HPI: Ethan Peters is a 63 y.o. male presenting for DM check.presents forFollow-up of diabetes. Patient checks blood sugar at home.  Patient denies symptoms such as polyuria, polydipsia, excessive hunger, nausea No significant hypoglycemic spells noted. Medications reviewed. Pt reports taking them regularly without complication/adverse reaction being reported today.   Transfer meds from [facility] to [facility].  Change novolin to novolog 70/30 Glucose readings for the last 10 days: AM = 175, 154, 147, 118, 105, 130, 119, 102, 170, 118, 138, 104 PP=  Lunchtime 120, 155, 117, 184, 138, 128, 144, 138,109, 150, 169  Wants 90 days supply all meds.   Add ibuprofen 800 mg requested for gout.       Depression screen [facility] 2/9 11/18/2018 11/17/2018 08/13/2018 05/09/2018 02/07/2018  Decreased Interest 1 1 0 0 0  Down, Depressed, Hopeless 0 0 0 0 0  PHQ - 2 Score 1 1 0 0 0  Altered sleeping - - - - -  Tired, decreased energy - - - - -  Change in appetite - - - - -  Feeling bad or failure about yourself  - - - - -  Trouble concentrating - - - - -  Moving slowly or fidgety/restless - - - - -  Suicidal thoughts - - - - -  PHQ-9 Score - - - - -     Relevant past medical, surgical, family and social history reviewed and updated as indicated.  Interim medical history since our last visit reviewed. Allergies and medications reviewed and updated.  ROS:  Review of Systems  Constitutional: Negative.   HENT: Negative.   Eyes: Negative for visual disturbance.  Respiratory: Negative for cough and shortness of breath.   Cardiovascular: Negative for chest pain and leg swelling.  Gastrointestinal: Negative for abdominal pain, diarrhea, nausea and vomiting.  Genitourinary: Negative for difficulty urinating.  Musculoskeletal: Negative for arthralgias and myalgias.  Skin: Negative for rash.   Neurological: Negative for headaches.  Psychiatric/Behavioral: Negative for sleep disturbance.     Social History   Tobacco Use  Smoking Status Never Smoker  Smokeless Tobacco Never Used       Objective:     Wt Readings from Last 3 Encounters:  12/18/18 188 lb 6.4 oz (85.5 kg)  11/18/18 194 lb (88 kg)  11/17/18 193 lb (87.5 kg)     Exam deferred. Pt. Harboring due to COVID 19. Phone visit performed.   Assessment & Plan:   1. Diabetic polyneuropathy associated with other specified diabetes mellitus ([place])   2. Coronary artery disease of native artery of native heart with stable angina pectoris ([place])   3. Essential hypertension     Meds ordered this encounter  Medications  . DISCONTD: insulin aspart protamine- aspart (NOVOLOG MIX 70/30) (70-30) 100 UNIT/ML injection    Sig: 44 units before breakfast and 40 units before supper daily    Dispense:  30 mL    Refill:  11  . ibuprofen (ADVIL) 800 MG tablet    Sig: Take 1 tablet (800 mg total) by mouth every 8 (eight) hours as needed for moderate pain.    Dispense:  270 tablet    Refill:  1  . allopurinol (ZYLOPRIM) 300 MG tablet    Sig: TAKE ONE TABLET BY MOUTH ONCE DAILY AS NEEDED FOR GOUT prevention    Dispense:  90 tablet  Refill:  1  . atorvastatin (LIPITOR) 80 MG tablet    Sig: TAKE 1 TABLET BY MOUTH ONCE DAILY AT 6 PM FOR CHOLESTEROL    Dispense:  90 tablet    Refill:  1  . candesartan (ATACAND) 32 MG tablet    Sig: Take 1 tablet (32 mg total) by mouth daily.    Dispense:  90 tablet    Refill:  1  . donepezil (ARICEPT) 10 MG tablet    Sig: Take 1 tablet (10 mg total) by mouth at bedtime.    Dispense:  90 tablet    Refill:  1  . ergocalciferol (VITAMIN D2) 1.25 MG (50000 UT) capsule    Sig: Take 1 capsule (50,000 Units total) by mouth once a week.    Dispense:  13 capsule    Refill:  1  . gabapentin (NEURONTIN) 300 MG capsule    Sig: Take 1 capsule (300 mg total) by mouth 2 (two) times daily.    Dispense:   180 capsule    Refill:  1  . glipiZIDE (GLUCOTROL) 5 MG tablet    Sig: Take 1 tablet (5 mg total) by mouth daily before breakfast.    Dispense:  90 tablet    Refill:  1  . insulin aspart protamine- aspart (NOVOLOG MIX 70/30) (70-30) 100 UNIT/ML injection    Sig: 44 units before breakfast and 40 units before supper daily    Dispense:  90 mL    Refill:  3  . levothyroxine (SYNTHROID) 50 MCG tablet    Sig: Take 1 tablet (50 mcg total) by mouth daily.    Dispense:  90 tablet    Refill:  1  . metFORMIN (GLUCOPHAGE) 1000 MG tablet    Sig: Take 1 tablet (1,000 mg total) by mouth 2 (two) times daily with a meal.    Dispense:  90 tablet    Refill:  1  . traZODone (DESYREL) 150 MG tablet    Sig: Take 1/3 tablet to 1 tablet QHS for sleep.    Dispense:  90 tablet    Refill:  1  . nitroGLYCERIN (NITROSTAT) 0.4 MG SL tablet    Sig: Place 1 tablet (0.4 mg total) under the tongue every 5 (five) minutes x 3 doses as needed for chest pain.    Dispense:  75 tablet    Refill:  3    No orders of the defined types were placed in this encounter.     Diagnoses and all orders for this visit:  Diabetic polyneuropathy associated with other specified diabetes mellitus ([facility])  Coronary artery disease of native artery of native heart with stable angina pectoris ([facility])  Essential hypertension  Other orders -     Discontinue: insulin aspart protamine- aspart (NOVOLOG MIX 70/30) (70-30) 100 UNIT/ML injection; 44 units before breakfast and 40 units before supper daily -     ibuprofen (ADVIL) 800 MG tablet; Take 1 tablet (800 mg total) by mouth every 8 (eight) hours as needed for moderate pain. -     allopurinol (ZYLOPRIM) 300 MG tablet; TAKE ONE TABLET BY MOUTH ONCE DAILY AS NEEDED FOR GOUT prevention -     atorvastatin (LIPITOR) 80 MG tablet; TAKE 1 TABLET BY MOUTH ONCE DAILY AT 6 PM FOR CHOLESTEROL -     candesartan (ATACAND) 32 MG tablet; Take 1 tablet (32 mg total) by mouth daily. -     donepezil  (ARICEPT) 10 MG tablet; Take 1 tablet (10 mg total) by mouth at  bedtime. -     ergocalciferol (VITAMIN D2) 1.25 MG (50000 UT) capsule; Take 1 capsule (50,000 Units total) by mouth once a week. -     gabapentin (NEURONTIN) 300 MG capsule; Take 1 capsule (300 mg total) by mouth 2 (two) times daily. -     glipiZIDE (GLUCOTROL) 5 MG tablet; Take 1 tablet (5 mg total) by mouth daily before breakfast. -     insulin aspart protamine- aspart (NOVOLOG MIX 70/30) (70-30) 100 UNIT/ML injection; 44 units before breakfast and 40 units before supper daily -     levothyroxine (SYNTHROID) 50 MCG tablet; Take 1 tablet (50 mcg total) by mouth daily. -     metFORMIN (GLUCOPHAGE) 1000 MG tablet; Take 1 tablet (1,000 mg total) by mouth 2 (two) times daily with a meal. -     traZODone (DESYREL) 150 MG tablet; Take 1/3 tablet to 1 tablet QHS for sleep. -     nitroGLYCERIN (NITROSTAT) 0.4 MG SL tablet; Place 1 tablet (0.4 mg total) under the tongue every 5 (five) minutes x 3 doses as needed for chest pain.    Virtual Visit via telephone Note  I discussed the limitations, risks, security and privacy concerns of performing an evaluation and management service by telephone and the availability of in person appointments. The patient was identified with two identifiers. Pt.expressed understanding and agreed to proceed. Pt. Is at home.  Son is with him and gives much of the history. Dr. Darlyn Read is in his office.  Follow Up Instructions:   I discussed the assessment and treatment plan with the patient. The patient was provided an opportunity to ask questions and all were answered. The patient agreed with the plan and demonstrated an understanding of the instructions.   The patient was advised to call back or seek an in-person evaluation if the symptoms worsen or if the condition fails to improve as anticipated.   Total minutes including chart review and phone contact time: 24   Follow up plan: No follow-ups on file.   Mechele Claude, MD [place] [facility]

## 2019-07-28 ENCOUNTER — Other Ambulatory Visit: Payer: Self-pay | Admitting: Family Medicine

## 2019-07-28 MED ORDER — ONETOUCH VERIO VI STRP: ORAL_STRIP | 0 refills | Status: DC

## 2019-07-29 MED ORDER — ONETOUCH VERIO VI STRP: ORAL_STRIP | 3 refills | Status: DC

## 2019-07-29 NOTE — Addendum Note (Signed)
Addended by: Antonietta Barcelona D on: 07/29/2019 09:33 AM   Modules accepted: Orders

## 2019-07-30 ENCOUNTER — Other Ambulatory Visit: Payer: Self-pay | Admitting: "Endocrinology

## 2019-07-30 ENCOUNTER — Ambulatory Visit: Payer: PPO | Admitting: "Endocrinology

## 2019-07-30 DIAGNOSIS — E039 Hypothyroidism, unspecified: Secondary | ICD-10-CM

## 2019-07-30 DIAGNOSIS — E1159 Type 2 diabetes mellitus with other circulatory complications: Secondary | ICD-10-CM

## 2019-09-18 NOTE — Progress Notes (Signed)
Ethan Peters Date of Birth: January 01, 1956 Medical Record #[id number]  History of Present Illness: [name] is seen for followup of CAD. He is status post inferior STEMI on 01/09/2013. He had stenting of the RCA at the crux. He had persistent chest pain and dyspnea even after his infarct with atypical symptoms. He subsequently underwent repeat cardiac catheterization on April 10,2014 which showed excellent patency of the stent in the RCA. He does have a long 70% stenosis in the left circumflex,  treated medically. He had a Myovew study in September 2017 which showed an inferior scar without ischemia. EF was 48%.  He has a history of poorly controlled diabetes mellitus with retinopathy and neuropathy.   On follow up today complains that he feels tired. Notes problems losing his balance more. Feels "electriticty" in his veins. Has only had minor chest pain twice in the last 6 months. Notes BS 133 this am.   Current Outpatient Medications on File Prior to Visit  Medication Sig Dispense Refill  . allopurinol (ZYLOPRIM) 300 MG tablet TAKE ONE TABLET BY MOUTH ONCE DAILY AS NEEDED FOR GOUT prevention 90 tablet 1  . aspirin EC 81 MG EC tablet Take 1 tablet (81 mg total) by mouth daily.    Marland Kitchen atorvastatin (LIPITOR) 80 MG tablet TAKE 1 TABLET BY MOUTH ONCE DAILY AT 6 PM FOR CHOLESTEROL 90 tablet 1  . B Complex-C (B-COMPLEX WITH VITAMIN C) tablet Take 1 tablet by mouth as needed (for vitamin deficiency).    . Blood Glucose Monitoring Suppl (BLOOD GLUCOSE MONITOR SYSTEM) w/Device KIT 1 Device by Does not apply route 2 (two) times daily. 1 each 0  . candesartan (ATACAND) 32 MG tablet Take 1 tablet (32 mg total) by mouth daily. 90 tablet 1  . donepezil (ARICEPT) 10 MG tablet Take 1 tablet (10 mg total) by mouth at bedtime. 90 tablet 1  . ergocalciferol (VITAMIN D2) 1.25 MG (50000 UT) capsule Take 1 capsule (50,000 Units total) by mouth once a week. 13 capsule 1  . gabapentin (NEURONTIN) 300 MG capsule Take  1 capsule (300 mg total) by mouth 2 (two) times daily. 180 capsule 1  . glipiZIDE (GLUCOTROL) 5 MG tablet Take 1 tablet (5 mg total) by mouth daily before breakfast. 90 tablet 1  . glucose blood (ONETOUCH VERIO) test strip USE 1 STRIP TO CHECK GLUCOSE 4 TIMES DAILY Dx E13.42 400 each 3  . ibuprofen (ADVIL) 800 MG tablet Take 1 tablet (800 mg total) by mouth every 8 (eight) hours as needed for moderate pain. 270 tablet 1  . insulin aspart protamine- aspart (NOVOLOG MIX 70/30) (70-30) 100 UNIT/ML injection 44 units before breakfast and 40 units before supper daily 90 mL 3  . levothyroxine (SYNTHROID) 50 MCG tablet Take 1 tablet (50 mcg total) by mouth daily. 90 tablet 1  . metFORMIN (GLUCOPHAGE) 1000 MG tablet Take 1 tablet (1,000 mg total) by mouth 2 (two) times daily with a meal. 90 tablet 1  . nitroGLYCERIN (NITROSTAT) 0.4 MG SL tablet Place 1 tablet (0.4 mg total) under the tongue every 5 (five) minutes x 3 doses as needed for chest pain. 75 tablet 3  . traZODone (DESYREL) 150 MG tablet Take 1/3 tablet to 1 tablet QHS for sleep. 90 tablet 1   No current facility-administered medications on file prior to visit.     Allergies  Allergen Reactions  . Levemir [Insulin Detemir] Swelling  . Lisinopril Cough  . Penicillins Rash    Lip swelling  Past Medical History:  Diagnosis Date  . CAD (coronary artery disease) 01/09/13   Inferior STEMI s/p DES-RCA  . Diabetes mellitus    Uncontrolled, Hgb A1C 14.8% on 03/14, started on insulin  . Gout   . Hypertension   . Ischemic cardiomyopathy    EF 33-58%, grade 1 diastolic dysfunction, mildly dilated RV, RA at the upper limits of normal, mild TR, PA systolic pressure 32 mm mercury and hypokinesis to akinesis of the basal mid inferior myocardium  . Myocardial infarction ([place]) 01/09/2013  . Shortness of breath     Past Surgical History:  Procedure Laterality Date  . CARDIAC CATHETERIZATION  01/22/2013   Diffuse borderline residual CAD consistent  with uncontrolled diabetes, medical management recommended  . CORONARY ANGIOPLASTY WITH STENT PLACEMENT  01/09/2013   30% pLAD, 30% mLAD, 70% pLCx, RCA occlusion at crux with R->L distal collaterals s/p DES; LVEF 50%, moderate-severe HK of inferior basal wall  . LEFT HEART CATHETERIZATION WITH CORONARY ANGIOGRAM N/A 01/09/2013   Procedure: LEFT HEART CATHETERIZATION WITH CORONARY ANGIOGRAM;  Surgeon: Peter M Martinique, MD;  Location: [facility] CATH LAB;  Service: Cardiovascular;  Laterality: N/A;  . LEFT HEART CATHETERIZATION WITH CORONARY ANGIOGRAM N/A 01/22/2013   Procedure: LEFT HEART CATHETERIZATION WITH CORONARY ANGIOGRAM;  Surgeon: Sherren Mocha, MD;  Location: [facility] CATH LAB;  Service: Cardiovascular;  Laterality: N/A;    Social History   Tobacco Use  Smoking Status Never Smoker  Smokeless Tobacco Never Used    Social History   Substance and Sexual Activity  Alcohol Use Yes   Comment: 4 times per year    Family History  Problem Relation Age of Onset  . Heart disease Mother   . Heart disease Father   . Heart disease Brother   . Heart disease Brother     Review of Systems: As noted in history of present illness.  All other systems were reviewed and are negative.  Physical Exam: BP 116/82   Pulse (!) 57   Ht _0  (1.676 m)   Wt 185 lb 3.2 oz (84 kg)   BMI 29.89 kg/m  GENERAL:  Well appearing male in NAD HEENT:  PERRL, EOMI, sclera are clear. Oropharynx is clear. NECK:  No jugular venous distention, carotid upstroke brisk and symmetric, no bruits, no thyromegaly or adenopathy LUNGS:  Clear to auscultation bilaterally CHEST:  Unremarkable HEART:  RRR,  PMI not displaced or sustained,S1 and S2 within normal limits, no S3, no S4: no clicks, no rubs, no murmurs ABD:  Soft, nontender. BS +, no masses or bruits. No hepatomegaly, no splenomegaly EXT:  2 + pulses throughout, no edema, no cyanosis no clubbing SKIN:  Warm and dry.  No rashes NEURO:  Alert and oriented x 3. Cranial  nerves II through XII intact. PSYCH:  Cognitively intact      LABORATORY DATA: Lab Results  Component Value Date   WBC 9.3 11/17/2018   HGB 12.9 (L) 11/17/2018   HCT 36.9 (L) 11/17/2018   PLT 192 11/17/2018   GLUCOSE 81 12/15/2018   CHOL 133 12/15/2018   TRIG 150 (H) 12/15/2018   HDL 38 (L) 12/15/2018   LDLCALC 65 12/15/2018   ALT 16 12/15/2018   AST 17 12/15/2018   NA 139 12/15/2018   K 4.4 12/15/2018   CL 103 12/15/2018   CREATININE 1.10 12/15/2018   BUN 20 12/15/2018   CO2 24 12/15/2018   TSH 1.640 12/15/2018   PSA 0.6 09/22/2014   INR 1.16 01/21/2013  HGBA1C 7.6 (H) 12/15/2018   MICROALBUR neg 11/02/2014   Last A1c 8.1% on 04/10/16  Ecg today shows NSR with old inferior infarct. Rate 57. Otherwise normal. I have personally reviewed and interpreted this study.  Myoview 06/26/16:Study Highlights     The left ventricular ejection fraction is mildly decreased (45-54%).  Nuclear stress EF: 48%.  Blood pressure demonstrated a hypertensive response to exercise.  There was no ST segment deviation noted during stress.  Findings consistent with prior myocardial infarction.  This is an intermediate risk study.   Small inferobasal wall infarct no ischemia EF 48%      Assessment / Plan: 1. Coronary disease status post inferior STEMI 2014 treated with DES to the RCA. Repeat cardiac catheterization in 2014 demonstrated continued patency. Moderate diffuse disease in the left circumflex. No ischemia by [facility] September 2017. He is minimally symptomatic with class 1 angina. We will continue aspirin and statin.   2. Diabetes mellitus: On metformin, glipizide, and insulin . Followed by primary care.  3. Dyslipidemia. On statin therapy. Excellent control. Scheduled for follow up labs with primary care in the spring.  4. Hypertension, BP good control on current medications  5. Diabetic retinopathy and neuropathy. I suspect his balance issues are more related to his  neuropathy.   Follow up in 6 months.

## 2019-09-21 ENCOUNTER — Ambulatory Visit (INDEPENDENT_AMBULATORY_CARE_PROVIDER_SITE_OTHER): Payer: PPO | Admitting: Cardiology

## 2019-09-21 ENCOUNTER — Encounter: Payer: Self-pay | Admitting: Cardiology

## 2019-09-21 ENCOUNTER — Other Ambulatory Visit: Payer: Self-pay

## 2019-09-21 VITALS — BP 116/82 | HR 57 | Ht 66.0 in | Wt 185.2 lb

## 2019-09-21 DIAGNOSIS — E0840 Diabetes mellitus due to underlying condition with diabetic neuropathy, unspecified: Secondary | ICD-10-CM | POA: Diagnosis not present

## 2019-09-21 DIAGNOSIS — I2510 Atherosclerotic heart disease of native coronary artery without angina pectoris: Secondary | ICD-10-CM

## 2019-09-21 DIAGNOSIS — Z794 Long term (current) use of insulin: Secondary | ICD-10-CM

## 2019-09-21 DIAGNOSIS — I10 Essential (primary) hypertension: Secondary | ICD-10-CM | POA: Diagnosis not present

## 2019-09-21 DIAGNOSIS — E782 Mixed hyperlipidemia: Secondary | ICD-10-CM | POA: Diagnosis not present

## 2019-09-25 ENCOUNTER — Other Ambulatory Visit: Payer: Self-pay

## 2019-09-25 ENCOUNTER — Ambulatory Visit: Payer: PPO

## 2019-09-30 ENCOUNTER — Ambulatory Visit (INDEPENDENT_AMBULATORY_CARE_PROVIDER_SITE_OTHER): Payer: PPO | Admitting: Family Medicine

## 2019-09-30 ENCOUNTER — Encounter: Payer: Self-pay | Admitting: Family Medicine

## 2019-09-30 ENCOUNTER — Ambulatory Visit (INDEPENDENT_AMBULATORY_CARE_PROVIDER_SITE_OTHER): Payer: PPO

## 2019-09-30 ENCOUNTER — Other Ambulatory Visit: Payer: Self-pay

## 2019-09-30 ENCOUNTER — Telehealth: Payer: Self-pay | Admitting: Family Medicine

## 2019-09-30 DIAGNOSIS — Z794 Long term (current) use of insulin: Secondary | ICD-10-CM | POA: Diagnosis not present

## 2019-09-30 DIAGNOSIS — Z23 Encounter for immunization: Secondary | ICD-10-CM

## 2019-09-30 DIAGNOSIS — E1140 Type 2 diabetes mellitus with diabetic neuropathy, unspecified: Secondary | ICD-10-CM

## 2019-09-30 NOTE — Progress Notes (Signed)
Per Dr. Livia Snellen, give patient Pneumovax vaccine because of diabetes.

## 2019-09-30 NOTE — Progress Notes (Signed)
    Subjective:    Patient ID: Ethan Peters, male    DOB: 1956-02-12, 63 y.o.   MRN: [id number]   HPI: Ethan Peters is a 63 y.o. male presenting for desire to have pneumonia vaccine due to frequent respiratory infections. He is a diabetic and therefore has an indication to receive the vaccine prior to age 9. No complaints. Had his Prevnar 2 years ago.   Depression screen [facility] 2/9 11/18/2018 11/17/2018 08/13/2018 05/09/2018 02/07/2018  Decreased Interest 1 1 0 0 0  Down, Depressed, Hopeless 0 0 0 0 0  PHQ - 2 Score 1 1 0 0 0  Altered sleeping - - - - -  Tired, decreased energy - - - - -  Change in appetite - - - - -  Feeling bad or failure about yourself  - - - - -  Trouble concentrating - - - - -  Moving slowly or fidgety/restless - - - - -  Suicidal thoughts - - - - -  PHQ-9 Score - - - - -     Relevant past medical, surgical, family and social history reviewed and updated as indicated.  Interim medical history since our last visit reviewed. Allergies and medications reviewed and updated.  ROS:  Review of Systems  noncontributory Social History   Tobacco Use  Smoking Status Never Smoker  Smokeless Tobacco Never Used       Objective:     Wt Readings from Last 3 Encounters:  09/21/19 185 lb 3.2 oz (84 kg)  12/18/18 188 lb 6.4 oz (85.5 kg)  11/18/18 194 lb (88 kg)     Exam deferred. Pt. Harboring due to COVID 19. Phone visit performed.   Assessment & Plan:   1. Need for vaccination for Strep pneumoniae   2. Type 2 diabetes mellitus with diabetic neuropathy, with long-term current use of insulin (HCC)     No orders of the defined types were placed in this encounter.   No orders of the defined types were placed in this encounter.   PT. To come to office for pneumovax.  Diagnoses and all orders for this visit:  Need for vaccination for Strep pneumoniae  Type 2 diabetes mellitus with diabetic neuropathy, with long-term current use of insulin  ([place])    Virtual Visit via telephone Note  I discussed the limitations, risks, security and privacy concerns of performing an evaluation and management service by telephone and the availability of in person appointments. The patient was identified with two identifiers. Pt.expressed understanding and agreed to proceed. Pt. Is at home. Dr. Livia Snellen is in his office.  Follow Up Instructions:   I discussed the assessment and treatment plan with the patient. The patient was provided an opportunity to ask questions and all were answered. The patient agreed with the plan and demonstrated an understanding of the instructions.   The patient was advised to call back or seek an in-person evaluation if the symptoms worsen or if the condition fails to improve as anticipated.   Total minutes including chart review and phone contact time: 14   Follow up plan: No follow-ups on file.  Claretta Fraise, MD [place]

## 2019-10-19 ENCOUNTER — Other Ambulatory Visit: Payer: Self-pay | Admitting: Family Medicine

## 2019-10-20 DIAGNOSIS — E113512 Type 2 diabetes mellitus with proliferative diabetic retinopathy with macular edema, left eye: Secondary | ICD-10-CM | POA: Diagnosis not present

## 2019-10-20 DIAGNOSIS — H3582 Retinal ischemia: Secondary | ICD-10-CM | POA: Diagnosis not present

## 2019-10-20 DIAGNOSIS — E113591 Type 2 diabetes mellitus with proliferative diabetic retinopathy without macular edema, right eye: Secondary | ICD-10-CM | POA: Diagnosis not present

## 2019-11-20 ENCOUNTER — Ambulatory Visit (INDEPENDENT_AMBULATORY_CARE_PROVIDER_SITE_OTHER): Payer: PPO | Admitting: *Deleted

## 2019-11-20 DIAGNOSIS — Z0000 Encounter for general adult medical examination without abnormal findings: Secondary | ICD-10-CM | POA: Diagnosis not present

## 2019-11-20 NOTE — Progress Notes (Signed)
MEDICARE ANNUAL WELLNESS VISIT  11/20/2019  Telephone Visit Disclaimer This Medicare AWV was conducted by telephone due to national recommendations for restrictions regarding the COVID-19 Pandemic (e.g. social distancing).  I verified, using two identifiers, that I am speaking with Ethan Peters or their authorized healthcare agent. I discussed the limitations, risks, security, and privacy concerns of performing an evaluation and management service by telephone and the potential availability of an in-person appointment in the future. The patient expressed understanding and agreed to proceed.   Subjective:  Ethan Peters is a 64 y.o. male patient of Stacks, Cletus Gash, MD who had a Medicare Annual Wellness Visit today via telephone. Jimel is Disabled and lives with their spouse and 1 of his sons. he has 16 children. he reports that he is socially active and does interact with friends/family regularly. he is markedly physically active and enjoys fishing, exercising and target practice.  Patient Care Team: Claretta Fraise, MD as PCP - General (Family Medicine) Martinique, Peter M, MD as PCP - Cardiology (Cardiology) Cassandria Anger, MD as Consulting Physician (Endocrinology) Sherlynn Stalls, MD as Consulting Physician (Ophthalmology)  Advanced Directives 11/20/2019 11/18/2018 11/18/2018 01/21/2013 01/10/2013  Does Patient Have a Medical Advance Directive? No - No Patient does not have advance directive Patient does not have advance directive  Would patient like information on creating a medical advance directive? No - Patient declined Yes (MAU/Ambulatory/Procedural Areas - Information given) No - Patient declined - [facility] Utilization Over the Past 12 Months: # of hospitalizations or ER visits: 0 # of surgeries: 0  Review of Systems    Patient reports that his overall health is unchanged compared to last year.  History obtained from chart review  Patient Reported Readings (BP,  Pulse, CBG, Weight, etc) none  Pain Assessment Pain : No/denies pain     Current Medications & Allergies (verified) Allergies as of 11/20/2019      Reactions   Levemir [insulin Detemir] Swelling   Lisinopril Cough   Penicillins Rash   Lip swelling      Medication List       Accurate as of November 20, 2019  2:16 PM. If you have any questions, ask your nurse or doctor.        allopurinol 300 MG tablet Commonly known as: ZYLOPRIM TAKE ONE TABLET BY MOUTH ONCE DAILY AS NEEDED FOR GOUT prevention   aspirin 81 MG EC tablet Take 1 tablet (81 mg total) by mouth daily.   atorvastatin 80 MG tablet Commonly known as: LIPITOR TAKE 1 TABLET BY MOUTH ONCE DAILY AT 6 PM FOR CHOLESTEROL   B-complex with vitamin C tablet Take 1 tablet by mouth as needed (for vitamin deficiency).   Blood Glucose Monitor System w/Device Kit 1 Device by Does not apply route 2 (two) times daily.   candesartan 32 MG tablet Commonly known as: ATACAND Take 1 tablet (32 mg total) by mouth daily.   donepezil 10 MG tablet Commonly known as: ARICEPT Take 1 tablet (10 mg total) by mouth at bedtime.   ergocalciferol 1.25 MG (50000 UT) capsule Commonly known as: VITAMIN D2 Take 1 capsule (50,000 Units total) by mouth once a week.   gabapentin 300 MG capsule Commonly known as: NEURONTIN Take 1 capsule (300 mg total) by mouth 2 (two) times daily.   glipiZIDE 5 MG tablet Commonly known as: GLUCOTROL Take 1 tablet (5 mg total) by mouth daily before breakfast.   ibuprofen 800 MG tablet Commonly  known as: ADVIL Take 1 tablet (800 mg total) by mouth every 8 (eight) hours as needed for moderate pain.   insulin aspart protamine- aspart (70-30) 100 UNIT/ML injection Commonly known as: NovoLOG Mix 70/30 44 units before breakfast and 40 units before supper daily   levothyroxine 50 MCG tablet Commonly known as: SYNTHROID Take 1 tablet (50 mcg total) by mouth daily.   metFORMIN 1000 MG tablet Commonly  known as: GLUCOPHAGE TAKE 1 TABLET (1,000 MG TOTAL) BY MOUTH 2 (TWO) TIMES DAILY WITH A MEAL.   nitroGLYCERIN 0.4 MG SL tablet Commonly known as: NITROSTAT Place 1 tablet (0.4 mg total) under the tongue every 5 (five) minutes x 3 doses as needed for chest pain.   OneTouch Verio test strip Generic drug: glucose blood USE 1 STRIP TO CHECK GLUCOSE 4 TIMES DAILY Dx N39.76   traZODone 150 MG tablet Commonly known as: DESYREL Take 1/3 tablet to 1 tablet QHS for sleep.       History (reviewed): Past Medical History:  Diagnosis Date  . CAD (coronary artery disease) 01/09/13   Inferior STEMI s/p DES-RCA  . Diabetes mellitus    Uncontrolled, Hgb A1C 14.8% on 03/14, started on insulin  . Gout   . Hypertension   . Ischemic cardiomyopathy    EF 73-41%, grade 1 diastolic dysfunction, mildly dilated RV, RA at the upper limits of normal, mild TR, PA systolic pressure 32 mm mercury and hypokinesis to akinesis of the basal mid inferior myocardium  . Myocardial infarction ([place]) 01/09/2013  . Shortness of breath    Past Surgical History:  Procedure Laterality Date  . CARDIAC CATHETERIZATION  01/22/2013   Diffuse borderline residual CAD consistent with uncontrolled diabetes, medical management recommended  . CORONARY ANGIOPLASTY WITH STENT PLACEMENT  01/09/2013   30% pLAD, 30% mLAD, 70% pLCx, RCA occlusion at crux with R->L distal collaterals s/p DES; LVEF 50%, moderate-severe HK of inferior basal wall  . EYE SURGERY Bilateral   . LEFT HEART CATHETERIZATION WITH CORONARY ANGIOGRAM N/A 01/09/2013   Procedure: LEFT HEART CATHETERIZATION WITH CORONARY ANGIOGRAM;  Surgeon: Peter M Martinique, MD;  Location[facility] CATH LAB;  Service: Cardiovascular;  Laterality: N/A;  . LEFT HEART CATHETERIZATION WITH CORONARY ANGIOGRAM N/A 01/22/2013   Procedure: LEFT HEART CATHETERIZATION WITH CORONARY ANGIOGRAM;  Surgeon: Sherren Mocha, MD;  Location: [facility] CATH LAB;  Service: Cardiovascular;  Laterality: N/A;   Family  History  Problem Relation Age of Onset  . Heart disease Mother   . Heart disease Father   . Heart disease Brother   . Heart disease Brother    Social History   Socioeconomic History  . Marital status: Married    Spouse name: Glori Bickers  . Number of children: 73  . Years of education: 30  . Highest education level: Associate degree: occupational, Hotel manager, or vocational program  Occupational History  . Occupation: disabled    Comment: Furniture conservator/restorer   Tobacco Use  . Smoking status: Never Smoker  . Smokeless tobacco: Never Used  Substance and Sexual Activity  . Alcohol use: Yes    Comment: 4 times per year-wine or beer  . Drug use: No  . Sexual activity: Yes    Birth control/protection: None  Other Topics Concern  . Not on file  Social History Narrative   Spanish-speaking. Limited English.    Social Determinants of Health   Financial Resource Strain: Low Risk   . Difficulty of Paying Living Expenses: Not very hard  Food Insecurity: No Food Insecurity  .  Worried About Charity fundraiser in the Last Year: Never true  . Ran Out of Food in the Last Year: Never true  Transportation Needs: No Transportation Needs  . Lack of Transportation (Medical): No  . Lack of Transportation (Non-Medical): No  Physical Activity: Sufficiently Active  . Days of Exercise per Week: 7 days  . Minutes of Exercise per Session: 60 min  Stress: No Stress Concern Present  . Feeling of Stress : Not at all  Social Connections: Somewhat Isolated  . Frequency of Communication with Friends and Family: More than three times a week  . Frequency of Social Gatherings with Friends and Family: More than three times a week  . Attends Religious Services: Never  . Active Member of Clubs or Organizations: No  . Attends Archivist Meetings: Never  . Marital Status: Married    Activities of Daily Living In your present state of health, do you have any difficulty performing the following activities:  11/20/2019  Hearing? Y  Comment feels like his hearing isn't as good as it used to be  Vision? Y  Comment diabetic retinopathy-gets injections in his eyes  Difficulty concentrating or making decisions? Y  Comment is supposed to take Aricept 50m QHS but he doesn't take it regularly  Walking or climbing stairs? N  Dressing or bathing? N  Doing errands, shopping? N  Preparing Food and eating ? N  Using the Toilet? N  In the past six months, have you accidently leaked urine? N  Do you have problems with loss of bowel control? N  Managing your Medications? N  Managing your Finances? N  Housekeeping or managing your Housekeeping? 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   Depression Screen PHQ 2/9 Scores 11/20/2019 11/18/2018 11/17/2018 08/13/2018 05/09/2018 02/07/2018 11/08/2017  PHQ - 2 Score 0 1 1 0 0 0 0  PHQ- 9 Score - - - - - - -  Exception Documentation - - - - - - -     Fall Risk Fall Risk  11/20/2019 11/18/2018 08/13/2018 05/09/2018 02/07/2018  Falls in the past year? 0 0 No No No  Number falls in past yr: - - - - -     Objective:  EMarshell Levanseemed alert and oriented and he participated appropriately  during our telephone visit.  Blood Pressure Weight BMI  BP Readings from Last 3 Encounters:  09/21/19 116/82  03/30/19 (!) 149/82  12/18/18 127/75   Wt Readings from Last 3 Encounters:  09/21/19 185 lb 3.2 oz (84 kg)  12/18/18 188 lb 6.4 oz (85.5 kg)  11/18/18 194 lb (88 kg)   BMI Readings from Last 1 Encounters:  09/21/19 29.89 kg/m    *  Unable to obtain current vital signs, weight, and BMI due to telephone visit type  Hearing/Vision  . Stefan did not seem to have difficulty with hearing/understanding during the telephone conversation . Reports that he has had a formal eye exam by an eye care professional within the past year . Reports that he has not had a formal hearing evaluation within the past year *Unable to fully assess hearing and vision during telephone visit type  Cognitive Function: 6CIT Screen 11/20/2019  What Year? 0 points  What month? 0 points  What time? 0 points  Count back from 20 0 points  Months in reverse 0 points  Repeat phrase 2 points  Total Score 2   (Normal:0-7, Significant for Dysfunction: >8)  Normal Cognitive Function Screening: Yes   Immunization & Health Maintenance Record Immunization History  Administered Date(s) Administered  . Influenza Inj Mdck Quad With Preservative 07/28/2019  . Influenza,inj,Quad PF,6+ Mos 08/22/2015, 07/27/2016, 07/16/2017, 08/13/2018  . Pneumococcal Conjugate-13 11/27/2016  . Pneumococcal Polysaccharide-23 09/25/2007, 08/13/2018, 09/30/2019  . Tdap 03/31/2012    Health Maintenance  Topic Date Due  . Hepatitis C Screening  04/11/1956  . HIV Screening  08/24/1970  . COLONOSCOPY  08/24/2005  . HEMOGLOBIN A1C  06/17/2019  . OPHTHALMOLOGY EXAM  10/30/2019  . FOOT EXAM  11/18/2019  . TETANUS/TDAP  03/31/2022  . INFLUENZA VACCINE  Completed       Assessment  This is a routine wellness examination for GREYSEN DEVINO.  Health Maintenance: Due or Overdue Health Maintenance Due  Topic Date Due  .  Hepatitis C Screening  01-Dec-1955  . HIV Screening  08/24/1970  . COLONOSCOPY  08/24/2005  . HEMOGLOBIN A1C  06/17/2019  . OPHTHALMOLOGY EXAM  10/30/2019  . FOOT EXAM  11/18/2019    Ethan Peters does not need a referral for Community Assistance: Care Management:   no Social Work:    no Prescription Assistance:  no Nutrition/Diabetes Education:  no   Plan:  Personalized Goals Goals Addressed            This Visit's Progress   . DIET - INCREASE WATER INTAKE       Try to drink 6-8 glasses of water daily      Personalized Health Maintenance & Screening Recommendations  Colorectal cancer screening Shingles  Lung Cancer Screening Recommended: no (Low Dose CT Chest recommended if Age 68-80 years, 30 pack-year currently smoking OR have quit w/in past 15 years) Hepatitis C Screening recommended: yes-will offer at next visit with PCP HIV Screening recommended: yes-will offer at next visit with PCP  Advanced Directives: Written information was not prepared per patient's request.  Referrals & Orders No orders of the defined types were placed in this encounter.   Follow-up Plan . Follow-up with Claretta Fraise, MD as planned . Discuss Shingrix vaccine with your pharmacist due to your insurance . Consider Colonoscopy for Colorectal Cancer Screening-let us know if you need a referral   I have personally reviewed and noted the following in the patient's chart:   . Medical and social history . Use of alcohol, tobacco or illicit drugs  . Current medications and supplements . Functional ability and status . Nutritional status . Physical activity . Advanced directives . List of other physicians . Hospitalizations, surgeries, and ER visits in previous 12 months . Vitals . Screenings to include cognitive, depression, and falls . Referrals and appointments  In addition, I have reviewed and discussed with Ethan Peters certain preventive protocols, quality  metrics, and  best practice recommendations. A written personalized care plan for preventive services as well as general preventive health recommendations is available and can be mailed to the patient at his request.      Milas Hock, LPN  10/20/5798

## 2019-11-20 NOTE — Patient Instructions (Signed)
Preventive Care 41-65 Years Old, Male Preventive care refers to lifestyle choices and visits with your health care provider that can promote health and wellness. This includes:  A yearly physical exam. This is also called an annual well check.  Regular dental and eye exams.  Immunizations.  Screening for certain conditions.  Healthy lifestyle choices, such as eating a healthy diet, getting regular exercise, not using drugs or products that contain nicotine and tobacco, and limiting alcohol use. What can I expect for my preventive care visit? Physical exam Your health care provider will check:  Height and weight. These may be used to calculate body mass index (BMI), which is a measurement that tells if you are at a healthy weight.  Heart rate and blood pressure.  Your skin for abnormal spots. Counseling Your health care provider may ask you questions about:  Alcohol, tobacco, and drug use.  Emotional well-being.  Home and relationship well-being.  Sexual activity.  Eating habits.  Work and work Statistician. What immunizations do I need?  Influenza (flu) vaccine  This is recommended every year. Tetanus, diphtheria, and pertussis (Tdap) vaccine  You may need a Td booster every 10 years. Varicella (chickenpox) vaccine  You may need this vaccine if you have not already been vaccinated. Zoster (shingles) vaccine  You may need this after age 64. Measles, mumps, and rubella (MMR) vaccine  You may need at least one dose of MMR if you were born in 1957 or later. You may also need a second dose. Pneumococcal conjugate (PCV13) vaccine  You may need this if you have certain conditions and were not previously vaccinated. Pneumococcal polysaccharide (PPSV23) vaccine  You may need one or two doses if you smoke cigarettes or if you have certain conditions. Meningococcal conjugate (MenACWY) vaccine  You may need this if you have certain conditions. Hepatitis A  vaccine  You may need this if you have certain conditions or if you travel or work in places where you may be exposed to hepatitis A. Hepatitis B vaccine  You may need this if you have certain conditions or if you travel or work in places where you may be exposed to hepatitis B. Haemophilus influenzae type b (Hib) vaccine  You may need this if you have certain risk factors. Human papillomavirus (HPV) vaccine  If recommended by your health care provider, you may need three doses over 6 months. You may receive vaccines as individual doses or as more than one vaccine together in one shot (combination vaccines). Talk with your health care provider about the risks and benefits of combination vaccines. What tests do I need? Blood tests  Lipid and cholesterol levels. These may be checked every 5 years, or more frequently if you are over 60 years old.  Hepatitis C test.  Hepatitis B test. Screening  Lung cancer screening. You may have this screening every year starting at age 43 if you have a 30-pack-year history of smoking and currently smoke or have quit within the past 15 years.  Prostate cancer screening. Recommendations will vary depending on your family history and other risks.  Colorectal cancer screening. All adults should have this screening starting at age 72 and continuing until age 2. Your health care provider may recommend screening at age 14 if you are at increased risk. You will have tests every 1-10 years, depending on your results and the type of screening test.  Diabetes screening. This is done by checking your blood sugar (glucose) after you have not eaten  for a while (fasting). You may have this done every 1-3 years.  Sexually transmitted disease (STD) testing. Follow these instructions at home: Eating and drinking  Eat a diet that includes fresh fruits and vegetables, whole grains, lean protein, and low-fat dairy products.  Take vitamin and mineral supplements as  recommended by your health care provider.  Do not drink alcohol if your health care provider tells you not to drink.  If you drink alcohol: ? Limit how much you have to 0-2 drinks a day. ? Be aware of how much alcohol is in your drink. In the [place] one drink equals one 12 oz bottle of beer (355 mL), one 5 oz glass of wine (148 mL), or one 1 oz glass of hard liquor (44 mL). Lifestyle  Take daily care of your teeth and gums.  Stay active. Exercise for at least 30 minutes on 5 or more days each week.  Do not use any products that contain nicotine or tobacco, such as cigarettes, e-cigarettes, and chewing tobacco. If you need help quitting, ask your health care provider.  If you are sexually active, practice safe sex. Use a condom or other form of protection to prevent STIs (sexually transmitted infections).  Talk with your health care provider about taking a low-dose aspirin every day starting at age 53. What's next?  Go to your health care provider once a year for a well check visit.  Ask your health care provider how often you should have your eyes and teeth checked.  Stay up to date on all vaccines. This information is not intended to replace advice given to you by your health care provider. Make sure you discuss any questions you have with your health care provider. Document Revised: 09/25/2018 Document Reviewed: 09/25/2018 Elsevier Patient Education  2020 [employer].

## 2019-11-26 ENCOUNTER — Telehealth: Payer: Self-pay | Admitting: Family Medicine

## 2019-11-26 NOTE — Chronic Care Management (AMB) (Signed)
Chronic Care Management   Note  11/26/2019 Name: Ethan Peters MRN: [id number] DOB: August 05, 1956  Ethan Peters is a 64 y.o. year old male who is a primary care patient of Stacks, Cletus Gash, MD. I reached out to [name] by phone today in response to a referral sent by Ethan Peters health plan.     Ethan Peters was given information about Chronic Care Management services today including:  1. CCM service includes personalized support from designated clinical staff supervised by his physician, including individualized plan of care and coordination with other care providers 2. 24/7 contact phone numbers for assistance for urgent and routine care needs. 3. Service will only be billed when office clinical staff spend 20 minutes or more in a month to coordinate care. 4. Only one practitioner may furnish and bill the service in a calendar month. 5. The patient may stop CCM services at any time (effective at the end of the month) by phone call to the office staff. 6. The patient will be responsible for cost sharing (co-pay) of up to 20% of the service fee (after annual deductible is met).  Patient did not agree to enrollment in care management services and does not wish to consider at this time.  Follow up plan: The patient has been provided with contact information for the care management team and has been advised to call with any health related questions or concerns.   Noreene Larsson, [place], [place], [place] [address] Direct Dial: [phone] [email or web address] Website: [email or web address]

## 2019-12-25 ENCOUNTER — Other Ambulatory Visit: Payer: Self-pay | Admitting: Family Medicine

## 2020-01-20 ENCOUNTER — Other Ambulatory Visit: Payer: Self-pay | Admitting: Family Medicine

## 2020-01-28 ENCOUNTER — Other Ambulatory Visit: Payer: Self-pay

## 2020-01-28 ENCOUNTER — Ambulatory Visit (INDEPENDENT_AMBULATORY_CARE_PROVIDER_SITE_OTHER): Payer: PPO | Admitting: Family Medicine

## 2020-01-28 ENCOUNTER — Encounter: Payer: Self-pay | Admitting: Family Medicine

## 2020-01-28 VITALS — BP 116/71 | HR 66 | Temp 98.0°F | Resp 20 | Ht 66.0 in | Wt 184.1 lb

## 2020-01-28 DIAGNOSIS — Z114 Encounter for screening for human immunodeficiency virus [HIV]: Secondary | ICD-10-CM | POA: Diagnosis not present

## 2020-01-28 DIAGNOSIS — Z794 Long term (current) use of insulin: Secondary | ICD-10-CM

## 2020-01-28 DIAGNOSIS — I10 Essential (primary) hypertension: Secondary | ICD-10-CM | POA: Diagnosis not present

## 2020-01-28 DIAGNOSIS — E782 Mixed hyperlipidemia: Secondary | ICD-10-CM | POA: Diagnosis not present

## 2020-01-28 DIAGNOSIS — E039 Hypothyroidism, unspecified: Secondary | ICD-10-CM | POA: Diagnosis not present

## 2020-01-28 DIAGNOSIS — Z1159 Encounter for screening for other viral diseases: Secondary | ICD-10-CM | POA: Diagnosis not present

## 2020-01-28 DIAGNOSIS — E1140 Type 2 diabetes mellitus with diabetic neuropathy, unspecified: Secondary | ICD-10-CM

## 2020-01-28 LAB — BAYER DCA HB A1C WAIVED: HB A1C (BAYER DCA - WAIVED): 7.4 % — ABNORMAL HIGH (ref <7.0)

## 2020-01-28 MED ORDER — MIRTAZAPINE 7.5 MG PO TABS: 7.5000 mg | ORAL_TABLET | Freq: Every day | ORAL | 2 refills | Status: DC

## 2020-01-28 NOTE — Progress Notes (Signed)
Subjective:  Patient ID: Ethan Peters,  male    DOB: [birth year]  Age: 64 y.o.    CC: Medical Management of Chronic Issues   HPI Ethan Peters presents for  follow-up of hypertension. Patient has no history of headache chest pain or shortness of breath or recent cough. Patient also denies symptoms of TIA such as numbness weakness lateralizing. Patient denies side effects from medication. States taking it regularly.  Patient also  in for follow-up of elevated cholesterol. Doing well without complaints on current medication. Denies side effects  including myalgia and arthralgia and nausea. Also in today for liver function testing. Currently no chest pain, shortness of breath or other cardiovascular related symptoms noted.  Follow-up of diabetes. Patient does check blood sugar at home. Readings run between 46 and 150 Patient denies symptoms such as excessive hunger or urinary frequency, excessive hunger, nausea Medications reviewed. Pt reports taking them regularly. Pt. denies complication/adverse reaction today.    History Ethan Peters has a past medical history of CAD (coronary artery disease) (01/09/13), Diabetes mellitus, Gout, Hypertension, Ischemic cardiomyopathy, Myocardial infarction ([place]) (01/09/2013), and Shortness of breath.   Ethan Peters has a past surgical history that includes Coronary angioplasty with stent (01/09/2013); Cardiac catheterization (01/22/2013); left heart catheterization with coronary angiogram (N/A, 01/09/2013); left heart catheterization with coronary angiogram (N/A, 01/22/2013); and Eye surgery (Bilateral).   His family history includes Heart disease in his brother, brother, father, and mother.Ethan Peters reports that Ethan Peters has never smoked. Ethan Peters has never used smokeless tobacco. Ethan Peters reports current alcohol use. Ethan Peters reports that Ethan Peters does not use drugs.  Current Outpatient Medications on File Prior to Visit  Medication Sig Dispense Refill  . allopurinol (ZYLOPRIM) 300 MG tablet TAKE ONE  TABLET BY MOUTH ONCE DAILY AS NEEDED FOR GOUT prevention 90 tablet 1  . aspirin EC 81 MG EC tablet Take 1 tablet (81 mg total) by mouth daily.    Marland Kitchen atorvastatin (LIPITOR) 80 MG tablet TAKE 1 TABLET BY MOUTH ONCE DAILY AT 6 PM FOR CHOLESTEROL 90 tablet 0  . B Complex-C (B-COMPLEX WITH VITAMIN C) tablet Take 1 tablet by mouth as needed (for vitamin deficiency).    . Blood Glucose Monitoring Suppl (BLOOD GLUCOSE MONITOR SYSTEM) w/Device KIT 1 Device by Does not apply route 2 (two) times daily. 1 each 0  . candesartan (ATACAND) 32 MG tablet TAKE 1 TABLET (32 MG TOTAL) BY MOUTH DAILY. 90 tablet 0  . ergocalciferol (VITAMIN D2) 1.25 MG (50000 UT) capsule Take 1 capsule (50,000 Units total) by mouth once a week. 13 capsule 1  . gabapentin (NEURONTIN) 300 MG capsule Take 1 capsule (300 mg total) by mouth 2 (two) times daily. 180 capsule 1  . glipiZIDE (GLUCOTROL) 5 MG tablet Take 1 tablet (5 mg total) by mouth daily before breakfast. 90 tablet 1  . glucose blood (ONETOUCH VERIO) test strip USE 1 STRIP TO CHECK GLUCOSE 4 TIMES DAILY Dx E13.42 400 each 3  . ibuprofen (ADVIL) 800 MG tablet Take 1 tablet (800 mg total) by mouth every 8 (eight) hours as needed for moderate pain. 270 tablet 1  . insulin aspart protamine- aspart (NOVOLOG MIX 70/30) (70-30) 100 UNIT/ML injection 44 units before breakfast and 40 units before supper daily 90 mL 3  . levothyroxine (SYNTHROID) 50 MCG tablet TAKE 1 TABLET BY MOUTH EVERY DAY 90 tablet 0  . metFORMIN (GLUCOPHAGE) 1000 MG tablet TAKE 1 TABLET (1,000 MG TOTAL) BY MOUTH 2 (TWO) TIMES DAILY WITH A MEAL. 180 tablet  0  . nitroGLYCERIN (NITROSTAT) 0.4 MG SL tablet Place 1 tablet (0.4 mg total) under the tongue every 5 (five) minutes x 3 doses as needed for chest pain. 75 tablet 3   No current facility-administered medications on file prior to visit.    ROS Review of Systems  Constitutional: Negative.   HENT: Negative.   Eyes: Negative for visual disturbance.  Respiratory:  Negative for cough and shortness of breath.   Cardiovascular: Negative for chest pain and leg swelling.  Gastrointestinal: Negative for abdominal pain, diarrhea, nausea and vomiting.  Genitourinary: Negative for difficulty urinating.  Musculoskeletal: Negative for arthralgias and myalgias.  Skin: Negative for rash.  Neurological: Negative for headaches.  Psychiatric/Behavioral: Negative for sleep disturbance.    Objective:  BP 116/71   Pulse 66   Temp 98 F (36.7 C) (Temporal)   Resp 20   Ht _0  (1.676 m)   Wt 184 lb 2 oz (83.5 kg)   SpO2 98%   BMI 29.72 kg/m   BP Readings from Last 3 Encounters:  01/28/20 116/71  09/21/19 116/82  03/30/19 (!) 149/82    Wt Readings from Last 3 Encounters:  01/28/20 184 lb 2 oz (83.5 kg)  09/21/19 185 lb 3.2 oz (84 kg)  12/18/18 188 lb 6.4 oz (85.5 kg)     Physical Exam Constitutional:      General: Ethan Peters is not in acute distress.    Appearance: Ethan Peters is well-developed.  HENT:     Head: Normocephalic and atraumatic.     Right Ear: External ear normal.     Left Ear: External ear normal.     Nose: Nose normal.  Eyes:     Conjunctiva/sclera: Conjunctivae normal.     Pupils: Pupils are equal, round, and reactive to light.  Cardiovascular:     Rate and Rhythm: Normal rate and regular rhythm.     Heart sounds: Normal heart sounds. No murmur.  Pulmonary:     Effort: Pulmonary effort is normal. No respiratory distress.     Breath sounds: Normal breath sounds. No wheezing or rales.  Abdominal:     Palpations: Abdomen is soft.     Tenderness: There is no abdominal tenderness.  Musculoskeletal:        General: Normal range of motion.     Cervical back: Normal range of motion and neck supple.  Skin:    General: Skin is warm and dry.  Neurological:     Mental Status: Ethan Peters is alert and oriented to person, place, and time.     Deep Tendon Reflexes: Reflexes are normal and symmetric.  Psychiatric:        Behavior: Behavior normal.         Thought Content: Thought content normal.        Judgment: Judgment normal.     Diabetic Foot Exam - Simple   Simple Foot Form Diabetic Foot exam was performed with the following findings: Yes 01/28/2020 10:00 AM  Visual Inspection No deformities, no ulcerations, no other skin breakdown bilaterally: Yes Sensation Testing Intact to touch and monofilament testing bilaterally: Yes Pulse Check Posterior Tibialis and Dorsalis pulse intact bilaterally: Yes Comments       Assessment & Plan:   Ethan Peters was seen today for medical management of chronic issues.  Diagnoses and all orders for this visit:  Type 2 diabetes mellitus with diabetic neuropathy, with long-term current use of insulin ([place]) -     CBC with Differential/Platelet -     CMP14+EGFR -  Lipid panel -     Bayer DCA Hb A1c Waived  Essential hypertension -     CBC with Differential/Platelet -     CMP14+EGFR -     Lipid panel  Mixed hyperlipidemia -     CBC with Differential/Platelet -     CMP14+EGFR -     Lipid panel  Hypothyroidism, unspecified type -     Thyroid Panel With TSH  Encounter for screening for HIV -     HIV Antibody (routine testing w rflx)  Need for hepatitis C screening test -     Hepatitis C antibody  Other orders -     mirtazapine (REMERON) 7.5 MG tablet; Take 1 tablet (7.5 mg total) by mouth at bedtime. For sleep   I have discontinued Yasir E. Magadan's donepezil and traZODone. I am also having him start on mirtazapine. Additionally, I am having him maintain his aspirin, B-complex with vitamin C, Blood Glucose Monitor System, ibuprofen, allopurinol, ergocalciferol, gabapentin, glipiZIDE, insulin aspart protamine- aspart, nitroGLYCERIN, OneTouch Verio, atorvastatin, candesartan, levothyroxine, and metFORMIN.  Meds ordered this encounter  Medications  . mirtazapine (REMERON) 7.5 MG tablet    Sig: Take 1 tablet (7.5 mg total) by mouth at bedtime. For sleep    Dispense:  30 tablet     Refill:  2     Follow-up: Return in about 3 months (around 04/28/2020).  Claretta Fraise, M.D.

## 2020-01-29 LAB — CBC WITH DIFFERENTIAL/PLATELET
Basophils Absolute: 0.1 10*3/uL (ref 0.0–0.2)
Basos: 1 %
EOS (ABSOLUTE): 0.2 10*3/uL (ref 0.0–0.4)
Eos: 3 %
Hematocrit: 37.8 % (ref 37.5–51.0)
Hemoglobin: 12.5 g/dL — ABNORMAL LOW (ref 13.0–17.7)
Immature Grans (Abs): 0 10*3/uL (ref 0.0–0.1)
Immature Granulocytes: 0 %
Lymphocytes Absolute: 2.4 10*3/uL (ref 0.7–3.1)
Lymphs: 34 %
MCH: 31 pg (ref 26.6–33.0)
MCHC: 33.1 g/dL (ref 31.5–35.7)
MCV: 94 fL (ref 79–97)
Monocytes Absolute: 0.6 10*3/uL (ref 0.1–0.9)
Monocytes: 8 %
Neutrophils Absolute: 3.9 10*3/uL (ref 1.4–7.0)
Neutrophils: 54 %
Platelets: 199 10*3/uL (ref 150–450)
RBC: 4.03 x10E6/uL — ABNORMAL LOW (ref 4.14–5.80)
RDW: 12.5 % (ref 11.6–15.4)
WBC: 7.1 10*3/uL (ref 3.4–10.8)

## 2020-01-29 LAB — LIPID PANEL
Chol/HDL Ratio: 3 ratio (ref 0.0–5.0)
Cholesterol, Total: 91 mg/dL — ABNORMAL LOW (ref 100–199)
HDL: 30 mg/dL — ABNORMAL LOW (ref >39)
LDL Chol Calc (NIH): 42 mg/dL (ref 0–99)
Triglycerides: 101 mg/dL (ref 0–149)
VLDL Cholesterol Cal: 19 mg/dL (ref 5–40)

## 2020-01-29 LAB — THYROID PANEL WITH TSH
Free Thyroxine Index: 2.3 (ref 1.2–4.9)
T3 Uptake Ratio: 34 % (ref 24–39)
T4, Total: 6.9 ug/dL (ref 4.5–12.0)
TSH: 2.3 u[IU]/mL (ref 0.450–4.500)

## 2020-01-29 LAB — CMP14+EGFR
ALT: 10 IU/L (ref 0–44)
AST: 13 IU/L (ref 0–40)
Albumin/Globulin Ratio: 1.8 (ref 1.2–2.2)
Albumin: 4.5 g/dL (ref 3.8–4.8)
Alkaline Phosphatase: 70 IU/L (ref 39–117)
BUN/Creatinine Ratio: 21 (ref 10–24)
BUN: 22 mg/dL (ref 8–27)
Bilirubin Total: 0.2 mg/dL (ref 0.0–1.2)
CO2: 21 mmol/L (ref 20–29)
Calcium: 8.9 mg/dL (ref 8.6–10.2)
Chloride: 103 mmol/L (ref 96–106)
Creatinine, Ser: 1.07 mg/dL (ref 0.76–1.27)
GFR calc Af Amer: 84 mL/min/{1.73_m2} (ref >59)
GFR calc non Af Amer: 73 mL/min/{1.73_m2} (ref >59)
Globulin, Total: 2.5 g/dL (ref 1.5–4.5)
Glucose: 165 mg/dL — ABNORMAL HIGH (ref 65–99)
Potassium: 4.4 mmol/L (ref 3.5–5.2)
Sodium: 139 mmol/L (ref 134–144)
Total Protein: 7 g/dL (ref 6.0–8.5)

## 2020-01-29 LAB — HEPATITIS C ANTIBODY: Hep C Virus Ab: <0.1 s/co ratio (ref 0.0–0.9)

## 2020-01-29 LAB — HIV ANTIBODY (ROUTINE TESTING W REFLEX): HIV Screen 4th Generation wRfx: NONREACTIVE

## 2020-01-30 NOTE — Progress Notes (Signed)
Hello  Ethan Peters,    Your lab result is normal and/or stable.Some minor variations that are not significant are commonly marked abnormal, but do not represent any medical problem for you.   Best regards,  Mechele Claude, M.D.

## 2020-03-23 NOTE — Progress Notes (Signed)
Virtual Visit via Telephone Note   This visit type was conducted due to national recommendations for restrictions regarding the COVID-19 Pandemic (e.g. social distancing) in an effort to limit this patient's exposure and mitigate transmission in our community.  Due to his co-morbid illnesses, this patient is at least at moderate risk for complications without adequate follow up.  This format is felt to be most appropriate for this patient at this time.  The patient did not have access to video technology/had technical difficulties with video requiring transitioning to audio format only (telephone).  All issues noted in this document were discussed and addressed.  No physical exam could be performed with this format.  Please refer to the patient's chart for his  consent to telehealth for [facility].   The patient was identified using 2 identifiers.  Date:  03/24/2020   ID:  Ethan Peters, DOB [birth year], MRN [id number]  Patient Location: Home Provider Location: Home  PCP:  Claretta Fraise, MD  Cardiologist:  Zuleima Haser Martinique, MD  Electrophysiologist:  None   Evaluation Performed:  Follow-Up Visit  Chief Complaint:  CAD  History of Present Illness:    Ethan Peters is a 64 y.o. male with a history of CAD. He is status post inferior STEMI on 01/09/2013. He had stenting of the RCA at the crux. He had persistent chest pain and dyspnea even after his infarct with atypical symptoms. He subsequently underwent repeat cardiac catheterization on April 10,2014 which showed excellent patency of the stent in the RCA. He does have a long 70% stenosis in the left circumflex,  treated medically. He had a Myovew study in September 2017 which showed an inferior scar without ischemia. EF was 48%.  He has a history of poorly controlled diabetes mellitus with retinopathy and neuropathy.   His major complaint today is of poor vision. He can no longer drive. Denies any chest pain. Notes his BP is low on  candesartan 32 mg and he has been cutting it in half. With this BP is OK. Reports sugars are well controlled.   The patient does not have symptoms concerning for COVID-19 infection (fever, chills, cough, or new shortness of breath).    Past Medical History:  Diagnosis Date  . CAD (coronary artery disease) 01/09/13   Inferior STEMI s/p DES-RCA  . Diabetes mellitus    Uncontrolled, Hgb A1C 14.8% on 03/14, started on insulin  . Gout   . Hypertension   . Ischemic cardiomyopathy    EF 24-58%, grade 1 diastolic dysfunction, mildly dilated RV, RA at the upper limits of normal, mild TR, PA systolic pressure 32 mm mercury and hypokinesis to akinesis of the basal mid inferior myocardium  . Myocardial infarction ([place]) 01/09/2013  . Shortness of breath    Past Surgical History:  Procedure Laterality Date  . CARDIAC CATHETERIZATION  01/22/2013   Diffuse borderline residual CAD consistent with uncontrolled diabetes, medical management recommended  . CORONARY ANGIOPLASTY WITH STENT PLACEMENT  01/09/2013   30% pLAD, 30% mLAD, 70% pLCx, RCA occlusion at crux with R->L distal collaterals s/p DES; LVEF 50%, moderate-severe HK of inferior basal wall  . EYE SURGERY Bilateral   . LEFT HEART CATHETERIZATION WITH CORONARY ANGIOGRAM N/A 01/09/2013   Procedure: LEFT HEART CATHETERIZATION WITH CORONARY ANGIOGRAM;  Surgeon: Da Michelle M Martinique, MD;  Location: [facility] CATH LAB;  Service: Cardiovascular;  Laterality: N/A;  . LEFT HEART CATHETERIZATION WITH CORONARY ANGIOGRAM N/A 01/22/2013   Procedure: LEFT HEART CATHETERIZATION WITH CORONARY ANGIOGRAM;  Surgeon: Sherren Mocha, MD;  Location: [facility] CATH LAB;  Service: Cardiovascular;  Laterality: N/A;     Current Meds  Medication Sig  . allopurinol (ZYLOPRIM) 300 MG tablet TAKE ONE TABLET BY MOUTH ONCE DAILY AS NEEDED FOR GOUT prevention  . aspirin EC 81 MG EC tablet Take 1 tablet (81 mg total) by mouth daily.  Marland Kitchen atorvastatin (LIPITOR) 80 MG tablet TAKE 1 TABLET BY MOUTH ONCE  DAILY AT 6 PM FOR CHOLESTEROL  . B Complex-C (B-COMPLEX WITH VITAMIN C) tablet Take 1 tablet by mouth as needed (for vitamin deficiency).  . Blood Glucose Monitoring Suppl (BLOOD GLUCOSE MONITOR SYSTEM) w/Device KIT 1 Device by Does not apply route 2 (two) times daily.  . ergocalciferol (VITAMIN D2) 1.25 MG (50000 UT) capsule Take 1 capsule (50,000 Units total) by mouth once a week.  Marland Kitchen glipiZIDE (GLUCOTROL) 5 MG tablet TAKE 1 TABLET (5 MG TOTAL) BY MOUTH DAILY BEFORE BREAKFAST.  Marland Kitchen glucose blood (ONETOUCH VERIO) test strip USE 1 STRIP TO CHECK GLUCOSE 4 TIMES DAILY Dx H41.93  . ibuprofen (ADVIL) 800 MG tablet Take 1 tablet (800 mg total) by mouth every 8 (eight) hours as needed for moderate pain.  Marland Kitchen insulin aspart protamine- aspart (NOVOLOG MIX 70/30) (70-30) 100 UNIT/ML injection 44 units before breakfast and 40 units before supper daily  . levothyroxine (SYNTHROID) 50 MCG tablet TAKE 1 TABLET BY MOUTH EVERY DAY  . metFORMIN (GLUCOPHAGE) 1000 MG tablet TAKE 1 TABLET (1,000 MG TOTAL) BY MOUTH 2 (TWO) TIMES DAILY WITH A MEAL.  . nitroGLYCERIN (NITROSTAT) 0.4 MG SL tablet Place 1 tablet (0.4 mg total) under the tongue every 5 (five) minutes x 3 doses as needed for chest pain.  . [DISCONTINUED] candesartan (ATACAND) 32 MG tablet TAKE 1 TABLET (32 MG TOTAL) BY MOUTH DAILY.  . [DISCONTINUED] mirtazapine (REMERON) 7.5 MG tablet Take 1 tablet (7.5 mg total) by mouth at bedtime. For sleep     Allergies:   Levemir [insulin detemir], Lisinopril, Trazodone and nefazodone, and Penicillins   Social History   Tobacco Use  . Smoking status: Never Smoker  . Smokeless tobacco: Never Used  Vaping Use  . Vaping Use: Never used  Substance Use Topics  . Alcohol use: Yes    Comment: 4 times per year-wine or beer  . Drug use: No     Family Hx: The patient's family history includes Heart disease in his brother, brother, father, and mother.  ROS:   Please see the history of present illness.     All other  systems reviewed and are negative.   Prior CV studies:   The following studies were reviewed today:  Myoview 06/26/16:Study Highlights     The left ventricular ejection fraction is mildly decreased (45-54%).  Nuclear stress EF: 48%.  Blood pressure demonstrated a hypertensive response to exercise.  There was no ST segment deviation noted during stress.  Findings consistent with prior myocardial infarction.  This is an intermediate risk study.  Small inferobasal wall infarct no ischemia EF 48%      Labs/Other Tests and Data Reviewed:    EKG:  No ECG reviewed.  Recent Labs: 01/28/2020: ALT 10; BUN 22; Creatinine, Ser 1.07; Hemoglobin 12.5; Platelets 199; Potassium 4.4; Sodium 139; TSH 2.300   Recent Lipid Panel Lab Results  Component Value Date/Time   CHOL 91 (L) 01/28/2020 08:29 AM   TRIG 101 01/28/2020 08:29 AM   TRIG 77 06/01/2013 08:41 AM   HDL 30 (L) 01/28/2020 08:29 AM  HDL 44 06/01/2013 08:41 AM   CHOLHDL 3.0 01/28/2020 08:29 AM   CHOLHDL 2.9 06/20/2016 09:23 AM   LDLCALC 42 01/28/2020 08:29 AM   LDLCALC 45 06/01/2013 08:41 AM    Wt Readings from Last 3 Encounters:  03/24/20 185 lb (83.9 kg)  01/28/20 184 lb 2 oz (83.5 kg)  09/21/19 185 lb 3.2 oz (84 kg)     Objective:    Vital Signs:  Ht _0  (1.676 m)   Wt 185 lb (83.9 kg)   BMI 29.86 kg/m    VITAL SIGNS:  reviewed  ASSESSMENT & PLAN:    1. Coronary disease status post inferior STEMI 2014 treated with DES to the RCA. Repeat cardiac catheterization in 2014 demonstrated continued patency. Moderate diffuse disease in the left circumflex. No ischemia by [facility] September 2017. He is minimally symptomatic with class 1 angina. We will continue aspirin and statin.   2. Diabetes mellitus: On metformin, glipizide, and insulin . Followed by primary care.  3. Dyslipidemia. On statin therapy. Excellent control. Scheduled for follow up labs with primary care in the spring.  4. Hypertension, BP  good control on current medications  5. Diabetic retinopathy and neuropathy. I suspect his balance issues are more related to his neuropathy.   COVID-19 Education: The signs and symptoms of COVID-19 were discussed with the patient and how to seek care for testing (follow up with PCP or arrange E-visit).  The importance of social distancing was discussed today.  Time:   Today, I have spent 10 minutes with the patient with telehealth technology discussing the above problems.     Medication Adjustments/Labs and Tests Ordered: Current medicines are reviewed at length with the patient today.  Concerns regarding medicines are outlined above.   Tests Ordered: No orders of the defined types were placed in this encounter.   Medication Changes: Meds ordered this encounter  Medications  . candesartan (ATACAND) 16 MG tablet    Sig: Take 1 tablet (16 mg total) by mouth daily.    Dispense:  90 tablet    Refill:  3    Follow Up:  In Person in 6 month(s)  Signed, Brenlyn Beshara Martinique, MD  03/24/2020 9:43 AM    [place]

## 2020-03-24 ENCOUNTER — Encounter: Payer: Self-pay | Admitting: Cardiology

## 2020-03-24 ENCOUNTER — Telehealth (INDEPENDENT_AMBULATORY_CARE_PROVIDER_SITE_OTHER): Payer: PPO | Admitting: Cardiology

## 2020-03-24 ENCOUNTER — Other Ambulatory Visit: Payer: Self-pay | Admitting: Family Medicine

## 2020-03-24 VITALS — Ht 66.0 in | Wt 185.0 lb

## 2020-03-24 DIAGNOSIS — E782 Mixed hyperlipidemia: Secondary | ICD-10-CM | POA: Diagnosis not present

## 2020-03-24 DIAGNOSIS — E0840 Diabetes mellitus due to underlying condition with diabetic neuropathy, unspecified: Secondary | ICD-10-CM

## 2020-03-24 DIAGNOSIS — I2510 Atherosclerotic heart disease of native coronary artery without angina pectoris: Secondary | ICD-10-CM | POA: Diagnosis not present

## 2020-03-24 DIAGNOSIS — I10 Essential (primary) hypertension: Secondary | ICD-10-CM

## 2020-03-24 DIAGNOSIS — Z794 Long term (current) use of insulin: Secondary | ICD-10-CM | POA: Diagnosis not present

## 2020-03-24 MED ORDER — CANDESARTAN CILEXETIL 16 MG PO TABS: 16.0000 mg | ORAL_TABLET | Freq: Every day | ORAL | 3 refills | Status: DC

## 2020-03-24 NOTE — Patient Instructions (Signed)
Medication Instructions:   Your physician has recommended you make the following change in your medication:   1) Decrease Candesartan to 16 mg, 1 tablet by mouth once a day  *If you need a refill on your cardiac medications before your next appointment, please call your pharmacy*  Lab Work:  None ordered today  If you have labs (blood work) drawn today and your tests are completely normal, you will receive your results only by: Marland Kitchen MyChart Message (if you have MyChart) OR . A paper copy in the mail If you have any lab test that is abnormal or we need to change your treatment, we will call you to review the results.  Testing/Procedures:  None ordered today  Follow-Up: At [facility], you and your health needs are our priority.  As part of our continuing mission to provide you with exceptional heart care, we have created designated Provider Care Teams.  These Care Teams include your primary Cardiologist (physician) and Advanced Practice Providers (APPs -  Physician Assistants and Nurse Practitioners) who all work together to provide you with the care you need, when you need it.  We recommend signing up for the patient portal called "MyChart".  Sign up information is provided on this After Visit Summary.  MyChart is used to connect with patients for Virtual Visits (Telemedicine).  Patients are able to view lab/test results, encounter notes, upcoming appointments, etc.  Non-urgent messages can be sent to your provider as well.   To learn more about what you can do with MyChart, go to [email or web address].    Your next appointment:   6 month(s)  The format for your next appointment:   In Person  Provider:   You may see Peter [place], MD or one of the following Advanced Practice Providers on your designated Care Team:    Azalee Course, PA-C  Micah Flesher, PA-C or   Judy Pimple, [place]

## 2020-03-25 ENCOUNTER — Other Ambulatory Visit: Payer: Self-pay | Admitting: Family Medicine

## 2020-03-29 ENCOUNTER — Other Ambulatory Visit: Payer: Self-pay | Admitting: Family Medicine

## 2020-03-30 ENCOUNTER — Other Ambulatory Visit: Payer: Self-pay | Admitting: Family Medicine

## 2020-04-19 DIAGNOSIS — H527 Unspecified disorder of refraction: Secondary | ICD-10-CM | POA: Diagnosis not present

## 2020-04-19 DIAGNOSIS — H40043 Steroid responder, bilateral: Secondary | ICD-10-CM | POA: Diagnosis not present

## 2020-04-19 DIAGNOSIS — Z961 Presence of intraocular lens: Secondary | ICD-10-CM | POA: Diagnosis not present

## 2020-04-19 DIAGNOSIS — H02403 Unspecified ptosis of bilateral eyelids: Secondary | ICD-10-CM | POA: Diagnosis not present

## 2020-04-19 DIAGNOSIS — E113521 Type 2 diabetes mellitus with proliferative diabetic retinopathy with traction retinal detachment involving the macula, right eye: Secondary | ICD-10-CM | POA: Diagnosis not present

## 2020-04-19 DIAGNOSIS — H26491 Other secondary cataract, right eye: Secondary | ICD-10-CM | POA: Diagnosis not present

## 2020-04-19 LAB — HM DIABETES EYE EXAM

## 2020-04-26 DIAGNOSIS — E113591 Type 2 diabetes mellitus with proliferative diabetic retinopathy without macular edema, right eye: Secondary | ICD-10-CM | POA: Diagnosis not present

## 2020-04-26 DIAGNOSIS — H3582 Retinal ischemia: Secondary | ICD-10-CM | POA: Diagnosis not present

## 2020-04-26 DIAGNOSIS — E113512 Type 2 diabetes mellitus with proliferative diabetic retinopathy with macular edema, left eye: Secondary | ICD-10-CM | POA: Diagnosis not present

## 2020-04-27 ENCOUNTER — Telehealth: Payer: Self-pay | Admitting: Family Medicine

## 2020-04-27 NOTE — Telephone Encounter (Signed)
I spoke to Dr. Allyne Gee ths morning. I would like for [name] to Come here tomorrow at #:10 PM

## 2020-04-27 NOTE — Telephone Encounter (Signed)
I called - pt and wife are aware of appt = referral can be made while here

## 2020-04-28 ENCOUNTER — Other Ambulatory Visit: Payer: Self-pay

## 2020-04-28 ENCOUNTER — Ambulatory Visit (INDEPENDENT_AMBULATORY_CARE_PROVIDER_SITE_OTHER): Payer: PPO | Admitting: Family Medicine

## 2020-04-28 ENCOUNTER — Encounter: Payer: Self-pay | Admitting: Family Medicine

## 2020-04-28 VITALS — BP 102/63 | HR 56 | Temp 97.5°F | Resp 20 | Ht 66.0 in | Wt 183.4 lb

## 2020-04-28 DIAGNOSIS — H47619 Cortical blindness, unspecified side of brain: Secondary | ICD-10-CM

## 2020-04-28 DIAGNOSIS — R29818 Other symptoms and signs involving the nervous system: Secondary | ICD-10-CM | POA: Diagnosis not present

## 2020-04-29 ENCOUNTER — Telehealth: Payer: Self-pay | Admitting: Family Medicine

## 2020-05-01 ENCOUNTER — Encounter: Payer: Self-pay | Admitting: Family Medicine

## 2020-05-01 NOTE — Progress Notes (Signed)
Subjective:  Patient ID: Ethan Peters, male    DOB: 06/17/1956  Age: 64 y.o. MRN: [id number]  CC: No chief complaint on file.   HPI Ethan Peters presents for increasing blindness over the last several weeks.  Ethan Peters is gone from near normal to virtually no vision.  Ethan Peters went to ophthalmology and was told that his eye exam is normal.  Ethan Peters is concerned that that is an accurate because in the past Ethan Peters has had bleeding in the back of his eye.  That was not found by the current ophthalmologist.  Ethan Peters is concerned that Ethan Peters might have missed something.  However, his ophthalmologist called yesterday and told me that Ethan Peters feels that there is a cortical blindness issue and that Ethan Peters needs a neurology referral and an MRI of the brain.  Ethan Peters is seen today to evaluate for that.  Patient has been a diabetic for several years.  Ethan Peters has had apparent retinopathy.  This does not seem to be the source based on ophthalmologic report.  Ethan Peters has had no excessive headaches or ataxia.  No homonymous hemianopsia.  Depression screen [facility] 2/9 04/28/2020 01/28/2020 11/20/2019  Decreased Interest 0 0 0  Down, Depressed, Hopeless 0 0 0  PHQ - 2 Score 0 0 0  Altered sleeping - - -  Tired, decreased energy - - -  Change in appetite - - -  Feeling bad or failure about yourself  - - -  Trouble concentrating - - -  Moving slowly or fidgety/restless - - -  Suicidal thoughts - - -  PHQ-9 Score - - -    History Ethan Peters has a past medical history of CAD (coronary artery disease) (01/09/13), Diabetes mellitus, Gout, Hypertension, Ischemic cardiomyopathy, Myocardial infarction ([place]) (01/09/2013), and Shortness of breath.   Ethan Peters has a past surgical history that includes Coronary angioplasty with stent (01/09/2013); Cardiac catheterization (01/22/2013); left heart catheterization with coronary angiogram (N/A, 01/09/2013); left heart catheterization with coronary angiogram (N/A, 01/22/2013); and Eye surgery (Bilateral).   His family  history includes Heart disease in his brother, brother, father, and mother.Ethan Peters reports that Ethan Peters has never smoked. Ethan Peters has never used smokeless tobacco. Ethan Peters reports current alcohol use. Ethan Peters reports that Ethan Peters does not use drugs.    ROS Review of Systems  Constitutional: Negative for fever.  Respiratory: Negative for shortness of breath.   Cardiovascular: Negative for chest pain.  Musculoskeletal: Negative for arthralgias.  Skin: Negative for rash.    Objective:  BP 102/63   Pulse (!) 56   Temp (!) 97.5 F (36.4 C) (Temporal)   Resp 20   Ht '5\' 6"'  (1.676 m)   Wt 183 lb 6 oz (83.2 kg)   SpO2 98%   BMI 29.60 kg/m   BP Readings from Last 3 Encounters:  04/28/20 102/63  01/28/20 116/71  09/21/19 116/82    Wt Readings from Last 3 Encounters:  04/28/20 183 lb 6 oz (83.2 kg)  03/24/20 185 lb (83.9 kg)  01/28/20 184 lb 2 oz (83.5 kg)     Physical Exam Vitals reviewed.  Constitutional:      Appearance: Ethan Peters is well-developed.  HENT:     Head: Normocephalic and atraumatic.     Right Ear: External ear normal.     Left Ear: External ear normal.     Mouth/Throat:     Pharynx: No oropharyngeal exudate or posterior oropharyngeal erythema.  Eyes:     Pupils: Pupils are equal, round, and reactive to  light.  Cardiovascular:     Rate and Rhythm: Normal rate and regular rhythm.     Heart sounds: No murmur heard.   Pulmonary:     Effort: No respiratory distress.     Breath sounds: Normal breath sounds.  Musculoskeletal:     Cervical back: Normal range of motion and neck supple.  Neurological:     Mental Status: Ethan Peters is alert and oriented to person, place, and time.       Assessment & Plan:   There are no diagnoses linked to this encounter.     I have discontinued Nivek E. Olaes's ergocalciferol. I am also having him maintain his aspirin, B-complex with vitamin C, Blood Glucose Monitor System, ibuprofen, allopurinol, insulin aspart protamine- aspart, nitroGLYCERIN, glipiZIDE,  candesartan, metFORMIN, atorvastatin, levothyroxine, OneTouch Verio, and cholecalciferol.  Allergies as of 04/28/2020      Reactions   Levemir [insulin Detemir] Swelling   Lisinopril Cough   Trazodone And Nefazodone    Penicillins Rash   Lip swelling      Medication List       Accurate as of April 28, 2020 11:59 PM. If you have any questions, ask your nurse or doctor.        STOP taking these medications   ergocalciferol 1.25 MG (50000 UT) capsule Commonly known as: VITAMIN D2 Stopped by: Claretta Fraise, MD     TAKE these medications   allopurinol 300 MG tablet Commonly known as: ZYLOPRIM TAKE ONE TABLET BY MOUTH ONCE DAILY AS NEEDED FOR GOUT prevention   aspirin 81 MG EC tablet Take 1 tablet (81 mg total) by mouth daily.   atorvastatin 80 MG tablet Commonly known as: LIPITOR TAKE 1 TABLET BY MOUTH ONCE DAILY AT 6 PM FOR CHOLESTEROL   B-complex with vitamin C tablet Take 1 tablet by mouth as needed (for vitamin deficiency).   Blood Glucose Monitor System w/Device Kit 1 Device by Does not apply route 2 (two) times daily.   candesartan 16 MG tablet Commonly known as: ATACAND Take 1 tablet (16 mg total) by mouth daily.   cholecalciferol 25 MCG (1000 UNIT) tablet Commonly known as: VITAMIN D3 Take 1,000 Units by mouth daily.   glipiZIDE 5 MG tablet Commonly known as: GLUCOTROL TAKE 1 TABLET (5 MG TOTAL) BY MOUTH DAILY BEFORE BREAKFAST.   ibuprofen 800 MG tablet Commonly known as: ADVIL Take 1 tablet (800 mg total) by mouth every 8 (eight) hours as needed for moderate pain.   insulin aspart protamine- aspart (70-30) 100 UNIT/ML injection Commonly known as: NovoLOG Mix 70/30 44 units before breakfast and 40 units before supper daily   levothyroxine 50 MCG tablet Commonly known as: SYNTHROID TAKE 1 TABLET BY MOUTH EVERY DAY   metFORMIN 1000 MG tablet Commonly known as: GLUCOPHAGE TAKE 1 TABLET (1,000 MG TOTAL) BY MOUTH 2 (TWO) TIMES DAILY WITH A MEAL.     nitroGLYCERIN 0.4 MG SL tablet Commonly known as: NITROSTAT Place 1 tablet (0.4 mg total) under the tongue every 5 (five) minutes x 3 doses as needed for chest pain.   OneTouch Verio test strip Generic drug: glucose blood USE 1 STRIP TO CHECK GLUCOSE 4 TIMES DAILY DX E13.42      The patient has applied for [place] citizenship.  Ethan Peters is being examined for that soon.  Unfortunately Ethan Peters cannot see to take the examination.  Ethan Peters needs a letter of accommodation.  Oral examination seems to be appropriate.  I have composed a letter to his immigration attorney,  Devon R. Senges, phone [phone], address [address] [place] [address].  Follow-up: No follow-ups on file.  Claretta Fraise, M.D.

## 2020-05-01 NOTE — Telephone Encounter (Signed)
Yes I was trying to find a neuro-ophthalmologist for him as well.  I thought I could submit both referrals at the same time but unfortunately this led to a delay.  I was also composing a letter for him to give to his lawyer.  Anyway my apologies for the delay.  I hope they will be understanding due to the circumstances.  Meanwhile I have finished the letter and submitted both referrals thanks, Broadus John.

## 2020-05-02 ENCOUNTER — Telehealth: Payer: Self-pay | Admitting: Family Medicine

## 2020-05-02 NOTE — Telephone Encounter (Signed)
lmtcb to schedule an appt with Dr. Louanne Skye

## 2020-05-02 NOTE — Telephone Encounter (Signed)
Sure

## 2020-05-02 NOTE — Telephone Encounter (Signed)
I am okay with that if Dr. Darlyn Read is okay with that

## 2020-05-02 NOTE — Telephone Encounter (Signed)
Wife is requesting the MRI order to be ordered at [facility] instead of [facility]. Also requesting an appt to be scheduled asap.

## 2020-05-03 ENCOUNTER — Other Ambulatory Visit: Payer: PPO

## 2020-05-03 ENCOUNTER — Other Ambulatory Visit: Payer: Self-pay

## 2020-05-03 DIAGNOSIS — E1159 Type 2 diabetes mellitus with other circulatory complications: Secondary | ICD-10-CM

## 2020-05-03 NOTE — Telephone Encounter (Signed)
Appt changed to [facility] - patient aware

## 2020-05-03 NOTE — Progress Notes (Signed)
Order placed for lab needed before MRI

## 2020-05-04 ENCOUNTER — Telehealth: Payer: Self-pay | Admitting: Cardiology

## 2020-05-04 LAB — COMPREHENSIVE METABOLIC PANEL
ALT: 13 IU/L (ref 0–44)
AST: 14 IU/L (ref 0–40)
Albumin/Globulin Ratio: 1.7 (ref 1.2–2.2)
Albumin: 4.7 g/dL (ref 3.8–4.8)
Alkaline Phosphatase: 71 IU/L (ref 48–121)
BUN/Creatinine Ratio: 21 (ref 10–24)
BUN: 24 mg/dL (ref 8–27)
Bilirubin Total: 0.5 mg/dL (ref 0.0–1.2)
CO2: 19 mmol/L — ABNORMAL LOW (ref 20–29)
Calcium: 9.9 mg/dL (ref 8.6–10.2)
Chloride: 103 mmol/L (ref 96–106)
Creatinine, Ser: 1.16 mg/dL (ref 0.76–1.27)
GFR calc Af Amer: 77 mL/min/{1.73_m2} (ref >59)
GFR calc non Af Amer: 66 mL/min/{1.73_m2} (ref >59)
Globulin, Total: 2.7 g/dL (ref 1.5–4.5)
Glucose: 145 mg/dL — ABNORMAL HIGH (ref 65–99)
Potassium: 4.9 mmol/L (ref 3.5–5.2)
Sodium: 138 mmol/L (ref 134–144)
Total Protein: 7.4 g/dL (ref 6.0–8.5)

## 2020-05-04 NOTE — Telephone Encounter (Signed)
Called and spoke to son and patient- advised that this would be okay for him- as they all see his heart condition before ordering test.  Patient is demanding it be okay from Dr.Jordan. Advised I would route to him to advise.  Patient son verbalized understanding.

## 2020-05-04 NOTE — Telephone Encounter (Signed)
Patient's son states he is calling to inquire about whether or not the dye used during MRI scheduled for 05/14/20 will have a negative impact due to his heart condition. Please return call to discuss.

## 2020-05-05 NOTE — Telephone Encounter (Signed)
Spoke with pt son, aware of dr Elvis Coil recommendations.

## 2020-05-05 NOTE — Telephone Encounter (Signed)
The contrast used for MRI will not affect his heart. He is okay to have this done  Ethan Peters [place] MD, [facility]

## 2020-05-09 ENCOUNTER — Ambulatory Visit (HOSPITAL_COMMUNITY): Payer: PPO

## 2020-05-14 ENCOUNTER — Ambulatory Visit (HOSPITAL_COMMUNITY)
Admission: RE | Admit: 2020-05-14 | Discharge: 2020-05-14 | Disposition: A | Discharge status: Home / Self Care | Payer: PPO | Source: Ambulatory Visit | Attending: Family Medicine | Admitting: Family Medicine

## 2020-05-14 ENCOUNTER — Other Ambulatory Visit: Payer: Self-pay

## 2020-05-14 DIAGNOSIS — R29818 Other symptoms and signs involving the nervous system: Secondary | ICD-10-CM | POA: Insufficient documentation

## 2020-05-14 DIAGNOSIS — H47619 Cortical blindness, unspecified side of brain: Secondary | ICD-10-CM | POA: Diagnosis not present

## 2020-05-14 MED ORDER — GADOBUTROL 1 MMOL/ML IV SOLN
7.5000 mL | Freq: Once | INTRAVENOUS | Status: AC | PRN
  Administered 2020-05-14: 7.5 mL via INTRAVENOUS

## 2020-05-16 ENCOUNTER — Emergency Department (HOSPITAL_COMMUNITY)
Admission: EM | Admit: 2020-05-16 | Discharge: 2020-05-16 | Disposition: A | Discharge status: Home / Self Care | Payer: PPO | Attending: Emergency Medicine | Admitting: Emergency Medicine

## 2020-05-16 ENCOUNTER — Emergency Department (HOSPITAL_COMMUNITY): Payer: PPO

## 2020-05-16 ENCOUNTER — Encounter (HOSPITAL_COMMUNITY): Payer: Self-pay | Admitting: Emergency Medicine

## 2020-05-16 ENCOUNTER — Other Ambulatory Visit: Payer: Self-pay

## 2020-05-16 ENCOUNTER — Telehealth: Payer: Self-pay | Admitting: *Deleted

## 2020-05-16 DIAGNOSIS — R05 Cough: Secondary | ICD-10-CM | POA: Diagnosis not present

## 2020-05-16 DIAGNOSIS — Z20822 Contact with and (suspected) exposure to covid-19: Secondary | ICD-10-CM | POA: Diagnosis not present

## 2020-05-16 DIAGNOSIS — I509 Heart failure, unspecified: Secondary | ICD-10-CM | POA: Diagnosis not present

## 2020-05-16 DIAGNOSIS — I110 Hypertensive heart disease with heart failure: Secondary | ICD-10-CM | POA: Insufficient documentation

## 2020-05-16 DIAGNOSIS — I501 Left ventricular failure: Secondary | ICD-10-CM | POA: Diagnosis not present

## 2020-05-16 DIAGNOSIS — R509 Fever, unspecified: Secondary | ICD-10-CM | POA: Diagnosis not present

## 2020-05-16 DIAGNOSIS — H538 Other visual disturbances: Secondary | ICD-10-CM | POA: Diagnosis present

## 2020-05-16 DIAGNOSIS — J189 Pneumonia, unspecified organism: Secondary | ICD-10-CM | POA: Diagnosis not present

## 2020-05-16 DIAGNOSIS — I2510 Atherosclerotic heart disease of native coronary artery without angina pectoris: Secondary | ICD-10-CM | POA: Diagnosis not present

## 2020-05-16 DIAGNOSIS — H547 Unspecified visual loss: Secondary | ICD-10-CM | POA: Insufficient documentation

## 2020-05-16 DIAGNOSIS — J811 Chronic pulmonary edema: Secondary | ICD-10-CM | POA: Diagnosis not present

## 2020-05-16 DIAGNOSIS — G9389 Other specified disorders of brain: Secondary | ICD-10-CM | POA: Insufficient documentation

## 2020-05-16 DIAGNOSIS — E119 Type 2 diabetes mellitus without complications: Secondary | ICD-10-CM | POA: Insufficient documentation

## 2020-05-16 DIAGNOSIS — R059 Cough, unspecified: Secondary | ICD-10-CM

## 2020-05-16 DIAGNOSIS — J9811 Atelectasis: Secondary | ICD-10-CM | POA: Diagnosis not present

## 2020-05-16 LAB — BASIC METABOLIC PANEL
Anion gap: 12 (ref 5–15)
BUN: 11 mg/dL (ref 8–23)
CO2: 23 mmol/L (ref 22–32)
Calcium: 9 mg/dL (ref 8.9–10.3)
Chloride: 101 mmol/L (ref 98–111)
Creatinine, Ser: 1.19 mg/dL (ref 0.61–1.24)
GFR calc Af Amer: >60 mL/min (ref >60)
GFR calc non Af Amer: >60 mL/min (ref >60)
Glucose, Bld: 208 mg/dL — ABNORMAL HIGH (ref 70–99)
Potassium: 3.9 mmol/L (ref 3.5–5.1)
Sodium: 136 mmol/L (ref 135–145)

## 2020-05-16 LAB — CBC
HCT: 40.3 % (ref 39.0–52.0)
Hemoglobin: 13.4 g/dL (ref 13.0–17.0)
MCH: 31.2 pg (ref 26.0–34.0)
MCHC: 33.3 g/dL (ref 30.0–36.0)
MCV: 93.7 fL (ref 80.0–100.0)
Platelets: 180 10*3/uL (ref 150–400)
RBC: 4.3 MIL/uL (ref 4.22–5.81)
RDW: 12.4 % (ref 11.5–15.5)
WBC: 11 10*3/uL — ABNORMAL HIGH (ref 4.0–10.5)
nRBC: 0 % (ref 0.0–0.2)

## 2020-05-16 LAB — SARS CORONAVIRUS 2 BY RT PCR (HOSPITAL ORDER, PERFORMED IN ~~LOC~~ HOSPITAL LAB): SARS Coronavirus 2: NEGATIVE

## 2020-05-16 MED ORDER — SODIUM CHLORIDE 0.9% FLUSH: 3.0000 mL | Freq: Once | INTRAVENOUS | Status: DC

## 2020-05-16 MED ORDER — FUROSEMIDE 10 MG/ML IJ SOLN
20.0000 mg | Freq: Once | INTRAMUSCULAR | Status: AC
  Administered 2020-05-16: 20 mg via INTRAVENOUS
  Filled 2020-05-16: qty 2

## 2020-05-16 MED ORDER — DOXYCYCLINE HYCLATE 100 MG PO CAPS
100.0000 mg | ORAL_CAPSULE | Freq: Two times a day (BID) | ORAL | 0 refills | Status: AC
Start: 2020-05-16 — End: 2020-05-26

## 2020-05-16 MED ORDER — SODIUM CHLORIDE 0.9 % IV SOLN
100.0000 mg | Freq: Once | INTRAVENOUS | Status: AC
  Administered 2020-05-16: 100 mg via INTRAVENOUS
  Filled 2020-05-16: qty 100

## 2020-05-16 NOTE — ED Notes (Signed)
Pt placed on 2L [place]. O2 saturation is at 94%.

## 2020-05-16 NOTE — Discharge Instructions (Addendum)
You were evaluated in the Emergency Department and after careful evaluation, we did not find any emergent condition requiring admission or further testing in the hospital.  Your exam/testing today is overall reassuring.  Please take the doxycycline antibiotic to treat for pneumonia.  It is also important that you follow-up with your primary care doctor to discuss the fluid in your lungs.  You may need to be started on Lasix.  We also recommend that you follow-up with Dr. Franky Macho of neurosurgery for further management of your vision loss.  Please return to the Emergency Department if you experience any worsening of your condition.   Thank you for allowing [place] to be a part of your care.

## 2020-05-16 NOTE — Telephone Encounter (Signed)
Wife came in to the office stating that patient received a call from Dr. Darlyn Read stating that he has a hematoma.  Wife also states that patient was advised to go to the ED to be evaluated but was concerned on going to the ED due to cough and fever.  Advised patient that he could go to the ED with cough and fever.  Wife verbalized understanding and states that she will take him to [facility].

## 2020-05-16 NOTE — ED Provider Notes (Addendum)
[facility]EMER[facility]ENCY DEPT [facility] Emergency Department Provider Note MRN:  [id number]  Arrival date & time: 05/16/20     Chief Complaint   Vision problem and Abnormal MRI   History of Present Illness   Ethan Peters is a 64 y.o. year-old male with a history of CAD, CHF, diabetes presenting to the ED with chief complaint of vision problem.  Patient began experiencing pressure behind the eyes in February.  Was evaluated by an eye doctor and told that his eyes were fine.  Pressure behind the eyes continued to worsen and 2 months ago began experiencing vision loss that slowly worsened.  Now he has severe vision loss and near blindness.  He was able to undergo MRI imaging a few days ago, which revealed subdural fluid collection.  Here for specialty consultation.  Endorses generalized weakness, also with recent fever and cough for the past 3 to 4 days.  Denies neck pain, no back pain, no chest pain, no shortness of breath.  Vaccinated for Covid.  Review of Systems  A complete 10 system review of systems was obtained and all systems are negative except as noted in the HPI and PMH.   Patient's Health History    Past Medical History:  Diagnosis Date  . CAD (coronary artery disease) 01/09/13   Inferior STEMI s/p DES-RCA  . Diabetes mellitus    Uncontrolled, Hgb A1C 14.8% on 03/14, started on insulin  . Gout   . Hypertension   . Ischemic cardiomyopathy    EF 45-50%, grade 1 diastolic dysfunction, mildly dilated RV, RA at the upper limits of normal, mild TR, PA systolic pressure 32 mm mercury and hypokinesis to akinesis of the basal mid inferior myocardium  . Myocardial infarction ([facility]) 01/09/2013  . Shortness of breath     Past Surgical History:  Procedure Laterality Date  . CARDIAC CATHETERIZATION  01/22/2013   Diffuse borderline residual CAD consistent with uncontrolled diabetes, medical management recommended  . CORONARY ANGIOPLASTY WITH STENT PLACEMENT  01/09/2013   30% pLAD, 30%  mLAD, 70% pLCx, RCA occlusion at crux with R->L distal collaterals s/p DES; LVEF 50%, moderate-severe HK of inferior basal wall  . EYE SURGERY Bilateral   . LEFT HEART CATHETERIZATION WITH CORONARY ANGIOGRAM N/A 01/09/2013   Procedure: LEFT HEART CATHETERIZATION WITH CORONARY ANGIOGRAM;  Surgeon: Peter M Swaziland, MD;  Location: [facility] CATH LAB;  Service: Cardiovascular;  Laterality: N/A;  . LEFT HEART CATHETERIZATION WITH CORONARY ANGIOGRAM N/A 01/22/2013   Procedure: LEFT HEART CATHETERIZATION WITH CORONARY ANGIOGRAM;  Surgeon: Tonny Bollman, MD;  Location: [facility] CATH LAB;  Service: Cardiovascular;  Laterality: N/A;    Family History  Problem Relation Age of Onset  . Heart disease Mother   . Heart disease Father   . Heart disease Brother   . Heart disease Brother     Social History   Socioeconomic History  . Marital status: Married    Spouse name: Wendall Papa  . Number of children: 16  . Years of education: 77  . Highest education level: Associate degree: occupational, Scientist, product/process development, or vocational program  Occupational History  . Occupation: disabled    Comment: Chartered certified accountant   Tobacco Use  . Smoking status: Never Smoker  . Smokeless tobacco: Never Used  Vaping Use  . Vaping Use: Never used  Substance and Sexual Activity  . Alcohol use: Yes    Comment: 4 times per year-wine or beer  . Drug use: No  . Sexual activity: Yes    Birth control/protection: None  Other Topics Concern  . Not on file  Social History Narrative   Spanish-speaking. Limited English.    Social Determinants of Health   Financial Resource Strain: Low Risk   . Difficulty of Paying Living Expenses: Not very hard  Food Insecurity: No Food Insecurity  . Worried About Programme researcher, broadcasting/film/video in the Last Year: Never true  . Ran Out of Food in the Last Year: Never true  Transportation Needs: No Transportation Needs  . Lack of Transportation (Medical): No  . Lack of Transportation (Non-Medical): No  Physical Activity:  Sufficiently Active  . Days of Exercise per Week: 7 days  . Minutes of Exercise per Session: 60 min  Stress: No Stress Concern Present  . Feeling of Stress : Not at all  Social Connections: Moderately Isolated  . Frequency of Communication with Friends and Family: More than three times a week  . Frequency of Social Gatherings with Friends and Family: More than three times a week  . Attends Religious Services: Never  . Active Member of Clubs or Organizations: No  . Attends Banker Meetings: Never  . Marital Status: Married  Catering manager Violence: Not At Risk  . Fear of Current or Ex-Partner: No  . Emotionally Abused: No  . Physically Abused: No  . Sexually Abused: No     Physical Exam   Vitals:   05/16/20 1043 05/16/20 1329  BP: 125/85 139/84  Pulse: 91 85  Resp: 16 18  Temp: 99.3 F (37.4 C)   SpO2: 92% (!) 88%    CONSTITUTIONAL: Well-appearing, NAD NEURO:  Alert and oriented x 3, no focal deficits EYES: Left pupil 3 mm, right pupil 4 mm, extraocular movements are intact, severely impaired visual acuity bilaterally, cannot count fingers, inconsistently sees motion ENT/NECK:  no LAD, no JVD CARDIO: Regular rate, well-perfused, normal S1 and S2 PULM:  CTAB no wheezing or rhonchi GI/GU:  normal bowel sounds, non-distended, non-tender MSK/SPINE:  No gross deformities, no edema SKIN:  no rash, atraumatic PSYCH:  Appropriate speech and behavior  *Additional and/or pertinent findings included in MDM below  Diagnostic and Interventional Summary    EKG Interpretation  Date/Time:    Ventricular Rate:    PR Interval:    QRS Duration:   QT Interval:    QTC Calculation:   R Axis:     Text Interpretation:        Labs Reviewed  BASIC METABOLIC PANEL - Abnormal; Notable for the following components:      Result Value   Glucose, Bld 208 (*)    All other components within normal limits  CBC - Abnormal; Notable for the following components:   WBC 11.0 (*)     All other components within normal limits  SARS CORONAVIRUS 2 BY RT PCR (HOSPITAL ORDER, PERFORMED IN [facility])  BRAIN NATRIURETIC PEPTIDE    DG Chest Port 1 View  Final Result      Medications  sodium chloride flush (NS) 0.9 % injection 3 mL (has no administration in time range)  furosemide (LASIX) injection 20 mg (has no administration in time range)  doxycycline (VIBRAMYCIN) 100 mg in sodium chloride 0.9 % 250 mL IVPB (has no administration in time range)     Procedures  /  Critical Care Procedures  ED Course and Medical Decision Making  I have reviewed the triage vital signs, the nursing notes, and pertinent available records from the EMR.  Listed above are laboratory and imaging tests  that I personally ordered, reviewed, and interpreted and then considered in my medical decision making (see below for details).      Blindness with subdural fluid collection, will consult neurosurgery.  Also with 3 days of cough and subjective fever, will obtain chest x-ray.  No increased work of breathing, triage oxygen saturation 88% but on my exam he is 93 to 94% on room air and appears comfortable, will ambulate with pulse ox and swab for Covid.  Signed out to oncoming provider shift.  3:41 PM update: I was personally present during ambulation with pulse ox with the patient, and he did well, minimally symptomatic, oxygen saturation not dropping below 94%.  Chest x-ray does demonstrate evidence of pulmonary edema, and I suspect this is part of the issue but patient also experienced fever up to 101.4 earlier today and with cough there is clinical suspicion for pneumonia as well.  Providing IV Lasix and advised close PCP follow-up to discuss medication adjustment and addition of Lasix.  We will also provide antibiotics for CAP coverage.  Patient requesting injection of first dose of antibiotic, will provide first dose doxycycline IV.  Dr. Franky Macho was reached by phone and he agrees to  evaluate the patient but feels that this is likely not going to require admission or emergent surgery, will see close and follow-up.  Patient and patient's wife are happy to be discharged home and agree with outpatient plan.  All discussions done with video interpretation in Spanish.  Signed out to oncoming provider at shift change.  Elmer Sow. Pilar Plate, MD [facility] Emergency Medicine [facility] [email or web address]  Final Clinical Impressions(s) / ED Diagnoses     ICD-10-CM   1. Vision loss  H54.7   2. Subdural fluid collection  G93.89   3. Cough  R05   4. Community acquired pneumonia, unspecified laterality  J18.9   5. Pulmonary edema with congestive heart failure (HCC)  I50.1     ED Discharge Orders         Ordered    doxycycline (VIBRAMYCIN) 100 MG capsule  2 times daily     Discontinue  Reprint     05/16/20 1544           Discharge Instructions Discussed with and Provided to Patient:     Discharge Instructions     You were evaluated in the Emergency Department and after careful evaluation, we did not find any emergent condition requiring admission or further testing in the hospital.  Your exam/testing today is overall reassuring.  Please take the doxycycline antibiotic to treat for pneumonia.  It is also important that you follow-up with your primary care doctor to discuss the fluid in your lungs.  You may need to be started on Lasix.  We also recommend that you follow-up with Dr. Franky Macho of neurosurgery for further management of your vision loss.  Please return to the Emergency Department if you experience any worsening of your condition.   Thank you for allowing [place] to be a part of your care.       Sabas Sous, MD 05/16/20 1459    Sabas Sous, MD 05/16/20 [phone]

## 2020-05-16 NOTE — Progress Notes (Signed)
Patient ID: Ethan Peters, male   DOB: 1956-09-30, 65 y.o.   MRN: [id number] Mri reviewed. This is a chronic subdural, it is not causing significant mass effect. He certainly does not need emergent operative evacuation of the blood. I will follow up as an outpatient.

## 2020-05-16 NOTE — ED Provider Notes (Signed)
Pt signed out by Dr. Pilar Plate pending Dr. Sueanne Margarita eval.  SDH is chronic, so does not need emergent surgery.  Pt's oxygenation is good.  He is stable for d/c.  Return if worse.   Jacalyn Lefevre, MD 05/16/20 Barry Brunner

## 2020-05-16 NOTE — ED Notes (Signed)
Spoke to [place] - CN about a room for the pt.

## 2020-05-16 NOTE — ED Triage Notes (Signed)
Patient arrives to ED with complaints of abnormal MRI scan. Per patient and family, patients vision has slowly worsened over the past 3 plus months. Patient had an MRI brain yesterday which showed chronic fluid subdural collection. Pt here for neuro consult. No pain , NAD in triage.

## 2020-05-18 ENCOUNTER — Telehealth: Payer: Self-pay | Admitting: Family Medicine

## 2020-05-18 NOTE — Telephone Encounter (Signed)
Pt's son says pt is still SOB, congested and coughing a lot and the pt wants to come in for an antibiotic injection. Advised we could schedule him an appt but I can't guarantee a specific treatment as that would be up to the provider. Pt scheduled with Lendon Colonel 05/20/20 at 3:55.

## 2020-05-20 ENCOUNTER — Encounter: Payer: Self-pay | Admitting: Family

## 2020-05-20 ENCOUNTER — Ambulatory Visit (INDEPENDENT_AMBULATORY_CARE_PROVIDER_SITE_OTHER): Payer: PPO | Admitting: Family

## 2020-05-20 ENCOUNTER — Other Ambulatory Visit: Payer: Self-pay

## 2020-05-20 ENCOUNTER — Ambulatory Visit (INDEPENDENT_AMBULATORY_CARE_PROVIDER_SITE_OTHER): Payer: PPO

## 2020-05-20 VITALS — BP 141/85 | HR 64 | Temp 97.3°F | Ht 66.0 in

## 2020-05-20 DIAGNOSIS — J810 Acute pulmonary edema: Secondary | ICD-10-CM | POA: Diagnosis not present

## 2020-05-20 DIAGNOSIS — R0602 Shortness of breath: Secondary | ICD-10-CM

## 2020-05-20 DIAGNOSIS — Z09 Encounter for follow-up examination after completed treatment for conditions other than malignant neoplasm: Secondary | ICD-10-CM

## 2020-05-20 DIAGNOSIS — J189 Pneumonia, unspecified organism: Secondary | ICD-10-CM | POA: Diagnosis not present

## 2020-05-20 MED ORDER — FUROSEMIDE 20 MG PO TABS: 20.0000 mg | ORAL_TABLET | Freq: Every day | ORAL | 0 refills | Status: DC

## 2020-05-20 NOTE — Patient Instructions (Signed)
Pulmonary Edema Pulmonary edema is a condition in which fluid collects in the air sacs of the lung. This makes it hard for the lungs to fill with air. It also prevents the lungs from moving oxygen into the bloodstream, which can affect other organs, such as the brain and kidneys. Pulmonary edema is an emergency and should be treated immediately. There are two main types of pulmonary edema:  Cardiogenic. This means the pulmonary edema was caused by a problem with the heart.  Noncardiogenic. This means the pulmonary edema was caused by something other than the heart, such as an injury to the lung. What are the causes? This condition is commonly caused by heart failure. When this happens, the heart is not able to properly pump blood through the body. This can lead to increased pressure in the heart and blood building up in the veins around the lungs. When blood builds up in these veins, fluid gets pushed into the air sacs of the lung. Heart failure may be caused by:  Coronary artery disease.  High blood pressure.  Viral infection of the heart (myocarditis).  Leaky or stiff heart valves.  Irregular heartbeat (arrhythmia).  Fluid buildup caused by kidney problems. Other causes include:  Infection in the lungs (pneumonia), blood (sepsis), or other part of the body.  Severe injury to the chest.  Lung injury from heat or toxins, such as breathing in smoke or poisonous gas.  Inhaling vomit or water (pulmonaryaspiration).  Certain medicines.  High altitude. What are the signs or symptoms? Symptoms of this condition include:  Shortness of breath.  Coughing with frothy or bloody mucus.  Wheezing.  Feeling like you cannot get enough air.  Shallow and fast breathing.  Skin that is cool and damp, and has a pale or bluish color. How is this diagnosed? This condition is diagnosed based on:  Your medical history.  A physical exam.  Your symptoms. You may also have other tests,  including:  Chest X-ray.  Chest CT scan.  Blood tests, including checking the amount of oxygen in the blood.  Sputum culture. This test checks for infection in the mucus that you cough up from your lungs.  Electrocardiogram. This measures the electrical signals of the heart.  Echocardiogram. This uses an ultrasound to evaluate the health of the heart. How is this treated? Initial treatment for this condition focuses on relieving your symptoms. Treatment depends on the underlying cause of the condition. This may include:  Oxygen therapy. The oxygen may be given through tubes in your nose or through a face mask. In severe cases, a breathing tube is inserted into the windpipe and hooked up to a breathing machine (ventilator).  Medicines. These may include medicines to: ? Help the body get rid of extra water (diuretics). ? Help the heart pump blood properly. ? Prevent or destroy blood clots. If poor heart function is the cause, treatment may also include:  Procedures to open blocked arteries, repair damaged heart valves, or remove some of the damaged heart muscle.  A pacemaker to help with heart function.  A procedure that uses electric shocks to regulate heart rate (cardioversion). If an infection is the cause, treatment may include antibiotic medicines. Follow these instructions at home: Medicines  Take over-the-counter and prescription medicines only as told by your health care provider.  If you were prescribed an antibiotic, take it as told by your health care provider. Do not stop taking the antibiotic even if you start to feel better.    Have a plan with information about each medicine you take. This should include: ? Why you take the medicine. ? Possible side effects. ? Best time of day to take it. ? Foods to take with it, or foods to avoid when taking it. ? When to call your health care provider.  Make a list of each medicine, vitamin, or herbal supplement you take. Keep  the list with you at all times. Show it to your health care provider at each visit and before starting a new medicine. Update the list as you add or stop medicines. Lifestyle   Exercise regularly as told by your health care provider. It is important to do it safely. You can do this by: ? Pacing your activities to avoid shortness of breath or chest pain. ? Resting for at least 1 hour before and after meals. ? Asking about cardiac rehabilitation programs. These may include education, exercise plans, and counseling.  Eat a heart-healthy diet that is low in salt (sodium), saturated fat, and cholesterol. Your health care provider may recommend foods that are high in fiber, such as fresh fruits and vegetables, whole grains, and beans.  Do not use any products that contain nicotine or tobacco, such as cigarettes and e-cigarettes. If you need help quitting, ask your health care provider. General instructions  Maintain a healthy weight.  Keep a record of your weight: ? Record your hospital or clinic weight. When you get home, compare it to your scale and record your weight. ? Weigh yourself first thing each morning after you urinate and before you eat breakfast. Wear the same amount of clothing each time. Record the weights. ? Share your weight record with your health care provider. Daily weights are important in detecting the body's retention of excess fluid. ? Tell your health care provider right away if you gain weight quickly. Your medicines may need to be adjusted.  Check and record your blood pressure as often as told by your health care provider. Bring the records with you to clinic visits.  Consider therapy or joining a support group. This may help with any stress, fear, or anxiety.  Keep all follow-up visits as told by your health care provider. This is important. Get help right away if:  You gain weight quickly.  You have severe chest pain, especially if the pain is crushing or  pressure-like and spreads to the arms, back, neck, or jaw.  You have more swelling in your hands, feet, ankles, or abdomen.  You have nausea.  You have unusual sweating or your skin turns blue or pale.  Your shortness of breath gets worse.  You have dizziness, blurred vision, a headache, or unsteadiness.  Your blood pressure is higher than 180/120.  You cough up bloody mucus (sputum).  You cannot sleep because it is hard to breathe.  You feel a racing heart beat (palpitations).  You have anxiety or a feeling that you cannot get enough air. These symptoms may represent a serious problem that is an emergency. Do not wait to see if the symptoms will go away. Get medical help right away. Call your local emergency services (911 in the [place] Do not drive yourself to the hospital. Summary  Pulmonary edema is a condition in which fluid collects in the air sacs of your lungs. If left untreated, it can lead to a medical emergency.  This condition is most commonly caused by heart failure. Other causes can include infections or injury to the lungs.  Take over-the-counter   and prescription medicines only as told by your health care provider. This information is not intended to replace advice given to you by your health care provider. Make sure you discuss any questions you have with your health care provider. Document Revised: 09/13/2017 Document Reviewed: 12/12/2016 Elsevier Patient Education  2020 [employer]vier Inc[employer]  

## 2020-05-20 NOTE — Progress Notes (Signed)
Subjective:    Patient ID: Ethan Peters, male    DOB: 03-08-1956, 64 y.o.   MRN: [id number]  Chief Complaint  Patient presents with  . Cough  . Shortness of Breath   Pt presents to the office today with SOB. He was recently went to ED on 05/16/20 with fever, SOB, and vision loss. He had a negative COVID test and a chest x-ray that showed pulmonary edema and suspicion for pneumonia. He was discharged on doxycyline 100 mg BID. He was also given IV lasix.   He reports he is feeling slightly better, but continues to have SOB. States his SOB is worse when laying down.  Cough This is a new problem. The current episode started 1 to 4 weeks ago. The problem has been gradually worsening. The cough is productive of purulent sputum. Associated symptoms include chills, headaches, myalgias, postnasal drip, shortness of breath and wheezing. Pertinent negatives include no ear pain or sore throat. The symptoms are aggravated by lying down. He has tried OTC inhaler (doxycyline) for the symptoms.  Shortness of Breath Associated symptoms include headaches and wheezing. Pertinent negatives include no ear pain or sore throat.      Review of Systems  Constitutional: Positive for chills.  HENT: Positive for postnasal drip. Negative for ear pain and sore throat.   Respiratory: Positive for cough, shortness of breath and wheezing.   Musculoskeletal: Positive for myalgias.  Neurological: Positive for headaches.  All other systems reviewed and are negative.      Objective:   Physical Exam Vitals reviewed.  Constitutional:      General: He is not in acute distress.    Appearance: He is well-developed.  HENT:     Head: Normocephalic.     Right Ear: Tympanic membrane normal.     Left Ear: Tympanic membrane normal.  Eyes:     General:        Right eye: No discharge.        Left eye: No discharge.     Pupils: Pupils are equal, round, and reactive to light.     Comments: Blind  Neck:      Thyroid: No thyromegaly.  Cardiovascular:     Rate and Rhythm: Normal rate and regular rhythm.     Heart sounds: Normal heart sounds. No murmur heard.   Pulmonary:     Effort: Pulmonary effort is normal. No respiratory distress.     Breath sounds: Rhonchi and rales present. No wheezing.  Abdominal:     General: Bowel sounds are normal. There is no distension.     Palpations: Abdomen is soft.     Tenderness: There is no abdominal tenderness.  Musculoskeletal:        General: No tenderness. Normal range of motion.     Cervical back: Normal range of motion and neck supple.     Comments: Generalized weakness  Skin:    General: Skin is warm and dry.     Findings: No erythema or rash.  Neurological:     Mental Status: He is alert and oriented to person, place, and time.     Cranial Nerves: No cranial nerve deficit.     Deep Tendon Reflexes: Reflexes are normal and symmetric.  Psychiatric:        Behavior: Behavior normal.        Thought Content: Thought content normal.        Judgment: Judgment normal.      BP (!) 141/85  Pulse 64   Temp (!) 97.3 F (36.3 C) (Temporal)   Ht '5\' 6"'  (1.676 m)   BMI 29.60 kg/m       Assessment & Plan:  Ethan Peters comes in today with chief complaint of Cough and Shortness of Breath   Diagnosis and orders addressed:  1. Hospital discharge follow-up - CBC with Differential/Platelet - BMP8+EGFR - DG Chest 2 View; Future  2. Community acquired pneumonia, unspecified laterality - CBC with Differential/Platelet - BMP8+EGFR - DG Chest 2 View; Future  3. Acute pulmonary edema (HCC) - CBC with Differential/Platelet - BMP8+EGFR - DG Chest 2 View; Future - furosemide (LASIX) 20 MG tablet; Take 1 tablet (20 mg total) by mouth daily.  Dispense: 30 tablet; Refill: 0  Continue Doxycyline Will give Lasix 20 mg daily for next 7 days If SOB worsens go to ED Labs pending   Follow up plan: 1 week with PCP to follow up with starting lasix    Evelina Dun, FNP

## 2020-05-21 LAB — CBC WITH DIFFERENTIAL/PLATELET
Basophils Absolute: 0.1 10*3/uL (ref 0.0–0.2)
Basos: 1 %
EOS (ABSOLUTE): 0.1 10*3/uL (ref 0.0–0.4)
Eos: 1 %
Hematocrit: 37.8 % (ref 37.5–51.0)
Hemoglobin: 13.1 g/dL (ref 13.0–17.7)
Immature Grans (Abs): 0 10*3/uL (ref 0.0–0.1)
Immature Granulocytes: 1 %
Lymphocytes Absolute: 2 10*3/uL (ref 0.7–3.1)
Lymphs: 35 %
MCH: 32.1 pg (ref 26.6–33.0)
MCHC: 34.7 g/dL (ref 31.5–35.7)
MCV: 93 fL (ref 79–97)
Monocytes Absolute: 0.4 10*3/uL (ref 0.1–0.9)
Monocytes: 6 %
Neutrophils Absolute: 3.2 10*3/uL (ref 1.4–7.0)
Neutrophils: 56 %
Platelets: 217 10*3/uL (ref 150–450)
RBC: 4.08 x10E6/uL — ABNORMAL LOW (ref 4.14–5.80)
RDW: 12.6 % (ref 11.6–15.4)
WBC: 5.7 10*3/uL (ref 3.4–10.8)

## 2020-05-21 LAB — BMP8+EGFR
BUN/Creatinine Ratio: 11 (ref 10–24)
BUN: 12 mg/dL (ref 8–27)
CO2: 24 mmol/L (ref 20–29)
Calcium: 9.6 mg/dL (ref 8.6–10.2)
Chloride: 103 mmol/L (ref 96–106)
Creatinine, Ser: 1.06 mg/dL (ref 0.76–1.27)
GFR calc Af Amer: 85 mL/min/{1.73_m2} (ref >59)
GFR calc non Af Amer: 74 mL/min/{1.73_m2} (ref >59)
Glucose: 214 mg/dL — ABNORMAL HIGH (ref 65–99)
Potassium: 4.6 mmol/L (ref 3.5–5.2)
Sodium: 139 mmol/L (ref 134–144)

## 2020-05-23 DIAGNOSIS — I6203 Nontraumatic chronic subdural hemorrhage: Secondary | ICD-10-CM | POA: Diagnosis not present

## 2020-05-23 DIAGNOSIS — H547 Unspecified visual loss: Secondary | ICD-10-CM | POA: Diagnosis not present

## 2020-05-26 ENCOUNTER — Ambulatory Visit (INDEPENDENT_AMBULATORY_CARE_PROVIDER_SITE_OTHER): Payer: PPO | Admitting: Family Medicine

## 2020-05-26 ENCOUNTER — Other Ambulatory Visit: Payer: Self-pay

## 2020-05-26 ENCOUNTER — Encounter: Payer: Self-pay | Admitting: Family Medicine

## 2020-05-26 VITALS — BP 102/64 | HR 69 | Temp 97.8°F | Ht 66.0 in | Wt 180.0 lb

## 2020-05-26 DIAGNOSIS — J189 Pneumonia, unspecified organism: Secondary | ICD-10-CM

## 2020-05-26 DIAGNOSIS — H47233 Glaucomatous optic atrophy, bilateral: Secondary | ICD-10-CM | POA: Diagnosis not present

## 2020-05-26 DIAGNOSIS — H547 Unspecified visual loss: Secondary | ICD-10-CM | POA: Diagnosis not present

## 2020-05-26 DIAGNOSIS — E1137X1 Type 2 diabetes mellitus with diabetic macular edema, resolved following treatment, right eye: Secondary | ICD-10-CM | POA: Diagnosis not present

## 2020-05-26 DIAGNOSIS — E113312 Type 2 diabetes mellitus with moderate nonproliferative diabetic retinopathy with macular edema, left eye: Secondary | ICD-10-CM | POA: Diagnosis not present

## 2020-05-26 MED ORDER — GUAIFENESIN ER 600 MG PO TB12: 600.0000 mg | ORAL_TABLET | Freq: Two times a day (BID) | ORAL | 1 refills | Status: DC

## 2020-05-26 NOTE — Progress Notes (Signed)
BP 102/64   Pulse 69   Temp 97.8 F (36.6 C)   Ht '5\' 6"'  (1.676 m)   Wt 180 lb (81.6 kg)   SpO2 95%   BMI 29.05 kg/m    Subjective:   Patient ID: Ethan Peters, male    DOB: 1956-05-12, 64 y.o.   MRN: [id number]  HPI: Ethan Peters is a 64 y.o. male presenting on 05/26/2020 for Pneumonia (follow up)   HPI Patient is coming in for pneumonia follow-up.  He says his breathing is a lot better but still has a cough especially at night that is bothering him, he is still finishing up the doxycycline but did finish up the furosemide.  He feels like he is doing a lot better.  Patient has some new leg blindness that is been looked at by both an ophthalmologist and neurologist and they think he may have had a stroke that caused some of his vision troubles but have not truly found a cause and he is still following up with them but he is almost completely blind in 1 eye and has very low vision in the other eye and because of that he has become almost completely sedentary and staying at home.  Relevant past medical, surgical, family and social history reviewed and updated as indicated. Interim medical history since our last visit reviewed. Allergies and medications reviewed and updated.  Review of Systems  Constitutional: Negative for chills and fever.  Eyes: Positive for visual disturbance.  Respiratory: Negative for shortness of breath and wheezing.   Cardiovascular: Negative for chest pain and leg swelling.  Musculoskeletal: Negative for back pain and gait problem.  Skin: Negative for rash.  All other systems reviewed and are negative.   Per HPI unless specifically indicated above   Allergies as of 05/26/2020      Reactions   Levemir [insulin Detemir] Swelling   Lisinopril Cough   Trazodone And Nefazodone    Penicillins Rash   Lip swelling      Medication List       Accurate as of May 26, 2020  4:28 PM. If you have any questions, ask your nurse or doctor.          STOP taking these medications   furosemide 20 MG tablet Commonly known as: LASIX Stopped by: Fransisca Kaufmann Bluford Sedler, MD     TAKE these medications   allopurinol 300 MG tablet Commonly known as: ZYLOPRIM TAKE ONE TABLET BY MOUTH ONCE DAILY AS NEEDED FOR GOUT prevention   Alphagan P 0.15 % ophthalmic solution Generic drug: brimonidine Place 1 drop into both eyes 2 (two) times daily.   aspirin 81 MG EC tablet Take 1 tablet (81 mg total) by mouth daily.   atorvastatin 80 MG tablet Commonly known as: LIPITOR TAKE 1 TABLET BY MOUTH ONCE DAILY AT 6 PM FOR CHOLESTEROL What changed: See the new instructions.   B-complex with vitamin C tablet Take 1 tablet by mouth as needed (for vitamin deficiency).   Blood Glucose Monitor System w/Device Kit 1 Device by Does not apply route 2 (two) times daily.   candesartan 16 MG tablet Commonly known as: ATACAND Take 1 tablet (16 mg total) by mouth daily.   cholecalciferol 25 MCG (1000 UNIT) tablet Commonly known as: VITAMIN D3 Take 1,000 Units by mouth daily.   dorzolamide-timolol 22.3-6.8 MG/ML ophthalmic solution Commonly known as: COSOPT Place 1 drop into the right eye 2 (two) times daily.   doxycycline 100 MG capsule Commonly  known as: VIBRAMYCIN Take 1 capsule (100 mg total) by mouth 2 (two) times daily for 10 days.   glipiZIDE 5 MG tablet Commonly known as: GLUCOTROL TAKE 1 TABLET (5 MG TOTAL) BY MOUTH DAILY BEFORE BREAKFAST.   ibuprofen 800 MG tablet Commonly known as: ADVIL Take 1 tablet (800 mg total) by mouth every 8 (eight) hours as needed for moderate pain.   insulin aspart protamine- aspart (70-30) 100 UNIT/ML injection Commonly known as: NovoLOG Mix 70/30 44 units before breakfast and 40 units before supper daily   latanoprost 0.005 % ophthalmic solution Commonly known as: XALATAN Place 1 drop into both eyes at bedtime.   levothyroxine 50 MCG tablet Commonly known as: SYNTHROID TAKE 1 TABLET BY MOUTH EVERY DAY    metFORMIN 1000 MG tablet Commonly known as: GLUCOPHAGE TAKE 1 TABLET (1,000 MG TOTAL) BY MOUTH 2 (TWO) TIMES DAILY WITH A MEAL.   nitroGLYCERIN 0.4 MG SL tablet Commonly known as: NITROSTAT Place 1 tablet (0.4 mg total) under the tongue every 5 (five) minutes x 3 doses as needed for chest pain.   OneTouch Verio test strip Generic drug: glucose blood USE 1 STRIP TO CHECK GLUCOSE 4 TIMES DAILY DX E13.42        Objective:   BP 102/64   Pulse 69   Temp 97.8 F (36.6 C)   Ht '5\' 6"'  (1.676 m)   Wt 180 lb (81.6 kg)   SpO2 95%   BMI 29.05 kg/m   Wt Readings from Last 3 Encounters:  05/26/20 180 lb (81.6 kg)  04/28/20 183 lb 6 oz (83.2 kg)  03/24/20 185 lb (83.9 kg)    Physical Exam Vitals and nursing note reviewed.  Constitutional:      General: He is not in acute distress.    Appearance: He is well-developed. He is not diaphoretic.  Eyes:     General: No scleral icterus.    Conjunctiva/sclera: Conjunctivae normal.  Neck:     Thyroid: No thyromegaly.  Cardiovascular:     Rate and Rhythm: Normal rate and regular rhythm.     Heart sounds: Normal heart sounds. No murmur heard.   Pulmonary:     Effort: Pulmonary effort is normal. No respiratory distress.     Breath sounds: Normal breath sounds. No wheezing.  Musculoskeletal:        General: Normal range of motion.     Cervical back: Neck supple.  Lymphadenopathy:     Cervical: No cervical adenopathy.  Skin:    General: Skin is warm and dry.     Findings: No rash.  Neurological:     Mental Status: He is alert and oriented to person, place, and time.     Coordination: Coordination normal.  Psychiatric:        Behavior: Behavior normal.       Assessment & Plan:   Problem List Items Addressed This Visit    None    Visit Diagnoses    Community acquired pneumonia, unspecified laterality    -  Primary   Relevant Medications   guaiFENesin (MUCINEX) 600 MG 12 hr tablet   Acquired blindness       Relevant  Orders   Ambulatory referral to Occupational Therapy      Will refer patient for low vision training, will also monitor for breathing, recommended Mucinex and finish doxycycline, already doing a lot better   Follow up plan: Return in about 2 months (around 07/26/2020), or if symptoms worsen or fail to improve,  for Recheck breathing.  Counseling provided for all of the vaccine components No orders of the defined types were placed in this encounter.   Caryl Pina, MD [facility] 05/26/2020, 4:28 PM

## 2020-05-27 ENCOUNTER — Other Ambulatory Visit: Payer: Self-pay | Admitting: Neurosurgery

## 2020-05-27 ENCOUNTER — Other Ambulatory Visit (HOSPITAL_COMMUNITY): Payer: Self-pay | Admitting: Neurosurgery

## 2020-05-27 DIAGNOSIS — S065X9A Traumatic subdural hemorrhage with loss of consciousness of unspecified duration, initial encounter: Secondary | ICD-10-CM

## 2020-05-27 DIAGNOSIS — S065XAA Traumatic subdural hemorrhage with loss of consciousness status unknown, initial encounter: Secondary | ICD-10-CM

## 2020-06-06 ENCOUNTER — Other Ambulatory Visit: Payer: Self-pay

## 2020-06-06 ENCOUNTER — Ambulatory Visit (INDEPENDENT_AMBULATORY_CARE_PROVIDER_SITE_OTHER): Payer: PPO | Admitting: Pharmacist

## 2020-06-06 DIAGNOSIS — E119 Type 2 diabetes mellitus without complications: Secondary | ICD-10-CM

## 2020-06-06 NOTE — Progress Notes (Signed)
° ° °  06/06/2020 Name: Ethan Peters MRN: [id number] DOB: Apr 25, 1956   S:  63 yoM presents for diabetes evaluation, education, and management Patient was referred and last seen by Primary Care Provider on 05/26/20  Insurance coverage/medication affordability: [employer] medicare  Patient reports adherence with medications.  Son helps with patient's medications due to vision.  Current diabetes medications include: glipizide, 70/30 insulin, metformin  Current hypertension medications include: candesartan Goal 130/80  Current hyperlipidemia medications include: atorvastatin  Patient denies hypoglycemic events.   Patient reported dietary habits: Eats 2-3 meals/day Discussed meal planning options and Plate method for healthy eating  Avoid sugary drinks and desserts  Incorporate balanced protein, non starchy veggies, 1 serving of carbohydrate with each meal  Increase water intake  Increase physical activity as able  Patient-reported exercise habits: n/a  O:  Lab Results  Component Value Date   HGBA1C 7.4 (H) 01/28/2020    Lipid Panel     Component Value Date/Time   CHOL 91 (L) 01/28/2020 0829   TRIG 101 01/28/2020 0829   TRIG 77 06/01/2013 0841   HDL 30 (L) 01/28/2020 0829   HDL 44 06/01/2013 0841   CHOLHDL 3.0 01/28/2020 0829   CHOLHDL 2.9 06/20/2016 0923   VLDL 20 06/20/2016 0923   LDLCALC 42 01/28/2020 0829   LDLCALC 45 06/01/2013 0841     Home fasting blood sugars: 100-120s  2 hour post-meal/random blood sugars: n/a.     A/P:  Diabetes T2DM, A1C currently 7.4%. Patient is able to verbalize appropriate hypoglycemia management plan.  Patient is adherent with medication.  His son assists him with his medications due to vision loss.    -Will continue with current regimen at this time due to cost and vision issues.  Patient is following up with his eye specialist and hoping to regain vision.  We can revisit diabetes regimen at that time.  Patient and son would like  to continue current regimen.  No changes at this time  -Plan to change to once daily basal insulin with SGLT2/GLP1 as needed  -Will call in talking glucometer as covered by [employer] medicare  -Patient requesting PNA vaccine, however he has already been vaccinated and does not meet criteria for additional booster  Pneumococcal Conjugate-13 11/27/2016   Pneumococcal Polysaccharide-23 09/25/2007, 08/13/2018, 09/30/2019   -Extensively discussed pathophysiology of diabetes, recommended lifestyle interventions, dietary effects on blood sugar control  -Counseled on s/sx of and management of hypoglycemia   Written patient instructions provided.  Total time in face to face counseling 30 minutes.   Kieth Brightly, PharmD, BCPS Clinical Pharmacist, [facility]  II Phone [phone]

## 2020-06-10 ENCOUNTER — Other Ambulatory Visit: Payer: Self-pay

## 2020-06-10 ENCOUNTER — Ambulatory Visit (HOSPITAL_COMMUNITY)
Admission: RE | Admit: 2020-06-10 | Discharge: 2020-06-10 | Disposition: A | Discharge status: Home / Self Care | Payer: PPO | Source: Ambulatory Visit | Attending: Neurosurgery | Admitting: Neurosurgery

## 2020-06-10 DIAGNOSIS — S065X9A Traumatic subdural hemorrhage with loss of consciousness of unspecified duration, initial encounter: Secondary | ICD-10-CM | POA: Diagnosis not present

## 2020-06-10 DIAGNOSIS — S065X0A Traumatic subdural hemorrhage without loss of consciousness, initial encounter: Secondary | ICD-10-CM | POA: Diagnosis not present

## 2020-06-10 DIAGNOSIS — S065XAA Traumatic subdural hemorrhage with loss of consciousness status unknown, initial encounter: Secondary | ICD-10-CM

## 2020-06-10 DIAGNOSIS — I6203 Nontraumatic chronic subdural hemorrhage: Secondary | ICD-10-CM | POA: Diagnosis not present

## 2020-06-10 DIAGNOSIS — G9389 Other specified disorders of brain: Secondary | ICD-10-CM | POA: Diagnosis not present

## 2020-06-10 DIAGNOSIS — I7090 Unspecified atherosclerosis: Secondary | ICD-10-CM | POA: Diagnosis not present

## 2020-06-13 ENCOUNTER — Telehealth: Payer: Self-pay | Admitting: Family Medicine

## 2020-06-13 DIAGNOSIS — I6203 Nontraumatic chronic subdural hemorrhage: Secondary | ICD-10-CM | POA: Diagnosis not present

## 2020-06-13 NOTE — Telephone Encounter (Signed)
Pts wife came in to office requesting that Dr Dettinger write him a letter or fill out a form for pt to get a handicap sticker since pt is blind.

## 2020-06-15 NOTE — Telephone Encounter (Signed)
I signed a form and gave it to my nurse

## 2020-06-15 NOTE — Telephone Encounter (Signed)
Left detailed message.   

## 2020-06-16 ENCOUNTER — Other Ambulatory Visit: Payer: Self-pay | Admitting: Family Medicine

## 2020-06-21 ENCOUNTER — Other Ambulatory Visit: Payer: Self-pay | Admitting: Family Medicine

## 2020-06-22 ENCOUNTER — Other Ambulatory Visit (HOSPITAL_COMMUNITY): Payer: Self-pay | Admitting: Neurosurgery

## 2020-06-22 ENCOUNTER — Other Ambulatory Visit: Payer: Self-pay | Admitting: Neurosurgery

## 2020-06-22 DIAGNOSIS — S065XAA Traumatic subdural hemorrhage with loss of consciousness status unknown, initial encounter: Secondary | ICD-10-CM

## 2020-07-08 ENCOUNTER — Telehealth: Payer: Self-pay | Admitting: Pharmacist

## 2020-07-08 MED ORDER — PRODIGY VOICE BLOOD GLUCOSE W/DEVICE KIT: PACK | 0 refills | Status: DC

## 2020-07-08 MED ORDER — GLUCOSE BLOOD VI STRP: ORAL_STRIP | 12 refills | Status: DC

## 2020-07-08 NOTE — Telephone Encounter (Signed)
Attempting to get [employer] glucometer RX called in for [employer]ICE

## 2020-07-21 DIAGNOSIS — E113312 Type 2 diabetes mellitus with moderate nonproliferative diabetic retinopathy with macular edema, left eye: Secondary | ICD-10-CM | POA: Diagnosis not present

## 2020-07-21 DIAGNOSIS — E1137X1 Type 2 diabetes mellitus with diabetic macular edema, resolved following treatment, right eye: Secondary | ICD-10-CM | POA: Diagnosis not present

## 2020-07-21 DIAGNOSIS — H47233 Glaucomatous optic atrophy, bilateral: Secondary | ICD-10-CM | POA: Diagnosis not present

## 2020-07-21 DIAGNOSIS — H5410 Blindness, one eye, low vision other eye, unspecified eyes: Secondary | ICD-10-CM | POA: Diagnosis not present

## 2020-07-29 ENCOUNTER — Telehealth: Payer: Self-pay

## 2020-07-29 NOTE — Telephone Encounter (Signed)
Attempted to contact - NA 

## 2020-07-29 NOTE — Telephone Encounter (Signed)
Patient is wanting another pneumonia shot this year.  Had pneumonia again this year and wants to get another pneumonia shot because of this.  Had one in 2020.  Please advise and call patient and let him know if okay to do this.  He seems very adamant about it.

## 2020-08-01 ENCOUNTER — Encounter (INDEPENDENT_AMBULATORY_CARE_PROVIDER_SITE_OTHER): Payer: PPO | Admitting: Ophthalmology

## 2020-08-03 ENCOUNTER — Ambulatory Visit (HOSPITAL_COMMUNITY): Payer: PPO

## 2020-08-04 NOTE — Telephone Encounter (Signed)
Patient states that he had pneumonia in August and wants another pneumonia vaccine.  Aware he is not due for one but still wants another one.  Please advise

## 2020-08-04 NOTE — Telephone Encounter (Signed)
Patient aware.

## 2020-08-04 NOTE — Telephone Encounter (Signed)
Patient can have a pneumonia vaccine once he turns 25 which his birthday is a month from now.

## 2020-08-12 ENCOUNTER — Other Ambulatory Visit: Payer: Self-pay

## 2020-08-12 ENCOUNTER — Ambulatory Visit (HOSPITAL_COMMUNITY)
Admission: RE | Admit: 2020-08-12 | Discharge: 2020-08-12 | Disposition: A | Discharge status: Home / Self Care | Payer: PPO | Source: Ambulatory Visit | Attending: Neurosurgery | Admitting: Neurosurgery

## 2020-08-12 DIAGNOSIS — I6523 Occlusion and stenosis of bilateral carotid arteries: Secondary | ICD-10-CM | POA: Diagnosis not present

## 2020-08-12 DIAGNOSIS — S065X9A Traumatic subdural hemorrhage with loss of consciousness of unspecified duration, initial encounter: Secondary | ICD-10-CM | POA: Diagnosis not present

## 2020-08-12 DIAGNOSIS — I6200 Nontraumatic subdural hemorrhage, unspecified: Secondary | ICD-10-CM | POA: Diagnosis not present

## 2020-08-12 DIAGNOSIS — G9389 Other specified disorders of brain: Secondary | ICD-10-CM | POA: Diagnosis not present

## 2020-08-12 DIAGNOSIS — S065XAA Traumatic subdural hemorrhage with loss of consciousness status unknown, initial encounter: Secondary | ICD-10-CM

## 2020-08-12 DIAGNOSIS — Z87828 Personal history of other (healed) physical injury and trauma: Secondary | ICD-10-CM | POA: Diagnosis not present

## 2020-08-16 DIAGNOSIS — Z6826 Body mass index (BMI) 26.0-26.9, adult: Secondary | ICD-10-CM | POA: Diagnosis not present

## 2020-08-16 DIAGNOSIS — I6203 Nontraumatic chronic subdural hemorrhage: Secondary | ICD-10-CM | POA: Diagnosis not present

## 2020-08-16 DIAGNOSIS — R030 Elevated blood-pressure reading, without diagnosis of hypertension: Secondary | ICD-10-CM | POA: Diagnosis not present

## 2020-08-24 ENCOUNTER — Encounter (INDEPENDENT_AMBULATORY_CARE_PROVIDER_SITE_OTHER): Payer: PPO | Admitting: Ophthalmology

## 2020-08-24 DIAGNOSIS — I10 Essential (primary) hypertension: Secondary | ICD-10-CM

## 2020-08-24 DIAGNOSIS — E113513 Type 2 diabetes mellitus with proliferative diabetic retinopathy with macular edema, bilateral: Secondary | ICD-10-CM

## 2020-08-24 DIAGNOSIS — E11311 Type 2 diabetes mellitus with unspecified diabetic retinopathy with macular edema: Secondary | ICD-10-CM | POA: Diagnosis not present

## 2020-08-24 DIAGNOSIS — H43813 Vitreous degeneration, bilateral: Secondary | ICD-10-CM | POA: Diagnosis not present

## 2020-08-24 DIAGNOSIS — H35033 Hypertensive retinopathy, bilateral: Secondary | ICD-10-CM

## 2020-08-30 ENCOUNTER — Other Ambulatory Visit: Payer: Self-pay

## 2020-08-30 ENCOUNTER — Ambulatory Visit (INDEPENDENT_AMBULATORY_CARE_PROVIDER_SITE_OTHER): Payer: PPO

## 2020-08-30 DIAGNOSIS — Z23 Encounter for immunization: Secondary | ICD-10-CM

## 2020-09-13 ENCOUNTER — Other Ambulatory Visit: Payer: Self-pay | Admitting: Family Medicine

## 2020-09-19 ENCOUNTER — Telehealth: Payer: Self-pay

## 2020-09-19 MED ORDER — METFORMIN HCL 1000 MG PO TABS: 1000.0000 mg | ORAL_TABLET | Freq: Two times a day (BID) | ORAL | 0 refills | Status: DC

## 2020-09-19 MED ORDER — GLIPIZIDE 5 MG PO TABS: 5.0000 mg | ORAL_TABLET | Freq: Every day | ORAL | 0 refills | Status: DC

## 2020-09-19 MED ORDER — METFORMIN HCL 1000 MG PO TABS
1000.0000 mg | ORAL_TABLET | Freq: Two times a day (BID) | ORAL | 0 refills | Status: DC
Start: 2020-09-19 — End: 2020-10-26

## 2020-09-19 MED ORDER — ATORVASTATIN CALCIUM 80 MG PO TABS: ORAL_TABLET | ORAL | 0 refills | Status: DC

## 2020-09-19 NOTE — Telephone Encounter (Signed)
  Prescription Request  09/19/2020  What is the name of the medication or equipment? Atorvastatin, Metformin, Glipizide  Have you contacted your pharmacy to request a refill? (if applicable) Yes  Which pharmacy would you like this sent to? [facility]  Pt will run out of these meds before his appt on 10/26/20. There are no openings before his appt. Needs another refill on these meds.   Patient notified that their request is being sent to the clinical staff for review and that they should receive a response within 2 business days.

## 2020-09-19 NOTE — Telephone Encounter (Signed)
Aware refills sent to pharmacy, due to patients insurance did change and send them in for a 90d supply and instructed patient to make his appt in January

## 2020-10-04 DIAGNOSIS — H543 Unqualified visual loss, both eyes: Secondary | ICD-10-CM | POA: Diagnosis not present

## 2020-10-04 DIAGNOSIS — H26491 Other secondary cataract, right eye: Secondary | ICD-10-CM | POA: Diagnosis not present

## 2020-10-04 DIAGNOSIS — H35372 Puckering of macula, left eye: Secondary | ICD-10-CM | POA: Diagnosis not present

## 2020-10-04 DIAGNOSIS — Z9841 Cataract extraction status, right eye: Secondary | ICD-10-CM | POA: Diagnosis not present

## 2020-10-04 DIAGNOSIS — Z9842 Cataract extraction status, left eye: Secondary | ICD-10-CM | POA: Diagnosis not present

## 2020-10-04 DIAGNOSIS — Z7984 Long term (current) use of oral hypoglycemic drugs: Secondary | ICD-10-CM | POA: Diagnosis not present

## 2020-10-04 DIAGNOSIS — H47233 Glaucomatous optic atrophy, bilateral: Secondary | ICD-10-CM | POA: Diagnosis not present

## 2020-10-04 DIAGNOSIS — E11319 Type 2 diabetes mellitus with unspecified diabetic retinopathy without macular edema: Secondary | ICD-10-CM | POA: Diagnosis not present

## 2020-10-04 DIAGNOSIS — H4089 Other specified glaucoma: Secondary | ICD-10-CM | POA: Diagnosis not present

## 2020-10-04 DIAGNOSIS — Z961 Presence of intraocular lens: Secondary | ICD-10-CM | POA: Diagnosis not present

## 2020-10-04 DIAGNOSIS — E11311 Type 2 diabetes mellitus with unspecified diabetic retinopathy with macular edema: Secondary | ICD-10-CM | POA: Diagnosis not present

## 2020-10-18 DIAGNOSIS — I6203 Nontraumatic chronic subdural hemorrhage: Secondary | ICD-10-CM | POA: Diagnosis not present

## 2020-10-18 DIAGNOSIS — Z6830 Body mass index (BMI) 30.0-30.9, adult: Secondary | ICD-10-CM | POA: Diagnosis not present

## 2020-10-18 NOTE — Progress Notes (Signed)
Ethan Peters Date of Birth: Apr 12, 1956 Medical Record #[id number]  History of Present Illness: Ethan Peters is seen for followup of CAD. He is status post inferior STEMI on 01/09/2013. He had stenting of the RCA at the crux. He had persistent chest pain and dyspnea even after his infarct with atypical symptoms. He subsequently underwent repeat cardiac catheterization on April 10,2014 which showed excellent patency of the stent in the RCA. He does have a long 70% stenosis in the left circumflex,  treated medically. He had a Myovew study in September 2017 which showed an inferior scar without ischemia. EF was 48%.  He has a history of poorly controlled diabetes mellitus with retinopathy and neuropathy.   He was seen in the ED in August 2021with progressive left eye blindness. MRI done showed an incidental chronic SDH. Evaluated by Neurosurgery and no further therapy needed.   On follow up today his major complaint is that he is now blind. He does complain with intermittent pain in his left chest radiating to the left arm with numbness in his left hand. This is not related to exertion.  Has not had to use Ntg.   Current Outpatient Medications on File Prior to Visit  Medication Sig Dispense Refill  . allopurinol (ZYLOPRIM) 300 MG tablet TAKE ONE TABLET BY MOUTH ONCE DAILY AS NEEDED FOR GOUT prevention 90 tablet 1  . ALPHAGAN P 0.15 % ophthalmic solution Place 1 drop into both eyes 2 (two) times daily.    Marland Kitchen aspirin EC 81 MG EC tablet Take 1 tablet (81 mg total) by mouth daily.    Marland Kitchen atorvastatin (LIPITOR) 80 MG tablet TAKE 1 TABLET BY MOUTH ONCE DAILY AT 6 PM FOR CHOLESTEROL 90 tablet 0  . B Complex-C (B-COMPLEX WITH VITAMIN C) tablet Take 1 tablet by mouth as needed (for vitamin deficiency).    . Blood Glucose Monitoring Suppl (PRODIGY VOICE BLOOD GLUCOSE) w/Device KIT Use to test blood sugar up to 6 times daily. DX:E11.9 Patient needs talking glucometer 1 kit 0  . candesartan (ATACAND) 16 MG  tablet Take 1 tablet (16 mg total) by mouth daily. 90 tablet 3  . cholecalciferol (VITAMIN D3) 25 MCG (1000 UNIT) tablet Take 1,000 Units by mouth daily.    . dorzolamide-timolol (COSOPT) 22.3-6.8 MG/ML ophthalmic solution Place 1 drop into the right eye 2 (two) times daily.    Marland Kitchen glipiZIDE (GLUCOTROL) 5 MG tablet Take 1 tablet (5 mg total) by mouth daily before breakfast. 90 tablet 0  . glucose blood test strip Use as instructed to test blood sugar up to 6 times daily. DX: E11.9 300 each 12  . ibuprofen (ADVIL) 800 MG tablet Take 1 tablet (800 mg total) by mouth every 8 (eight) hours as needed for moderate pain. 270 tablet 1  . insulin aspart protamine- aspart (NOVOLOG MIX 70/30) (70-30) 100 UNIT/ML injection 44 units before breakfast and 40 units before supper daily 90 mL 3  . latanoprost (XALATAN) 0.005 % ophthalmic solution Place 1 drop into both eyes at bedtime.    Marland Kitchen levothyroxine (SYNTHROID) 50 MCG tablet TAKE 1 TABLET BY MOUTH EVERY DAY 90 tablet 3  . metFORMIN (GLUCOPHAGE) 1000 MG tablet Take 1 tablet (1,000 mg total) by mouth 2 (two) times daily with a meal. 180 tablet 0  . nitroGLYCERIN (NITROSTAT) 0.4 MG SL tablet Place 1 tablet (0.4 mg total) under the tongue every 5 (five) minutes x 3 doses as needed for chest pain. 75 tablet 3   No current  facility-administered medications on file prior to visit.    Allergies  Allergen Reactions  . Levemir [Insulin Detemir] Swelling  . Lisinopril Cough  . Trazodone And Nefazodone   . Penicillins Rash    Lip swelling    Past Medical History:  Diagnosis Date  . CAD (coronary artery disease) 01/09/13   Inferior STEMI s/p DES-RCA  . Diabetes mellitus    Uncontrolled, Hgb A1C 14.8% on 03/14, started on insulin  . Gout   . Hypertension   . Ischemic cardiomyopathy    EF 03-00%, grade 1 diastolic dysfunction, mildly dilated RV, RA at the upper limits of normal, mild TR, PA systolic pressure 32 mm mercury and hypokinesis to akinesis of the basal  mid inferior myocardium  . Myocardial infarction ([place]) 01/09/2013  . Shortness of breath     Past Surgical History:  Procedure Laterality Date  . CARDIAC CATHETERIZATION  01/22/2013   Diffuse borderline residual CAD consistent with uncontrolled diabetes, medical management recommended  . CORONARY ANGIOPLASTY WITH STENT PLACEMENT  01/09/2013   30% pLAD, 30% mLAD, 70% pLCx, RCA occlusion at crux with R->L distal collaterals s/p DES; LVEF 50%, moderate-severe HK of inferior basal wall  . EYE SURGERY Bilateral   . LEFT HEART CATHETERIZATION WITH CORONARY ANGIOGRAM N/A 01/09/2013   Procedure: LEFT HEART CATHETERIZATION WITH CORONARY ANGIOGRAM;  Surgeon: Latajah Thuman M Martinique, MD;  Location: [facility] CATH LAB;  Service: Cardiovascular;  Laterality: N/A;  . LEFT HEART CATHETERIZATION WITH CORONARY ANGIOGRAM N/A 01/22/2013   Procedure: LEFT HEART CATHETERIZATION WITH CORONARY ANGIOGRAM;  Surgeon: Sherren Mocha, MD;  Location: [facility] CATH LAB;  Service: Cardiovascular;  Laterality: N/A;    Social History   Tobacco Use  Smoking Status Never Smoker  Smokeless Tobacco Never Used    Social History   Substance and Sexual Activity  Alcohol Use Yes   Comment: 4 times per year-wine or beer    Family History  Problem Relation Age of Onset  . Heart disease Mother   . Heart disease Father   . Heart disease Brother   . Heart disease Brother     Review of Systems: As noted in history of present illness.  All other systems were reviewed and are negative.  Physical Exam: BP 120/70   Pulse 69   Ht '5\' 6"'  (1.676 m)   Wt 181 lb 12.8 oz (82.5 kg)   SpO2 98%   BMI 29.34 kg/m  GENERAL:  Well appearing male in NAD HEENT:  PERRL, EOMI, sclera are clear. Oropharynx is clear. NECK:  No jugular venous distention, carotid upstroke brisk and symmetric, no bruits, no thyromegaly or adenopathy LUNGS:  Clear to auscultation bilaterally CHEST:  Unremarkable HEART:  RRR,  PMI not displaced or sustained,S1 and S2 within  normal limits, no S3, no S4: no clicks, no rubs, no murmurs ABD:  Soft, nontender. BS +, no masses or bruits. No hepatomegaly, no splenomegaly EXT:  2 + pulses throughout, no edema, no cyanosis no clubbing SKIN:  Warm and dry.  No rashes NEURO:  Alert and oriented x 3. Cranial nerves II through XII intact. PSYCH:  Cognitively intact      LABORATORY DATA: Lab Results  Component Value Date   WBC 5.7 05/20/2020   HGB 13.1 05/20/2020   HCT 37.8 05/20/2020   PLT 217 05/20/2020   GLUCOSE 214 (H) 05/20/2020   CHOL 91 (L) 01/28/2020   TRIG 101 01/28/2020   HDL 30 (L) 01/28/2020   LDLCALC 42 01/28/2020   ALT 13 05/03/2020  AST 14 05/03/2020   NA 139 05/20/2020   K 4.6 05/20/2020   CL 103 05/20/2020   CREATININE 1.06 05/20/2020   BUN 12 05/20/2020   CO2 24 05/20/2020   TSH 2.300 01/28/2020   PSA 0.6 09/22/2014   INR 1.16 01/21/2013   HGBA1C 7.4 (H) 01/28/2020   MICROALBUR neg 11/02/2014   Last A1c 8.1% on 04/10/16  Ecg today shows NSR with old inferior infarct. Rate 69. Otherwise normal. I have personally reviewed and interpreted this study.  Myoview 06/26/16:Study Highlights     The left ventricular ejection fraction is mildly decreased (45-54%).  Nuclear stress EF: 48%.  Blood pressure demonstrated a hypertensive response to exercise.  There was no ST segment deviation noted during stress.  Findings consistent with prior myocardial infarction.  This is an intermediate risk study.   Small inferobasal wall infarct no ischemia EF 48%      Assessment / Plan: 1. Coronary disease status post inferior STEMI 2014 treated with DES to the RCA. Repeat cardiac catheterization in 2014 demonstrated continued patency. Moderate diffuse disease in the left circumflex. No ischemia by [facility] September 2017. He is having intermittent chest pain. Unclear if this is really anginal.  We will continue aspirin and statin. Recommend follow up [place] study.  2. Diabetes  mellitus: On metformin, glipizide, and insulin . Followed by primary care.  3. Dyslipidemia. On statin therapy. Excellent control.   4. Hypertension, BP good control on current medications  5. Diabetic retinopathy and neuropathy. Now legally blind.   6. Chronic subdural Hematoma. Seen by Neurosurgery. No additional therapy needed.   Follow up in 6 months.

## 2020-10-21 ENCOUNTER — Other Ambulatory Visit: Payer: Self-pay

## 2020-10-21 ENCOUNTER — Ambulatory Visit: Payer: PPO | Admitting: Cardiology

## 2020-10-21 ENCOUNTER — Encounter: Payer: Self-pay | Admitting: Cardiology

## 2020-10-21 VITALS — BP 120/70 | HR 69 | Ht 66.0 in | Wt 181.8 lb

## 2020-10-21 DIAGNOSIS — Z794 Long term (current) use of insulin: Secondary | ICD-10-CM

## 2020-10-21 DIAGNOSIS — E782 Mixed hyperlipidemia: Secondary | ICD-10-CM

## 2020-10-21 DIAGNOSIS — E0840 Diabetes mellitus due to underlying condition with diabetic neuropathy, unspecified: Secondary | ICD-10-CM

## 2020-10-21 DIAGNOSIS — I25118 Atherosclerotic heart disease of native coronary artery with other forms of angina pectoris: Secondary | ICD-10-CM | POA: Diagnosis not present

## 2020-10-21 DIAGNOSIS — I2510 Atherosclerotic heart disease of native coronary artery without angina pectoris: Secondary | ICD-10-CM

## 2020-10-21 DIAGNOSIS — I10 Essential (primary) hypertension: Secondary | ICD-10-CM

## 2020-10-21 NOTE — Patient Instructions (Signed)
Medication Instructions:  Continue same medications *If you need a refill on your cardiac medications before your next appointment, please call your pharmacy*   Lab Work: None ordered   Testing/Procedures: Lexiscan   Follow-Up: At [facility], you and your health needs are our priority.  As part of our continuing mission to provide you with exceptional heart care, we have created designated Provider Care Teams.  These Care Teams include your primary Cardiologist (physician) and Advanced Practice Providers (APPs -  Physician Assistants and Nurse Practitioners) who all work together to provide you with the care you need, when you need it.  We recommend signing up for the patient portal called "MyChart".  Sign up information is provided on this After Visit Summary.  MyChart is used to connect with patients for Virtual Visits (Telemedicine).  Patients are able to view lab/test results, encounter notes, upcoming appointments, etc.  Non-urgent messages can be sent to your provider as well.   To learn more about what you can do with MyChart, go to [email or web address].    Your next appointment:  6 months    Call in March to schedule July appointment    The format for your next appointment:  Office     Provider:  Dr.Jordan

## 2020-10-26 ENCOUNTER — Ambulatory Visit (INDEPENDENT_AMBULATORY_CARE_PROVIDER_SITE_OTHER): Payer: PPO | Admitting: Family Medicine

## 2020-10-26 ENCOUNTER — Encounter: Payer: Self-pay | Admitting: Family Medicine

## 2020-10-26 ENCOUNTER — Other Ambulatory Visit: Payer: Self-pay

## 2020-10-26 VITALS — BP 110/65 | HR 52 | Ht 66.0 in | Wt 182.0 lb

## 2020-10-26 DIAGNOSIS — Z1211 Encounter for screening for malignant neoplasm of colon: Secondary | ICD-10-CM | POA: Diagnosis not present

## 2020-10-26 DIAGNOSIS — E0840 Diabetes mellitus due to underlying condition with diabetic neuropathy, unspecified: Secondary | ICD-10-CM | POA: Diagnosis not present

## 2020-10-26 DIAGNOSIS — E1342 Other specified diabetes mellitus with diabetic polyneuropathy: Secondary | ICD-10-CM

## 2020-10-26 DIAGNOSIS — Z125 Encounter for screening for malignant neoplasm of prostate: Secondary | ICD-10-CM

## 2020-10-26 DIAGNOSIS — I10 Essential (primary) hypertension: Secondary | ICD-10-CM | POA: Diagnosis not present

## 2020-10-26 DIAGNOSIS — E1159 Type 2 diabetes mellitus with other circulatory complications: Secondary | ICD-10-CM

## 2020-10-26 DIAGNOSIS — E782 Mixed hyperlipidemia: Secondary | ICD-10-CM | POA: Diagnosis not present

## 2020-10-26 DIAGNOSIS — E039 Hypothyroidism, unspecified: Secondary | ICD-10-CM

## 2020-10-26 DIAGNOSIS — E119 Type 2 diabetes mellitus without complications: Secondary | ICD-10-CM

## 2020-10-26 DIAGNOSIS — E11319 Type 2 diabetes mellitus with unspecified diabetic retinopathy without macular edema: Secondary | ICD-10-CM

## 2020-10-26 LAB — BAYER DCA HB A1C WAIVED: HB A1C (BAYER DCA - WAIVED): 7.4 % — ABNORMAL HIGH (ref <7.0)

## 2020-10-26 MED ORDER — ALLOPURINOL 300 MG PO TABS
ORAL_TABLET | ORAL | 3 refills | Status: DC
Start: 2020-10-26 — End: 2022-03-02

## 2020-10-26 MED ORDER — LEVOTHYROXINE SODIUM 50 MCG PO TABS: 50.0000 ug | ORAL_TABLET | Freq: Every day | ORAL | 3 refills | Status: DC

## 2020-10-26 MED ORDER — CANDESARTAN CILEXETIL 16 MG PO TABS: 16.0000 mg | ORAL_TABLET | Freq: Every day | ORAL | 3 refills | Status: DC

## 2020-10-26 MED ORDER — GLIPIZIDE 5 MG PO TABS: 5.0000 mg | ORAL_TABLET | Freq: Every day | ORAL | 3 refills | Status: DC

## 2020-10-26 MED ORDER — METFORMIN HCL 1000 MG PO TABS: 1000.0000 mg | ORAL_TABLET | Freq: Two times a day (BID) | ORAL | 3 refills | Status: DC

## 2020-10-26 MED ORDER — CANDESARTAN CILEXETIL 16 MG PO TABS: 16.0000 mg | ORAL_TABLET | Freq: Two times a day (BID) | ORAL | 3 refills | Status: DC

## 2020-10-26 MED ORDER — INSULIN ASPART PROT & ASPART (70-30 MIX) 100 UNIT/ML ~~LOC~~ SUSP: SUBCUTANEOUS | 3 refills | Status: DC

## 2020-10-26 MED ORDER — ATORVASTATIN CALCIUM 80 MG PO TABS: ORAL_TABLET | ORAL | 3 refills | Status: DC

## 2020-10-26 NOTE — Progress Notes (Signed)
BP 110/65   Pulse (!) 52   Ht _0  (1.676 m)   Wt 182 lb (82.6 kg)   SpO2 100%   BMI 29.38 kg/m    Subjective:   Patient ID: Ethan Peters, male    DOB: July 01, 1956, 65 y.o.   MRN: [id number]  HPI: LEALAND ELTING is a 65 y.o. male presenting on 10/26/2020 for Medical Management of Chronic Issues and Diabetes   HPI Type 2 diabetes mellitus Patient comes in today for recheck of his diabetes. Patient has been currently taking metformin and glipizide and NovoLog 70/30 40 a.m. and 40 p.m. Patient is currently on an ACE inhibitor/ARB. Patient has not seen an ophthalmologist this year. Patient denies any issues with their feet. The symptom started onset as an adult hyperlipidemia and hypothyroidism ARE RELATED TO DM   Hypothyroidism recheck Patient is coming in for thyroid recheck today as well. They deny any issues with hair changes or heat or cold problems or diarrhea or constipation. They deny any chest pain or palpitations. They are currently on levothyroxine 50 micrograms   Hyperlipidemia Patient is coming in for recheck of his hyperlipidemia. The patient is currently taking atorvastatin. They deny any issues with myalgias or history of liver damage from it. They deny any focal numbness or weakness or chest pain.   Relevant past medical, surgical, family and social history reviewed and updated as indicated. Interim medical history since our last visit reviewed. Allergies and medications reviewed and updated.  Review of Systems  Constitutional: Negative for chills and fever.  Eyes: Negative for visual disturbance.  Respiratory: Negative for shortness of breath and wheezing.   Cardiovascular: Negative for chest pain and leg swelling.  Musculoskeletal: Negative for back pain and gait problem.  Skin: Negative for rash.  Neurological: Negative for dizziness, weakness, light-headedness and numbness.  All other systems reviewed and are negative.   Per HPI unless specifically  indicated above   Allergies as of 10/26/2020      Reactions   Levemir [insulin Detemir] Swelling   Lisinopril Cough   Trazodone And Nefazodone    Penicillins Rash   Lip swelling      Medication List       Accurate as of October 26, 2020  2:55 PM. If you have any questions, ask your nurse or doctor.        allopurinol 300 MG tablet Commonly known as: ZYLOPRIM TAKE ONE TABLET BY MOUTH ONCE DAILY AS NEEDED FOR GOUT prevention   Alphagan P 0.15 % ophthalmic solution Generic drug: brimonidine Place 1 drop into both eyes 2 (two) times daily.   aspirin 81 MG EC tablet Take 1 tablet (81 mg total) by mouth daily.   atorvastatin 80 MG tablet Commonly known as: LIPITOR TAKE 1 TABLET BY MOUTH ONCE DAILY AT 6 PM FOR CHOLESTEROL   B-complex with vitamin C tablet Take 1 tablet by mouth as needed (for vitamin deficiency).   candesartan 16 MG tablet Commonly known as: ATACAND Take 1 tablet (16 mg total) by mouth in the morning and at bedtime. What changed: when to take this Changed by: Worthy Rancher, MD   cholecalciferol 25 MCG (1000 UNIT) tablet Commonly known as: VITAMIN D3 Take 1,000 Units by mouth daily.   dorzolamide-timolol 22.3-6.8 MG/ML ophthalmic solution Commonly known as: COSOPT Place 1 drop into the right eye 2 (two) times daily.   glipiZIDE 5 MG tablet Commonly known as: GLUCOTROL Take 1 tablet (5 mg total) by mouth daily  before breakfast.   glucose blood test strip Use as instructed to test blood sugar up to 6 times daily. DX: E11.9   ibuprofen 800 MG tablet Commonly known as: ADVIL Take 1 tablet (800 mg total) by mouth every 8 (eight) hours as needed for moderate pain.   insulin aspart protamine- aspart (70-30) 100 UNIT/ML injection Commonly known as: NovoLOG Mix 70/30 44 units before breakfast and 40 units before supper daily   latanoprost 0.005 % ophthalmic solution Commonly known as: XALATAN Place 1 drop into both eyes at bedtime.    levothyroxine 50 MCG tablet Commonly known as: SYNTHROID Take 1 tablet (50 mcg total) by mouth daily.   metFORMIN 1000 MG tablet Commonly known as: GLUCOPHAGE Take 1 tablet (1,000 mg total) by mouth 2 (two) times daily with a meal.   nitroGLYCERIN 0.4 MG SL tablet Commonly known as: NITROSTAT Place 1 tablet (0.4 mg total) under the tongue every 5 (five) minutes x 3 doses as needed for chest pain.   Prodigy Voice Blood Glucose w/Device Kit Use to test blood sugar up to 6 times daily. DX:E11.9 Patient needs talking glucometer        Objective:   BP 110/65   Pulse (!) 52   Ht _0  (1.676 m)   Wt 182 lb (82.6 kg)   SpO2 100%   BMI 29.38 kg/m   Wt Readings from Last 3 Encounters:  10/26/20 182 lb (82.6 kg)  10/21/20 181 lb 12.8 oz (82.5 kg)  05/26/20 180 lb (81.6 kg)    Physical Exam Vitals and nursing note reviewed.  Constitutional:      General: He is not in acute distress.    Appearance: He is well-developed and well-nourished. He is not diaphoretic.  Eyes:     General: No scleral icterus.    Extraocular Movements: EOM normal.     Conjunctiva/sclera: Conjunctivae normal.  Neck:     Thyroid: No thyromegaly.  Cardiovascular:     Rate and Rhythm: Normal rate and regular rhythm.     Pulses: Intact distal pulses.     Heart sounds: Normal heart sounds. No murmur heard.   Pulmonary:     Effort: Pulmonary effort is normal. No respiratory distress.     Breath sounds: Normal breath sounds. No wheezing.  Musculoskeletal:        General: No edema. Normal range of motion.     Cervical back: Neck supple.  Lymphadenopathy:     Cervical: No cervical adenopathy.  Skin:    General: Skin is warm and dry.     Findings: No rash.  Neurological:     Mental Status: He is alert and oriented to person, place, and time.     Coordination: Coordination normal.  Psychiatric:        Mood and Affect: Mood and affect normal.        Behavior: Behavior normal.       Assessment &  Plan:   Problem List Items Addressed This Visit      Cardiovascular and Mediastinum   DM type 2 causing vascular disease (HCC)   Relevant Medications   atorvastatin (LIPITOR) 80 MG tablet   glipiZIDE (GLUCOTROL) 5 MG tablet   insulin aspart protamine- aspart (NOVOLOG MIX 70/30) (70-30) 100 UNIT/ML injection   candesartan (ATACAND) 16 MG tablet   metFORMIN (GLUCOPHAGE) 1000 MG tablet   Essential hypertension   Relevant Medications   atorvastatin (LIPITOR) 80 MG tablet   candesartan (ATACAND) 16 MG tablet   Other  Relevant Orders   CBC with Differential/Platelet   CMP14+EGFR   Lipid panel   Bayer DCA Hb A1c Waived (Completed)   PSA, total and free     Endocrine   Diabetic neuropathy (HCC)   Relevant Medications   atorvastatin (LIPITOR) 80 MG tablet   glipiZIDE (GLUCOTROL) 5 MG tablet   insulin aspart protamine- aspart (NOVOLOG MIX 70/30) (70-30) 100 UNIT/ML injection   candesartan (ATACAND) 16 MG tablet   metFORMIN (GLUCOPHAGE) 1000 MG tablet   Diabetes mellitus due to underlying condition with diabetic neuropathy (HCC)   Relevant Medications   atorvastatin (LIPITOR) 80 MG tablet   glipiZIDE (GLUCOTROL) 5 MG tablet   insulin aspart protamine- aspart (NOVOLOG MIX 70/30) (70-30) 100 UNIT/ML injection   candesartan (ATACAND) 16 MG tablet   metFORMIN (GLUCOPHAGE) 1000 MG tablet   Hypothyroidism   Relevant Medications   levothyroxine (SYNTHROID) 50 MCG tablet   Diabetic retinopathy of both eyes associated with type 2 diabetes mellitus (HCC)   Relevant Medications   atorvastatin (LIPITOR) 80 MG tablet   glipiZIDE (GLUCOTROL) 5 MG tablet   insulin aspart protamine- aspart (NOVOLOG MIX 70/30) (70-30) 100 UNIT/ML injection   candesartan (ATACAND) 16 MG tablet   metFORMIN (GLUCOPHAGE) 1000 MG tablet     Other   Mixed hyperlipidemia   Relevant Medications   atorvastatin (LIPITOR) 80 MG tablet   candesartan (ATACAND) 16 MG tablet   Other Relevant Orders   CBC with  Differential/Platelet   CMP14+EGFR   Lipid panel   Bayer DCA Hb A1c Waived (Completed)   PSA, total and free    Other Visit Diagnoses    Type 2 diabetes mellitus without complication, without long-term current use of insulin (HCC)    -  Primary   Relevant Medications   atorvastatin (LIPITOR) 80 MG tablet   glipiZIDE (GLUCOTROL) 5 MG tablet   insulin aspart protamine- aspart (NOVOLOG MIX 70/30) (70-30) 100 UNIT/ML injection   candesartan (ATACAND) 16 MG tablet   metFORMIN (GLUCOPHAGE) 1000 MG tablet   Other Relevant Orders   CBC with Differential/Platelet   CMP14+EGFR   Lipid panel   Bayer DCA Hb A1c Waived (Completed)   PSA, total and free   Prostate cancer screening       Relevant Orders   PSA, total and free   Colon cancer screening       Relevant Orders   Ambulatory referral to Gastroenterology      Has continued to see cardiology, seems to be doing well on his medicines, his blood pressure looks great today, he will follow-up with Dr. Martinique as well.  Refilled all of his medicines to make sure that he has enough. Follow up plan: Return in about 3 months (around 01/24/2021), or if symptoms worsen or fail to improve, for Diabetes and thyroid and hld.  Counseling provided for all of the vaccine components Orders Placed This Encounter  Procedures  . CBC with Differential/Platelet  . CMP14+EGFR  . Lipid panel  . Bayer DCA Hb A1c Waived  . PSA, total and free  . Ambulatory referral to Gastroenterology    Caryl Pina, MD [facility] 10/26/2020, 2:55 PM

## 2020-10-27 LAB — CBC WITH DIFFERENTIAL/PLATELET
Basophils Absolute: 0.1 10*3/uL (ref 0.0–0.2)
Basos: 1 %
EOS (ABSOLUTE): 0.2 10*3/uL (ref 0.0–0.4)
Eos: 3 %
Hematocrit: 38.5 % (ref 37.5–51.0)
Hemoglobin: 13 g/dL (ref 13.0–17.7)
Immature Grans (Abs): 0 10*3/uL (ref 0.0–0.1)
Immature Granulocytes: 0 %
Lymphocytes Absolute: 2.7 10*3/uL (ref 0.7–3.1)
Lymphs: 35 %
MCH: 31.4 pg (ref 26.6–33.0)
MCHC: 33.8 g/dL (ref 31.5–35.7)
MCV: 93 fL (ref 79–97)
Monocytes Absolute: 0.6 10*3/uL (ref 0.1–0.9)
Monocytes: 8 %
Neutrophils Absolute: 4.1 10*3/uL (ref 1.4–7.0)
Neutrophils: 53 %
Platelets: 203 10*3/uL (ref 150–450)
RBC: 4.14 x10E6/uL (ref 4.14–5.80)
RDW: 12.7 % (ref 11.6–15.4)
WBC: 7.7 10*3/uL (ref 3.4–10.8)

## 2020-10-27 LAB — LIPID PANEL
Chol/HDL Ratio: 2.7 ratio (ref 0.0–5.0)
Cholesterol, Total: 106 mg/dL (ref 100–199)
HDL: 40 mg/dL (ref >39)
LDL Chol Calc (NIH): 50 mg/dL (ref 0–99)
Triglycerides: 79 mg/dL (ref 0–149)
VLDL Cholesterol Cal: 16 mg/dL (ref 5–40)

## 2020-10-27 LAB — CMP14+EGFR
ALT: 13 IU/L (ref 0–44)
AST: 16 IU/L (ref 0–40)
Albumin/Globulin Ratio: 1.7 (ref 1.2–2.2)
Albumin: 4.8 g/dL (ref 3.8–4.8)
Alkaline Phosphatase: 88 IU/L (ref 44–121)
BUN/Creatinine Ratio: 31 — ABNORMAL HIGH (ref 10–24)
BUN: 29 mg/dL — ABNORMAL HIGH (ref 8–27)
Bilirubin Total: 0.4 mg/dL (ref 0.0–1.2)
CO2: 21 mmol/L (ref 20–29)
Calcium: 9.9 mg/dL (ref 8.6–10.2)
Chloride: 105 mmol/L (ref 96–106)
Creatinine, Ser: 0.95 mg/dL (ref 0.76–1.27)
GFR calc Af Amer: 97 mL/min/{1.73_m2} (ref >59)
GFR calc non Af Amer: 84 mL/min/{1.73_m2} (ref >59)
Globulin, Total: 2.9 g/dL (ref 1.5–4.5)
Glucose: 59 mg/dL — ABNORMAL LOW (ref 65–99)
Potassium: 4.5 mmol/L (ref 3.5–5.2)
Sodium: 144 mmol/L (ref 134–144)
Total Protein: 7.7 g/dL (ref 6.0–8.5)

## 2020-10-27 LAB — PSA, TOTAL AND FREE
PSA, Free Pct: 31.4 %
PSA, Free: 0.22 ng/mL
Prostate Specific Ag, Serum: 0.7 ng/mL (ref 0.0–4.0)

## 2020-11-03 ENCOUNTER — Ambulatory Visit (HOSPITAL_COMMUNITY)
Admission: RE | Admit: 2020-11-03 | Discharge: 2020-11-03 | Disposition: A | Discharge status: Home / Self Care | Payer: PPO | Source: Ambulatory Visit | Attending: Cardiology | Admitting: Cardiology

## 2020-11-03 ENCOUNTER — Other Ambulatory Visit: Payer: Self-pay

## 2020-11-03 DIAGNOSIS — I25118 Atherosclerotic heart disease of native coronary artery with other forms of angina pectoris: Secondary | ICD-10-CM

## 2020-11-03 LAB — MYOCARDIAL PERFUSION IMAGING
LV dias vol: 99 mL (ref 62–150)
LV sys vol: 46 mL
Peak HR: 91 {beats}/min
Rest HR: 61 {beats}/min
SDS: 2
SRS: 10
SSS: 12
TID: 1.06

## 2020-11-03 MED ORDER — TECHNETIUM TC 99M TETROFOSMIN IV KIT
9.8000 | PACK | Freq: Once | INTRAVENOUS | Status: AC | PRN
  Administered 2020-11-03: 9.8 via INTRAVENOUS
  Filled 2020-11-03: qty 10

## 2020-11-03 MED ORDER — TECHNETIUM TC 99M TETROFOSMIN IV KIT
31.9000 | PACK | Freq: Once | INTRAVENOUS | Status: AC | PRN
  Administered 2020-11-03: 31.9 via INTRAVENOUS
  Filled 2020-11-03: qty 32

## 2020-11-03 MED ORDER — REGADENOSON 0.4 MG/5ML IV SOLN
0.4000 mg | Freq: Once | INTRAVENOUS | Status: AC
  Administered 2020-11-03: 0.4 mg via INTRAVENOUS

## 2020-11-23 ENCOUNTER — Ambulatory Visit (INDEPENDENT_AMBULATORY_CARE_PROVIDER_SITE_OTHER): Payer: PPO

## 2020-11-23 DIAGNOSIS — Z0000 Encounter for general adult medical examination without abnormal findings: Secondary | ICD-10-CM

## 2020-11-23 NOTE — Progress Notes (Signed)
MEDICARE ANNUAL WELLNESS VISIT  11/23/2020  Telephone Visit Disclaimer This Medicare AWV was conducted by telephone due to national recommendations for restrictions regarding the COVID-19 Pandemic (e.g. social distancing).  I verified, using two identifiers, that I am speaking with Ethan Peters or their authorized healthcare agent. I discussed the limitations, risks, security, and privacy concerns of performing an evaluation and management service by telephone and the potential availability of an in-person appointment in the future. The patient expressed understanding and agreed to proceed.  Location of Patient: Home Location of Provider (nurse):  [facility]  Subjective:    Ethan Peters is a 65 y.o. male patient of Ethan Peters, Ethan Kaufmann, MD who had a Medicare Annual Wellness Visit today via telephone. Ethan Peters. He is disabled now due to his eyesight failing but worked for years as a Corporate treasurer. His english is not the best so his Peters helped interpret the visit. He and his wife only have 1 child. He stays very active eventhough his eyesight is a struggle. He states that he has had all 3 Covid vaccines but did not have the dates readily available for me to place in the computer.   Patient Care Team: Ethan Peters, Ethan Kaufmann, MD as PCP - General (Family Medicine) Peters, Ethan M, MD as PCP - Cardiology (Cardiology) Ethan Anger, MD as Consulting Physician (Endocrinology) Ethan Stalls, MD as Consulting Physician (Ophthalmology)  Advanced Directives 11/23/2020 11/20/2019 11/18/2018 11/18/2018 01/21/2013 01/10/2013  Does Patient Have a Medical Advance Directive? No No - No Patient does not have advance directive Patient does not have advance directive  Would patient like information on creating a medical advance directive? No - Patient declined No - Patient declined Yes (MAU/Ambulatory/Procedural Areas - Information  given) No - Patient declined - -    Hospital Utilization Over the Past 12 Months: # of hospitalizations or ER visits: 0 # of surgeries: 0  Review of Systems    Patient reports that his overall health is worse compared to last year.  History obtained from chart review  Patient Reported Readings (BP, Pulse, CBG, Weight, etc) none  Pain Assessment Pain : No/denies pain     Current Medications & Allergies (verified) Allergies as of 11/23/2020      Reactions   Levemir [insulin Detemir] Swelling   Lisinopril Cough   Trazodone And Nefazodone    Penicillins Rash   Lip swelling      Medication List       Accurate as of November 23, 2020  1:33 PM. If you have any questions, ask your nurse or doctor.        allopurinol 300 MG tablet Commonly known as: ZYLOPRIM TAKE ONE TABLET BY MOUTH ONCE DAILY AS NEEDED FOR GOUT prevention   Alphagan P 0.15 % ophthalmic solution Generic drug: brimonidine Place 1 drop into both eyes 2 (two) times daily.   aspirin 81 MG EC tablet Take 1 tablet (81 mg total) by mouth daily.   atorvastatin 80 MG tablet Commonly known as: LIPITOR TAKE 1 TABLET BY MOUTH ONCE DAILY AT 6 PM FOR CHOLESTEROL   B-complex with vitamin C tablet Take 1 tablet by mouth as needed (for vitamin deficiency).   candesartan 16 MG tablet Commonly known as: ATACAND Take 1 tablet (16 mg total) by mouth in the morning and at bedtime.   cholecalciferol 25 MCG (1000 UNIT) tablet Commonly known as: VITAMIN D3 Take 1,000 Units  by mouth daily.   dorzolamide-timolol 22.3-6.8 MG/ML ophthalmic solution Commonly known as: COSOPT Place 1 drop into the right eye 2 (two) times daily.   glipiZIDE 5 MG tablet Commonly known as: GLUCOTROL Take 1 tablet (5 mg total) by mouth daily before breakfast.   glucose blood test strip Use as instructed to test blood sugar up to 6 times daily. DX: E11.9   ibuprofen 800 MG tablet Commonly known as: ADVIL Take 1 tablet (800 mg total) by  mouth every 8 (eight) hours as needed for moderate pain.   insulin aspart protamine- aspart (70-30) 100 UNIT/ML injection Commonly known as: NovoLOG Mix 70/30 44 units before breakfast and 40 units before supper daily   latanoprost 0.005 % ophthalmic solution Commonly known as: XALATAN Place 1 drop into both eyes at bedtime.   levothyroxine 50 MCG tablet Commonly known as: SYNTHROID Take 1 tablet (50 mcg total) by mouth daily.   metFORMIN 1000 MG tablet Commonly known as: GLUCOPHAGE Take 1 tablet (1,000 mg total) by mouth 2 (two) times daily with a meal.   nitroGLYCERIN 0.4 MG SL tablet Commonly known as: NITROSTAT Place 1 tablet (0.4 mg total) under the tongue every 5 (five) minutes x 3 doses as needed for chest pain.   Prodigy Voice Blood Glucose w/Device Kit Use to test blood sugar up to 6 times daily. DX:E11.9 Patient needs talking glucometer       History (reviewed): Past Medical History:  Diagnosis Date  . CAD (coronary artery disease) 01/09/13   Inferior STEMI s/p DES-RCA  . Diabetes mellitus    Uncontrolled, Hgb A1C 14.8% on 03/14, started on insulin  . Gout   . Hypertension   . Ischemic cardiomyopathy    EF 16-57%, grade 1 diastolic dysfunction, mildly dilated RV, RA at the upper limits of normal, mild TR, PA systolic pressure 32 mm mercury and hypokinesis to akinesis of the basal mid inferior myocardium  . Myocardial infarction ([place]) 01/09/2013  . Shortness of breath    Past Surgical History:  Procedure Laterality Date  . CARDIAC CATHETERIZATION  01/22/2013   Diffuse borderline residual CAD consistent with uncontrolled diabetes, medical management recommended  . CORONARY ANGIOPLASTY WITH STENT PLACEMENT  01/09/2013   30% pLAD, 30% mLAD, 70% pLCx, RCA occlusion at crux with R->L distal collaterals s/p DES; LVEF 50%, moderate-severe HK of inferior basal wall  . EYE SURGERY Bilateral   . LEFT HEART CATHETERIZATION WITH CORONARY ANGIOGRAM N/A 01/09/2013    Procedure: LEFT HEART CATHETERIZATION WITH CORONARY ANGIOGRAM;  Surgeon: Ethan Peters Martinique, MD;  Location[facility] CATH LAB;  Service: Cardiovascular;  Laterality: N/A;  . LEFT HEART CATHETERIZATION WITH CORONARY ANGIOGRAM N/A 01/22/2013   Procedure: LEFT HEART CATHETERIZATION WITH CORONARY ANGIOGRAM;  Surgeon: Ethan Mocha, MD;  Location: [facility] CATH LAB;  Service: Cardiovascular;  Laterality: N/A;   Family History  Problem Relation Age of Onset  . Heart disease Mother   . Heart disease Father   . Heart disease Brother   . Heart disease Brother    Social History   Socioeconomic History  . Marital status: Married    Spouse name: Ethan Peters  . Number of children: 1  . Years of education: 71  . Highest education level: Associate degree: occupational, Hotel manager, or vocational program  Occupational History  . Occupation: disabled    Comment: Furniture conservator/restorer   Tobacco Use  . Smoking status: Never Smoker  . Smokeless tobacco: Never Used  Vaping Use  . Vaping Use: Never used  Substance  and Sexual Activity  . Alcohol use: Yes    Comment: 4 times per year-wine or beer  . Drug use: No  . Sexual activity: Yes    Birth control/protection: None  Other Topics Concern  . Not on file  Social History Narrative   Spanish-speaking. Limited English.    Social Determinants of Health   Financial Resource Strain: Not on file  Food Insecurity: Not on file  Transportation Needs: Not on file  Physical Activity: Not on file  Stress: Not on file  Social Connections: Not on file    Activities of Daily Living In your present state of health, do you have any difficulty performing the following activities: 11/23/2020  Hearing? N  Vision? N  Difficulty concentrating or making decisions? N  Walking or climbing stairs? N  Dressing or bathing? N  Doing errands, shopping? N  Some recent data might be hidden    Patient Education/ Literacy How often do you need to have someone help you when you read  instructions, pamphlets, or other written materials from your doctor or pharmacy?: 1 - Never What is the last grade level you completed in school?: College graduate  Exercise    Diet Patient reports consuming 3 meals a day and 2 snack(s) a day Patient reports that his primary diet is: Diabetic Patient reports that she does have regular access to food.   Depression Screen PHQ 2/9 Scores 11/23/2020 10/26/2020 05/26/2020 04/28/2020 01/28/2020 11/20/2019 11/18/2018  PHQ - 2 Score 0 0 0 0 0 0 1  PHQ- 9 Score - - - - - - -  Exception Documentation - - - - - - -     Fall Risk Fall Risk  11/23/2020 10/26/2020 05/26/2020 04/28/2020 01/28/2020  Falls in the past year? 0 0 0 0 0  Number falls in past yr: - - - - -  Follow up - - - Falls evaluation completed -     Objective:  Ethan Peters seemed alert and oriented and he participated appropriately during our telephone visit.  Blood Pressure Weight BMI  BP Readings from Last 3 Encounters:  10/26/20 110/65  10/21/20 120/70  05/26/20 102/64   Wt Readings from Last 3 Encounters:  11/03/20 181 lb (82.1 kg)  10/26/20 182 lb (82.6 kg)  10/21/20 181 lb 12.8 oz (82.5 kg)   BMI Readings from Last 1 Encounters:  11/03/20 29.21 kg/Peters    *Unable to obtain current vital signs, weight, and BMI due to telephone visit type  Hearing/Vision  . Ethan Peters did not seem to have difficulty with hearing/understanding during the telephone conversation . Reports that he has had a formal eye exam by an eye care professional within the past year . Reports that he has had a formal hearing evaluation within the past year *Unable to fully assess hearing and vision during telephone visit type  Cognitive Function: 6CIT Screen 11/23/2020 11/20/2019  What Year? 0 points 0 points  What month? 0 points 0 points  What time? 0 points 0 points  Count back from 20 0 points 0 points  Months in reverse 0 points 0 points  Repeat phrase 0 points 2 points  Total Score 0 2    (Normal:0-7, Significant for Dysfunction: >8)  Normal Cognitive Function Screening: Yes   Immunization & Health Maintenance Record Immunization History  Administered Date(s) Administered  . Fluad Quad(high Dose 65+) 08/30/2020  . Influenza Inj Mdck Quad With Preservative 07/28/2019  . Influenza,inj,Quad PF,6+ Mos 08/22/2015, 07/27/2016, 07/16/2017, 08/13/2018  .  Pneumococcal Conjugate-13 11/27/2016  . Pneumococcal Polysaccharide-23 09/25/2007, 08/13/2018, 09/30/2019, 08/30/2020  . Tdap 03/31/2012    Health Maintenance  Topic Date Due  . COVID-19 Vaccine (1) 01/23/2021 (Originally 08/24/1961)  . COLONOSCOPY (Pts 45-16yr Insurance coverage will need to be confirmed)  04/24/2021 (Originally 08/24/2000)  . FOOT EXAM  01/27/2021  . OPHTHALMOLOGY EXAM  04/19/2021  . HEMOGLOBIN A1C  04/25/2021  . TETANUS/TDAP  03/31/2022  . INFLUENZA VACCINE  Completed  . Hepatitis C Screening  Completed  . HIV Screening  Completed  . PNA vac Low Risk Adult  Completed       Assessment  This is a routine wellness examination for Ethan Peters  Health Maintenance: Due or Overdue There are no preventive care reminders to display for this patient.  EMarshell Levandoes not need a referral for Community Assistance: Care Management:   no Social Work:    no Prescription Assistance:  no Nutrition/Diabetes Education:  no   Plan:  Personalized Goals Goals Addressed            This Visit's Progress   . DIET - EAT MORE FRUITS AND VEGETABLES        Personalized Health Maintenance & Screening Recommendations    Lung Cancer Screening Recommended: no (Low Dose CT Chest recommended if Age 66-80years, 30 pack-year currently smoking OR have quit w/in past 15 years) Hepatitis C Screening recommended: Done HIV Screening recommended: no  Advanced Directives: Written information was not prepared per patient's request.  Referrals & Orders No orders of the defined types were placed in this  encounter.   Follow-up Plan . Follow-up with Ethan Peters, JFransisca Kaufmann MD as planned    I have personally reviewed and noted the following in the patient's chart:   . Medical and social history . Use of alcohol, tobacco or illicit drugs  . Current medications and supplements . Functional ability and status . Nutritional status . Physical activity . Advanced directives . List of other physicians . Hospitalizations, surgeries, and ER visits in previous 12 months . Vitals . Screenings to include cognitive, depression, and falls . Referrals and appointments  In addition, I have reviewed and discussed with EMarshell Levancertain preventive protocols, quality metrics, and best practice recommendations. A written personalized care plan for preventive services as well as general preventive health recommendations is available and can be mailed to the patient at his request.      ARolena InfanteLPN 29/12/8099

## 2020-11-28 NOTE — Patient Instructions (Signed)
  [name] , Thank you for taking time to come for your Medicare Wellness Visit. I appreciate your ongoing commitment to your health goals. Please review the following plan we discussed and let me know if I can assist you in the future.   These are the goals we discussed: Goals    .  DIET - EAT MORE FRUITS AND VEGETABLES    .  DIET - INCREASE WATER INTAKE      Try to drink 6-8 glasses of water daily    .  Patient Stated (pt-stated)      Continue healthy diet and exercise regimen.        This is a list of the screening recommended for you and due dates:  Health Maintenance  Topic Date Due  . COVID-19 Vaccine (1) 01/23/2021*  . Colon Cancer Screening  04/24/2021*  . Complete foot exam   01/27/2021  . Eye exam for diabetics  04/19/2021  . Hemoglobin A1C  04/25/2021  . Tetanus Vaccine  03/31/2022  . Flu Shot  Completed  .  Hepatitis C: One time screening is recommended by Center for Disease Control  (CDC) for  adults born from [birth year] through 1966.   Completed  . HIV Screening  Completed  . Pneumonia vaccines  Completed  *Topic was postponed. The date shown is not the original due date.

## 2020-11-29 ENCOUNTER — Telehealth: Payer: Self-pay

## 2020-11-29 NOTE — Telephone Encounter (Signed)
REFERRAL REQUEST Telephone Note  Have you been seen at our office for this problem? yes (Advise that they may need an appointment with their PCP before a referral can be done)  Reason for Referral: diabetic & needs his toenails cut Referral discussed with patient: no Best contact number of patient for referral team:    Has patient been seen by a specialist for this issue before: no, but he is blind now Patient provider preference for referral: Dr Ulice Brilliant Patient location preference for referral: [facility]   Patient notified that referrals can take up to a week or longer to process. If they haven't heard anything within a week they should call back and speak with the referral department.   [facility] pt.  Drake's office is not open with no car or leaving message and no one return calls.  Did he move? Or someone else working there now?  Please call.

## 2020-12-06 DIAGNOSIS — N5201 Erectile dysfunction due to arterial insufficiency: Secondary | ICD-10-CM | POA: Diagnosis not present

## 2020-12-26 ENCOUNTER — Encounter: Payer: Self-pay | Admitting: Family Medicine

## 2020-12-26 ENCOUNTER — Other Ambulatory Visit: Payer: Self-pay

## 2020-12-26 ENCOUNTER — Ambulatory Visit (INDEPENDENT_AMBULATORY_CARE_PROVIDER_SITE_OTHER): Payer: PPO | Admitting: Family Medicine

## 2020-12-26 VITALS — BP 137/70 | HR 57 | Ht 66.0 in | Wt 178.0 lb

## 2020-12-26 DIAGNOSIS — B351 Tinea unguium: Secondary | ICD-10-CM | POA: Diagnosis not present

## 2020-12-26 DIAGNOSIS — S91302A Unspecified open wound, left foot, initial encounter: Secondary | ICD-10-CM | POA: Diagnosis not present

## 2020-12-26 NOTE — Progress Notes (Signed)
BP 137/70   Pulse (!) 57   Ht '5\' 6"'  (1.676 m)   Wt 178 lb (80.7 kg)   SpO2 99%   BMI 28.73 kg/m    Subjective:   Patient ID: Ethan Peters, male    DOB: [birth year], 65 y.o.   MRN: [id number]  HPI: Ethan Peters is a 65 y.o. male presenting on 12/26/2020 for Leg Injury (Left, scabbing. Scratched and made worse)   HPI Patient has a foot wound has been there slightly more than a week and on the inside of his left ankle and he says he felt like there was something under the skin it was boggy and loose and scratched at it and the skin came off and it bled but no pus came out.  Since then he has been putting iodine and alcohol on it every day and it does appear to be scabbing over but is not healing and so is coming in today to have it looked at.  Patient denies any fevers or chills or redness or warmth.  Patient also has thickened and yellowed toenails and has difficulty cutting them and wants a referral to podiatry.  Relevant past medical, surgical, family and social history reviewed and updated as indicated. Interim medical history since our last visit reviewed. Allergies and medications reviewed and updated.  Review of Systems  Constitutional: Negative for chills and fever.  Respiratory: Negative for shortness of breath and wheezing.   Cardiovascular: Negative for chest pain and leg swelling.  Musculoskeletal: Negative for back pain and gait problem.  Skin: Positive for color change and wound. Negative for rash.  All other systems reviewed and are negative.   Per HPI unless specifically indicated above   Allergies as of 12/26/2020      Reactions   Levemir [insulin Detemir] Swelling   Lisinopril Cough   Trazodone And Nefazodone    Penicillins Rash   Lip swelling      Medication List       Accurate as of December 26, 2020  9:21 AM. If you have any questions, ask your nurse or doctor.        allopurinol 300 MG tablet Commonly known as: ZYLOPRIM TAKE ONE TABLET BY  MOUTH ONCE DAILY AS NEEDED FOR GOUT prevention   Alphagan P 0.15 % ophthalmic solution Generic drug: brimonidine Place 1 drop into both eyes 2 (two) times daily.   aspirin 81 MG EC tablet Take 1 tablet (81 mg total) by mouth daily.   atorvastatin 80 MG tablet Commonly known as: LIPITOR TAKE 1 TABLET BY MOUTH ONCE DAILY AT 6 PM FOR CHOLESTEROL   B-complex with vitamin C tablet Take 1 tablet by mouth as needed (for vitamin deficiency).   candesartan 16 MG tablet Commonly known as: ATACAND Take 1 tablet (16 mg total) by mouth in the morning and at bedtime.   cholecalciferol 25 MCG (1000 UNIT) tablet Commonly known as: VITAMIN D3 Take 1,000 Units by mouth daily.   dorzolamide-timolol 22.3-6.8 MG/ML ophthalmic solution Commonly known as: COSOPT Place 1 drop into the right eye 2 (two) times daily.   glipiZIDE 5 MG tablet Commonly known as: GLUCOTROL Take 1 tablet (5 mg total) by mouth daily before breakfast.   glucose blood test strip Use as instructed to test blood sugar up to 6 times daily. DX: E11.9   ibuprofen 800 MG tablet Commonly known as: ADVIL Take 1 tablet (800 mg total) by mouth every 8 (eight) hours as needed for moderate pain.  insulin aspart protamine- aspart (70-30) 100 UNIT/ML injection Commonly known as: NovoLOG Mix 70/30 44 units before breakfast and 40 units before supper daily   latanoprost 0.005 % ophthalmic solution Commonly known as: XALATAN Place 1 drop into both eyes at bedtime.   levothyroxine 50 MCG tablet Commonly known as: SYNTHROID Take 1 tablet (50 mcg total) by mouth daily.   metFORMIN 1000 MG tablet Commonly known as: GLUCOPHAGE Take 1 tablet (1,000 mg total) by mouth 2 (two) times daily with a meal.   nitroGLYCERIN 0.4 MG SL tablet Commonly known as: NITROSTAT Place 1 tablet (0.4 mg total) under the tongue every 5 (five) minutes x 3 doses as needed for chest pain.   Prodigy Voice Blood Glucose w/Device Kit Use to test blood  sugar up to 6 times daily. DX:E11.9 Patient needs talking glucometer        Objective:   BP 137/70   Pulse (!) 57   Ht '5\' 6"'  (1.676 m)   Wt 178 lb (80.7 kg)   SpO2 99%   BMI 28.73 kg/m   Wt Readings from Last 3 Encounters:  12/26/20 178 lb (80.7 kg)  11/03/20 181 lb (82.1 kg)  10/26/20 182 lb (82.6 kg)    Physical Exam Vitals and nursing note reviewed.  Constitutional:      Appearance: Normal appearance.  Skin:    Findings: Wound present.          Comments: Thickened and yellowed toenails  Neurological:     Mental Status: He is alert.       Assessment & Plan:   Problem List Items Addressed This Visit   None   Visit Diagnoses    Wound of left foot    -  Primary   Onychomycosis       Relevant Orders   Ambulatory referral to Podiatry      Recommended topical Vaseline twice daily and triple antibiotics twice daily, stop using alcohol and iodine on it. Follow up plan: Return if symptoms worsen or fail to improve, for Follow-up and usual diabetic visit in 1 month.  If anything worsens on wound please let me know..  Counseling provided for all of the vaccine components Orders Placed This Encounter  Procedures  . Ambulatory referral to [place] Najee Cowens, MD [facility] 12/26/2020, 9:21 AM

## 2020-12-29 ENCOUNTER — Other Ambulatory Visit: Payer: Self-pay

## 2020-12-29 ENCOUNTER — Ambulatory Visit (AMBULATORY_SURGERY_CENTER): Payer: Self-pay | Admitting: *Deleted

## 2020-12-29 VITALS — Ht 66.0 in | Wt 184.0 lb

## 2020-12-29 DIAGNOSIS — Z1211 Encounter for screening for malignant neoplasm of colon: Secondary | ICD-10-CM

## 2020-12-29 MED ORDER — PEG 3350-KCL-NA BICARB-NACL 420 G PO SOLR: 4000.0000 mL | Freq: Once | ORAL | 0 refills | Status: AC

## 2020-12-29 NOTE — Progress Notes (Signed)
No egg or soy allergy known to patient  No issues with past sedation with any surgeries or procedures Patient denies ever being told they had issues or difficulty with intubation  No FH of Malignant Hyperthermia No diet pills per patient No home 02 use per patient  No blood thinners per patient  Pt denies issues with constipation  No A fib or A flutter  EMMI video to pt or via MyChart  COVID 19 guidelines implemented in [place] today with Pt and RN  Pt is fully vaccinated  for Covid   NO PA's for preps discussed with pt In PV today  Discussed with pt there will be an out-of-pocket cost for prep and that varies from $0 to 70 dollars    Pt is legally blind.  Accompanied by his son.    Due to the COVID-19 pandemic we are asking patients to follow certain guidelines.  Pt aware of COVID protocols and [facility] guidelines

## 2021-01-02 DIAGNOSIS — H4053X3 Glaucoma secondary to other eye disorders, bilateral, severe stage: Secondary | ICD-10-CM | POA: Diagnosis not present

## 2021-01-02 DIAGNOSIS — Z7984 Long term (current) use of oral hypoglycemic drugs: Secondary | ICD-10-CM | POA: Diagnosis not present

## 2021-01-02 DIAGNOSIS — H40043 Steroid responder, bilateral: Secondary | ICD-10-CM | POA: Diagnosis not present

## 2021-01-02 DIAGNOSIS — Z79899 Other long term (current) drug therapy: Secondary | ICD-10-CM | POA: Diagnosis not present

## 2021-01-02 DIAGNOSIS — E113593 Type 2 diabetes mellitus with proliferative diabetic retinopathy without macular edema, bilateral: Secondary | ICD-10-CM | POA: Diagnosis not present

## 2021-01-04 DIAGNOSIS — Z9841 Cataract extraction status, right eye: Secondary | ICD-10-CM | POA: Diagnosis not present

## 2021-01-04 DIAGNOSIS — H35052 Retinal neovascularization, unspecified, left eye: Secondary | ICD-10-CM | POA: Diagnosis not present

## 2021-01-04 DIAGNOSIS — H409 Unspecified glaucoma: Secondary | ICD-10-CM | POA: Diagnosis not present

## 2021-01-04 DIAGNOSIS — Z9842 Cataract extraction status, left eye: Secondary | ICD-10-CM | POA: Diagnosis not present

## 2021-01-04 DIAGNOSIS — E113553 Type 2 diabetes mellitus with stable proliferative diabetic retinopathy, bilateral: Secondary | ICD-10-CM | POA: Diagnosis not present

## 2021-01-04 DIAGNOSIS — Z7984 Long term (current) use of oral hypoglycemic drugs: Secondary | ICD-10-CM | POA: Diagnosis not present

## 2021-01-04 DIAGNOSIS — Z961 Presence of intraocular lens: Secondary | ICD-10-CM | POA: Diagnosis not present

## 2021-01-11 ENCOUNTER — Other Ambulatory Visit: Payer: Self-pay | Admitting: Family

## 2021-01-11 DIAGNOSIS — J810 Acute pulmonary edema: Secondary | ICD-10-CM

## 2021-01-12 DIAGNOSIS — M79673 Pain in unspecified foot: Secondary | ICD-10-CM | POA: Diagnosis not present

## 2021-01-12 DIAGNOSIS — B353 Tinea pedis: Secondary | ICD-10-CM | POA: Diagnosis not present

## 2021-01-13 ENCOUNTER — Encounter: Payer: PPO | Admitting: Gastroenterology

## 2021-01-13 IMAGING — MR MR HEAD WO/W CM
14 of 16 series · 42 of 48 positions shown · IV contrast (gadavist)
Comparison: Head CT report 03/31/2012.  CT images 3 [DATE].

CLINICAL DATA: Subacute cortical blindness.

EXAM:
MRI HEAD WITHOUT AND WITH CONTRAST
TECHNIQUE: Multiplanar, multiecho pulse sequences of the brain and surrounding
structures were obtained without and with intravenous contrast.
CONTRAST:  7.5mL GADAVIST GADOBUTROL 1 MMOL/ML IV SOLN

[Series 5: DWI · axial · 3.0mm · 0.88mm/px · z∈[-110,+28]mm · 8 of 100 slices shown (1 of 4)]
[im 1/100]
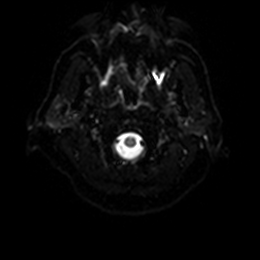
[im 15/100]
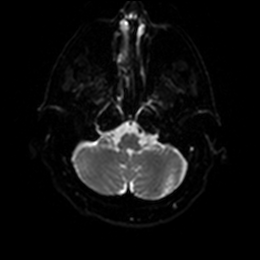
[im 29/100]
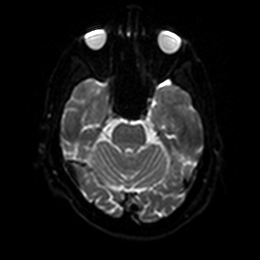
[im 43/100]
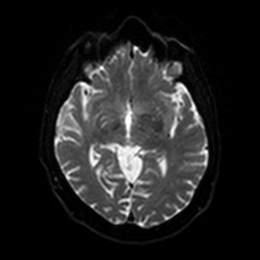
[im 57/100]
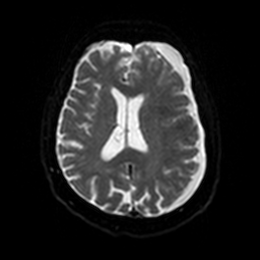
[im 71/100]
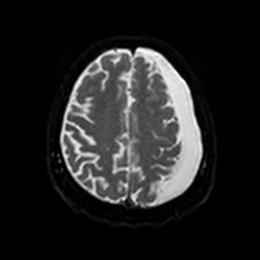
[im 85/100]
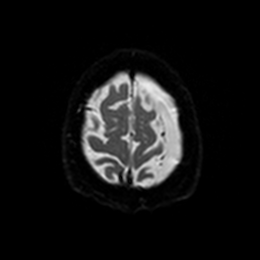
[im 100/100]
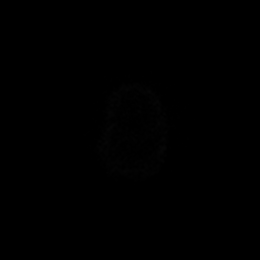

[Series 6: DWI · axial · 3.0mm · 0.88mm/px · z∈[-110,+28]mm · 3 of 50 slices shown (2 of 4)]
[im 1/50]
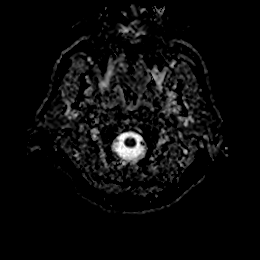
[im 25/50]
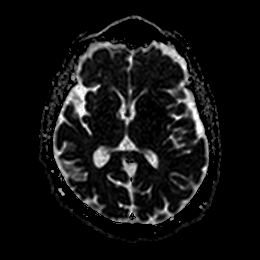
[im 50/50]
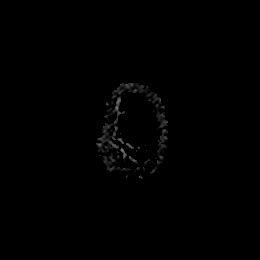

[Series 7: DWI · coronal · 4.0mm · 0.88mm/px · 5 of 70 slices shown (3 of 4)]
[im 1/70]
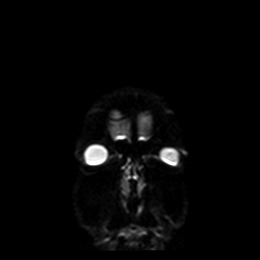
[im 18/70]
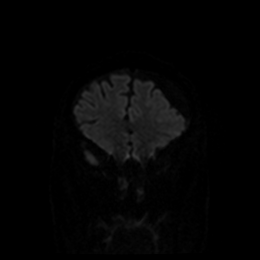
[im 35/70]
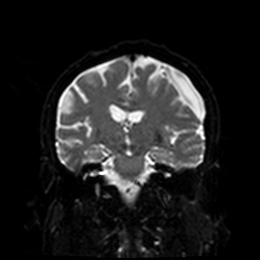
[im 52/70]
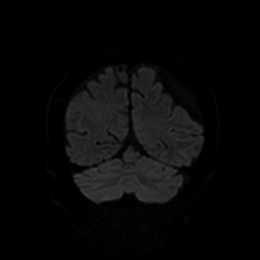
[im 70/70]
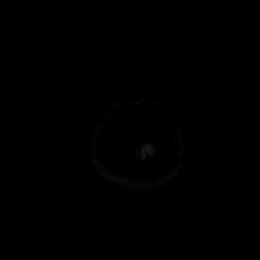

[Series 8: DWI · coronal · 4.0mm · 0.88mm/px · 2 of 35 slices shown (4 of 4)]
[im 1/35]
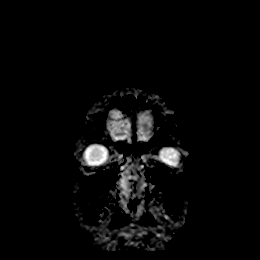
[im 35/35]
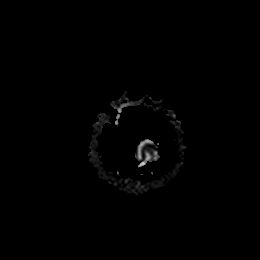

[Series 9: T1 · sagittal · 5.0mm · 0.75mm/px · 2 of 23 slices shown]
[im 1/23]
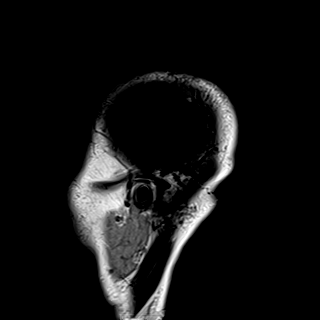
[im 23/23]
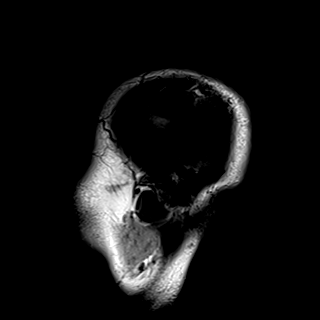

[Series 10: T2 · axial · 5.0mm · 0.72mm/px · z∈[-109,+26]mm · 2 of 25 slices shown]
[im 1/25]
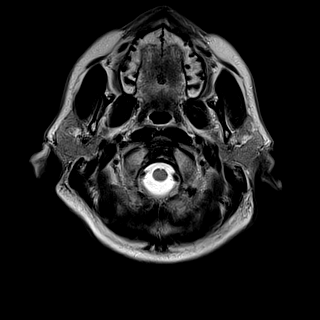
[im 25/25]
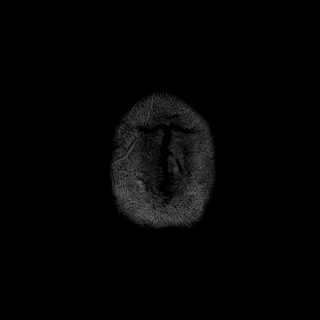

[Series 11: FLAIR · axial · 5.0mm · 0.45mm/px · z∈[-109,+26]mm · 2 of 25 slices shown]
[im 1/25]
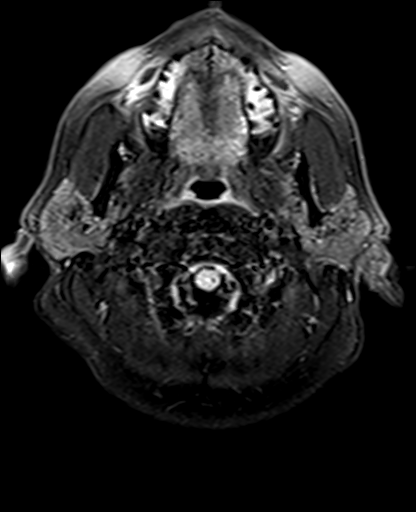
[im 25/25]
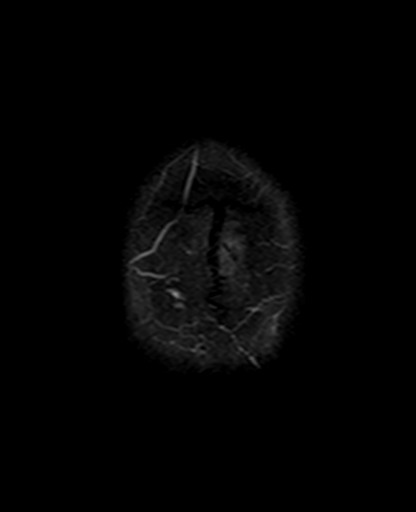

[Series 12: mag_images · axial · 3.0mm · 0.90mm/px · z∈[-110,+33]mm · 3 of 52 slices shown]
[im 1/52]
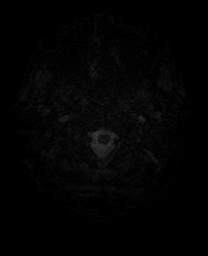
[im 26/52]
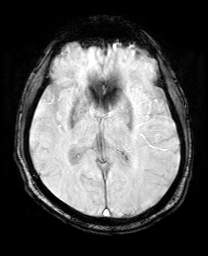
[im 52/52]
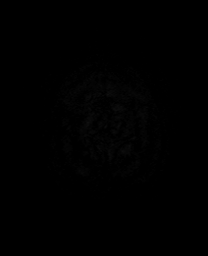

[Series 13: pha_images · axial · 3.0mm · 0.90mm/px · z∈[-110,+31]mm · 3 of 51 slices shown]
[im 1/51]
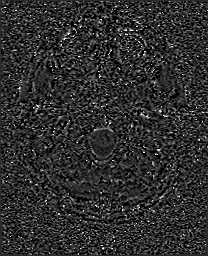
[im 26/51]
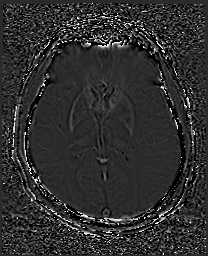
[im 51/51]
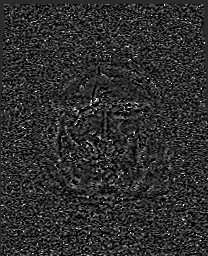

[Series 14: swi_images · axial · 3.0mm · 0.90mm/px · z∈[-110,+33]mm · 3 of 52 slices shown]
[im 1/52]
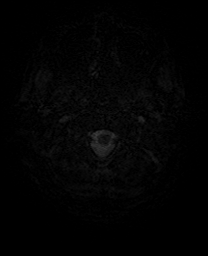
[im 26/52]
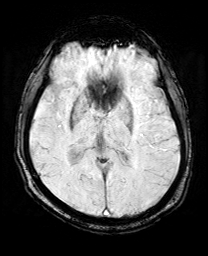
[im 52/52]
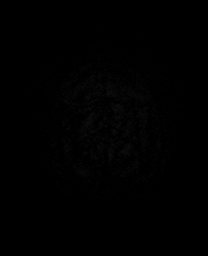

[Series 15: mip_images(sw) · axial · 24.0mm · 0.90mm/px · z∈[-100,+24]mm · 3 of 45 slices shown]
[im 1/45]
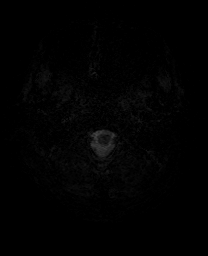
[im 23/45]
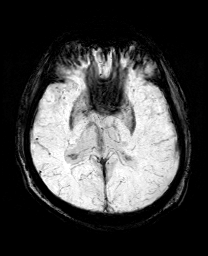
[im 45/45]
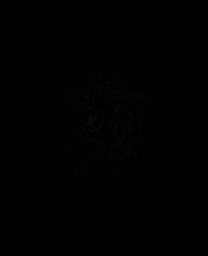

[Series 17: T2 post-contrast · coronal · 5.0mm · 0.72mm/px · 2 of 33 slices shown]
[im 1/33]
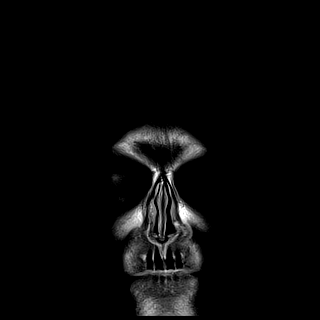
[im 33/33]
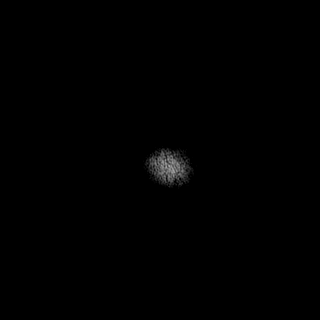

[Series 19: T1 post-contrast · coronal · 5.0mm · 0.34mm/px · 2 of 33 slices shown (1 of 2)]
[im 1/33]
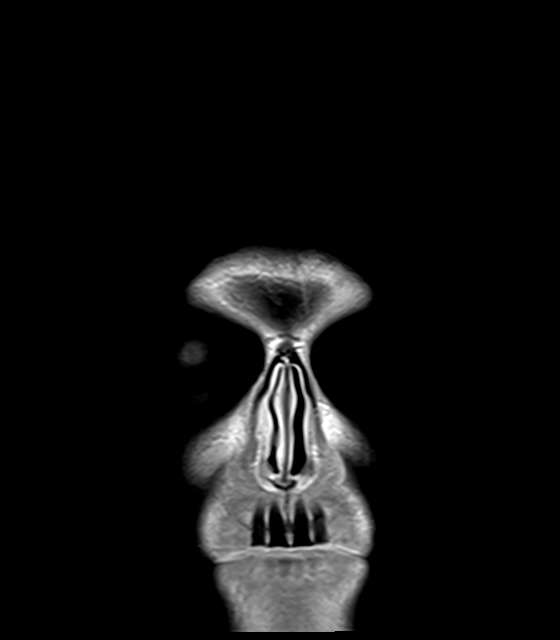
[im 33/33]
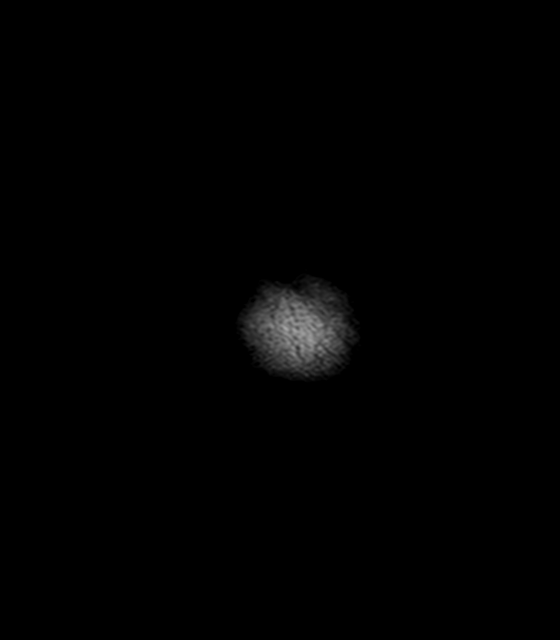

[Series 20: T1 post-contrast · sagittal · 5.0mm · 0.72mm/px · 2 of 23 slices shown (2 of 2)]
[im 1/23]
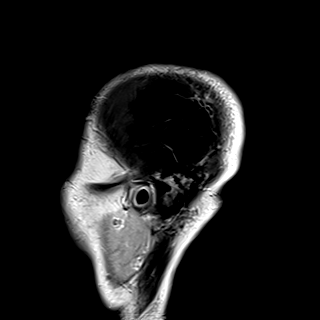
[im 23/23]
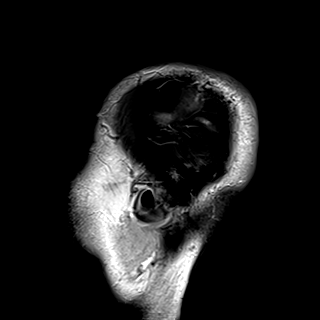

[42 of 48 positions shown; findings below may reference images not displayed]

FINDINGS: Brain: Diffusion imaging does not show any acute or subacute
infarction. Brainstem and cerebellum are normal. There is a chronic
subdural fluid collection on the left without evidence of recent
blood products. Maximal thickness is 15 mm, with mild mass effect
resulting in left-to-right shift of 4 mm. Brain parenchyma shows
minimal small vessel change of the white matter. No cortical or
large vessel territory abnormality. No occipital lobe pathology.
Pituitary gland is unremarkable. Optic chiasm appears normal. After
contrast administration, no abnormal enhancement occurs.

Vascular: Major vessels at the base of the brain show flow.

Skull and upper cervical spine: Negative

Sinuses/Orbits: Clear/normal

Other: None
IMPRESSION: Fluid intensity subdural collection along the convexity on the left
extending from front to back with a maximal thickness of 15 mm
consistent with a chronic subdural collection. No evidence of recent
blood products. Mass effect with left-to-right shift of 4 mm.

Mild chronic small-vessel change of the cerebral hemispheric white
matter. No evidence of occipital cortical infarctions, optic chiasm
pathology or orbital pathology.

These results will be called to the ordering clinician or
representative by the Radiologist Assistant, and communication
documented in the PACS or [REDACTED].

## 2021-01-15 IMAGING — DX DG CHEST 1V PORT
1 series · 1 of 1 positions shown · non-contrast
Comparison: None.

CLINICAL DATA: Cough and fever.

EXAM:
PORTABLE CHEST 1 VIEW

[chest ap]
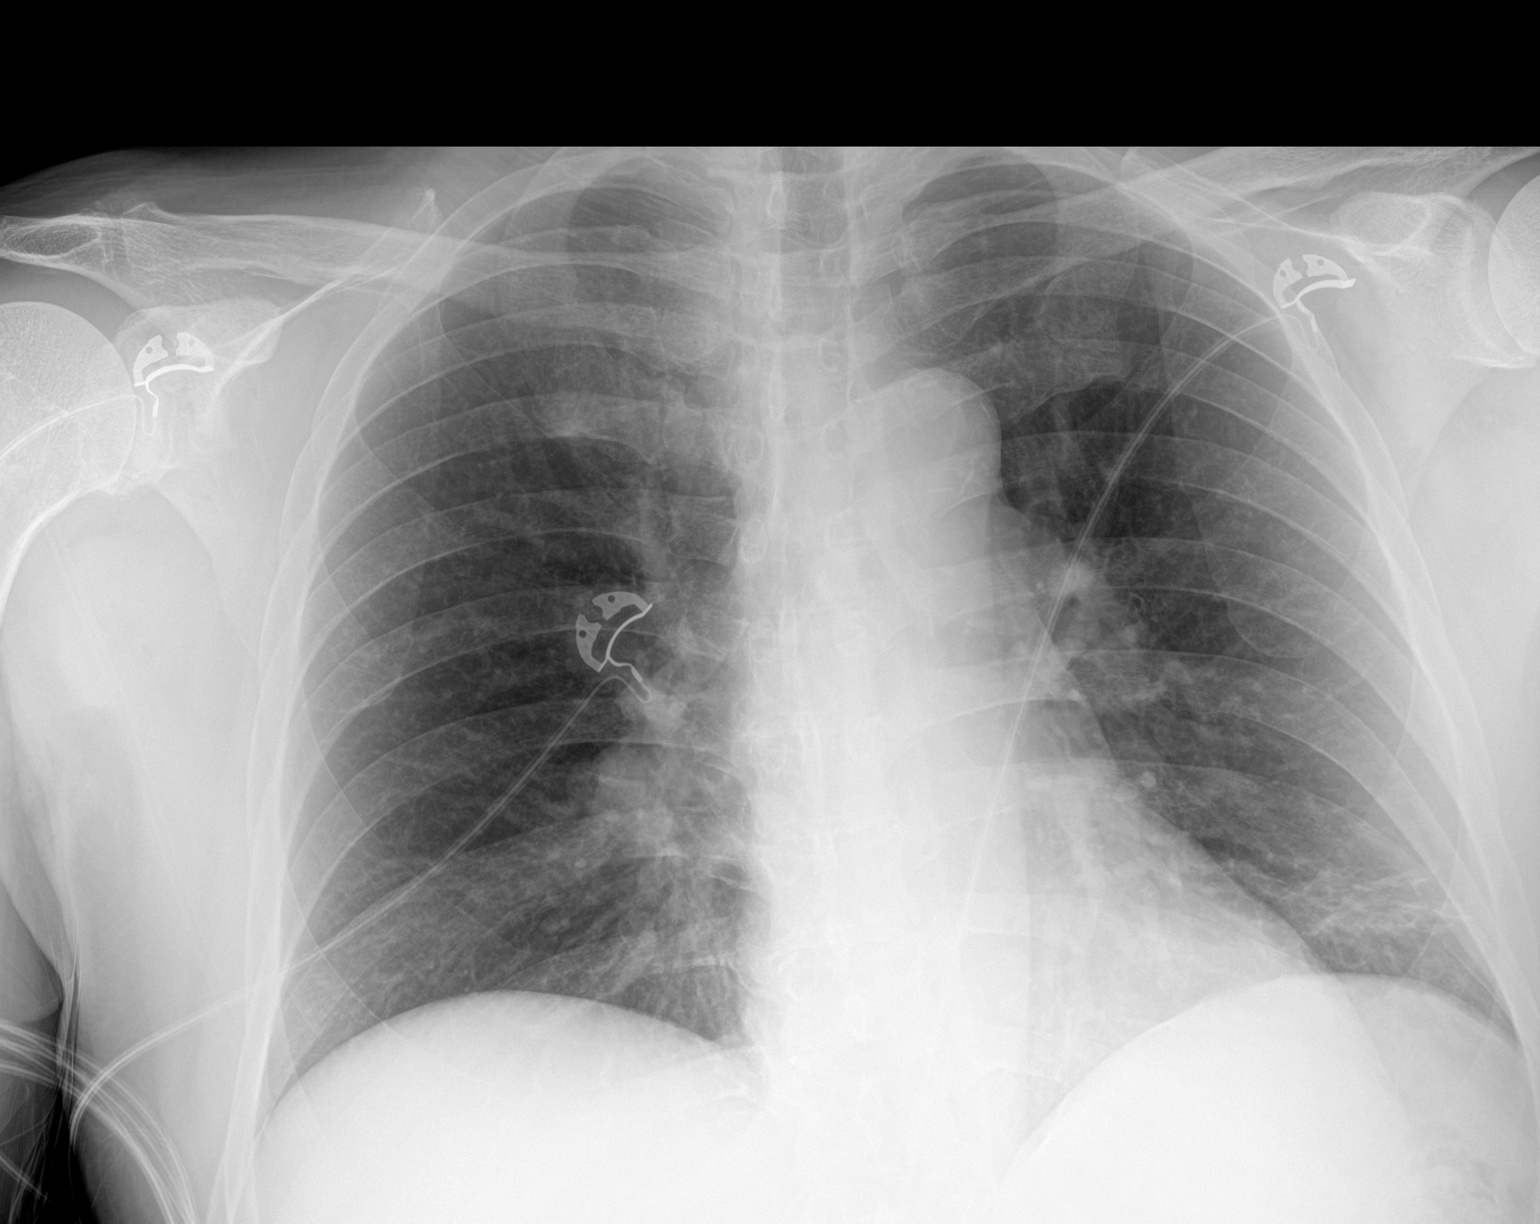

[1 of 1 positions shown; findings below may reference images not displayed]

FINDINGS: Heart size is normal. Atherosclerotic calcifications are present at
the aortic arch. Pulmonary vascular congestion and hilar fullness is
noted bilaterally. Bibasilar airspace opacities are greater left
than right. The upper lung fields are clear. Lung volumes are low.
IMPRESSION: 1. Pulmonary vascular congestion and bilateral hilar fullness.
Findings are concerning for congestive heart failure.
2. Bibasilar airspace disease are worse on the left. This likely
reflects atelectasis.
3. Aortic atherosclerosis.

## 2021-01-19 IMAGING — DX DG CHEST 2V
2 series · 2 of 2 positions shown · non-contrast
Comparison: 05/16/2020

CLINICAL DATA: Shortness of breath

EXAM:
CHEST - 2 VIEW

[chest lat]
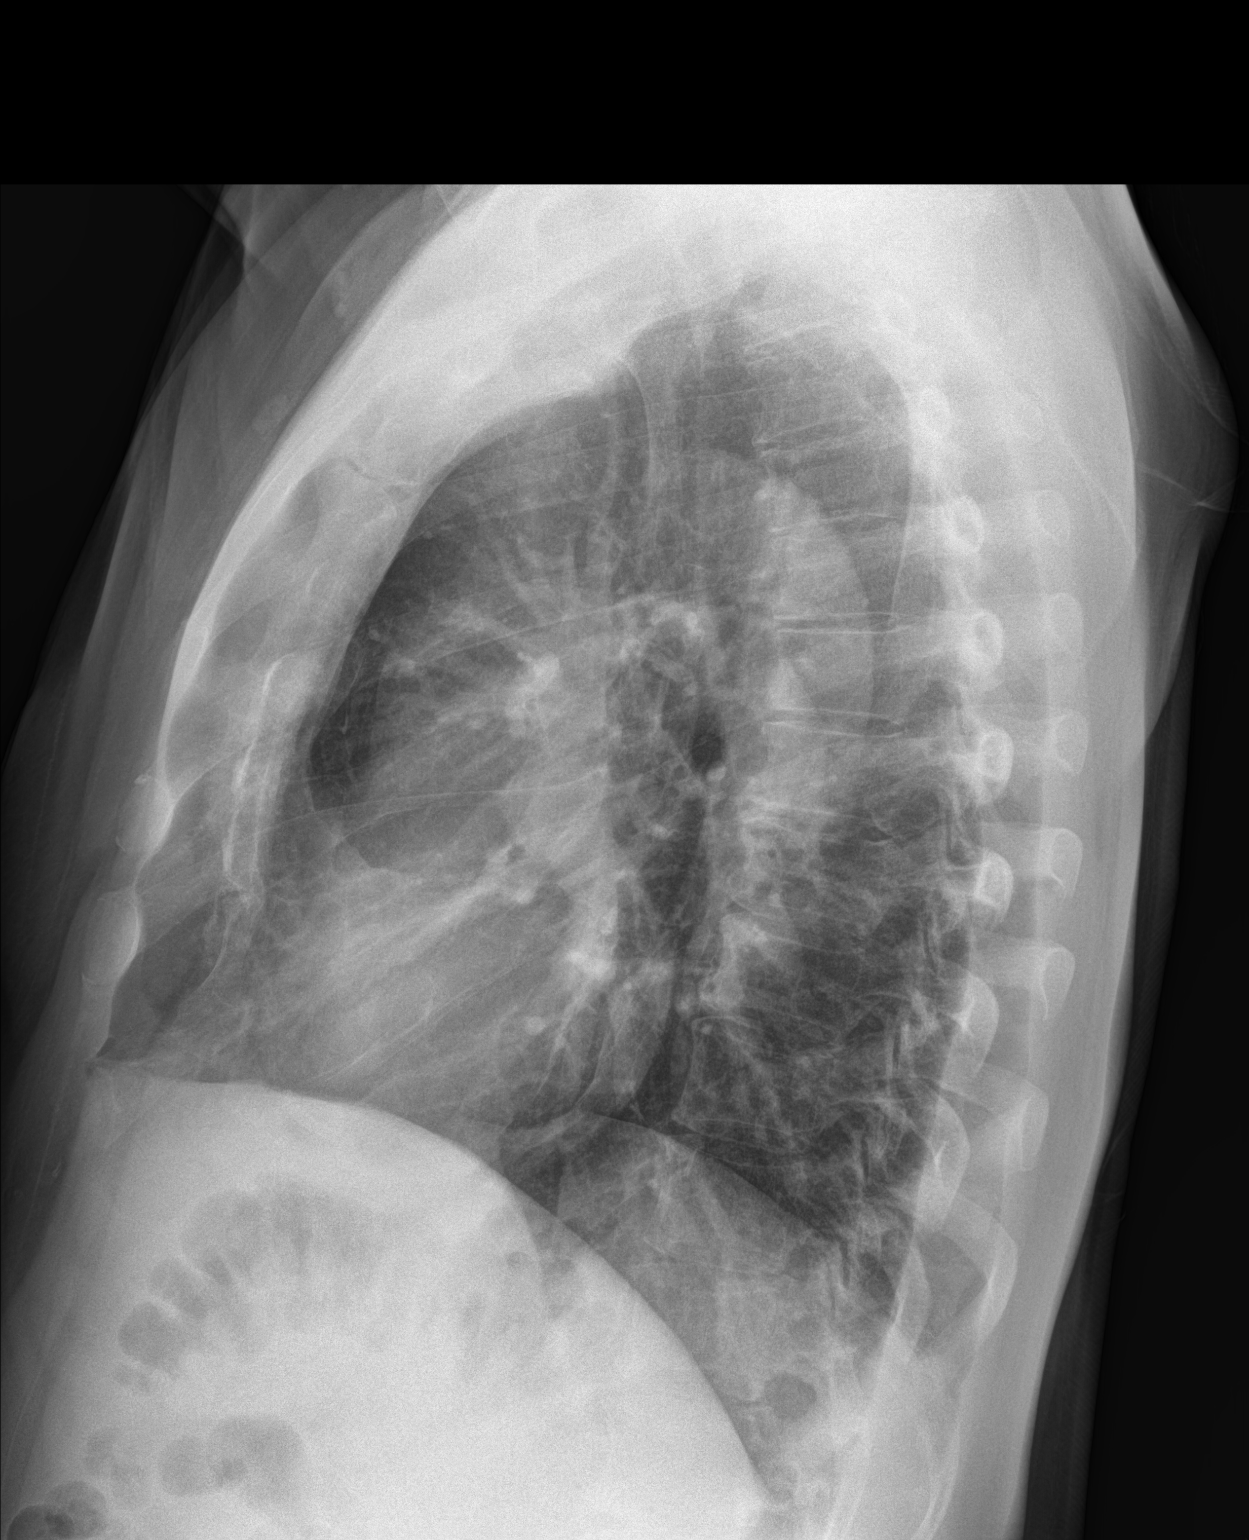

[chest pa]
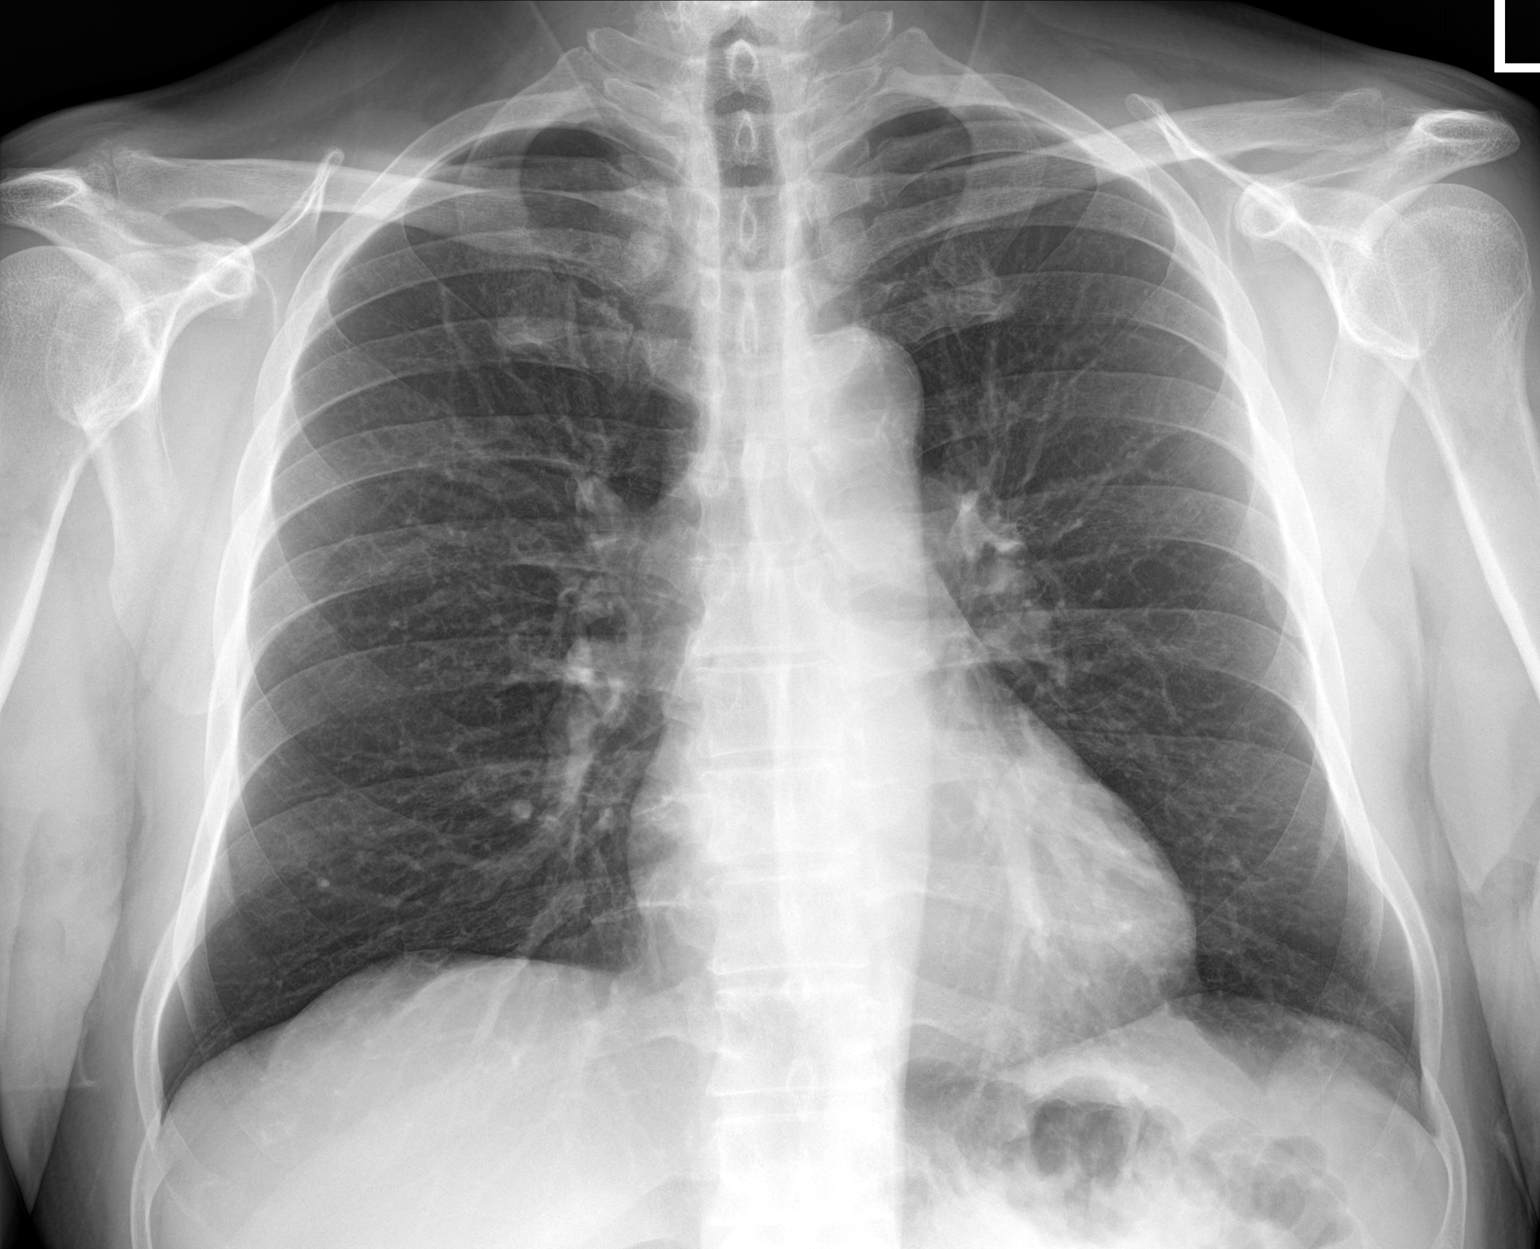

[2 of 2 positions shown; findings below may reference images not displayed]

FINDINGS: The heart size and mediastinal contours are within normal limits.
Both lungs are clear. The visualized skeletal structures are
unremarkable. Mild aortic atherosclerosis.
IMPRESSION: No active cardiopulmonary disease.

## 2021-01-23 ENCOUNTER — Ambulatory Visit (INDEPENDENT_AMBULATORY_CARE_PROVIDER_SITE_OTHER): Payer: PPO | Admitting: Family Medicine

## 2021-01-23 ENCOUNTER — Encounter: Payer: Self-pay | Admitting: Family Medicine

## 2021-01-23 ENCOUNTER — Other Ambulatory Visit: Payer: Self-pay

## 2021-01-23 VITALS — BP 142/77 | HR 58 | Ht 66.0 in | Wt 181.0 lb

## 2021-01-23 DIAGNOSIS — E0840 Diabetes mellitus due to underlying condition with diabetic neuropathy, unspecified: Secondary | ICD-10-CM

## 2021-01-23 DIAGNOSIS — E1342 Other specified diabetes mellitus with diabetic polyneuropathy: Secondary | ICD-10-CM

## 2021-01-23 DIAGNOSIS — E782 Mixed hyperlipidemia: Secondary | ICD-10-CM

## 2021-01-23 DIAGNOSIS — E039 Hypothyroidism, unspecified: Secondary | ICD-10-CM

## 2021-01-23 DIAGNOSIS — E1159 Type 2 diabetes mellitus with other circulatory complications: Secondary | ICD-10-CM | POA: Diagnosis not present

## 2021-01-23 DIAGNOSIS — E11319 Type 2 diabetes mellitus with unspecified diabetic retinopathy without macular edema: Secondary | ICD-10-CM | POA: Diagnosis not present

## 2021-01-23 DIAGNOSIS — I10 Essential (primary) hypertension: Secondary | ICD-10-CM | POA: Diagnosis not present

## 2021-01-23 LAB — BAYER DCA HB A1C WAIVED: HB A1C (BAYER DCA - WAIVED): 7.2 % — ABNORMAL HIGH (ref <7.0)

## 2021-01-23 NOTE — Progress Notes (Signed)
BP (!) 142/77   Pulse (!) 58   Ht '5\' 6"'  (1.676 m)   Wt 181 lb (82.1 kg)   SpO2 97%   BMI 29.21 kg/m    Subjective:   Patient ID: Ethan Peters, male    DOB: 05/02/1956, 65 y.o.   MRN: [id number]  HPI: Ethan Peters is a 65 y.o. male presenting on 01/23/2021 for Medical Management of Chronic Issues and Diabetes   HPI Type 2 diabetes mellitus Patient comes in today for recheck of his diabetes. Patient has been currently taking glipizide and NovoLog 70/30, 40 in the a.m. and 40 in the p.m. and Metformin. Patient is currently on an ACE inhibitor/ARB. Patient has not seen an ophthalmologist this year. Patient denies any issues with their feet. The symptom started onset as an adult hypertension and hyperlipidemia and hypothyroidism ARE RELATED TO DM   Hypertension Patient is currently on candesartan, and their blood pressure today is 142/77. Patient denies any lightheadedness or dizziness. Patient denies headaches, blurred vision, chest pains, shortness of breath, or weakness. Denies any side effects from medication and is content with current medication.   Hyperlipidemia Patient is coming in for recheck of his hyperlipidemia. The patient is currently taking atorvastatin. They deny any issues with myalgias or history of liver damage from it. They deny any focal numbness or weakness or chest pain.   Hypothyroidism recheck Patient is coming in for thyroid recheck today as well. They deny any issues with hair changes or heat or cold problems or diarrhea or constipation. They deny any chest pain or palpitations. They are currently on levothyroxine 50 micrograms   Relevant past medical, surgical, family and social history reviewed and updated as indicated. Interim medical history since our last visit reviewed. Allergies and medications reviewed and updated.  Review of Systems  Constitutional: Negative for chills and fever.  Eyes: Positive for visual disturbance (Legally blind and has  been).  Respiratory: Negative for shortness of breath and wheezing.   Cardiovascular: Negative for chest pain and leg swelling.  Musculoskeletal: Negative for back pain and gait problem.  Skin: Negative for rash.  Neurological: Negative for dizziness, weakness and light-headedness.  All other systems reviewed and are negative.   Per HPI unless specifically indicated above   Allergies as of 01/23/2021      Reactions   Levemir [insulin Detemir] Swelling   Lisinopril Cough   Trazodone And Nefazodone    Penicillins Rash   Lip swelling      Medication List       Accurate as of January 23, 2021  2:25 PM. If you have any questions, ask your nurse or doctor.        allopurinol 300 MG tablet Commonly known as: ZYLOPRIM TAKE ONE TABLET BY MOUTH ONCE DAILY AS NEEDED FOR GOUT prevention   Alphagan P 0.15 % ophthalmic solution Generic drug: brimonidine Place 1 drop into both eyes 2 (two) times daily.   aspirin 81 MG EC tablet Take 1 tablet (81 mg total) by mouth daily.   atorvastatin 80 MG tablet Commonly known as: LIPITOR TAKE 1 TABLET BY MOUTH ONCE DAILY AT 6 PM FOR CHOLESTEROL   B-complex with vitamin C tablet Take 1 tablet by mouth as needed (for vitamin deficiency).   candesartan 16 MG tablet Commonly known as: ATACAND Take 1 tablet (16 mg total) by mouth in the morning and at bedtime.   cholecalciferol 25 MCG (1000 UNIT) tablet Commonly known as: VITAMIN D3 Take 1,000 Units  by mouth daily.   dorzolamide-timolol 22.3-6.8 MG/ML ophthalmic solution Commonly known as: COSOPT Place 1 drop into the right eye 2 (two) times daily.   glipiZIDE 5 MG tablet Commonly known as: GLUCOTROL Take 1 tablet (5 mg total) by mouth daily before breakfast.   glucose blood test strip Use as instructed to test blood sugar up to 6 times daily. DX: E11.9   ibuprofen 800 MG tablet Commonly known as: ADVIL Take 1 tablet (800 mg total) by mouth every 8 (eight) hours as needed for moderate  pain.   insulin aspart protamine- aspart (70-30) 100 UNIT/ML injection Commonly known as: NovoLOG Mix 70/30 44 units before breakfast and 40 units before supper daily   latanoprost 0.005 % ophthalmic solution Commonly known as: XALATAN Place 1 drop into both eyes at bedtime.   levothyroxine 50 MCG tablet Commonly known as: SYNTHROID Take 1 tablet (50 mcg total) by mouth daily.   metFORMIN 1000 MG tablet Commonly known as: GLUCOPHAGE Take 1 tablet (1,000 mg total) by mouth 2 (two) times daily with a meal.   nitroGLYCERIN 0.4 MG SL tablet Commonly known as: NITROSTAT Place 1 tablet (0.4 mg total) under the tongue every 5 (five) minutes x 3 doses as needed for chest pain.   Prodigy Voice Blood Glucose w/Device Kit Use to test blood sugar up to 6 times daily. DX:E11.9 Patient needs talking glucometer        Objective:   BP (!) 142/77   Pulse (!) 58   Ht '5\' 6"'  (1.676 m)   Wt 181 lb (82.1 kg)   SpO2 97%   BMI 29.21 kg/m   Wt Readings from Last 3 Encounters:  01/23/21 181 lb (82.1 kg)  12/29/20 184 lb (83.5 kg)  12/26/20 178 lb (80.7 kg)    Physical Exam Vitals and nursing note reviewed.  Constitutional:      General: He is not in acute distress.    Appearance: He is well-developed. He is not diaphoretic.  Neck:     Thyroid: No thyromegaly.  Cardiovascular:     Rate and Rhythm: Normal rate and regular rhythm.     Heart sounds: Normal heart sounds. No murmur heard.   Pulmonary:     Effort: Pulmonary effort is normal. No respiratory distress.     Breath sounds: Normal breath sounds. No wheezing.  Musculoskeletal:        General: Normal range of motion.     Cervical back: Neck supple.  Lymphadenopathy:     Cervical: No cervical adenopathy.  Skin:    General: Skin is warm and dry.     Findings: No rash.  Neurological:     Mental Status: He is alert and oriented to person, place, and time.     Coordination: Coordination normal.  Psychiatric:        Behavior:  Behavior normal.       Assessment & Plan:   Problem List Items Addressed This Visit      Cardiovascular and Mediastinum   DM type 2 causing vascular disease ([place]) - Primary   Relevant Orders   Bayer DCA Hb A1c Waived   Essential hypertension     Endocrine   Diabetic neuropathy ([place])   Diabetes mellitus due to underlying condition with diabetic neuropathy ([place])   Hypothyroidism   Relevant Orders   TSH   Diabetic retinopathy of both eyes associated with type 2 diabetes mellitus ([place])     Other   Mixed hyperlipidemia  Continue current medication, will check thyroid and A1c.  Otherwise no changes, seems to be doing well. Follow up plan: Return in about 3 months (around 04/24/2021), or if symptoms worsen or fail to improve, for Diabetes and hypertension recheck.  Counseling provided for all of the vaccine components No orders of the defined types were placed in this encounter.   Caryl Pina, MD [facility] 01/23/2021, 2:25 PM

## 2021-01-24 LAB — TSH: TSH: 2.08 u[IU]/mL (ref 0.450–4.500)

## 2021-02-13 DIAGNOSIS — Z79899 Other long term (current) drug therapy: Secondary | ICD-10-CM | POA: Diagnosis not present

## 2021-02-13 DIAGNOSIS — E113593 Type 2 diabetes mellitus with proliferative diabetic retinopathy without macular edema, bilateral: Secondary | ICD-10-CM | POA: Diagnosis not present

## 2021-02-13 DIAGNOSIS — H26491 Other secondary cataract, right eye: Secondary | ICD-10-CM | POA: Diagnosis not present

## 2021-02-13 DIAGNOSIS — H409 Unspecified glaucoma: Secondary | ICD-10-CM | POA: Diagnosis not present

## 2021-02-13 DIAGNOSIS — E11319 Type 2 diabetes mellitus with unspecified diabetic retinopathy without macular edema: Secondary | ICD-10-CM | POA: Diagnosis not present

## 2021-02-13 DIAGNOSIS — Z7984 Long term (current) use of oral hypoglycemic drugs: Secondary | ICD-10-CM | POA: Diagnosis not present

## 2021-02-13 DIAGNOSIS — Z961 Presence of intraocular lens: Secondary | ICD-10-CM | POA: Diagnosis not present

## 2021-02-21 ENCOUNTER — Telehealth: Payer: Self-pay

## 2021-02-22 NOTE — Telephone Encounter (Signed)
Yes that is fine to go ahead and write a note saying that he cannot read or write because he is blind

## 2021-02-22 NOTE — Telephone Encounter (Addendum)
Letter placed on Dettinger's desk for signature.  Pt will come by today and pick up the letter.

## 2021-03-06 DIAGNOSIS — N5201 Erectile dysfunction due to arterial insufficiency: Secondary | ICD-10-CM | POA: Diagnosis not present

## 2021-03-06 DIAGNOSIS — N411 Chronic prostatitis: Secondary | ICD-10-CM | POA: Diagnosis not present

## 2021-04-03 ENCOUNTER — Other Ambulatory Visit: Payer: Self-pay

## 2021-04-03 ENCOUNTER — Ambulatory Visit (INDEPENDENT_AMBULATORY_CARE_PROVIDER_SITE_OTHER): Payer: PPO | Admitting: Nurse Practitioner

## 2021-04-03 ENCOUNTER — Encounter: Payer: Self-pay | Admitting: Nurse Practitioner

## 2021-04-03 VITALS — BP 159/89 | HR 66 | Temp 97.5°F | Ht 65.0 in | Wt 185.0 lb

## 2021-04-03 DIAGNOSIS — R300 Dysuria: Secondary | ICD-10-CM | POA: Insufficient documentation

## 2021-04-03 LAB — URINALYSIS, ROUTINE W REFLEX MICROSCOPIC
Bilirubin, UA: NEGATIVE
Glucose, UA: NEGATIVE
Ketones, UA: NEGATIVE
Leukocytes,UA: NEGATIVE
Nitrite, UA: NEGATIVE
Protein,UA: NEGATIVE
RBC, UA: NEGATIVE
Specific Gravity, UA: 1.015 (ref 1.005–1.030)
Urobilinogen, Ur: 1 mg/dL (ref 0.2–1.0)
pH, UA: 6 (ref 5.0–7.5)

## 2021-04-03 NOTE — Patient Instructions (Addendum)
Urethral Stricture  Urethral stricture is narrowing of the tube (urethra) that carries urine from the bladder out of the body. The urethra can become narrow due to scar tissue from an injury or infection. This can make itdifficult to pass urine. In women, the urethra opens above the vaginal opening. In men, the urethra opens at the tip of the penis, and the urethra is much longer than it is in women. Because of the length of the male urethra, urethral stricture is muchmore common in men. This condition is treated with surgery. What are the causes? In both men and women, common causes of urethral stricture include: Urinary tract infection (UTI). Sexually transmitted infection (STI). Use of a tube placed into the urethra to drain urine from the bladder (urinary catheter). Urinary tract surgery. In men, common causes of urethral stricture include: A severe injury to the pelvis. Prostate surgery. Injury to the penis. In many cases, the cause of urethral stricture is not known. What increases the risk? You are more likely to develop this condition if you: Are male. Men who have had prostate surgery are at risk of developing this condition. Use a urinary catheter. Have had urinary tract surgery. What are the signs or symptoms? The main symptom of this condition is difficulty passing urine. This may cause decreased urine flow, dribbling, or spraying of urine. Other symptom of this condition may include: Frequent UTIs. Blood in the urine. Pain when urinating. Swelling of the penis in men. Inability to pass urine (urinary obstruction). How is this diagnosed? This condition may be diagnosed based on: Your medical history and a physical exam. Urine tests to check for infection or bleeding. X-rays. Ultrasound. Retrograde urethrogram. This is a type of test in which dye is injected into the urethra and then an X-ray is taken. Urethroscopy. This is when a thin tube with a light and camera on the  end (urethroscope) is used to look at the urethra. How is this treated? This condition is treated with surgery. The type of surgery that you have depends on the severity of your condition. You may have: Urethral dilation. In this procedure, the narrow part of the urethra is stretched open (dilated) with dilating instruments or a small balloon. Urethrotomy. In this procedure, a urethroscope is placed into the urethra, and the narrow part of the urethra is cut open with a surgical blade inserted through the urethroscope. Open surgery. In this procedure, an incision is made in the urethra, the narrow part is removed, and the urethra is reconstructed. Follow these instructions at home:  Take over-the-counter and prescription medicines only as told by your health care provider. If you were prescribed an antibiotic medicine, take it as told by your health care provider. Do not stop taking the antibiotic even if you start to feel better. Drink enough fluid to keep your urine pale yellow. Keep all follow-up visits as told by your health care provider. This is important. Contact a health care provider if: You have signs of a urinary tract infection, such as: Frequent urination or passing small amounts of urine frequently. Needing to urinate urgently. Pain or burning with urination. Urine that smells bad or unusual. Cloudy urine. Pain in the lower abdomen or back. Trouble urinating. Blood in the urine. Vomiting or being less hungry than normal. Diarrhea or abdominal pain. Vaginal discharge, if you are male. Your symptoms are getting worse instead of better. Get help right away if: You cannot pass urine. You have a fever. You have  swelling, bruising, or discoloration of your genital area. This includes the penis, scrotum, and inner thighs for men, and the outer genital organs (vulva) and inner thighs for women. You develop swelling in your legs. You have difficulty breathing. Summary Urethral  stricture is narrowing of the tube (urethra) that carries urine from the bladder out of the body. The urethra can become narrow due to scar tissue from an injury or infection. This condition can make it difficult to pass urine. This condition is treated with surgery. The type of surgery that you have depends on the severity of your condition. Contact a health care provider if your symptoms get worse or you have signs of a urinary tract infection. This information is not intended to replace advice given to you by your health care provider. Make sure you discuss any questions you have with your healthcare provider. Document Revised: 05/14/2018 Document Reviewed: 05/14/2018 Elsevier Patient Education  2022 [employer] Dysuria Dysuria is pain or discomfort during urination. The pain or discomfort may be felt in the part of the body that drains urine from the bladder (urethra) or in the surrounding tissue of the genitals. The pain may also be felt inthe groin area, lower abdomen, or lower back. You may have to urinate frequently or have the sudden feeling that you have to urinate (urgency). Dysuria can affect anyone, but it is more common in females. Dysuria can be caused by many different things, including: Urinary tract infection. Kidney stones or bladder stones. Certain STIs (sexually transmitted infections), such as chlamydia. Dehydration. Inflammation of the tissues of the vagina. Use of certain medicines. Use of certain soaps or scented products that cause irritation. Follow these instructions at home: Medicines Take over-the-counter and prescription medicines only as told by your health care provider. If you were prescribed an antibiotic medicine, take it as told by your health care provider. Do not stop taking the antibiotic even if you start to feel better. Eating and drinking  Drink enough fluid to keep your urine pale yellow. Avoid caffeinated beverages, tea, and alcohol. These  beverages can irritate the bladder and make dysuria worse. In males, alcohol may irritate the prostate.  General instructions Watch your condition for any changes. Urinate often. Avoid holding urine for long periods of time. If you are male, you should wipe from front to back after urinating or having a bowel movement. Use each piece of toilet paper only once. Empty your bladder after sex. Keep all follow-up visits. This is important. If you had any tests done to find the cause of dysuria, it is up to you to get your test results. Ask your health care provider, or the department that is doing the test, when your results will be ready. Contact a health care provider if: You have a fever. You develop pain in your back or sides. You have nausea or vomiting. You have blood in your urine. You are not urinating as often as you usually do. Get help right away if: Your pain is severe and not relieved with medicines. You cannot eat or drink without vomiting. You are confused. You have a rapid heartbeat while resting. You have shaking or chills. You feel extremely weak. Summary Dysuria is pain or discomfort while urinating. Many different conditions can lead to dysuria. If you have dysuria, you may have to urinate frequently or have the sudden feeling that you have to urinate (urgency). Watch your condition for any changes. Keep all follow-up visits. Make sure that you urinate  often and drink enough fluid to keep your urine pale yellow. This information is not intended to replace advice given to you by your health care provider. Make sure you discuss any questions you have with your healthcare provider. Document Revised: 05/13/2020 Document Reviewed: 05/13/2020 Elsevier Patient Education  [place].

## 2021-04-03 NOTE — Progress Notes (Signed)
Acute Office Visit  Subjective:    Patient ID: Ethan Peters, male    DOB: 29-Apr-1956, 65 y.o.   MRN: [id number]  Chief Complaint  Patient presents with   Dysuria    Dysuria  This is a recurrent problem. The current episode started 1 to 4 weeks ago. The problem has been unchanged. The quality of the pain is described as burning. The pain is moderate. There has been no fever. He is Sexually active. There is No history of pyelonephritis. Pertinent negatives include no chills, discharge, flank pain, frequency, hematuria, hesitancy, nausea, sweats, urgency or vomiting. He has tried nothing for the symptoms. There is no history of catheterization or kidney stones.   Past Medical History:  Diagnosis Date   CAD (coronary artery disease) 01/09/13   Inferior STEMI s/p DES-RCA   Cataract    Diabetes mellitus    Uncontrolled, Hgb A1C 14.8% on 03/14, started on insulin   Gout    Hyperlipidemia    Hypertension    Ischemic cardiomyopathy    EF 73-71%, grade 1 diastolic dysfunction, mildly dilated RV, RA at the upper limits of normal, mild TR, PA systolic pressure 32 mm mercury and hypokinesis to akinesis of the basal mid inferior myocardium   Myocardial infarction ([place]) 01/09/2013   Shortness of breath    Thyroid disease     Past Surgical History:  Procedure Laterality Date   CARDIAC CATHETERIZATION  01/22/2013   Diffuse borderline residual CAD consistent with uncontrolled diabetes, medical management recommended   CORONARY ANGIOPLASTY WITH STENT PLACEMENT  01/09/2013   30% pLAD, 30% mLAD, 70% pLCx, RCA occlusion at crux with R->L distal collaterals s/p DES; LVEF 50%, moderate-severe HK of inferior basal wall   EYE SURGERY Bilateral    LEFT HEART CATHETERIZATION WITH CORONARY ANGIOGRAM N/A 01/09/2013   Procedure: LEFT HEART CATHETERIZATION WITH CORONARY ANGIOGRAM;  Surgeon: Peter M Martinique, MD;  Location: [facility] CATH LAB;  Service: Cardiovascular;  Laterality: N/A;   LEFT HEART  CATHETERIZATION WITH CORONARY ANGIOGRAM N/A 01/22/2013   Procedure: LEFT HEART CATHETERIZATION WITH CORONARY ANGIOGRAM;  Surgeon: Sherren Mocha, MD;  Location: [facility] C[facility] LAB;  Service: Cardiovascular;  Laterality: N/A;    Family History  Problem Relation Age of Onset   Heart disease Mother    Heart disease Father    Heart disease Brother    Heart disease Brother    Colon cancer Neg Hx    Colon polyps Neg Hx     Social History   Socioeconomic History   Marital status: Married    Spouse name: Glori Bickers   Number of children: 1   Years of education: 16   Highest education level: Associate degree: occupational, Hotel manager, or vocational program  Occupational History   Occupation: disabled    Comment: Furniture conservator/restorer   Tobacco Use   Smoking status: Never   Smokeless tobacco: Never  Vaping Use   Vaping Use: Never used  Substance and Sexual Activity   Alcohol use: Yes    Comment: 4 times per year-wine or beer   Drug use: No   Sexual activity: Yes    Birth control/protection: None  Other Topics Concern   Not on file  Social History Narrative   Spanish-speaking. Limited English.    Social Determinants of Health   Financial Resource Strain: Not on file  Food Insecurity: Not on file  Transportation Needs: Not on file  Physical Activity: Not on file  Stress: Not on file  Social Connections: Not on file  Intimate Partner Violence: Not on file    Outpatient Medications Prior to Visit  Medication Sig Dispense Refill   allopurinol (ZYLOPRIM) 300 MG tablet TAKE ONE TABLET BY MOUTH ONCE DAILY AS NEEDED FOR GOUT prevention 90 tablet 3   ALPHAGAN P 0.1 % SOLN Apply 1 drop to eye 3 (three) times daily.     aspirin EC 81 MG EC tablet Take 1 tablet (81 mg total) by mouth daily.     atorvastatin (LIPITOR) 80 MG tablet TAKE 1 TABLET BY MOUTH ONCE DAILY AT 6 PM FOR CHOLESTEROL 90 tablet 3   B Complex-C (B-COMPLEX WITH VITAMIN C) tablet Take 1 tablet by mouth as needed (for vitamin  deficiency).     Blood Glucose Monitoring Suppl (PRODIGY VOICE BLOOD GLUCOSE) w/Device KIT Use to test blood sugar up to 6 times daily. DX:E11.9 Patient needs talking glucometer 1 kit 0   candesartan (ATACAND) 16 MG tablet Take 1 tablet (16 mg total) by mouth in the morning and at bedtime. 180 tablet 3   cholecalciferol (VITAMIN D3) 25 MCG (1000 UNIT) tablet Take 1,000 Units by mouth daily.     dorzolamide-timolol (COSOPT) 22.3-6.8 MG/ML ophthalmic solution Place 1 drop into the right eye 2 (two) times daily.     glipiZIDE (GLUCOTROL) 5 MG tablet Take 1 tablet (5 mg total) by mouth daily before breakfast. 90 tablet 3   glucose blood test strip Use as instructed to test blood sugar up to 6 times daily. DX: E11.9 300 each 12   ibuprofen (ADVIL) 800 MG tablet Take 1 tablet (800 mg total) by mouth every 8 (eight) hours as needed for moderate pain. 270 tablet 1   insulin aspart protamine- aspart (NOVOLOG MIX 70/30) (70-30) 100 UNIT/ML injection 44 units before breakfast and 40 units before supper daily 90 mL 3   latanoprost (XALATAN) 0.005 % ophthalmic solution Place 1 drop into both eyes at bedtime.     levothyroxine (SYNTHROID) 50 MCG tablet Take 1 tablet (50 mcg total) by mouth daily. 90 tablet 3   meloxicam (MOBIC) 15 MG tablet Take 1 tablet by mouth daily.     metFORMIN (GLUCOPHAGE) 1000 MG tablet Take 1 tablet (1,000 mg total) by mouth 2 (two) times daily with a meal. 180 tablet 3   nitroGLYCERIN (NITROSTAT) 0.4 MG SL tablet Place 1 tablet (0.4 mg total) under the tongue every 5 (five) minutes x 3 doses as needed for chest pain. 75 tablet 3   tamsulosin (FLOMAX) 0.4 MG CAPS capsule Take 0.4 mg by mouth daily.     latanoprost (XALATAN) 0.005 % ophthalmic solution Place 1 drop into both eyes nightly.     ALPHAGAN P 0.15 % ophthalmic solution Place 1 drop into both eyes 2 (two) times daily.     No facility-administered medications prior to visit.    Allergies  Allergen Reactions   Levemir  [Insulin Detemir] Swelling   Lisinopril Cough   Trazodone And Nefazodone    Penicillins Rash    Lip swelling    Review of Systems  Constitutional:  Negative for activity change and chills.  Gastrointestinal:  Negative for nausea and vomiting.  Genitourinary:  Positive for dysuria. Negative for flank pain, frequency, genital sores, hematuria, hesitancy, penile discharge, penile pain and urgency.  Musculoskeletal: Negative.   Skin:  Negative for rash.  All other systems reviewed and are negative.     Objective:    Physical Exam Vitals and nursing note reviewed. Exam conducted with a chaperone present (spouse).  Constitutional:      Appearance: Normal appearance.  HENT:     Head: Normocephalic.     Mouth/Throat:     Pharynx: Oropharynx is clear.  Eyes:     Conjunctiva/sclera: Conjunctivae normal.  Cardiovascular:     Rate and Rhythm: Normal rate and regular rhythm.  Pulmonary:     Effort: Pulmonary effort is normal.     Breath sounds: Normal breath sounds.  Abdominal:     General: Bowel sounds are normal.  Skin:    Findings: No rash.  Neurological:     Mental Status: He is alert and oriented to person, place, and time.  Psychiatric:        Behavior: Behavior normal.    BP (!) 159/89   Pulse 66   Temp (!) 97.5 F (36.4 C) (Temporal)   Ht '5\' 5"'  (1.651 m)   Wt 185 lb (83.9 kg)   SpO2 99%   BMI 30.79 kg/m  Wt Readings from Last 3 Encounters:  04/03/21 185 lb (83.9 kg)  01/23/21 181 lb (82.1 kg)  12/29/20 184 lb (83.5 kg)    Health Maintenance Due  Topic Date Due   COVID-19 Vaccine (1) Never done   Zoster Vaccines- Shingrix (1 of 2) Never done   FOOT EXAM  01/27/2021    There are no preventive care reminders to display for this patient.   Lab Results  Component Value Date   TSH 2.080 01/23/2021   Lab Results  Component Value Date   WBC 7.7 10/26/2020   HGB 13.0 10/26/2020   HCT 38.5 10/26/2020   MCV 93 10/26/2020   PLT 203 10/26/2020   Lab  Results  Component Value Date   NA 144 10/26/2020   K 4.5 10/26/2020   CO2 21 10/26/2020   GLUCOSE 59 (L) 10/26/2020   BUN 29 (H) 10/26/2020   CREATININE 0.95 10/26/2020   BILITOT 0.4 10/26/2020   ALKPHOS 88 10/26/2020   AST 16 10/26/2020   ALT 13 10/26/2020   PROT 7.7 10/26/2020   ALBUMIN 4.8 10/26/2020   CALCIUM 9.9 10/26/2020   ANIONGAP 12 05/16/2020   Lab Results  Component Value Date   CHOL 106 10/26/2020   Lab Results  Component Value Date   HDL 40 10/26/2020   Lab Results  Component Value Date   LDLCALC 50 10/26/2020   Lab Results  Component Value Date   TRIG 79 10/26/2020   Lab Results  Component Value Date   CHOLHDL 2.7 10/26/2020   Lab Results  Component Value Date   HGBA1C 7.2 (H) 01/23/2021       Assessment & Plan:   Problem List Items Addressed This Visit       Other   Dysuria - Primary    Symptoms of urinary burning during urination in the past few weeks not resolved, patient was seen by urology and prescribed flomax. Patient continues to report symptoms. Urinalysis returned negative for UTI and STI. On assessment, patients symptoms sounds more like Urethral stricture. Patient is not experiencing CVA tenderness, Nausea, vomiting, fever, discharge or pelvic pain.  Advised patient to continue taking medication as prescribed, increase hydration, and follow up with Urology with worsening or unresolved symptoms.        Relevant Orders   Urinalysis, Routine w reflex microscopic     No orders of the defined types were placed in this encounter.    Ivy Lynn, NP

## 2021-04-03 NOTE — Assessment & Plan Note (Signed)
Symptoms of urinary burning during urination in the past few weeks not resolved, patient was seen by urology and prescribed flomax. Patient continues to report symptoms. Urinalysis returned negative for UTI and STI. On assessment, patients symptoms sounds more like Urethral stricture. Patient is not experiencing CVA tenderness, Nausea, vomiting, fever, discharge or pelvic pain.  Advised patient to continue taking medication as prescribed, increase hydration, and follow up with Urology with worsening or unresolved symptoms.

## 2021-04-07 DIAGNOSIS — Z23 Encounter for immunization: Secondary | ICD-10-CM | POA: Diagnosis not present

## 2021-04-13 ENCOUNTER — Other Ambulatory Visit: Payer: Self-pay

## 2021-04-13 ENCOUNTER — Encounter: Payer: Self-pay | Admitting: Family Medicine

## 2021-04-13 ENCOUNTER — Ambulatory Visit (HOSPITAL_COMMUNITY)
Admission: RE | Admit: 2021-04-13 | Discharge: 2021-04-13 | Disposition: A | Discharge status: Home / Self Care | Payer: PPO | Source: Ambulatory Visit | Attending: Family Medicine | Admitting: Family Medicine

## 2021-04-13 ENCOUNTER — Ambulatory Visit (INDEPENDENT_AMBULATORY_CARE_PROVIDER_SITE_OTHER): Payer: PPO | Admitting: Family Medicine

## 2021-04-13 VITALS — BP 159/87 | HR 61 | Ht 65.0 in | Wt 191.0 lb

## 2021-04-13 DIAGNOSIS — M7989 Other specified soft tissue disorders: Secondary | ICD-10-CM | POA: Insufficient documentation

## 2021-04-13 LAB — CMP14+EGFR
ALT: 12 IU/L (ref 0–44)
AST: 10 IU/L (ref 0–40)
Albumin/Globulin Ratio: 1.6 (ref 1.2–2.2)
Albumin: 4.4 g/dL (ref 3.8–4.8)
Alkaline Phosphatase: 69 IU/L (ref 44–121)
BUN/Creatinine Ratio: 13 (ref 10–24)
BUN: 16 mg/dL (ref 8–27)
Bilirubin Total: 0.4 mg/dL (ref 0.0–1.2)
CO2: 23 mmol/L (ref 20–29)
Calcium: 9.1 mg/dL (ref 8.6–10.2)
Chloride: 104 mmol/L (ref 96–106)
Creatinine, Ser: 1.22 mg/dL (ref 0.76–1.27)
Globulin, Total: 2.7 g/dL (ref 1.5–4.5)
Glucose: 110 mg/dL — ABNORMAL HIGH (ref 65–99)
Potassium: 5.1 mmol/L (ref 3.5–5.2)
Sodium: 141 mmol/L (ref 134–144)
Total Protein: 7.1 g/dL (ref 6.0–8.5)
eGFR: 66 mL/min/{1.73_m2} (ref >59)

## 2021-04-13 NOTE — Progress Notes (Signed)
BP (!) 159/87   Pulse 61   Ht '5\' 5"'  (1.651 m)   Wt 191 lb (86.6 kg)   SpO2 99%   BMI 31.78 kg/m    Subjective:   Patient ID: Ethan Peters, male    DOB: 03/11/1956, 65 y.o.   MRN: [id number]  HPI: Ethan Peters is a 65 y.o. male presenting on 04/13/2021 for Leg Swelling (Left leg has more swelling than the right. Swelling does not go down during the night. Pt has a tingling sensation.)   HPI Patient is coming in complaining of left leg swelling, and right leg swelling.  He says he initially started with both leg swelling some about 3 weeks ago, he says is about the same time that he started meloxicam and Flomax.  They would go up during the day and go down at night initially and that was not a problem but now the right leg has gone down but the left leg has stayed swollen.  He denies any pain or fevers or chills or skin changes.  Relevant past medical, surgical, family and social history reviewed and updated as indicated. Interim medical history since our last visit reviewed. Allergies and medications reviewed and updated.  Review of Systems  Constitutional:  Negative for chills and fever.  Eyes:  Negative for visual disturbance.  Respiratory:  Negative for shortness of breath and wheezing.   Cardiovascular:  Positive for leg swelling. Negative for chest pain.  Musculoskeletal:  Negative for back pain and gait problem.  Skin:  Negative for rash.  Neurological:  Negative for dizziness, weakness and light-headedness.  All other systems reviewed and are negative.  Per HPI unless specifically indicated above   Allergies as of 04/13/2021       Reactions   Levemir [insulin Detemir] Swelling   Lisinopril Cough   Trazodone And Nefazodone    Penicillins Rash   Lip swelling        Medication List        Accurate as of April 13, 2021  9:54 AM. If you have any questions, ask your nurse or doctor.          allopurinol 300 MG tablet Commonly known as: ZYLOPRIM TAKE  ONE TABLET BY MOUTH ONCE DAILY AS NEEDED FOR GOUT prevention   Alphagan P 0.1 % Soln Generic drug: brimonidine Apply 1 drop to eye 3 (three) times daily.   aspirin 81 MG EC tablet Take 1 tablet (81 mg total) by mouth daily.   atorvastatin 80 MG tablet Commonly known as: LIPITOR TAKE 1 TABLET BY MOUTH ONCE DAILY AT 6 PM FOR CHOLESTEROL   B-complex with vitamin C tablet Take 1 tablet by mouth as needed (for vitamin deficiency).   candesartan 16 MG tablet Commonly known as: ATACAND Take 1 tablet (16 mg total) by mouth in the morning and at bedtime.   cholecalciferol 25 MCG (1000 UNIT) tablet Commonly known as: VITAMIN D3 Take 1,000 Units by mouth daily.   dorzolamide-timolol 22.3-6.8 MG/ML ophthalmic solution Commonly known as: COSOPT Place 1 drop into the right eye 2 (two) times daily.   glipiZIDE 5 MG tablet Commonly known as: GLUCOTROL Take 1 tablet (5 mg total) by mouth daily before breakfast.   glucose blood test strip Use as instructed to test blood sugar up to 6 times daily. DX: E11.9   ibuprofen 800 MG tablet Commonly known as: ADVIL Take 1 tablet (800 mg total) by mouth every 8 (eight) hours as needed for moderate pain.  insulin aspart protamine- aspart (70-30) 100 UNIT/ML injection Commonly known as: NovoLOG Mix 70/30 44 units before breakfast and 40 units before supper daily   latanoprost 0.005 % ophthalmic solution Commonly known as: XALATAN Place 1 drop into both eyes at bedtime.   levothyroxine 50 MCG tablet Commonly known as: SYNTHROID Take 1 tablet (50 mcg total) by mouth daily.   meloxicam 15 MG tablet Commonly known as: MOBIC Take 1 tablet by mouth daily.   metFORMIN 1000 MG tablet Commonly known as: GLUCOPHAGE Take 1 tablet (1,000 mg total) by mouth 2 (two) times daily with a meal.   nitroGLYCERIN 0.4 MG SL tablet Commonly known as: NITROSTAT Place 1 tablet (0.4 mg total) under the tongue every 5 (five) minutes x 3 doses as needed for  chest pain.   Prodigy Voice Blood Glucose w/Device Kit Use to test blood sugar up to 6 times daily. DX:E11.9 Patient needs talking glucometer   tamsulosin 0.4 MG Caps capsule Commonly known as: FLOMAX Take 0.4 mg by mouth daily.         Objective:   BP (!) 159/87   Pulse 61   Ht '5\' 5"'  (1.651 m)   Wt 191 lb (86.6 kg)   SpO2 99%   BMI 31.78 kg/m   Wt Readings from Last 3 Encounters:  04/13/21 191 lb (86.6 kg)  04/03/21 185 lb (83.9 kg)  01/23/21 181 lb (82.1 kg)    Physical Exam Vitals and nursing note reviewed.  Constitutional:      General: He is not in acute distress.    Appearance: He is well-developed. He is not diaphoretic.  Eyes:     General: No scleral icterus.    Conjunctiva/sclera: Conjunctivae normal.  Neck:     Thyroid: No thyromegaly.  Musculoskeletal:        General: Swelling (1+ pitting edema in left lower extremity) present. Normal range of motion.     Cervical back: Neck supple.  Lymphadenopathy:     Cervical: No cervical adenopathy.  Skin:    General: Skin is warm and dry.     Findings: No rash.  Neurological:     Mental Status: He is alert and oriented to person, place, and time.     Coordination: Coordination normal.  Psychiatric:        Behavior: Behavior normal.      Assessment & Plan:   Problem List Items Addressed This Visit   None Visit Diagnoses     Left leg swelling    -  Primary   Relevant Orders   US Venous Img Lower Unilateral Left   CMP14+EGFR       Will send for DVT ultrasound to make sure that is not the cause of the swelling in the left leg and if it has not been recommended that he stop the meloxicam and see if the swelling improves. Follow up plan: Return if symptoms worsen or fail to improve.  Counseling provided for all of the vaccine components Orders Placed This Encounter  Procedures   US Venous Img Lower Unilateral Left   CMP14+EGFR    Caryl Pina, MD [facility] 04/13/2021, 9:54 AM

## 2021-04-14 NOTE — Progress Notes (Signed)
Ethan Peters Date of Birth: 1956/06/25 Medical Record #[id number]  History of Present Illness: [name] is seen for followup of CAD. He is status post inferior STEMI on 01/09/2013. He had stenting of the RCA at the crux. He had persistent chest pain and dyspnea even after his infarct with atypical symptoms. He subsequently underwent repeat cardiac catheterization on April 10,2014 which showed excellent patency of the stent in the RCA. He does have a long 70% stenosis in the left circumflex,  treated medically. He had a Myovew study in September 2017 which showed an inferior scar without ischemia. EF was 48%.  He has a history of poorly controlled diabetes mellitus with retinopathy and neuropathy.   He was seen in the ED in August 2021with progressive left eye blindness. MRI done showed an incidental chronic SDH. Evaluated by Neurosurgery and no further therapy needed.   On follow up today he is doing well. He is now blind. He does complain with intermittent slight pain in his left chest radiating to the left arm with numbness in his left hand. This is not related to exertion.  Has not had to use Ntg. Myoview in January was unchanged from prior with no ischemia. Last A1c was 7.2%. He has lost 8 lbs.    Current Outpatient Medications on File Prior to Visit  Medication Sig Dispense Refill   allopurinol (ZYLOPRIM) 300 MG tablet TAKE ONE TABLET BY MOUTH ONCE DAILY AS NEEDED FOR GOUT prevention 90 tablet 3   ALPHAGAN P 0.1 % SOLN Apply 1 drop to eye 3 (three) times daily.     aspirin EC 81 MG EC tablet Take 1 tablet (81 mg total) by mouth daily.     atorvastatin (LIPITOR) 80 MG tablet TAKE 1 TABLET BY MOUTH ONCE DAILY AT 6 PM FOR CHOLESTEROL 90 tablet 3   B Complex-C (B-COMPLEX WITH VITAMIN C) tablet Take 1 tablet by mouth as needed (for vitamin deficiency).     Blood Glucose Monitoring Suppl (PRODIGY VOICE BLOOD GLUCOSE) w/Device KIT Use to test blood sugar up to 6 times daily. DX:E11.9  Patient needs talking glucometer 1 kit 0   candesartan (ATACAND) 16 MG tablet Take 1 tablet (16 mg total) by mouth in the morning and at bedtime. 180 tablet 3   cholecalciferol (VITAMIN D3) 25 MCG (1000 UNIT) tablet Take 1,000 Units by mouth daily.     dorzolamide-timolol (COSOPT) 22.3-6.8 MG/ML ophthalmic solution Place 1 drop into the right eye 2 (two) times daily.     glipiZIDE (GLUCOTROL) 5 MG tablet Take 1 tablet (5 mg total) by mouth daily before breakfast. 90 tablet 3   glucose blood test strip Use as instructed to test blood sugar up to 6 times daily. DX: E11.9 300 each 12   insulin aspart protamine- aspart (NOVOLOG MIX 70/30) (70-30) 100 UNIT/ML injection 44 units before breakfast and 40 units before supper daily 90 mL 3   latanoprost (XALATAN) 0.005 % ophthalmic solution Place 1 drop into both eyes at bedtime.     levothyroxine (SYNTHROID) 50 MCG tablet Take 1 tablet (50 mcg total) by mouth daily. 90 tablet 3   meloxicam (MOBIC) 15 MG tablet Take 1 tablet by mouth daily.     metFORMIN (GLUCOPHAGE) 1000 MG tablet Take 1 tablet (1,000 mg total) by mouth 2 (two) times daily with a meal. 180 tablet 3   nitroGLYCERIN (NITROSTAT) 0.4 MG SL tablet Place 1 tablet (0.4 mg total) under the tongue every 5 (five) minutes x 3  doses as needed for chest pain. 75 tablet 3   tamsulosin (FLOMAX) 0.4 MG CAPS capsule Take 0.4 mg by mouth daily.     No current facility-administered medications on file prior to visit.    Allergies  Allergen Reactions   Levemir [Insulin Detemir] Swelling   Lisinopril Cough   Trazodone And Nefazodone    Penicillins Rash    Lip swelling    Past Medical History:  Diagnosis Date   CAD (coronary artery disease) 01/09/13   Inferior STEMI s/p DES-RCA   Cataract    Diabetes mellitus    Uncontrolled, Hgb A1C 14.8% on 03/14, started on insulin   Gout    Hyperlipidemia    Hypertension    Ischemic cardiomyopathy    EF 35-00%, grade 1 diastolic dysfunction, mildly dilated  RV, RA at the upper limits of normal, mild TR, PA systolic pressure 32 mm mercury and hypokinesis to akinesis of the basal mid inferior myocardium   Myocardial infarction ([place]) 01/09/2013   Shortness of breath    Thyroid disease     Past Surgical History:  Procedure Laterality Date   CARDIAC CATHETERIZATION  01/22/2013   Diffuse borderline residual CAD consistent with uncontrolled diabetes, medical management recommended   CORONARY ANGIOPLASTY WITH STENT PLACEMENT  01/09/2013   30% pLAD, 30% mLAD, 70% pLCx, RCA occlusion at crux with R->L distal collaterals s/p DES; LVEF 50%, moderate-severe HK of inferior basal wall   EYE SURGERY Bilateral    LEFT HEART CATHETERIZATION WITH CORONARY ANGIOGRAM N/A 01/09/2013   Procedure: LEFT HEART CATHETERIZATION WITH CORONARY ANGIOGRAM;  Surgeon: Laterrian Hevener M Martin[place], MD;  Location: [facility] CATH LAB;  Service: Cardiovascular;  Laterality: N/A;   LEFT HEART CATHETERIZATION WITH CORONARY ANGIOGRAM N/A 01/22/2013   Procedure: LEFT HEART CATHETERIZATION WITH CORONARY ANGIOGRAM;  Surgeon: Sherren Mocha, MD;  Location: [facility] CATH LAB;  Service: Cardiovascular;  Laterality: N/A;    Social History   Tobacco Use  Smoking Status Never  Smokeless Tobacco Never    Social History   Substance and Sexual Activity  Alcohol Use Yes   Comment: 4 times per year-wine or beer    Family History  Problem Relation Age of Onset   Heart disease Mother    Heart disease Father    Heart disease Brother    Heart disease Brother    Colon cancer Neg Hx    Colon polyps Neg Hx     Review of Systems: As noted in history of present illness.  All other systems were reviewed and are negative.  Physical Exam: BP 120/62 (BP Location: Left Arm, Patient Position: Sitting, Cuff Size: Normal)   Pulse 62   Ht '5\' 6"'  (1.676 m)   Wt 183 lb 12.8 oz (83.4 kg)   SpO2 98%   BMI 29.67 kg/m  GENERAL:  Well appearing male in NAD HEENT:  PERRL, EOMI, sclera are clear. Oropharynx is clear. NECK:   No jugular venous distention, carotid upstroke brisk and symmetric, no bruits, no thyromegaly or adenopathy LUNGS:  Clear to auscultation bilaterally CHEST:  Unremarkable HEART:  RRR,  PMI not displaced or sustained,S1 and S2 within normal limits, no S3, no S4: no clicks, no rubs, no murmurs ABD:  Soft, nontender. BS +, no masses or bruits. No hepatomegaly, no splenomegaly EXT:  2 + pulses throughout, no edema, no cyanosis no clubbing SKIN:  Warm and dry.  No rashes NEURO:  Alert and oriented x 3. Cranial nerves II through XII intact. PSYCH:  Cognitively intact  LABORATORY DATA: Lab Results  Component Value Date   WBC 7.7 10/26/2020   HGB 13.0 10/26/2020   HCT 38.5 10/26/2020   PLT 203 10/26/2020   GLUCOSE 110 (H) 04/13/2021   CHOL 106 10/26/2020   TRIG 79 10/26/2020   HDL 40 10/26/2020   LDLCALC 50 10/26/2020   ALT 12 04/13/2021   AST 10 04/13/2021   NA 141 04/13/2021   K 5.1 04/13/2021   CL 104 04/13/2021   CREATININE 1.22 04/13/2021   BUN 16 04/13/2021   CO2 23 04/13/2021   TSH 2.080 01/23/2021   PSA 0.6 09/22/2014   INR 1.16 01/21/2013   HGBA1C 7.2 (H) 01/23/2021   MICROALBUR neg 11/02/2014    Myoview 06/26/16:Study Highlights     The left ventricular ejection fraction is mildly decreased (45-54%). Nuclear stress EF: 48%. Blood pressure demonstrated a hypertensive response to exercise. There was no ST segment deviation noted during stress. Findings consistent with prior myocardial infarction. This is an intermediate risk study.   Small inferobasal wall infarct no ischemia EF 48%    Myoview 11/03/20: Study Highlights    Nuclear stress EF: 53%. No wall motion abnormalities. The left ventricular ejection fraction is mildly decreased (45-54%). There was no ST segment deviation noted during stress. Defect 1: There is a medium defect of moderate severity present in the basal inferior and mid inferior location. Findings consistent with prior myocardial  infarction inferior wall. There is no significant ischemia identified. This is an intermediate risk study.   Candee Furbish, MD  Assessment / Plan: 1. Coronary disease status post inferior STEMI 2014 treated with DES to the RCA. Repeat cardiac catheterization in 2014 demonstrated continued patency. Moderate diffuse disease in the left circumflex. No ischemia by [facility] September 2017. Recent Myoview in January unchanged.    He is having some atypical chest pain that he has had for years.  We will continue aspirin and statin.   2. Diabetes mellitus: On metformin, glipizide, and insulin . Followed by primary care. Last A1c 7.2%.   3. Dyslipidemia. On statin therapy. Excellent control.   4. Hypertension, BP good control on current medications  5. Diabetic retinopathy and neuropathy. Now legally blind.   6. Chronic subdural Hematoma. Seen by Neurosurgery. No additional therapy needed.   Follow up in 6 months.

## 2021-04-20 ENCOUNTER — Ambulatory Visit: Payer: PPO | Admitting: Cardiology

## 2021-04-20 ENCOUNTER — Other Ambulatory Visit: Payer: Self-pay

## 2021-04-20 ENCOUNTER — Encounter: Payer: Self-pay | Admitting: Cardiology

## 2021-04-20 VITALS — BP 120/62 | HR 62 | Ht 66.0 in | Wt 183.8 lb

## 2021-04-20 DIAGNOSIS — Z794 Long term (current) use of insulin: Secondary | ICD-10-CM

## 2021-04-20 DIAGNOSIS — I25118 Atherosclerotic heart disease of native coronary artery with other forms of angina pectoris: Secondary | ICD-10-CM

## 2021-04-20 DIAGNOSIS — I10 Essential (primary) hypertension: Secondary | ICD-10-CM

## 2021-04-20 DIAGNOSIS — E0840 Diabetes mellitus due to underlying condition with diabetic neuropathy, unspecified: Secondary | ICD-10-CM | POA: Diagnosis not present

## 2021-04-20 DIAGNOSIS — E782 Mixed hyperlipidemia: Secondary | ICD-10-CM

## 2021-04-24 ENCOUNTER — Ambulatory Visit (INDEPENDENT_AMBULATORY_CARE_PROVIDER_SITE_OTHER): Payer: PPO | Admitting: Family Medicine

## 2021-04-24 ENCOUNTER — Other Ambulatory Visit: Payer: Self-pay | Admitting: Family Medicine

## 2021-04-24 ENCOUNTER — Other Ambulatory Visit: Payer: Self-pay

## 2021-04-24 ENCOUNTER — Encounter: Payer: Self-pay | Admitting: Family Medicine

## 2021-04-24 VITALS — BP 134/78 | HR 78 | Ht 66.0 in | Wt 183.0 lb

## 2021-04-24 DIAGNOSIS — I10 Essential (primary) hypertension: Secondary | ICD-10-CM | POA: Diagnosis not present

## 2021-04-24 DIAGNOSIS — E1159 Type 2 diabetes mellitus with other circulatory complications: Secondary | ICD-10-CM | POA: Diagnosis not present

## 2021-04-24 DIAGNOSIS — E0840 Diabetes mellitus due to underlying condition with diabetic neuropathy, unspecified: Secondary | ICD-10-CM | POA: Diagnosis not present

## 2021-04-24 DIAGNOSIS — E782 Mixed hyperlipidemia: Secondary | ICD-10-CM

## 2021-04-24 DIAGNOSIS — E11319 Type 2 diabetes mellitus with unspecified diabetic retinopathy without macular edema: Secondary | ICD-10-CM

## 2021-04-24 DIAGNOSIS — E039 Hypothyroidism, unspecified: Secondary | ICD-10-CM | POA: Diagnosis not present

## 2021-04-24 LAB — BAYER DCA HB A1C WAIVED: HB A1C (BAYER DCA - WAIVED): 7 % — ABNORMAL HIGH (ref <7.0)

## 2021-04-24 MED ORDER — FUROSEMIDE 20 MG PO TABS: 20.0000 mg | ORAL_TABLET | Freq: Every day | ORAL | 3 refills | Status: DC | PRN

## 2021-04-24 MED ORDER — ONETOUCH VERIO VI STRP: ORAL_STRIP | 3 refills | Status: DC

## 2021-04-24 NOTE — Addendum Note (Signed)
Addended by: Julious Payer D on: 04/24/2021 05:03 PM   Modules accepted: Orders

## 2021-04-24 NOTE — Progress Notes (Signed)
BP 134/78   Pulse 78   Ht _0  (1.676 m)   Wt 183 lb (83 kg)   SpO2 97%   BMI 29.54 kg/m    Subjective:   Patient ID: Ethan Peters, male    DOB: 1956/07/20, 65 y.o.   MRN: [id number]  HPI: Ethan Peters is a 65 y.o. male presenting on 04/24/2021 for Medical Management of Chronic Issues and Diabetes   HPI Type 2 diabetes mellitus Patient comes in today for recheck of his diabetes. Patient has been currently taking glipizide and metformin and NovoLog 70/30. Patient is currently on an ACE inhibitor/ARB. Patient has not seen an ophthalmologist this year. Patient denies any issues with their feet. The symptom started onset as an adult hypertension hypothyroidism and hyperlipidemia ARE RELATED TO DM   Hypertension Patient is currently on candesartan, and their blood pressure today is 134/78. Patient denies any lightheadedness or dizziness. Patient denies headaches, blurred vision, chest pains, shortness of breath, or weakness. Denies any side effects from medication and is content with current medication. .  Hypothyroidism recheck Patient is coming in for thyroid recheck today as well. They deny any issues with hair changes or heat or cold problems or diarrhea or constipation. They deny any chest pain or palpitations. They are currently on levothyroxine 50 micrograms   Hyperlipidemia Patient is coming in for recheck of his hyperlipidemia. The patient is currently taking Lipitor. They deny any issues with myalgias or history of liver damage from it. They deny any focal numbness or weakness or chest pain.  Patient's leg swelling is much improved with a little bit on his right leg has been doing well with the furosemide and wants refill for it.   Relevant past medical, surgical, family and social history reviewed and updated as indicated. Interim medical history since our last visit reviewed. Allergies and medications reviewed and updated.  Review of Systems  Constitutional:   Negative for chills and fever.  Respiratory:  Negative for shortness of breath and wheezing.   Cardiovascular:  Positive for leg swelling. Negative for chest pain and palpitations.  Musculoskeletal:  Negative for back pain and gait problem.  Skin:  Negative for rash.  Neurological:  Negative for dizziness, weakness and light-headedness.  All other systems reviewed and are negative.  Per HPI unless specifically indicated above   Allergies as of 04/24/2021       Reactions   Levemir [insulin Detemir] Swelling   Lisinopril Cough   Trazodone And Nefazodone    Penicillins Rash   Lip swelling        Medication List        Accurate as of April 24, 2021 11:20 AM. If you have any questions, ask your nurse or doctor.          allopurinol 300 MG tablet Commonly known as: ZYLOPRIM TAKE ONE TABLET BY MOUTH ONCE DAILY AS NEEDED FOR GOUT prevention   Alphagan P 0.1 % Soln Generic drug: brimonidine Apply 1 drop to eye 3 (three) times daily.   aspirin 81 MG EC tablet Take 1 tablet (81 mg total) by mouth daily.   atorvastatin 80 MG tablet Commonly known as: LIPITOR TAKE 1 TABLET BY MOUTH ONCE DAILY AT 6 PM FOR CHOLESTEROL   B-complex with vitamin C tablet Take 1 tablet by mouth as needed (for vitamin deficiency).   candesartan 16 MG tablet Commonly known as: ATACAND Take 1 tablet (16 mg total) by mouth in the morning and at bedtime.  cholecalciferol 25 MCG (1000 UNIT) tablet Commonly known as: VITAMIN D3 Take 1,000 Units by mouth daily.   dorzolamide-timolol 22.3-6.8 MG/ML ophthalmic solution Commonly known as: COSOPT Place 1 drop into the right eye 2 (two) times daily.   furosemide 20 MG tablet Commonly known as: LASIX Take 1 tablet (20 mg total) by mouth daily as needed. Started by: Fransisca Kaufmann Lonni Dirden, MD   glipiZIDE 5 MG tablet Commonly known as: GLUCOTROL Take 1 tablet (5 mg total) by mouth daily before breakfast.   glucose blood test strip Use as instructed  to test blood sugar up to 6 times daily. DX: E11.9   insulin aspart protamine- aspart (70-30) 100 UNIT/ML injection Commonly known as: NovoLOG Mix 70/30 44 units before breakfast and 40 units before supper daily   latanoprost 0.005 % ophthalmic solution Commonly known as: XALATAN Place 1 drop into both eyes at bedtime.   levothyroxine 50 MCG tablet Commonly known as: SYNTHROID Take 1 tablet (50 mcg total) by mouth daily.   meloxicam 15 MG tablet Commonly known as: MOBIC Take 1 tablet by mouth daily.   metFORMIN 1000 MG tablet Commonly known as: GLUCOPHAGE Take 1 tablet (1,000 mg total) by mouth 2 (two) times daily with a meal.   nitroGLYCERIN 0.4 MG SL tablet Commonly known as: NITROSTAT Place 1 tablet (0.4 mg total) under the tongue every 5 (five) minutes x 3 doses as needed for chest pain.   Prodigy Voice Blood Glucose w/Device Kit Use to test blood sugar up to 6 times daily. DX:E11.9 Patient needs talking glucometer   tamsulosin 0.4 MG Caps capsule Commonly known as: FLOMAX Take 0.4 mg by mouth daily.         Objective:   BP 134/78   Pulse 78   Ht _0  (1.676 m)   Wt 183 lb (83 kg)   SpO2 97%   BMI 29.54 kg/m   Wt Readings from Last 3 Encounters:  04/24/21 183 lb (83 kg)  04/20/21 183 lb 12.8 oz (83.4 kg)  04/13/21 191 lb (86.6 kg)    Physical Exam Vitals and nursing note reviewed.  Constitutional:      General: He is not in acute distress.    Appearance: He is well-developed. He is not diaphoretic.  Eyes:     General: No scleral icterus.    Conjunctiva/sclera: Conjunctivae normal.  Neck:     Thyroid: No thyromegaly.  Cardiovascular:     Rate and Rhythm: Normal rate and regular rhythm.     Heart sounds: Normal heart sounds. No murmur heard. Pulmonary:     Effort: Pulmonary effort is normal. No respiratory distress.     Breath sounds: Normal breath sounds. No wheezing.  Musculoskeletal:        General: No swelling (No swelling noted on exam).  Normal range of motion.     Cervical back: Neck supple.  Lymphadenopathy:     Cervical: No cervical adenopathy.  Skin:    General: Skin is warm and dry.     Findings: No rash.  Neurological:     Mental Status: He is alert and oriented to person, place, and time.     Coordination: Coordination normal.  Psychiatric:        Behavior: Behavior normal.      Assessment & Plan:   Problem List Items Addressed This Visit       Cardiovascular and Mediastinum   DM type 2 causing vascular disease ([place])   Relevant Medications   furosemide (LASIX)  20 MG tablet   Essential hypertension   Relevant Medications   furosemide (LASIX) 20 MG tablet     Endocrine   Diabetes mellitus due to underlying condition with diabetic neuropathy ([place]) - Primary   Relevant Orders   Bayer DCA Hb A1c Waived   Hypothyroidism   Diabetic retinopathy of both eyes associated with type 2 diabetes mellitus ([place])     Other   Mixed hyperlipidemia   Relevant Medications   furosemide (LASIX) 20 MG tablet    Continue current medication, A1c is 7.0.  Did give him some furosemide and recommend use sparingly only when needed not every day.  He also has meloxicam and recommended using that sparingly as well not every day. Follow up plan: Return in about 3 months (around 07/25/2021), or if symptoms worsen or fail to improve, for Diabetes and hypertension recheck.  Counseling provided for all of the vaccine components Orders Placed This Encounter  Procedures   Bayer Dewart Hb A1c [place] Timoty Bourke, MD [facility] 04/24/2021, 11:20 AM

## 2021-04-24 NOTE — Telephone Encounter (Signed)
Refill failed, resen

## 2021-05-01 DIAGNOSIS — E113553 Type 2 diabetes mellitus with stable proliferative diabetic retinopathy, bilateral: Secondary | ICD-10-CM | POA: Diagnosis not present

## 2021-05-01 DIAGNOSIS — H40043 Steroid responder, bilateral: Secondary | ICD-10-CM | POA: Diagnosis not present

## 2021-05-01 DIAGNOSIS — Z7984 Long term (current) use of oral hypoglycemic drugs: Secondary | ICD-10-CM | POA: Diagnosis not present

## 2021-05-01 DIAGNOSIS — H4053X3 Glaucoma secondary to other eye disorders, bilateral, severe stage: Secondary | ICD-10-CM | POA: Diagnosis not present

## 2021-05-01 DIAGNOSIS — Z79899 Other long term (current) drug therapy: Secondary | ICD-10-CM | POA: Diagnosis not present

## 2021-06-06 DIAGNOSIS — N411 Chronic prostatitis: Secondary | ICD-10-CM | POA: Diagnosis not present

## 2021-06-06 DIAGNOSIS — N5201 Erectile dysfunction due to arterial insufficiency: Secondary | ICD-10-CM | POA: Diagnosis not present

## 2021-06-12 DIAGNOSIS — Z961 Presence of intraocular lens: Secondary | ICD-10-CM | POA: Diagnosis not present

## 2021-06-12 DIAGNOSIS — E113553 Type 2 diabetes mellitus with stable proliferative diabetic retinopathy, bilateral: Secondary | ICD-10-CM | POA: Diagnosis not present

## 2021-06-12 DIAGNOSIS — Z7984 Long term (current) use of oral hypoglycemic drugs: Secondary | ICD-10-CM | POA: Diagnosis not present

## 2021-06-12 DIAGNOSIS — Z79899 Other long term (current) drug therapy: Secondary | ICD-10-CM | POA: Diagnosis not present

## 2021-06-12 DIAGNOSIS — H4053X3 Glaucoma secondary to other eye disorders, bilateral, severe stage: Secondary | ICD-10-CM | POA: Diagnosis not present

## 2021-07-20 ENCOUNTER — Other Ambulatory Visit: Payer: Self-pay | Admitting: Family Medicine

## 2021-07-26 ENCOUNTER — Ambulatory Visit (INDEPENDENT_AMBULATORY_CARE_PROVIDER_SITE_OTHER): Payer: PPO | Admitting: Family Medicine

## 2021-07-26 ENCOUNTER — Other Ambulatory Visit: Payer: Self-pay | Admitting: Family Medicine

## 2021-07-26 ENCOUNTER — Other Ambulatory Visit: Payer: Self-pay

## 2021-07-26 ENCOUNTER — Encounter: Payer: Self-pay | Admitting: Family Medicine

## 2021-07-26 VITALS — BP 157/78 | HR 60 | Ht 66.0 in | Wt 188.0 lb

## 2021-07-26 DIAGNOSIS — I25118 Atherosclerotic heart disease of native coronary artery with other forms of angina pectoris: Secondary | ICD-10-CM | POA: Diagnosis not present

## 2021-07-26 DIAGNOSIS — E119 Type 2 diabetes mellitus without complications: Secondary | ICD-10-CM

## 2021-07-26 DIAGNOSIS — E1159 Type 2 diabetes mellitus with other circulatory complications: Secondary | ICD-10-CM

## 2021-07-26 DIAGNOSIS — E039 Hypothyroidism, unspecified: Secondary | ICD-10-CM

## 2021-07-26 DIAGNOSIS — E782 Mixed hyperlipidemia: Secondary | ICD-10-CM | POA: Diagnosis not present

## 2021-07-26 DIAGNOSIS — E1342 Other specified diabetes mellitus with diabetic polyneuropathy: Secondary | ICD-10-CM | POA: Diagnosis not present

## 2021-07-26 DIAGNOSIS — Z23 Encounter for immunization: Secondary | ICD-10-CM | POA: Diagnosis not present

## 2021-07-26 DIAGNOSIS — E11319 Type 2 diabetes mellitus with unspecified diabetic retinopathy without macular edema: Secondary | ICD-10-CM | POA: Diagnosis not present

## 2021-07-26 DIAGNOSIS — I10 Essential (primary) hypertension: Secondary | ICD-10-CM

## 2021-07-26 LAB — CMP14+EGFR
ALT: 9 IU/L (ref 0–44)
AST: 15 IU/L (ref 0–40)
Albumin/Globulin Ratio: 1.8 (ref 1.2–2.2)
Albumin: 4.8 g/dL (ref 3.8–4.8)
Alkaline Phosphatase: 80 IU/L (ref 44–121)
BUN/Creatinine Ratio: 13 (ref 10–24)
BUN: 15 mg/dL (ref 8–27)
Bilirubin Total: 0.4 mg/dL (ref 0.0–1.2)
CO2: 25 mmol/L (ref 20–29)
Calcium: 9.8 mg/dL (ref 8.6–10.2)
Chloride: 102 mmol/L (ref 96–106)
Creatinine, Ser: 1.16 mg/dL (ref 0.76–1.27)
Globulin, Total: 2.7 g/dL (ref 1.5–4.5)
Glucose: 240 mg/dL — ABNORMAL HIGH (ref 70–99)
Potassium: 5 mmol/L (ref 3.5–5.2)
Sodium: 139 mmol/L (ref 134–144)
Total Protein: 7.5 g/dL (ref 6.0–8.5)
eGFR: 70 mL/min/{1.73_m2} (ref >59)

## 2021-07-26 LAB — CBC WITH DIFFERENTIAL/PLATELET
Basophils Absolute: 0.1 10*3/uL (ref 0.0–0.2)
Basos: 1 %
EOS (ABSOLUTE): 0.3 10*3/uL (ref 0.0–0.4)
Eos: 5 %
Hematocrit: 41.6 % (ref 37.5–51.0)
Hemoglobin: 13.8 g/dL (ref 13.0–17.7)
Immature Grans (Abs): 0 10*3/uL (ref 0.0–0.1)
Immature Granulocytes: 0 %
Lymphocytes Absolute: 3 10*3/uL (ref 0.7–3.1)
Lymphs: 45 %
MCH: 31.7 pg (ref 26.6–33.0)
MCHC: 33.2 g/dL (ref 31.5–35.7)
MCV: 95 fL (ref 79–97)
Monocytes Absolute: 0.5 10*3/uL (ref 0.1–0.9)
Monocytes: 8 %
Neutrophils Absolute: 2.7 10*3/uL (ref 1.4–7.0)
Neutrophils: 41 %
Platelets: 204 10*3/uL (ref 150–450)
RBC: 4.36 x10E6/uL (ref 4.14–5.80)
RDW: 12.3 % (ref 11.6–15.4)
WBC: 6.6 10*3/uL (ref 3.4–10.8)

## 2021-07-26 LAB — LIPID PANEL
Chol/HDL Ratio: 2.9 ratio (ref 0.0–5.0)
Cholesterol, Total: 114 mg/dL (ref 100–199)
HDL: 39 mg/dL — ABNORMAL LOW (ref >39)
LDL Chol Calc (NIH): 47 mg/dL (ref 0–99)
Triglycerides: 167 mg/dL — ABNORMAL HIGH (ref 0–149)
VLDL Cholesterol Cal: 28 mg/dL (ref 5–40)

## 2021-07-26 LAB — BAYER DCA HB A1C WAIVED: HB A1C (BAYER DCA - WAIVED): 6.3 % — ABNORMAL HIGH (ref 4.8–5.6)

## 2021-07-26 LAB — TSH: TSH: 3.36 u[IU]/mL (ref 0.450–4.500)

## 2021-07-26 MED ORDER — BRIMONIDINE TARTRATE 0.2 % OP SOLN: 1.0000 [drp] | Freq: Three times a day (TID) | OPHTHALMIC | 3 refills | Status: DC

## 2021-07-26 NOTE — Progress Notes (Signed)
BP (!) 157/78   Pulse 60   Ht _0  (1.676 m)   Wt 188 lb (85.3 kg)   SpO2 98%   BMI 30.34 kg/m    Subjective:   Patient ID: Ethan Peters, male    DOB: 12-28-1955, 65 y.o.   MRN: [id number]  HPI: Ethan Peters is a 65 y.o. male presenting on 07/26/2021 for Medical Management of Chronic Issues and Diabetes   HPI Type 2 diabetes mellitus Patient comes in today for recheck of his diabetes. Patient has been currently taking glipizide and metformin and Novolin 70/30, A1c is better at 6.3. Patient is currently on an ACE inhibitor/ARB. Patient has not seen an ophthalmologist this year, is already legally blind. Patient denies any issues with their feet. The symptom started onset as an adult CAD and vascular disease and neuropathy and retinopathy with blindness ARE RELATED TO DM   Hypertension Patient is currently on furosemide and candesartan, and their blood pressure today is 157/78 but admits that he did not take his medicine this morning but he said yesterday he checked and it was 137/77. Patient denies any lightheadedness or dizziness. Patient denies headaches, blurred vision, chest pains, shortness of breath, or weakness. Denies any side effects from medication and is content with current medication.   Hyperlipidemia Patient is coming in for recheck of his hyperlipidemia. The patient is currently taking atorvastatin. They deny any issues with myalgias or history of liver damage from it. They deny any focal numbness or weakness or chest pain.   Hypothyroidism recheck Patient is coming in for thyroid recheck today as well. They deny any issues with hair changes or heat or cold problems or diarrhea or constipation. They deny any chest pain or palpitations. They are currently on levothyroxine 50 micrograms   Relevant past medical, surgical, family and social history reviewed and updated as indicated. Interim medical history since our last visit reviewed. Allergies and medications  reviewed and updated.  Review of Systems  Constitutional:  Negative for chills and fever.  Eyes:  Positive for visual disturbance.  Respiratory:  Negative for shortness of breath and wheezing.   Cardiovascular:  Negative for chest pain and leg swelling.  Musculoskeletal:  Negative for back pain and gait problem.  Skin:  Negative for rash.  Neurological:  Negative for dizziness, weakness, light-headedness and headaches.  All other systems reviewed and are negative.  Per HPI unless specifically indicated above   Allergies as of 07/26/2021       Reactions   Levemir [insulin Detemir] Swelling   Lisinopril Cough   Trazodone And Nefazodone    Penicillins Rash   Lip swelling        Medication List        Accurate as of July 26, 2021  9:42 AM. If you have any questions, ask your nurse or doctor.          allopurinol 300 MG tablet Commonly known as: ZYLOPRIM TAKE ONE TABLET BY MOUTH ONCE DAILY AS NEEDED FOR GOUT prevention   Alphagan P 0.1 % Soln Generic drug: brimonidine Apply 1 drop to eye 3 (three) times daily.   aspirin 81 MG EC tablet Take 1 tablet (81 mg total) by mouth daily.   atorvastatin 80 MG tablet Commonly known as: LIPITOR TAKE 1 TABLET BY MOUTH ONCE DAILY AT 6 PM FOR CHOLESTEROL   B-complex with vitamin C tablet Take 1 tablet by mouth as needed (for vitamin deficiency).   candesartan 16 MG tablet Commonly  known as: ATACAND Take 1 tablet (16 mg total) by mouth in the morning and at bedtime.   cholecalciferol 25 MCG (1000 UNIT) tablet Commonly known as: VITAMIN D3 Take 1,000 Units by mouth daily.   dorzolamide-timolol 22.3-6.8 MG/ML ophthalmic solution Commonly known as: COSOPT Place 1 drop into the right eye 2 (two) times daily.   furosemide 20 MG tablet Commonly known as: LASIX TAKE 1 TABLET BY MOUTH EVERY DAY AS NEEDED   glipiZIDE 5 MG tablet Commonly known as: GLUCOTROL Take 1 tablet (5 mg total) by mouth daily before breakfast.    insulin aspart protamine- aspart (70-30) 100 UNIT/ML injection Commonly known as: NovoLOG Mix 70/30 44 units before breakfast and 40 units before supper daily   latanoprost 0.005 % ophthalmic solution Commonly known as: XALATAN Place 1 drop into both eyes at bedtime.   levothyroxine 50 MCG tablet Commonly known as: SYNTHROID Take 1 tablet (50 mcg total) by mouth daily.   meloxicam 15 MG tablet Commonly known as: MOBIC Take 1 tablet by mouth daily.   metFORMIN 1000 MG tablet Commonly known as: GLUCOPHAGE Take 1 tablet (1,000 mg total) by mouth 2 (two) times daily with a meal.   nitroGLYCERIN 0.4 MG SL tablet Commonly known as: NITROSTAT Place 1 tablet (0.4 mg total) under the tongue every 5 (five) minutes x 3 doses as needed for chest pain.   OneTouch Verio test strip Generic drug: glucose blood USE 1 STRIP TO CHECK GLUCOSE 4 TIMES DAILY DX E13.42   Prodigy Voice Blood Glucose w/Device Kit Use to test blood sugar up to 6 times daily. DX:E11.9 Patient needs talking glucometer   tamsulosin 0.4 MG Caps capsule Commonly known as: FLOMAX Take 0.4 mg by mouth daily.         Objective:   BP (!) 157/78   Pulse 60   Ht _0  (1.676 m)   Wt 188 lb (85.3 kg)   SpO2 98%   BMI 30.34 kg/m   Wt Readings from Last 3 Encounters:  07/26/21 188 lb (85.3 kg)  04/24/21 183 lb (83 kg)  04/20/21 183 lb 12.8 oz (83.4 kg)    Physical Exam Vitals and nursing note reviewed.  Constitutional:      General: He is not in acute distress.    Appearance: He is well-developed. He is not diaphoretic.  Eyes:     General: No scleral icterus.    Conjunctiva/sclera: Conjunctivae normal.  Neck:     Thyroid: No thyromegaly.  Cardiovascular:     Rate and Rhythm: Normal rate and regular rhythm.     Heart sounds: Normal heart sounds. No murmur heard. Pulmonary:     Effort: Pulmonary effort is normal. No respiratory distress.     Breath sounds: Normal breath sounds. No wheezing.   Musculoskeletal:        General: Normal range of motion.     Cervical back: Neck supple.  Lymphadenopathy:     Cervical: No cervical adenopathy.  Skin:    General: Skin is warm and dry.     Findings: No rash.  Neurological:     Mental Status: He is alert and oriented to person, place, and time.     Coordination: Coordination normal.  Psychiatric:        Behavior: Behavior normal.      Assessment & Plan:   Problem List Items Addressed This Visit       Cardiovascular and Mediastinum   DM type 2 causing vascular disease ([place])  Essential hypertension   Relevant Orders   CBC with Differential/Platelet   CMP14+EGFR   Lipid panel   TSH   Bayer DCA Hb A1c Waived   CAD (coronary artery disease), native coronary artery     Endocrine   Diabetic neuropathy (HCC)   Hypothyroidism   Relevant Orders   CBC with Differential/Platelet   CMP14+EGFR   Lipid panel   TSH   Bayer DCA Hb A1c Waived   Diabetic retinopathy of both eyes associated with type 2 diabetes mellitus ([place])     Other   Mixed hyperlipidemia - Primary   Relevant Orders   CBC with Differential/Platelet   CMP14+EGFR   Lipid panel   TSH   Bayer DCA Hb A1c Waived   Other Visit Diagnoses     Type 2 diabetes mellitus without complication, without long-term current use of insulin (HCC)       Relevant Orders   CBC with Differential/Platelet   CMP14+EGFR   Lipid panel   TSH   Bayer DCA Hb A1c Waived       A1c is 6.3 looks good, continue current medicine.  Refilled glaucoma medicine For his eyes. Follow up plan: Return in about 6 months (around 01/24/2022), or if symptoms worsen or fail to improve, for Diabetes recheck when returns from [place].  Counseling provided for all of the vaccine components Orders Placed This Encounter  Procedures   CBC with Differential/Platelet   CMP14+EGFR   Lipid panel   TSH   Bayer DCA Hb A1c [place], MD [place]  [facility] 07/26/2021, 9:42 AM

## 2021-08-05 ENCOUNTER — Other Ambulatory Visit: Payer: Self-pay | Admitting: Family Medicine

## 2021-10-16 ENCOUNTER — Other Ambulatory Visit: Payer: Self-pay | Admitting: Family Medicine

## 2022-01-15 ENCOUNTER — Ambulatory Visit (INDEPENDENT_AMBULATORY_CARE_PROVIDER_SITE_OTHER): Payer: PPO

## 2022-01-15 VITALS — Ht 66.0 in | Wt 188.0 lb

## 2022-01-15 DIAGNOSIS — H547 Unspecified visual loss: Secondary | ICD-10-CM | POA: Insufficient documentation

## 2022-01-15 DIAGNOSIS — Z0000 Encounter for general adult medical examination without abnormal findings: Secondary | ICD-10-CM

## 2022-01-15 DIAGNOSIS — Z1211 Encounter for screening for malignant neoplasm of colon: Secondary | ICD-10-CM

## 2022-01-15 DIAGNOSIS — N529 Male erectile dysfunction, unspecified: Secondary | ICD-10-CM | POA: Insufficient documentation

## 2022-01-15 NOTE — Progress Notes (Signed)
? ?Subjective:  ? Ethan Peters is a 66 y.o. male who presents for Medicare Annual/Subsequent preventive examination. ?Virtual Visit via Telephone Note ? ?I connected with  Ethan Peters on 01/15/22 at  1:15 PM EDT by telephone and verified that I am speaking with the correct person using two identifiers. ? ?Location: ?Patient: HOME ?Provider: [facility] ?Persons participating in the virtual visit: patient/Nurse Health Advisor ?  ?I discussed the limitations, risks, security and privacy concerns of performing an evaluation and management service by telephone and the availability of in person appointments. The patient expressed understanding and agreed to proceed. ? ?Interactive audio and video telecommunications were attempted between this nurse and patient, however failed, due to patient having technical difficulties OR patient did not have access to video capability.  We continued and completed visit with audio only. ? ?Some vital signs may be absent or patient reported.  ? ?Chriss Driver, LPN ? ?Review of Systems    ? ?Cardiac Risk Factors include: advanced age (>55mn, >>3women);diabetes mellitus;hypertension;dyslipidemia;male gender;sedentary lifestyle;obesity (BMI >30kg/m2);Other (see comment), Risk factor comments: Blindness ? ?   ?Objective:  ?  ?Today's Vitals  ? 01/15/22 1318  ?Weight: 188 lb (85.3 kg)  ?Height: '5\' 6"'  (1.676 m)  ? ?Body mass index is 30.34 kg/m?. ? ? ?  01/15/2022  ?  1:27 PM 11/23/2020  ?  1:24 PM 11/20/2019  ?  1:58 PM 11/18/2018  ?  5:23 PM 11/18/2018  ?  2:45 PM 01/21/2013  ?  6:06 PM 01/10/2013  ?  3:00 AM  ?Advanced Directives  ?Does Patient Have a Medical Advance Directive? No No No  No Patient does not have advance directive Patient does not have advance directive  ?Would patient like information on creating a medical advance directive? No - Patient declined No - Patient declined No - Patient declined Yes (MAU/Ambulatory/Procedural Areas - Information given) No - Patient declined     ? ? ?Current Medications (verified) ?Outpatient Encounter Medications as of 01/15/2022  ?Medication Sig  ? [DISCONTINUED] brimonidine (ALPHAGAN) 0.2 % ophthalmic solution Place 1 drop into both eyes 2 times daily.  ? allopurinol (ZYLOPRIM) 300 MG tablet TAKE ONE TABLET BY MOUTH ONCE DAILY AS NEEDED FOR GOUT prevention  ? aspirin EC 81 MG EC tablet Take 1 tablet (81 mg total) by mouth daily.  ? atorvastatin (LIPITOR) 80 MG tablet TAKE 1 TABLET BY MOUTH ONCE DAILY AT 6 PM FOR CHOLESTEROL  ? B Complex-C (B-COMPLEX WITH VITAMIN C) tablet Take 1 tablet by mouth as needed (for vitamin deficiency).  ? Blood Glucose Monitoring Suppl (PRODIGY VOICE BLOOD GLUCOSE) w/Device KIT Use to test blood sugar up to 6 times daily. DX:E11.9 Patient needs talking glucometer  ? brimonidine (ALPHAGAN) 0.2 % ophthalmic solution Place 1 drop into both eyes 3 (three) times daily. Give 90-day supply at a time  ? candesartan (ATACAND) 16 MG tablet Take 1 tablet (16 mg total) by mouth in the morning and at bedtime.  ? cholecalciferol (VITAMIN D3) 25 MCG (1000 UNIT) tablet Take 1,000 Units by mouth daily.  ? dorzolamide-timolol (COSOPT) 22.3-6.8 MG/ML ophthalmic solution 1 DROP BOTH EYES TWICE A DAY. SEPARATE BY AT LEAST 10 MINUTES FROM OTHER PRESSURE REDUCING EYE DROPS  ? furosemide (LASIX) 20 MG tablet TAKE 1 TABLET BY MOUTH EVERY DAY AS NEEDED  ? glipiZIDE (GLUCOTROL) 5 MG tablet Take 1 tablet (5 mg total) by mouth daily before breakfast.  ? glucose blood (ONETOUCH VERIO) test strip USE 1 STRIP TO  CHECK GLUCOSE 4 TIMES DAILY DX E13.42  ? insulin aspart protamine- aspart (NOVOLOG MIX 70/30) (70-30) 100 UNIT/ML injection 44 units before breakfast and 40 units before supper daily  ? latanoprost (XALATAN) 0.005 % ophthalmic solution INSTILL 1 DROP IN BOTH EYES EVERY EVENING  ? levothyroxine (SYNTHROID) 50 MCG tablet Take 1 tablet (50 mcg total) by mouth daily.  ? meloxicam (MOBIC) 15 MG tablet Take 1 tablet by mouth daily.  ? metFORMIN (GLUCOPHAGE)  1000 MG tablet Take 1 tablet (1,000 mg total) by mouth 2 (two) times daily with a meal.  ? nitroGLYCERIN (NITROSTAT) 0.4 MG SL tablet Place 1 tablet (0.4 mg total) under the tongue every 5 (five) minutes x 3 doses as needed for chest pain.  ? tamsulosin (FLOMAX) 0.4 MG CAPS capsule Take 0.4 mg by mouth daily.  ? ?No facility-administered encounter medications on file as of 01/15/2022.  ? ? ?Allergies (verified) ?Levemir [insulin detemir], Lisinopril, Trazodone and nefazodone, and Penicillins  ? ?History: ?Past Medical History:  ?Diagnosis Date  ? CAD (coronary artery disease) 01/09/13  ? Inferior STEMI s/p DES-RCA  ? Cataract   ? Diabetes mellitus   ? Uncontrolled, Hgb A1C 14.8% on 03/14, started on insulin  ? Gout   ? Hyperlipidemia   ? Hypertension   ? Ischemic cardiomyopathy   ? EF 95-18%, grade 1 diastolic dysfunction, mildly dilated RV, RA at the upper limits of normal, mild TR, PA systolic pressure 32 mm mercury and hypokinesis to akinesis of the basal mid inferior myocardium  ? Myocardial infarction ([place]) 01/09/2013  ? Shortness of breath   ? Thyroid disease   ? ?Past Surgical History:  ?Procedure Laterality Date  ? CARDIAC CATHETERIZATION  01/22/2013  ? Diffuse borderline residual CAD consistent with uncontrolled diabetes, medical management recommended  ? CORONARY ANGIOPLASTY WITH STENT PLACEMENT  01/09/2013  ? 30% pLAD, 30% mLAD, 70% pLCx, RCA occlusion at crux with R->L distal collaterals s/p DES; LVEF 50%, moderate-severe HK of inferior basal wall  ? EYE SURGERY Bilateral   ? LEFT HEART CATHETERIZATION WITH CORONARY ANGIOGRAM N/A 01/09/2013  ? Procedure: LEFT HEART CATHETERIZATION WITH CORONARY ANGIOGRAM;  Surgeon: Peter M Martinique, MD;  Location: [facility] CATH LAB;  Service: Cardiovascular;  Laterality: N/A;  ? LEFT HEART CATHETERIZATION WITH CORONARY ANGIOGRAM N/A 01/22/2013  ? Procedure: LEFT HEART CATHETERIZATION WITH CORONARY ANGIOGRAM;  Surgeon: Sherren Mocha, MD;  Location: [facility] CATH LAB;  Service:  Cardiovascular;  Laterality: N/A;  ? ?Family History  ?Problem Relation Age of Onset  ? Heart disease Mother   ? Heart disease Father   ? Heart disease Brother   ? Heart disease Brother   ? Colon cancer Neg Hx   ? Colon polyps Neg Hx   ? ?Social History  ? ?Socioeconomic History  ? Marital status: Married  ?  Spouse name: Leatrice Jewels Raggio  ? Number of children: 1  ? Years of education: 8  ? Highest education level: Associate degree: occupational, Hotel manager, or vocational program  ?Occupational History  ? Occupation: disabled  ?  Comment: machinist   ?Tobacco Use  ? Smoking status: Never  ? Smokeless tobacco: Never  ?Vaping Use  ? Vaping Use: Never used  ?Substance and Sexual Activity  ? Alcohol use: Yes  ?  Comment: 4 times per year-wine or beer  ? Drug use: No  ? Sexual activity: Yes  ?  Birth control/protection: None  ?Other Topics Concern  ? Not on file  ?Social History Narrative  ? Spanish-speaking. Limited English.   ? ?  Social Determinants of Health  ? ?Financial Resource Strain: Not on file  ?Food Insecurity: No Food Insecurity  ? Worried About Charity fundraiser in the Last Year: Never true  ? Ran Out of Food in the Last Year: Never true  ?Transportation Needs: No Transportation Needs  ? Lack of Transportation (Medical): No  ? Lack of Transportation (Non-Medical): No  ?Physical Activity: Sufficiently Active  ? Days of Exercise per Week: 5 days  ? Minutes of Exercise per Session: 30 min  ?Stress: No Stress Concern Present  ? Feeling of Stress : Not at all  ?Social Connections: Moderately Integrated  ? Frequency of Communication with Friends and Family: More than three times a week  ? Frequency of Social Gatherings with Friends and Family: More than three times a week  ? Attends Religious Services: 1 to 4 times per year  ? Active Member of Clubs or Organizations: No  ? Attends Archivist Meetings: Never  ? Marital Status: Married  ? ? ?Tobacco Counseling ?Counseling given: Not Answered ? ? ?Clinical  Intake: ? ?Pre-visit preparation completed: Yes ? ?Pain : No/denies pain ? ?  ? ?BMI - recorded: 30.34 ?Nutritional Risks: None ?Diabetes: Yes ? ?How often do you need to have someone help you when you read instructions,

## 2022-01-15 NOTE — Patient Instructions (Signed)
Ethan Peters , ?Thank you for taking time to come for your Medicare Wellness Visit. I appreciate your ongoing commitment to your health goals. Please review the following plan we discussed and let me know if I can assist you in the future.  ? ?Screening recommendations/referrals: ?Colonoscopy: Cologuard order placed today.  ?Recommended yearly ophthalmology/optometry visit for glaucoma screening and checkup ?Recommended yearly dental visit for hygiene and checkup ? ?Vaccinations: ?Influenza vaccine: done 07/26/2021. Repeat annually ? ?Pneumococcal vaccine: Done 11/27/2016 and 08/30/2020.  ?Tdap vaccine: Done 03/31/2012 Repeat in 10 years ? ?Shingles vaccine: Discussed.    ?Covid-19: Declined. ? ?Advanced directives: Advance directive discussed with you today. Even though you declined this today, please call our office should you change your mind, and we can give you the proper paperwork for you to fill out. ? ? ?Conditions/risks identified: Aim for 30 minutes of exercise or brisk walking, 6-8 glasses of water, and 5 servings of fruits and vegetables each day. ? ? ?Next appointment: Follow up in one year for your annual wellness visit. 2024. ? ?Preventive Care 45 Years and Older, Male ? ?Preventive care refers to lifestyle choices and visits with your health care provider that can promote health and wellness. ?What does preventive care include? ?A yearly physical exam. This is also called an annual well check. ?Dental exams once or twice a year. ?Routine eye exams. Ask your health care provider how often you should have your eyes checked. ?Personal lifestyle choices, including: ?Daily care of your teeth and gums. ?Regular physical activity. ?Eating a healthy diet. ?Avoiding tobacco and drug use. ?Limiting alcohol use. ?Practicing safe sex. ?Taking low doses of aspirin every day. ?Taking vitamin and mineral supplements as recommended by your health care provider. ?What happens during an annual well check? ?The services  and screenings done by your health care provider during your annual well check will depend on your age, overall health, lifestyle risk factors, and family history of disease. ?Counseling  ?Your health care provider may ask you questions about your: ?Alcohol use. ?Tobacco use. ?Drug use. ?Emotional well-being. ?Home and relationship well-being. ?Sexual activity. ?Eating habits. ?History of falls. ?Memory and ability to understand (cognition). ?Work and work Astronomer. ?Screening  ?You may have the following tests or measurements: ?Height, weight, and BMI. ?Blood pressure. ?Lipid and cholesterol levels. These may be checked every 5 years, or more frequently if you are over 73 years old. ?Skin check. ?Lung cancer screening. You may have this screening every year starting at age 71 if you have a 30-pack-year history of smoking and currently smoke or have quit within the past 15 years. ?Fecal occult blood test (FOBT) of the stool. You may have this test every year starting at age 4. ?Flexible sigmoidoscopy or colonoscopy. You may have a sigmoidoscopy every 5 years or a colonoscopy every 10 years starting at age 74. ?Prostate cancer screening. Recommendations will vary depending on your family history and other risks. ?Hepatitis C blood test. ?Hepatitis B blood test. ?Sexually transmitted disease (STD) testing. ?Diabetes screening. This is done by checking your blood sugar (glucose) after you have not eaten for a while (fasting). You may have this done every 1-3 years. ?Abdominal aortic aneurysm (AAA) screening. You may need this if you are a current or former smoker. ?Osteoporosis. You may be screened starting at age 42 if you are at high risk. ?Talk with your health care provider about your test results, treatment options, and if necessary, the need for more tests. ?Vaccines  ?Your health  care provider may recommend certain vaccines, such as: ?Influenza vaccine. This is recommended every year. ?Tetanus, diphtheria,  and acellular pertussis (Tdap, Td) vaccine. You may need a Td booster every 10 years. ?Zoster vaccine. You may need this after age 76. ?Pneumococcal 13-valent conjugate (PCV13) vaccine. One dose is recommended after age 19. ?Pneumococcal polysaccharide (PPSV23) vaccine. One dose is recommended after age 33. ?Talk to your health care provider about which screenings and vaccines you need and how often you need them. ?This information is not intended to replace advice given to you by your health care provider. Make sure you discuss any questions you have with your health care provider. ?Document Released: 10/28/2015 Document Revised: 06/20/2016 Document Reviewed: 08/02/2015 ?Elsevier Interactive Patient Education ? 2017 [place]. ? ?Fall Prevention in the Home ?Falls can cause injuries. They can happen to people of all ages. There are many things you can do to make your home safe and to help prevent falls. ?What can I do on the outside of my home? ?Regularly fix the edges of walkways and driveways and fix any cracks. ?Remove anything that might make you trip as you walk through a door, such as a raised step or threshold. ?Trim any bushes or trees on the path to your home. ?Use bright outdoor lighting. ?Clear any walking paths of anything that might make someone trip, such as rocks or tools. ?Regularly check to see if handrails are loose or broken. Make sure that both sides of any steps have handrails. ?Any raised decks and porches should have guardrails on the edges. ?Have any leaves, snow, or ice cleared regularly. ?Use sand or salt on walking paths during winter. ?Clean up any spills in your garage right away. This includes oil or grease spills. ?What can I do in the bathroom? ?Use night lights. ?Install grab bars by the toilet and in the tub and shower. Do not use towel bars as grab bars. ?Use non-skid mats or decals in the tub or shower. ?If you need to sit down in the shower, use a plastic, non-slip  stool. ?Keep the floor dry. Clean up any water that spills on the floor as soon as it happens. ?Remove soap buildup in the tub or shower regularly. ?Attach bath mats securely with double-sided non-slip rug tape. ?Do not have throw rugs and other things on the floor that can make you trip. ?What can I do in the bedroom? ?Use night lights. ?Make sure that you have a light by your bed that is easy to reach. ?Do not use any sheets or blankets that are too big for your bed. They should not hang down onto the floor. ?Have a firm chair that has side arms. You can use this for support while you get dressed. ?Do not have throw rugs and other things on the floor that can make you trip. ?What can I do in the kitchen? ?Clean up any spills right away. ?Avoid walking on wet floors. ?Keep items that you use a lot in easy-to-reach places. ?If you need to reach something above you, use a strong step stool that has a grab bar. ?Keep electrical cords out of the way. ?Do not use floor polish or wax that makes floors slippery. If you must use wax, use non-skid floor wax. ?Do not have throw rugs and other things on the floor that can make you trip. ?What can I do with my stairs? ?Do not leave any items on the stairs. ?Make sure that there are handrails on  both sides of the stairs and use them. Fix handrails that are broken or loose. Make sure that handrails are as long as the stairways. ?Check any carpeting to make sure that it is firmly attached to the stairs. Fix any carpet that is loose or worn. ?Avoid having throw rugs at the top or bottom of the stairs. If you do have throw rugs, attach them to the floor with carpet tape. ?Make sure that you have a light switch at the top of the stairs and the bottom of the stairs. If you do not have them, ask someone to add them for you. ?What else can I do to help prevent falls? ?Wear shoes that: ?Do not have high heels. ?Have rubber bottoms. ?Are comfortable and fit you well. ?Are closed at the  toe. Do not wear sandals. ?If you use a stepladder: ?Make sure that it is fully opened. Do not climb a closed stepladder. ?Make sure that both sides of the stepladder are locked into place. ?Ask someone to hold it

## 2022-01-18 ENCOUNTER — Ambulatory Visit (INDEPENDENT_AMBULATORY_CARE_PROVIDER_SITE_OTHER): Payer: PPO | Admitting: Family Medicine

## 2022-01-18 ENCOUNTER — Encounter: Payer: Self-pay | Admitting: Family Medicine

## 2022-01-18 VITALS — BP 133/80 | HR 69 | Ht 66.0 in | Wt 164.0 lb

## 2022-01-18 DIAGNOSIS — E1159 Type 2 diabetes mellitus with other circulatory complications: Secondary | ICD-10-CM | POA: Diagnosis not present

## 2022-01-18 DIAGNOSIS — E039 Hypothyroidism, unspecified: Secondary | ICD-10-CM | POA: Diagnosis not present

## 2022-01-18 DIAGNOSIS — E119 Type 2 diabetes mellitus without complications: Secondary | ICD-10-CM

## 2022-01-18 DIAGNOSIS — E11319 Type 2 diabetes mellitus with unspecified diabetic retinopathy without macular edema: Secondary | ICD-10-CM | POA: Diagnosis not present

## 2022-01-18 DIAGNOSIS — Z23 Encounter for immunization: Secondary | ICD-10-CM | POA: Diagnosis not present

## 2022-01-18 DIAGNOSIS — I10 Essential (primary) hypertension: Secondary | ICD-10-CM | POA: Diagnosis not present

## 2022-01-18 DIAGNOSIS — L293 Anogenital pruritus, unspecified: Secondary | ICD-10-CM | POA: Diagnosis not present

## 2022-01-18 DIAGNOSIS — E782 Mixed hyperlipidemia: Secondary | ICD-10-CM

## 2022-01-18 DIAGNOSIS — E1342 Other specified diabetes mellitus with diabetic polyneuropathy: Secondary | ICD-10-CM | POA: Diagnosis not present

## 2022-01-18 LAB — URINALYSIS
Bilirubin, UA: NEGATIVE
Leukocytes,UA: NEGATIVE
Nitrite, UA: NEGATIVE
Protein,UA: NEGATIVE
Specific Gravity, UA: 1.015 (ref 1.005–1.030)
Urobilinogen, Ur: 0.2 mg/dL (ref 0.2–1.0)
pH, UA: 5.5 (ref 5.0–7.5)

## 2022-01-18 LAB — BAYER DCA HB A1C WAIVED: HB A1C (BAYER DCA - WAIVED): 12.7 % — ABNORMAL HIGH (ref 4.8–5.6)

## 2022-01-18 MED ORDER — METFORMIN HCL 1000 MG PO TABS: 1000.0000 mg | ORAL_TABLET | Freq: Two times a day (BID) | ORAL | 3 refills | Status: DC

## 2022-01-18 MED ORDER — INSULIN ASPART PROT & ASPART (70-30 MIX) 100 UNIT/ML ~~LOC~~ SUSP: SUBCUTANEOUS | 3 refills | Status: DC

## 2022-01-18 MED ORDER — GLIPIZIDE 5 MG PO TABS: 5.0000 mg | ORAL_TABLET | Freq: Every day | ORAL | 3 refills | Status: DC

## 2022-01-18 NOTE — Progress Notes (Signed)
? ?BP 133/80   Pulse 69   Ht _0  (1.676 m)   Wt 164 lb (74.4 kg)   SpO2 99%   BMI 26.47 kg/m?   ? ?Subjective:  ? ?Patient ID: Ethan Peters, male    DOB: [birth year], 66 y.o.   MRN: [id number] ? ?HPI: ?Ethan Peters is a 66 y.o. male presenting on 01/18/2022 for Medical Management of Chronic Issues, Diabetes, Hyperlipidemia, and Hypertension ? ? ?HPI ?Type 2 diabetes mellitus ?Patient comes in today for recheck of his diabetes. Patient has been currently taking NovoLog 70/30, 44 AM and 40 p.m and glipizide and metformin, he has been out of them. Patient is currently on an ACE inhibitor/ARB. Patient has not seen an ophthalmologist this year. Patient denies any issues with their feet. The symptom started onset as an adult hypertension and hyperlipidemia ARE RELATED TO DM  ? ?Hypertension ?Patient is currently on candesartan and furosemide, and their blood pressure today is 133/80. Patient denies any lightheadedness or dizziness. Patient denies headaches, blurred vision, chest pains, shortness of breath, or weakness. Denies any side effects from medication and is content with current medication.  ? ?Hyperlipidemia ?Patient is coming in for recheck of his hyperlipidemia. The patient is currently taking atorvastatin. They deny any issues with myalgias or history of liver damage from it. They deny any focal numbness or weakness or chest pain.  ? ?Hypothyroidism recheck ?Patient is coming in for thyroid recheck today as well. They deny any issues with hair changes or heat or cold problems or diarrhea or constipation. They deny any chest pain or palpitations. They are currently on levothyroxine 50 micrograms  ? ?Patient says he has been having 2 weeks worth of burning sensation when he urinates at the tip Of his penis.  He denies any fevers or chills or abdominal pain or flank pain.  Also itching.  He does admit he is been out of his insulin and his sugars been running higher. ? ?Relevant past medical, surgical,  family and social history reviewed and updated as indicated. Interim medical history since our last visit reviewed. ?Allergies and medications reviewed and updated. ? ?Review of Systems  ?Constitutional:  Negative for chills and fever.  ?Eyes:  Negative for discharge.  ?Respiratory:  Negative for shortness of breath and wheezing.   ?Cardiovascular:  Negative for chest pain and leg swelling.  ?Gastrointestinal:  Negative for abdominal pain.  ?Genitourinary:  Positive for dysuria. Negative for flank pain, frequency, hematuria, penile discharge, penile pain, penile swelling and urgency.  ?Musculoskeletal:  Negative for back pain and gait problem.  ?Skin:  Negative for rash.  ?All other systems reviewed and are negative. ? ?Per HPI unless specifically indicated above ? ? ?Allergies as of 01/18/2022   ? ?   Reactions  ? Levemir [insulin Detemir] Swelling  ? Lisinopril Cough  ? Trazodone And Nefazodone   ? Penicillins Rash  ? Lip swelling  ? ?  ? ?  ?Medication List  ?  ? ?  ? Accurate as of January 18, 2022 11:55 AM. If you have any questions, ask your nurse or doctor.  ?  ?  ? ?  ? ?allopurinol 300 MG tablet ?Commonly known as: ZYLOPRIM ?TAKE ONE TABLET BY MOUTH ONCE DAILY AS NEEDED FOR GOUT prevention ?  ?aspirin 81 MG EC tablet ?Take 1 tablet (81 mg total) by mouth daily. ?  ?atorvastatin 80 MG tablet ?Commonly known as: LIPITOR ?TAKE 1 TABLET BY MOUTH ONCE DAILY AT 6 PM  FOR CHOLESTEROL ?  ?B-complex with vitamin C tablet ?Take 1 tablet by mouth as needed (for vitamin deficiency). ?  ?brimonidine 0.2 % ophthalmic solution ?Commonly known as: ALPHAGAN ?Place 1 drop into both eyes 3 (three) times daily. Give 90-day supply at a time ?  ?candesartan 16 MG tablet ?Commonly known as: ATACAND ?Take 1 tablet (16 mg total) by mouth in the morning and at bedtime. ?  ?cholecalciferol 25 MCG (1000 UNIT) tablet ?Commonly known as: VITAMIN D3 ?Take 1,000 Units by mouth daily. ?  ?dorzolamide-timolol 22.3-6.8 MG/ML ophthalmic  solution ?Commonly known as: COSOPT ?1 DROP BOTH EYES TWICE A DAY. SEPARATE BY AT LEAST 10 MINUTES FROM OTHER PRESSURE REDUCING EYE DROPS ?  ?furosemide 20 MG tablet ?Commonly known as: LASIX ?TAKE 1 TABLET BY MOUTH EVERY DAY AS NEEDED ?  ?glipiZIDE 5 MG tablet ?Commonly known as: GLUCOTROL ?Take 1 tablet (5 mg total) by mouth daily before breakfast. ?  ?insulin aspart protamine- aspart (70-30) 100 UNIT/ML injection ?Commonly known as: NovoLOG Mix 70/30 ?44 units before breakfast and 40 units before supper daily ?  ?latanoprost 0.005 % ophthalmic solution ?Commonly known as: XALATAN ?INSTILL 1 DROP IN BOTH EYES EVERY EVENING ?  ?levothyroxine 50 MCG tablet ?Commonly known as: SYNTHROID ?Take 1 tablet (50 mcg total) by mouth daily. ?  ?meloxicam 15 MG tablet ?Commonly known as: MOBIC ?Take 1 tablet by mouth daily. ?  ?metFORMIN 1000 MG tablet ?Commonly known as: GLUCOPHAGE ?Take 1 tablet (1,000 mg total) by mouth 2 (two) times daily with a meal. ?  ?nitroGLYCERIN 0.4 MG SL tablet ?Commonly known as: NITROSTAT ?Place 1 tablet (0.4 mg total) under the tongue every 5 (five) minutes x 3 doses as needed for chest pain. ?  ?OneTouch Verio test strip ?Generic drug: glucose blood ?USE 1 STRIP TO CHECK GLUCOSE 4 TIMES DAILY DX E13.42 ?  ?Prodigy Voice Blood Glucose w/Device Kit ?Use to test blood sugar up to 6 times daily. DX:E11.9 Patient needs talking glucometer ?  ?tamsulosin 0.4 MG Caps capsule ?Commonly known as: FLOMAX ?Take 0.4 mg by mouth daily. ?  ? ?  ? ? ? ?Objective:  ? ?BP 133/80   Pulse 69   Ht _0  (1.676 m)   Wt 164 lb (74.4 kg)   SpO2 99%   BMI 26.47 kg/m?   ?Wt Readings from Last 3 Encounters:  ?01/18/22 164 lb (74.4 kg)  ?01/15/22 188 lb (85.3 kg)  ?07/26/21 188 lb (85.3 kg)  ?  ?Physical Exam ?Vitals and nursing note reviewed.  ?Constitutional:   ?   General: He is not in acute distress. ?   Appearance: He is well-developed. He is not diaphoretic.  ?Eyes:  ?   General: No scleral icterus. ?    Conjunctiva/sclera: Conjunctivae normal.  ?Neck:  ?   Thyroid: No thyromegaly.  ?Cardiovascular:  ?   Rate and Rhythm: Normal rate and regular rhythm.  ?   Heart sounds: Normal heart sounds. No murmur heard. ?Pulmonary:  ?   Effort: Pulmonary effort is normal. No respiratory distress.  ?   Breath sounds: Normal breath sounds. No wheezing.  ?Abdominal:  ?   General: Abdomen is flat. Bowel sounds are normal. There is no distension.  ?   Tenderness: There is no abdominal tenderness. There is no right CVA tenderness, left CVA tenderness, guarding or rebound.  ?Musculoskeletal:     ?   General: Normal range of motion.  ?   Cervical back: Neck supple.  ?Lymphadenopathy:  ?  Cervical: No cervical adenopathy.  ?Skin: ?   General: Skin is warm and dry.  ?   Findings: No rash.  ?Neurological:  ?   Mental Status: He is alert and oriented to person, place, and time.  ?   Coordination: Coordination normal.  ?Psychiatric:     ?   Behavior: Behavior normal.  ? ? ?Urinalysis: 3+ glucose, otherwise negative ? ?Assessment & Plan:  ? ?Problem List Items Addressed This Visit   ? ?  ? Cardiovascular and Mediastinum  ? DM type 2 causing vascular disease ([place])  ? Relevant Medications  ? metFORMIN (GLUCOPHAGE) 1000 MG tablet  ? insulin aspart protamine- aspart (NOVOLOG MIX 70/30) (70-30) 100 UNIT/ML injection  ? glipiZIDE (GLUCOTROL) 5 MG tablet  ? Essential hypertension  ? Relevant Orders  ? CBC with Differential/Platelet  ? CMP14+EGFR  ? Lipid panel  ? Bayer DCA Hb A1c Waived  ? TSH  ?  ? Endocrine  ? Diabetic neuropathy ([place])  ? Relevant Medications  ? metFORMIN (GLUCOPHAGE) 1000 MG tablet  ? insulin aspart protamine- aspart (NOVOLOG MIX 70/30) (70-30) 100 UNIT/ML injection  ? glipiZIDE (GLUCOTROL) 5 MG tablet  ? Hypothyroidism  ? Relevant Orders  ? CBC with Differential/Platelet  ? CMP14+EGFR  ? Lipid panel  ? Bayer DCA Hb A1c Waived  ? TSH  ? Diabetic retinopathy of both eyes associated with type 2 diabetes mellitus ([place])  ? Relevant  Medications  ? metFORMIN (GLUCOPHAGE) 1000 MG tablet  ? insulin aspart protamine- aspart (NOVOLOG MIX 70/30) (70-30) 100 UNIT/ML injection  ? glipiZIDE (GLUCOTROL) 5 MG tablet  ?  ? Other  ? Mixed hyperlipidemia - Primary  ? Re

## 2022-01-18 NOTE — Addendum Note (Signed)
Addended by: Dorene Sorrow on: 01/18/2022 12:44 PM ? ? Modules accepted: Orders ? ?

## 2022-01-19 LAB — CBC WITH DIFFERENTIAL/PLATELET
Basophils Absolute: 0.1 10*3/uL (ref 0.0–0.2)
Basos: 1 %
EOS (ABSOLUTE): 0.2 10*3/uL (ref 0.0–0.4)
Eos: 2 %
Hematocrit: 43.7 % (ref 37.5–51.0)
Hemoglobin: 14.8 g/dL (ref 13.0–17.7)
Immature Grans (Abs): 0 10*3/uL (ref 0.0–0.1)
Immature Granulocytes: 0 %
Lymphocytes Absolute: 1.8 10*3/uL (ref 0.7–3.1)
Lymphs: 26 %
MCH: 32.3 pg (ref 26.6–33.0)
MCHC: 33.9 g/dL (ref 31.5–35.7)
MCV: 95 fL (ref 79–97)
Monocytes Absolute: 0.6 10*3/uL (ref 0.1–0.9)
Monocytes: 9 %
Neutrophils Absolute: 4.4 10*3/uL (ref 1.4–7.0)
Neutrophils: 62 %
Platelets: 271 10*3/uL (ref 150–450)
RBC: 4.58 x10E6/uL (ref 4.14–5.80)
RDW: 12.2 % (ref 11.6–15.4)
WBC: 7 10*3/uL (ref 3.4–10.8)

## 2022-01-19 LAB — LIPID PANEL
Chol/HDL Ratio: 5.2 ratio — ABNORMAL HIGH (ref 0.0–5.0)
Cholesterol, Total: 199 mg/dL (ref 100–199)
HDL: 38 mg/dL — ABNORMAL LOW (ref >39)
LDL Chol Calc (NIH): 130 mg/dL — ABNORMAL HIGH (ref 0–99)
Triglycerides: 170 mg/dL — ABNORMAL HIGH (ref 0–149)
VLDL Cholesterol Cal: 31 mg/dL (ref 5–40)

## 2022-01-19 LAB — CMP14+EGFR
ALT: 27 IU/L (ref 0–44)
AST: 22 IU/L (ref 0–40)
Albumin/Globulin Ratio: 1.3 (ref 1.2–2.2)
Albumin: 4.1 g/dL (ref 3.8–4.8)
Alkaline Phosphatase: 111 IU/L (ref 44–121)
BUN/Creatinine Ratio: 22 (ref 10–24)
BUN: 23 mg/dL (ref 8–27)
Bilirubin Total: 0.4 mg/dL (ref 0.0–1.2)
CO2: 23 mmol/L (ref 20–29)
Calcium: 9.5 mg/dL (ref 8.6–10.2)
Chloride: 98 mmol/L (ref 96–106)
Creatinine, Ser: 1.06 mg/dL (ref 0.76–1.27)
Globulin, Total: 3.2 g/dL (ref 1.5–4.5)
Glucose: 372 mg/dL — ABNORMAL HIGH (ref 70–99)
Potassium: 4.9 mmol/L (ref 3.5–5.2)
Sodium: 134 mmol/L (ref 134–144)
Total Protein: 7.3 g/dL (ref 6.0–8.5)
eGFR: 77 mL/min/{1.73_m2} (ref >59)

## 2022-01-19 LAB — TSH: TSH: 1.65 u[IU]/mL (ref 0.450–4.500)

## 2022-01-22 ENCOUNTER — Ambulatory Visit (INDEPENDENT_AMBULATORY_CARE_PROVIDER_SITE_OTHER): Payer: PPO | Admitting: Family Medicine

## 2022-01-22 ENCOUNTER — Other Ambulatory Visit: Payer: Self-pay

## 2022-01-22 ENCOUNTER — Inpatient Hospital Stay (HOSPITAL_COMMUNITY)
Admission: EM | Admit: 2022-01-22 | Discharge: 2022-01-27 | DRG: 637 | Disposition: A | Discharge status: Home / Self Care | Payer: PPO | Attending: Internal Medicine | Admitting: Internal Medicine

## 2022-01-22 ENCOUNTER — Encounter (HOSPITAL_COMMUNITY): Payer: Self-pay | Admitting: Emergency Medicine

## 2022-01-22 ENCOUNTER — Emergency Department (HOSPITAL_COMMUNITY): Payer: PPO

## 2022-01-22 ENCOUNTER — Ambulatory Visit (INDEPENDENT_AMBULATORY_CARE_PROVIDER_SITE_OTHER): Payer: PPO

## 2022-01-22 ENCOUNTER — Encounter: Payer: Self-pay | Admitting: Family Medicine

## 2022-01-22 VITALS — BP 147/82 | HR 92 | Temp 98.0°F | Ht 66.0 in | Wt 160.8 lb

## 2022-01-22 DIAGNOSIS — H547 Unspecified visual loss: Secondary | ICD-10-CM | POA: Diagnosis not present

## 2022-01-22 DIAGNOSIS — Z880 Allergy status to penicillin: Secondary | ICD-10-CM

## 2022-01-22 DIAGNOSIS — I2510 Atherosclerotic heart disease of native coronary artery without angina pectoris: Secondary | ICD-10-CM | POA: Diagnosis present

## 2022-01-22 DIAGNOSIS — B952 Enterococcus as the cause of diseases classified elsewhere: Secondary | ICD-10-CM | POA: Diagnosis present

## 2022-01-22 DIAGNOSIS — E871 Hypo-osmolality and hyponatremia: Secondary | ICD-10-CM | POA: Diagnosis present

## 2022-01-22 DIAGNOSIS — E1110 Type 2 diabetes mellitus with ketoacidosis without coma: Principal | ICD-10-CM | POA: Diagnosis present

## 2022-01-22 DIAGNOSIS — Z20822 Contact with and (suspected) exposure to covid-19: Secondary | ICD-10-CM | POA: Diagnosis not present

## 2022-01-22 DIAGNOSIS — Z7984 Long term (current) use of oral hypoglycemic drugs: Secondary | ICD-10-CM | POA: Diagnosis not present

## 2022-01-22 DIAGNOSIS — R109 Unspecified abdominal pain: Secondary | ICD-10-CM | POA: Diagnosis not present

## 2022-01-22 DIAGNOSIS — E861 Hypovolemia: Secondary | ICD-10-CM | POA: Diagnosis present

## 2022-01-22 DIAGNOSIS — M109 Gout, unspecified: Secondary | ICD-10-CM | POA: Diagnosis not present

## 2022-01-22 DIAGNOSIS — R1084 Generalized abdominal pain: Secondary | ICD-10-CM | POA: Diagnosis not present

## 2022-01-22 DIAGNOSIS — N400 Enlarged prostate without lower urinary tract symptoms: Secondary | ICD-10-CM

## 2022-01-22 DIAGNOSIS — E1165 Type 2 diabetes mellitus with hyperglycemia: Secondary | ICD-10-CM

## 2022-01-22 DIAGNOSIS — K5900 Constipation, unspecified: Secondary | ICD-10-CM

## 2022-01-22 DIAGNOSIS — Z888 Allergy status to other drugs, medicaments and biological substances status: Secondary | ICD-10-CM | POA: Diagnosis not present

## 2022-01-22 DIAGNOSIS — N179 Acute kidney failure, unspecified: Secondary | ICD-10-CM | POA: Diagnosis not present

## 2022-01-22 DIAGNOSIS — E878 Other disorders of electrolyte and fluid balance, not elsewhere classified: Secondary | ICD-10-CM | POA: Diagnosis not present

## 2022-01-22 DIAGNOSIS — Z79899 Other long term (current) drug therapy: Secondary | ICD-10-CM | POA: Diagnosis not present

## 2022-01-22 DIAGNOSIS — Z79890 Hormone replacement therapy (postmenopausal): Secondary | ICD-10-CM

## 2022-01-22 DIAGNOSIS — J180 Bronchopneumonia, unspecified organism: Secondary | ICD-10-CM | POA: Diagnosis present

## 2022-01-22 DIAGNOSIS — I10 Essential (primary) hypertension: Secondary | ICD-10-CM | POA: Diagnosis not present

## 2022-01-22 DIAGNOSIS — E039 Hypothyroidism, unspecified: Secondary | ICD-10-CM | POA: Diagnosis not present

## 2022-01-22 DIAGNOSIS — Z794 Long term (current) use of insulin: Secondary | ICD-10-CM

## 2022-01-22 DIAGNOSIS — I700 Atherosclerosis of aorta: Secondary | ICD-10-CM | POA: Diagnosis not present

## 2022-01-22 DIAGNOSIS — Z7982 Long term (current) use of aspirin: Secondary | ICD-10-CM | POA: Diagnosis not present

## 2022-01-22 DIAGNOSIS — N390 Urinary tract infection, site not specified: Secondary | ICD-10-CM | POA: Diagnosis present

## 2022-01-22 DIAGNOSIS — K567 Ileus, unspecified: Secondary | ICD-10-CM

## 2022-01-22 DIAGNOSIS — I252 Old myocardial infarction: Secondary | ICD-10-CM

## 2022-01-22 DIAGNOSIS — E785 Hyperlipidemia, unspecified: Secondary | ICD-10-CM

## 2022-01-22 DIAGNOSIS — J189 Pneumonia, unspecified organism: Secondary | ICD-10-CM | POA: Diagnosis not present

## 2022-01-22 DIAGNOSIS — K760 Fatty (change of) liver, not elsewhere classified: Secondary | ICD-10-CM | POA: Diagnosis not present

## 2022-01-22 DIAGNOSIS — Z8249 Family history of ischemic heart disease and other diseases of the circulatory system: Secondary | ICD-10-CM

## 2022-01-22 DIAGNOSIS — E876 Hypokalemia: Secondary | ICD-10-CM | POA: Diagnosis not present

## 2022-01-22 DIAGNOSIS — Z955 Presence of coronary angioplasty implant and graft: Secondary | ICD-10-CM

## 2022-01-22 LAB — COMPREHENSIVE METABOLIC PANEL
ALT: 24 U/L (ref 0–44)
AST: 24 U/L (ref 15–41)
Albumin: 3.4 g/dL — ABNORMAL LOW (ref 3.5–5.0)
Alkaline Phosphatase: 88 U/L (ref 38–126)
Anion gap: 19 — ABNORMAL HIGH (ref 5–15)
BUN: 18 mg/dL (ref 8–23)
CO2: 13 mmol/L — ABNORMAL LOW (ref 22–32)
Calcium: 8.9 mg/dL (ref 8.9–10.3)
Chloride: 93 mmol/L — ABNORMAL LOW (ref 98–111)
Creatinine, Ser: 1.78 mg/dL — ABNORMAL HIGH (ref 0.61–1.24)
GFR, Estimated: 42 mL/min — ABNORMAL LOW (ref >60)
Glucose, Bld: 400 mg/dL — ABNORMAL HIGH (ref 70–99)
Potassium: 4.1 mmol/L (ref 3.5–5.1)
Sodium: 125 mmol/L — ABNORMAL LOW (ref 135–145)
Total Bilirubin: 1.7 mg/dL — ABNORMAL HIGH (ref 0.3–1.2)
Total Protein: 8.1 g/dL (ref 6.5–8.1)

## 2022-01-22 LAB — CBG MONITORING, ED
Glucose-Capillary: 391 mg/dL — ABNORMAL HIGH (ref 70–99)
Glucose-Capillary: 378 mg/dL — ABNORMAL HIGH (ref 70–99)
Glucose-Capillary: 315 mg/dL — ABNORMAL HIGH (ref 70–99)

## 2022-01-22 LAB — I-STAT VENOUS BLOOD GAS, ED
Acid-base deficit: 8 mmol/L — ABNORMAL HIGH (ref 0.0–2.0)
Bicarbonate: 15.7 mmol/L — ABNORMAL LOW (ref 20.0–28.0)
Calcium, Ion: 1.05 mmol/L — ABNORMAL LOW (ref 1.15–1.40)
HCT: 42 % (ref 39.0–52.0)
Hemoglobin: 14.3 g/dL (ref 13.0–17.0)
O2 Saturation: 99 %
Potassium: 4.1 mmol/L (ref 3.5–5.1)
Sodium: 127 mmol/L — ABNORMAL LOW (ref 135–145)
TCO2: 16 mmol/L — ABNORMAL LOW (ref 22–32)
pCO2, Ven: 27.4 mmHg — ABNORMAL LOW (ref 44–60)
pH, Ven: 7.364 (ref 7.25–7.43)
pO2, Ven: 136 mmHg — ABNORMAL HIGH (ref 32–45)

## 2022-01-22 LAB — BASIC METABOLIC PANEL
Anion gap: 16 — ABNORMAL HIGH (ref 5–15)
BUN: 19 mg/dL (ref 8–23)
CO2: 15 mmol/L — ABNORMAL LOW (ref 22–32)
Calcium: 8.7 mg/dL — ABNORMAL LOW (ref 8.9–10.3)
Chloride: 95 mmol/L — ABNORMAL LOW (ref 98–111)
Creatinine, Ser: 1.75 mg/dL — ABNORMAL HIGH (ref 0.61–1.24)
GFR, Estimated: 42 mL/min — ABNORMAL LOW (ref >60)
Glucose, Bld: 407 mg/dL — ABNORMAL HIGH (ref 70–99)
Potassium: 4.1 mmol/L (ref 3.5–5.1)
Sodium: 126 mmol/L — ABNORMAL LOW (ref 135–145)

## 2022-01-22 LAB — CBC WITH DIFFERENTIAL/PLATELET
Abs Immature Granulocytes: 0.12 10*3/uL — ABNORMAL HIGH (ref 0.00–0.07)
Abs Immature Granulocytes: 0.09 10*3/uL — ABNORMAL HIGH (ref 0.00–0.07)
Basophils Absolute: 0.1 10*3/uL (ref 0.0–0.1)
Basophils Absolute: 0.1 10*3/uL (ref 0.0–0.1)
Basophils Relative: 0 %
Basophils Relative: 0 %
Eosinophils Absolute: 0 10*3/uL (ref 0.0–0.5)
Eosinophils Absolute: 0 10*3/uL (ref 0.0–0.5)
Eosinophils Relative: 0 %
Eosinophils Relative: 0 %
HCT: 42.6 % (ref 39.0–52.0)
HCT: 40 % (ref 39.0–52.0)
Hemoglobin: 14.7 g/dL (ref 13.0–17.0)
Hemoglobin: 14.2 g/dL (ref 13.0–17.0)
Immature Granulocytes: 1 %
Immature Granulocytes: 1 %
Lymphocytes Relative: 5 %
Lymphocytes Relative: 6 %
Lymphs Abs: 0.9 10*3/uL (ref 0.7–4.0)
Lymphs Abs: 1.1 10*3/uL (ref 0.7–4.0)
MCH: 32.4 pg (ref 26.0–34.0)
MCH: 32.9 pg (ref 26.0–34.0)
MCHC: 34.5 g/dL (ref 30.0–36.0)
MCHC: 35.5 g/dL (ref 30.0–36.0)
MCV: 93.8 fL (ref 80.0–100.0)
MCV: 92.6 fL (ref 80.0–100.0)
Monocytes Absolute: 1.1 10*3/uL — ABNORMAL HIGH (ref 0.1–1.0)
Monocytes Absolute: 1.2 10*3/uL — ABNORMAL HIGH (ref 0.1–1.0)
Monocytes Relative: 6 %
Monocytes Relative: 6 %
Neutro Abs: 15.7 10*3/uL — ABNORMAL HIGH (ref 1.7–7.7)
Neutro Abs: 16.1 10*3/uL — ABNORMAL HIGH (ref 1.7–7.7)
Neutrophils Relative %: 88 %
Neutrophils Relative %: 87 %
Platelets: 281 10*3/uL (ref 150–400)
Platelets: 244 10*3/uL (ref 150–400)
RBC: 4.54 MIL/uL (ref 4.22–5.81)
RBC: 4.32 MIL/uL (ref 4.22–5.81)
RDW: 11.9 % (ref 11.5–15.5)
RDW: 11.9 % (ref 11.5–15.5)
WBC: 17.8 10*3/uL — ABNORMAL HIGH (ref 4.0–10.5)
WBC: 18.5 10*3/uL — ABNORMAL HIGH (ref 4.0–10.5)
nRBC: 0 % (ref 0.0–0.2)
nRBC: 0 % (ref 0.0–0.2)

## 2022-01-22 LAB — LIPASE, BLOOD: Lipase: 25 U/L (ref 11–51)

## 2022-01-22 LAB — LACTIC ACID, PLASMA
Lactic Acid, Venous: 2.3 mmol/L (ref 0.5–1.9)
Lactic Acid, Venous: 2.2 mmol/L (ref 0.5–1.9)

## 2022-01-22 MED ORDER — ONDANSETRON HCL 4 MG/2ML IJ SOLN
4.0000 mg | Freq: Once | INTRAMUSCULAR | Status: AC
  Administered 2022-01-22: 4 mg via INTRAVENOUS
  Filled 2022-01-22: qty 2

## 2022-01-22 MED ORDER — LEVOTHYROXINE SODIUM 50 MCG PO TABS
50.0000 ug | ORAL_TABLET | Freq: Every day | ORAL | Status: DC
  Administered 2022-01-23 – 2022-01-27 (×5): 50 ug via ORAL
  Filled 2022-01-22 (×4): qty 1
  Filled 2022-01-22: qty 2

## 2022-01-22 MED ORDER — IOHEXOL 350 MG/ML SOLN
80.0000 mL | Freq: Once | INTRAVENOUS | Status: AC | PRN
  Administered 2022-01-22: 80 mL via INTRAVENOUS

## 2022-01-22 MED ORDER — MELATONIN 3 MG PO TABS
3.0000 mg | ORAL_TABLET | Freq: Every day | ORAL | Status: DC
  Administered 2022-01-22 – 2022-01-23 (×2): 3 mg via ORAL
  Filled 2022-01-22 (×2): qty 1

## 2022-01-22 MED ORDER — VITAMIN D 25 MCG (1000 UNIT) PO TABS
1000.0000 [IU] | ORAL_TABLET | Freq: Every day | ORAL | Status: DC
  Administered 2022-01-22 – 2022-01-27 (×6): 1000 [IU] via ORAL
  Filled 2022-01-22 (×7): qty 1

## 2022-01-22 MED ORDER — LATANOPROST 0.005 % OP SOLN
1.0000 [drp] | Freq: Every day | OPHTHALMIC | Status: DC
  Administered 2022-01-23 – 2022-01-26 (×5): 1 [drp] via OPHTHALMIC
  Filled 2022-01-22: qty 2.5

## 2022-01-22 MED ORDER — TAMSULOSIN HCL 0.4 MG PO CAPS
0.4000 mg | ORAL_CAPSULE | Freq: Every day | ORAL | Status: DC
  Administered 2022-01-22 – 2022-01-27 (×6): 0.4 mg via ORAL
  Filled 2022-01-22 (×6): qty 1

## 2022-01-22 MED ORDER — MAGNESIUM HYDROXIDE 400 MG/5ML PO SUSP: 30.0000 mL | Freq: Every day | ORAL | Status: DC | PRN

## 2022-01-22 MED ORDER — ONDANSETRON HCL 4 MG PO TABS: 4.0000 mg | ORAL_TABLET | Freq: Four times a day (QID) | ORAL | Status: DC | PRN

## 2022-01-22 MED ORDER — BRIMONIDINE TARTRATE 0.2 % OP SOLN
1.0000 [drp] | Freq: Three times a day (TID) | OPHTHALMIC | Status: DC
  Administered 2022-01-23 – 2022-01-27 (×14): 1 [drp] via OPHTHALMIC
  Filled 2022-01-22: qty 5

## 2022-01-22 MED ORDER — NITROGLYCERIN 0.4 MG SL SUBL: 0.4000 mg | SUBLINGUAL_TABLET | SUBLINGUAL | Status: DC | PRN

## 2022-01-22 MED ORDER — B COMPLEX-C PO TABS
1.0000 | ORAL_TABLET | ORAL | Status: DC | PRN
  Filled 2022-01-22: qty 1

## 2022-01-22 MED ORDER — ACETAMINOPHEN 325 MG PO TABS
650.0000 mg | ORAL_TABLET | Freq: Four times a day (QID) | ORAL | Status: DC | PRN
  Administered 2022-01-23 – 2022-01-27 (×9): 650 mg via ORAL
  Filled 2022-01-22 (×9): qty 2

## 2022-01-22 MED ORDER — ACETAMINOPHEN 650 MG RE SUPP: 650.0000 mg | Freq: Four times a day (QID) | RECTAL | Status: DC | PRN

## 2022-01-22 MED ORDER — ALLOPURINOL 300 MG PO TABS
300.0000 mg | ORAL_TABLET | Freq: Every day | ORAL | Status: DC
  Administered 2022-01-23 – 2022-01-27 (×6): 300 mg via ORAL
  Filled 2022-01-22 (×3): qty 1
  Filled 2022-01-22: qty 3
  Filled 2022-01-22 (×2): qty 1

## 2022-01-22 MED ORDER — MORPHINE SULFATE (PF) 2 MG/ML IV SOLN
2.0000 mg | INTRAVENOUS | Status: DC | PRN
  Administered 2022-01-22 – 2022-01-26 (×6): 2 mg via INTRAVENOUS
  Filled 2022-01-22 (×6): qty 1

## 2022-01-22 MED ORDER — INSULIN REGULAR(HUMAN) IN NACL 100-0.9 UT/100ML-% IV SOLN
INTRAVENOUS | Status: DC
  Administered 2022-01-22: 10.5 [IU]/h via INTRAVENOUS
  Filled 2022-01-22: qty 100

## 2022-01-22 MED ORDER — LACTATED RINGERS IV SOLN: INTRAVENOUS | Status: DC

## 2022-01-22 MED ORDER — LACTATED RINGERS IV BOLUS
1000.0000 mL | Freq: Once | INTRAVENOUS | Status: AC
Start: 2022-01-22 — End: 2022-01-23
  Administered 2022-01-22: 1000 mL via INTRAVENOUS

## 2022-01-22 MED ORDER — ONDANSETRON HCL 4 MG/2ML IJ SOLN
4.0000 mg | Freq: Four times a day (QID) | INTRAMUSCULAR | Status: DC | PRN
  Filled 2022-01-22: qty 2

## 2022-01-22 MED ORDER — DORZOLAMIDE HCL-TIMOLOL MAL 2-0.5 % OP SOLN
1.0000 [drp] | Freq: Two times a day (BID) | OPHTHALMIC | Status: DC
  Administered 2022-01-23 – 2022-01-27 (×10): 1 [drp] via OPHTHALMIC
  Filled 2022-01-22: qty 10

## 2022-01-22 MED ORDER — ENOXAPARIN SODIUM 40 MG/0.4ML IJ SOSY
40.0000 mg | PREFILLED_SYRINGE | INTRAMUSCULAR | Status: DC
  Administered 2022-01-22 – 2022-01-26 (×5): 40 mg via SUBCUTANEOUS
  Filled 2022-01-22 (×5): qty 0.4

## 2022-01-22 MED ORDER — MORPHINE SULFATE (PF) 4 MG/ML IV SOLN
4.0000 mg | Freq: Once | INTRAVENOUS | Status: AC
  Administered 2022-01-22: 4 mg via INTRAVENOUS
  Filled 2022-01-22: qty 1

## 2022-01-22 MED ORDER — DEXTROSE 50 % IV SOLN: 0.0000 mL | INTRAVENOUS | Status: DC | PRN

## 2022-01-22 MED ORDER — DEXTROSE IN LACTATED RINGERS 5 % IV SOLN: INTRAVENOUS | Status: DC

## 2022-01-22 MED ORDER — POTASSIUM CHLORIDE 10 MEQ/100ML IV SOLN
10.0000 meq | INTRAVENOUS | Status: AC
  Administered 2022-01-22 (×2): 10 meq via INTRAVENOUS
  Filled 2022-01-22 (×2): qty 100

## 2022-01-22 NOTE — ED Notes (Signed)
MD at the bedside  

## 2022-01-22 NOTE — ED Provider Triage Note (Signed)
Emergency Medicine Provider Triage Evaluation Note ? ?Ethan Peters , a 66 y.o. male  was evaluated in triage.  Pt complains of gradual onset, constant, achy, diffuse abdominal pain since Friday.  Patient also complains of nausea and dry heaving as well as constipation.  Last had a bowel movement Thursday.  He went to his PCP today and had an x-ray with concern for ileus and was advised to come to the ED for further evaluation.  No previous abdominal surgeries. ? ?Review of Systems  ?Positive: + abd pain, nausea, dry heaving, constipation ?Negative: - fevers ? ?Physical Exam  ?BP (!) 163/98   Pulse 97   Temp 98.3 ?F (36.8 ?C) (Oral)   Resp 18   SpO2 96%  ?Gen:   Awake, no distress   ?Resp:  Normal effort  ?MSK:   Moves extremities without difficulty  ?Other:  Decreased BS throughout. + Abd TTP ? ?Medical Decision Making  ?Medically screening exam initiated at 4:45 PM.  Appropriate orders placed.  Ethan Peters was informed that the remainder of the evaluation will be completed by another provider, this initial triage assessment does not replace that evaluation, and the importance of remaining in the ED until their evaluation is complete. ? ? ?  ?Tanda Rockers, PA-C ?01/22/22 1646 ? ?

## 2022-01-22 NOTE — Patient Instructions (Signed)
Ileus ?Ileus is a condition that happens when the intestines, which are also called bowels, stop working correctly. The intestines are hollow organs that digest food after the food leaves the stomach. These organs are long, muscular tubes that connect the stomach to the rectum. When ileus occurs, the muscular contractions that cause food to move through the intestines do not happen as they normally would. ?If the intestines stop working, food cannot pass through to get digested. This condition is a serious problem that usually requires hospitalization. It can cause symptoms such as nausea, abdominal pain, and bloating. Ileus can last from a few hours to a few days. ?What are the causes? ?This condition may be caused by: ?Surgery on the abdomen. ?An infection or inflammation in the abdomen. This includes inflammation of the lining of the abdomen (peritonitis). ?Infection or inflammation in other parts of the body, such as pneumonia or pancreatitis. ?Passage of gallstones or kidney stones. ?Damage to the nerves or blood vessels that go to the intestines. ?A collection of blood within the abdominal cavity. ?Imbalance in the salts in the blood (electrolytes). ?Injury to the brain or spinal cord. ?Medicines. Many medicines, including strong pain medicines, can cause ileus or make it worse. ?If the intestines stop working because of a blockage, that is a different condition that is called a bowel obstruction. ?What are the signs or symptoms? ?Symptoms of this condition include: ?Bloating of the abdomen. ?Pain or discomfort in the abdomen. ?Poor appetite. ?Nausea and vomiting. ?Lack of normal bowel sounds, such as "growling" in the stomach. ?How is this diagnosed? ?This condition may be diagnosed with: ?A physical exam and medical history. ?X-rays or a CT scan of the abdomen. ?You may also have other tests to help find the cause of the condition. ?How is this treated? ?This condition may be treated by: ?Resting the  intestines until they start to work again. This is often done by: ?Stopping oral intake of food and drink. You will be given fluid through an IV to prevent dehydration. ?Placing a small tube (nasogastric tube or NG tube) that is passed through your nose and into your stomach. The tube is attached to a suction device and keeps the stomach emptied out. This allows the bowels to rest and helps to reduce nausea and vomiting. ?Correcting any electrolyte imbalance by giving supplements in the IV fluid. ?Stopping any medicines that might make ileus worse. ?Treating any condition that may have caused ileus. ?Follow these instructions at home: ?Eating and drinking ? ?Follow instructions from your health care provider about: ?What to eat and drink. You may be told to start eating a bland diet. Over time, you may slowly resume a more normal, healthy diet. ?How much to eat and drink. You should eat small meals often and stop eating when you feel full. ?Avoid alcohol. ?General instructions ?Take over-the-counter and prescription medicines only as told by your health care provider. ?Rest as told by your health care provider. ?Avoid sitting for a long time without moving. Get up to take short walks every 1-2 hours. Ask for help if you feel weak or unsteady. ?Keep all follow-up visits as told by your health care provider. This is important. ?Contact a health care provider if: ?You have nausea, vomiting, or abdominal discomfort. ?You have a fever. ?Get help right away if: ?You have severe abdominal pain or bloating. ?You cannot eat or drink without vomiting. ?Summary ?Ileus is a condition that happens when the intestines, which are also called bowels, stop  working correctly. ?When ileus occurs, the muscular contractions that cause food to move through the intestines do not happen as they normally would. ?Ileus can cause symptoms such as nausea, abdominal pain, and bloating. ?Treatment may involve getting IV fluids and having a  nasogastric tube placed to keep your stomach emptied out until the intestines start working again. ?This information is not intended to replace advice given to you by your health care provider. Make sure you discuss any questions you have with your health care provider. ?Document Revised: 04/12/2021 Document Reviewed: 04/12/2021 ?Elsevier Patient Education ? 2022 [employer] ? ?

## 2022-01-22 NOTE — Progress Notes (Signed)
? ?Acute Office Visit ? ?Subjective:  ? ? Patient ID: Ethan Peters, male    DOB: [birth year], 66 y.o.   MRN: [id number] ? ?Chief Complaint  ?Patient presents with  ? Abdominal Pain  ?  Lower back pain   ? Flank Pain  ?  Right sided x 3 days   ? ? ?HPI ?Here with family member today. Patient is in today for pain in his lower back pain x 3 days. The pain is sharp and constant. It is severe. Also reports nausea and decreased appetite. No BM for 5 days. Has not been passing gas either.  He denies fever, diarrhea, vomiting, urinary symptoms, or injury.  ? ?Past Medical History:  ?Diagnosis Date  ? CAD (coronary artery disease) 01/09/13  ? Inferior STEMI s/p DES-RCA  ? Cataract   ? Diabetes mellitus   ? Uncontrolled, Hgb A1C 14.8% on 03/14, started on insulin  ? Gout   ? Hyperlipidemia   ? Hypertension   ? Ischemic cardiomyopathy   ? EF 86-57%, grade 1 diastolic dysfunction, mildly dilated RV, RA at the upper limits of normal, mild TR, PA systolic pressure 32 mm mercury and hypokinesis to akinesis of the basal mid inferior myocardium  ? Myocardial infarction ([place]) 01/09/2013  ? Shortness of breath   ? Thyroid disease   ? ? ?Past Surgical History:  ?Procedure Laterality Date  ? CARDIAC CATHETERIZATION  01/22/2013  ? Diffuse borderline residual CAD consistent with uncontrolled diabetes, medical management recommended  ? CORONARY ANGIOPLASTY WITH STENT PLACEMENT  01/09/2013  ? 30% pLAD, 30% mLAD, 70% pLCx, RCA occlusion at crux with R->L distal collaterals s/p DES; LVEF 50%, moderate-severe HK of inferior basal wall  ? EYE SURGERY Bilateral   ? LEFT HEART CATHETERIZATION WITH CORONARY ANGIOGRAM N/A 01/09/2013  ? Procedure: LEFT HEART CATHETERIZATION WITH CORONARY ANGIOGRAM;  Surgeon: Peter M Martinique, MD;  Location: [facility] CATH LAB;  Service: Cardiovascular;  Laterality: N/A;  ? LEFT HEART CATHETERIZATION WITH CORONARY ANGIOGRAM N/A 01/22/2013  ? Procedure: LEFT HEART CATHETERIZATION WITH CORONARY ANGIOGRAM;  Surgeon: Sherren Mocha, MD;  Location: [facility] CATH LAB;  Service: Cardiovascular;  Laterality: N/A;  ? ? ?Family History  ?Problem Relation Age of Onset  ? Heart disease Mother   ? Heart disease Father   ? Heart disease Brother   ? Heart disease Brother   ? Colon cancer Neg Hx   ? Colon polyps Neg Hx   ? ? ?Social History  ? ?Socioeconomic History  ? Marital status: Married  ?  Spouse name: Leatrice Jewels Raggio  ? Number of children: 1  ? Years of education: 26  ? Highest education level: Associate degree: occupational, Hotel manager, or vocational program  ?Occupational History  ? Occupation: disabled  ?  Comment: machinist   ?Tobacco Use  ? Smoking status: Never  ? Smokeless tobacco: Never  ?Vaping Use  ? Vaping Use: Never used  ?Substance and Sexual Activity  ? Alcohol use: Yes  ?  Comment: 4 times per year-wine or beer  ? Drug use: No  ? Sexual activity: Yes  ?  Birth control/protection: None  ?Other Topics Concern  ? Not on file  ?Social History Narrative  ? Spanish-speaking. Limited English.   ? ?Social Determinants of Health  ? ?Financial Resource Strain: Not on file  ?Food Insecurity: No Food Insecurity  ? Worried About Charity fundraiser in the Last Year: Never true  ? Ran Out of Food in the Last Year: Never true  ?  Transportation Needs: No Transportation Needs  ? Lack of Transportation (Medical): No  ? Lack of Transportation (Non-Medical): No  ?Physical Activity: Sufficiently Active  ? Days of Exercise per Week: 5 days  ? Minutes of Exercise per Session: 30 min  ?Stress: No Stress Concern Present  ? Feeling of Stress : Not at all  ?Social Connections: Moderately Integrated  ? Frequency of Communication with Friends and Family: More than three times a week  ? Frequency of Social Gatherings with Friends and Family: More than three times a week  ? Attends Religious Services: 1 to 4 times per year  ? Active Member of Clubs or Organizations: No  ? Attends Archivist Meetings: Never  ? Marital Status: Married  ?Intimate Partner  Violence: Not At Risk  ? Fear of Current or Ex-Partner: No  ? Emotionally Abused: No  ? Physically Abused: No  ? Sexually Abused: No  ? ? ?Outpatient Medications Prior to Visit  ?Medication Sig Dispense Refill  ? allopurinol (ZYLOPRIM) 300 MG tablet TAKE ONE TABLET BY MOUTH ONCE DAILY AS NEEDED FOR GOUT prevention 90 tablet 3  ? aspirin EC 81 MG EC tablet Take 1 tablet (81 mg total) by mouth daily.    ? atorvastatin (LIPITOR) 80 MG tablet TAKE 1 TABLET BY MOUTH ONCE DAILY AT 6 PM FOR CHOLESTEROL 90 tablet 3  ? B Complex-C (B-COMPLEX WITH VITAMIN C) tablet Take 1 tablet by mouth as needed (for vitamin deficiency).    ? Blood Glucose Monitoring Suppl (PRODIGY VOICE BLOOD GLUCOSE) w/Device KIT Use to test blood sugar up to 6 times daily. DX:E11.9 Patient needs talking glucometer 1 kit 0  ? brimonidine (ALPHAGAN) 0.2 % ophthalmic solution Place 1 drop into both eyes 3 (three) times daily. Give 90-day supply at a time 15 mL 3  ? candesartan (ATACAND) 16 MG tablet Take 1 tablet (16 mg total) by mouth in the morning and at bedtime. 180 tablet 3  ? cholecalciferol (VITAMIN D3) 25 MCG (1000 UNIT) tablet Take 1,000 Units by mouth daily.    ? dorzolamide-timolol (COSOPT) 22.3-6.8 MG/ML ophthalmic solution 1 DROP BOTH EYES TWICE A DAY. SEPARATE BY AT LEAST 10 MINUTES FROM OTHER PRESSURE REDUCING EYE DROPS 20 mL 5  ? furosemide (LASIX) 20 MG tablet TAKE 1 TABLET BY MOUTH EVERY DAY AS NEEDED 90 tablet 0  ? glipiZIDE (GLUCOTROL) 5 MG tablet Take 1 tablet (5 mg total) by mouth daily before breakfast. 90 tablet 3  ? glucose blood (ONETOUCH VERIO) test strip USE 1 STRIP TO CHECK GLUCOSE 4 TIMES DAILY DX E13.42 400 strip 3  ? insulin aspart protamine- aspart (NOVOLOG MIX 70/30) (70-30) 100 UNIT/ML injection 44 units before breakfast and 40 units before supper daily 90 mL 3  ? latanoprost (XALATAN) 0.005 % ophthalmic solution INSTILL 1 DROP IN BOTH EYES EVERY EVENING 7.5 mL 3  ? levothyroxine (SYNTHROID) 50 MCG tablet Take 1 tablet (50  mcg total) by mouth daily. 90 tablet 3  ? meloxicam (MOBIC) 15 MG tablet Take 1 tablet by mouth daily.    ? metFORMIN (GLUCOPHAGE) 1000 MG tablet Take 1 tablet (1,000 mg total) by mouth 2 (two) times daily with a meal. 180 tablet 3  ? nitroGLYCERIN (NITROSTAT) 0.4 MG SL tablet Place 1 tablet (0.4 mg total) under the tongue every 5 (five) minutes x 3 doses as needed for chest pain. 75 tablet 3  ? tamsulosin (FLOMAX) 0.4 MG CAPS capsule Take 0.4 mg by mouth daily.    ? ?  No facility-administered medications prior to visit.  ? ? ?Allergies  ?Allergen Reactions  ? Levemir [Insulin Detemir] Swelling  ? Lisinopril Cough  ? Trazodone And Nefazodone   ? Penicillins Rash  ?  Lip swelling  ? ? ?Review of Systems ?As per HPI.  ?   ?Objective:  ?  ?Physical Exam ?Vitals and nursing note reviewed.  ?Constitutional:   ?   Appearance: He is not toxic-appearing or diaphoretic.  ?Cardiovascular:  ?   Rate and Rhythm: Normal rate and regular rhythm.  ?Pulmonary:  ?   [place]: Pulmonary effort is normal. No respiratory distress.  ?   Breath sounds: Normal breath sounds.  ?Abdominal:  ?   General: Bowel sounds are decreased. There is no distension.  ?   Palpations: Abdomen is soft.  ?   Tenderness: There is abdominal tenderness in the suprapubic area. There is no right CVA tenderness, left CVA tenderness, guarding or rebound. Negative signs include Murphy's sign and McBurney's sign.  ?Skin: ?   General: Skin is warm and dry.  ?Neurological:  ?   Mental Status: He is alert and oriented to person, place, and time.  ?Psychiatric:     ?   Mood and Affect: Mood normal.     ?   Behavior: Behavior normal.  ? ? ?BP (!) 147/82   Pulse 92   Temp 98 ?F (36.7 ?C) (Temporal)   Ht _0  (1.676 m)   Wt 160 lb 12.8 oz (72.9 kg)   BMI 25.95 kg/m?  ?Wt Readings from Last 3 Encounters:  ?01/22/22 160 lb 12.8 oz (72.9 kg)  ?01/18/22 164 lb (74.4 kg)  ?01/15/22 188 lb (85.3 kg)  ? ? ?There are no preventive care reminders to display for this  patient. ? ?There are no preventive care reminders to display for this patient. ? ? ?Lab Results  ?Component Value Date  ? TSH 1.650 01/18/2022  ? ?Lab Results  ?Component Value Date  ? WBC 7.0 01/18/2022  ? HGB 14.8 01/19/19

## 2022-01-22 NOTE — H&P (Addendum)
?  ?  ?[place] ? ? ?PATIENT NAME: Ethan Peters   ? ?MR#:  [id number] ? ?DATE OF BIRTH:  [birth year] ? ?DATE OF ADMISSION:  01/22/2022 ? ?PRIMARY CARE PHYSICIAN: Dettinger, Fransisca Kaufmann, MD  ? ?Patient is coming from: Home ? ?REQUESTING/REFERRING PHYSICIAN: Donnamarie Poag, MD  ? ?CHIEF COMPLAINT:  ? ?Chief Complaint  ?Patient presents with  ? Abdominal Pain  ?  Illeus  ? ? ?HISTORY OF PRESENT ILLNESS:  ?Ethan Peters is a 66 y.o. Caucasian male with medical history significant for coronary artery disease, diabetes mellitus, gout, hypertension, dyslipidemia, ischemic or myopathy and hypothyroidism, who presented to the ER with acute onset of diffuse abdominal distention with associated abdominal pain, nausea over the last 4 days.  He did not have a bowel movement for 4 days.  He has been compliant with his insulin however missed it earlier today.  He was recently in [place] and got back 10 days ago.  Due to short insulin supply while he was in [place] he was not able to follow his insulin regimen.  He denied any vomiting or diarrhea.  No fever or chills.  No dysuria, oliguria or hematuria or flank pain.  No headache or dizziness or blurred vision.  He denies any dysuria, oliguria or hematuria or flank pain.  No cough or wheezing or hemoptysis.  No chest pain or palpitations. ? ?ED Course: Upon presentation to the emergency room BP was 147/82 with otherwise normal vital signs.  Labs revealed hyponatremia of 125 and hypochloremia of 93 with a CO2 of 13 anion gap of 19 and blood glucose of 402 with a creatinine 1.78 up from 1.064 days ago, albumin 3.4 and total bili 1.7.  CBC showed leukocytosis of 17.8 with neutrophilia and lactic acid was 2.3.   ? ?Imaging: Abdomen x-ray showed gaseous distention of both large and small bowel without clear evidence of obstruction with findings suggestive of ileus.  It showed moderate colonic stool burden with no evidence for rectal fecal impaction. ? ?The patient was  given 1 L bolus of IV lactated Ringer, 4 mg of IV morphine sulfate and 4 mg of IV Zofran as well as started on IV insulin drip per DKA protocol. ?PAST MEDICAL HISTORY:  ? ?Past Medical History:  ?Diagnosis Date  ? CAD (coronary artery disease) 01/09/13  ? Inferior STEMI s/p DES-RCA  ? Cataract   ? Diabetes mellitus   ? Uncontrolled, Hgb A1C 14.8% on 03/14, started on insulin  ? Gout   ? Hyperlipidemia   ? Hypertension   ? Ischemic cardiomyopathy   ? EF 65-99%, grade 1 diastolic dysfunction, mildly dilated RV, RA at the upper limits of normal, mild TR, PA systolic pressure 32 mm mercury and hypokinesis to akinesis of the basal mid inferior myocardium  ? Myocardial infarction ([place]) 01/09/2013  ? Shortness of breath   ? Thyroid disease   ? ? ?PAST SURGICAL HISTORY:  ? ?Past Surgical History:  ?Procedure Laterality Date  ? CARDIAC CATHETERIZATION  01/22/2013  ? Diffuse borderline residual CAD consistent with uncontrolled diabetes, medical management recommended  ? CORONARY ANGIOPLASTY WITH STENT PLACEMENT  01/09/2013  ? 30% pLAD, 30% mLAD, 70% pLCx, RCA occlusion at crux with R->L distal collaterals s/p DES; LVEF 50%, moderate-severe HK of inferior basal wall  ? EYE SURGERY Bilateral   ? LEFT HEART CATHETERIZATION WITH CORONARY ANGIOGRAM N/A 01/09/2013  ? Procedure: LEFT HEART CATHETERIZATION WITH CORONARY ANGIOGRAM;  Surgeon: Peter M Martinique, MD;  Location:  [facility] CATH LAB;  Service: Cardiovascular;  Laterality: N/A;  ? LEFT HEART CATHETERIZATION WITH CORONARY ANGIOGRAM N/A 01/22/2013  ? Procedure: LEFT HEART CATHETERIZATION WITH CORONARY ANGIOGRAM;  Surgeon: Sherren Mocha, MD;  Location: [facility] CATH LAB;  Service: Cardiovascular;  Laterality: N/A;  ? ? ?SOCIAL HISTORY:  ? ?Social History  ? ?Tobacco Use  ? Smoking status: Never  ? Smokeless tobacco: Never  ?Substance Use Topics  ? Alcohol use: Yes  ?  Comment: 4 times per year-wine or beer  ? ? ?FAMILY HISTORY:  ? ?Family History  ?Problem Relation Age of Onset  ? Heart disease  Mother   ? Heart disease Father   ? Heart disease Brother   ? Heart disease Brother   ? Colon cancer Neg Hx   ? Colon polyps Neg Hx   ? ? ?DRUG ALLERGIES:  ? ?Allergies  ?Allergen Reactions  ? Levemir [Insulin Detemir] Swelling  ? Lisinopril Cough  ? Trazodone And Nefazodone   ? Penicillins Rash  ?  Lip swelling  ? ? ?REVIEW OF SYSTEMS:  ? ?ROS ?As per history of present illness. All pertinent systems were reviewed above. Constitutional, HEENT, cardiovascular, respiratory, GI, GU, musculoskeletal, neuro, psychiatric, endocrine, integumentary and hematologic systems were reviewed and are otherwise negative/unremarkable except for positive findings mentioned above in the HPI. ? ? ?MEDICATIONS AT HOME:  ? ?Prior to Admission medications   ?Medication Sig Start Date End Date Taking? Authorizing Provider  ?allopurinol (ZYLOPRIM) 300 MG tablet TAKE ONE TABLET BY MOUTH ONCE DAILY AS NEEDED FOR GOUT prevention 10/26/20   Dettinger, Fransisca Kaufmann, MD  ?aspirin EC 81 MG EC tablet Take 1 tablet (81 mg total) by mouth daily. 01/14/13   Arguello, Roger A, PA-C  ?atorvastatin (LIPITOR) 80 MG tablet TAKE 1 TABLET BY MOUTH ONCE DAILY AT 6 PM FOR CHOLESTEROL 10/26/20   Dettinger, Fransisca Kaufmann, MD  ?B Complex-C (B-COMPLEX WITH VITAMIN C) tablet Take 1 tablet by mouth as needed (for vitamin deficiency).    [provider]  ?Blood Glucose Monitoring Suppl (PRODIGY VOICE BLOOD GLUCOSE) w/Device KIT Use to test blood sugar up to 6 times daily. DX:E11.9 Patient needs talking glucometer 07/08/20   Dettinger, Fransisca Kaufmann, MD  ?brimonidine (ALPHAGAN) 0.2 % ophthalmic solution Place 1 drop into both eyes 3 (three) times daily. Give 90-day supply at a time 07/26/21   Dettinger, Fransisca Kaufmann, MD  ?candesartan (ATACAND) 16 MG tablet Take 1 tablet (16 mg total) by mouth in the morning and at bedtime. 10/26/20   Dettinger, Fransisca Kaufmann, MD  ?cholecalciferol (VITAMIN D3) 25 MCG (1000 UNIT) tablet Take 1,000 Units by mouth daily.    [provider]   ?dorzolamide-timolol (COSOPT) 22.3-6.8 MG/ML ophthalmic solution 1 DROP BOTH EYES TWICE A DAY. SEPARATE BY AT LEAST 10 MINUTES FROM OTHER PRESSURE REDUCING EYE DROPS 07/26/21   Dettinger, Fransisca Kaufmann, MD  ?furosemide (LASIX) 20 MG tablet TAKE 1 TABLET BY MOUTH EVERY DAY AS NEEDED 10/17/21   Dettinger, Fransisca Kaufmann, MD  ?glipiZIDE (GLUCOTROL) 5 MG tablet Take 1 tablet (5 mg total) by mouth daily before breakfast. 01/18/22   Dettinger, Fransisca Kaufmann, MD  ?glucose blood (ONETOUCH VERIO) test strip USE 1 STRIP TO CHECK GLUCOSE 4 TIMES DAILY DX E13.42 04/24/21   Dettinger, Fransisca Kaufmann, MD  ?insulin aspart protamine- aspart (NOVOLOG MIX 70/30) (70-30) 100 UNIT/ML injection 44 units before breakfast and 40 units before supper daily 01/18/22   Dettinger, Fransisca Kaufmann, MD  ?latanoprost (XALATAN) 0.005 % ophthalmic solution INSTILL 1 DROP  IN BOTH EYES EVERY EVENING 08/07/21   Dettinger, Fransisca Kaufmann, MD  ?levothyroxine (SYNTHROID) 50 MCG tablet Take 1 tablet (50 mcg total) by mouth daily. 10/26/20   Dettinger, Fransisca Kaufmann, MD  ?meloxicam (MOBIC) 15 MG tablet Take 1 tablet by mouth daily. 04/02/21   [provider]  ?metFORMIN (GLUCOPHAGE) 1000 MG tablet Take 1 tablet (1,000 mg total) by mouth 2 (two) times daily with a meal. 01/18/22   Dettinger, Fransisca Kaufmann, MD  ?nitroGLYCERIN (NITROSTAT) 0.4 MG SL tablet Place 1 tablet (0.4 mg total) under the tongue every 5 (five) minutes x 3 doses as needed for chest pain. 07/27/19   Claretta Fraise, MD  ?tamsulosin (FLOMAX) 0.4 MG CAPS capsule Take 0.4 mg by mouth daily. 03/06/21   [provider]  ? ?  ? ?VITAL SIGNS:  ?Blood pressure 102/67, pulse 92, temperature (!) 100.5 ?F (38.1 ?C), temperature source Oral, resp. rate (!) 23, height '5\' 6"'  (1.676 m), weight 72.6 kg, SpO2 94 %. ? ?PHYSICAL EXAMINATION:  ?Physical Exam ? ?GENERAL:  66 y.o.-year-old Hispanic American male patient lying in the bed with no acute distress.  ?EYES: Pupils equal, round, reactive to light.Marland Kitchen  He is blind in both eyes.  No scleral  icterus. Extraocular muscles intact.  ?HEENT: Head atraumatic, normocephalic. Oropharynx and nasopharynx clear.  ?NECK:  Supple, no jugular venous distention. No thyroid enlargement, no tenderness.  ?LUNGS: Normal

## 2022-01-22 NOTE — ED Triage Notes (Signed)
Patient from home, complaint of abdominal pain since 4/7, confirmed illeus at dr today. ?

## 2022-01-22 NOTE — ED Provider Notes (Signed)
?[place] ?Provider Note ? ? ?CSN: [id number] ?Arrival date & time: 01/22/22  1624 ? ?  ? ?History ? ?Chief Complaint  ?Patient presents with  ? Abdominal Pain  ?  Illeus  ? ? ?Ethan Peters is a 66 y.o. male. ? ? ?Abdominal Pain ? ?Patient is a 66 year old male with past medical history of type 2 diabetes on insulin who presents to the emergency department for 4 days diffuse abdominal distention associated with abdominal pain, nausea and not passing flatulence.  Patient report he has not had any bowel movement for the past 4 days.  He reports being compliant with his insulin however missed his first dose today.  He just got back from [place] 10 days ago.  While in [place], he has been having hard time following his insulin regimen because of short supply.  He currently complains about nausea and denies any episode of emesis.  He denies any episode of diarrhea.  Describes the pain as diffuse and worse at night.  Denies any injury to the abdomen.  Denies associated chest pain or shortness of breath.  He reported he has been having hard time eating and has just been drinking water.  Denies any urinary symptoms.  Otherwise no other complaints. ? ?Home Medications ?Prior to Admission medications   ?Medication Sig Start Date End Date Taking? Authorizing Provider  ?allopurinol (ZYLOPRIM) 300 MG tablet TAKE ONE TABLET BY MOUTH ONCE DAILY AS NEEDED FOR GOUT prevention 10/26/20   Dettinger, Fransisca Kaufmann, MD  ?aspirin EC 81 MG EC tablet Take 1 tablet (81 mg total) by mouth daily. 01/14/13   Arguello, Roger A, PA-C  ?atorvastatin (LIPITOR) 80 MG tablet TAKE 1 TABLET BY MOUTH ONCE DAILY AT 6 PM FOR CHOLESTEROL 10/26/20   Dettinger, Fransisca Kaufmann, MD  ?B Complex-C (B-COMPLEX WITH VITAMIN C) tablet Take 1 tablet by mouth as needed (for vitamin deficiency).    [provider]  ?Blood Glucose Monitoring Suppl (PRODIGY VOICE BLOOD GLUCOSE) w/Device KIT Use to test blood sugar up to 6  times daily. DX:E11.9 Patient needs talking glucometer 07/08/20   Dettinger, Fransisca Kaufmann, MD  ?brimonidine (ALPHAGAN) 0.2 % ophthalmic solution Place 1 drop into both eyes 3 (three) times daily. Give 90-day supply at a time 07/26/21   Dettinger, Fransisca Kaufmann, MD  ?candesartan (ATACAND) 16 MG tablet Take 1 tablet (16 mg total) by mouth in the morning and at bedtime. 10/26/20   Dettinger, Fransisca Kaufmann, MD  ?cholecalciferol (VITAMIN D3) 25 MCG (1000 UNIT) tablet Take 1,000 Units by mouth daily.    [provider]  ?dorzolamide-timolol (COSOPT) 22.3-6.8 MG/ML ophthalmic solution 1 DROP BOTH EYES TWICE A DAY. SEPARATE BY AT LEAST 10 MINUTES FROM OTHER PRESSURE REDUCING EYE DROPS 07/26/21   Dettinger, Fransisca Kaufmann, MD  ?furosemide (LASIX) 20 MG tablet TAKE 1 TABLET BY MOUTH EVERY DAY AS NEEDED 10/17/21   Dettinger, Fransisca Kaufmann, MD  ?glipiZIDE (GLUCOTROL) 5 MG tablet Take 1 tablet (5 mg total) by mouth daily before breakfast. 01/18/22   Dettinger, Fransisca Kaufmann, MD  ?glucose blood (ONETOUCH VERIO) test strip USE 1 STRIP TO CHECK GLUCOSE 4 TIMES DAILY DX E13.42 04/24/21   Dettinger, Fransisca Kaufmann, MD  ?insulin aspart protamine- aspart (NOVOLOG MIX 70/30) (70-30) 100 UNIT/ML injection 44 units before breakfast and 40 units before supper daily 01/18/22   Dettinger, Fransisca Kaufmann, MD  ?latanoprost (XALATAN) 0.005 % ophthalmic solution INSTILL 1 DROP IN BOTH EYES EVERY EVENING 08/07/21   Dettinger,  Fransisca Kaufmann, MD  ?levothyroxine (SYNTHROID) 50 MCG tablet Take 1 tablet (50 mcg total) by mouth daily. 10/26/20   Dettinger, Fransisca Kaufmann, MD  ?meloxicam (MOBIC) 15 MG tablet Take 1 tablet by mouth daily. 04/02/21   [provider]  ?metFORMIN (GLUCOPHAGE) 1000 MG tablet Take 1 tablet (1,000 mg total) by mouth 2 (two) times daily with a meal. 01/18/22   Dettinger, Fransisca Kaufmann, MD  ?nitroGLYCERIN (NITROSTAT) 0.4 MG SL tablet Place 1 tablet (0.4 mg total) under the tongue every 5 (five) minutes x 3 doses as needed for chest pain. 07/27/19   Claretta Fraise, MD  ?tamsulosin  (FLOMAX) 0.4 MG CAPS capsule Take 0.4 mg by mouth daily. 03/06/21   [provider]  ?   ? ?Allergies    ?Levemir [insulin detemir], Lisinopril, Trazodone and nefazodone, and Penicillins   ? ?Review of Systems   ?Review of Systems  ?Gastrointestinal:  Positive for abdominal pain.  ? ?Physical Exam ?Updated Vital Signs ?BP (!) 163/89   Pulse 97   Temp 98.3 ?F (36.8 ?C) (Oral)   Resp (!) 21   Ht 5' 6" (1.676 m)   Wt 72.6 kg   SpO2 98%   BMI 25.82 kg/m?  ?Physical Exam ? ?ED Results / Procedures / Treatments   ?Labs ?(all labs ordered are listed, but only abnormal results are displayed) ?Labs Reviewed  ?CBC WITH DIFFERENTIAL/PLATELET - Abnormal; Notable for the following components:  ?    Result Value  ? WBC 17.8 (*)   ? Neutro Abs 15.7 (*)   ? Monocytes Absolute 1.1 (*)   ? Abs Immature Granulocytes 0.12 (*)   ? All other components within normal limits  ?COMPREHENSIVE METABOLIC PANEL - Abnormal; Notable for the following components:  ? Sodium 125 (*)   ? Chloride 93 (*)   ? CO2 13 (*)   ? Glucose, Bld 400 (*)   ? Creatinine, Ser 1.78 (*)   ? Albumin 3.4 (*)   ? Total Bilirubin 1.7 (*)   ? GFR, Estimated 42 (*)   ? Anion gap 19 (*)   ? All other components within normal limits  ?LACTIC ACID, PLASMA - Abnormal; Notable for the following components:  ? Lactic Acid, Venous 2.3 (*)   ? All other components within normal limits  ?LACTIC ACID, PLASMA - Abnormal; Notable for the following components:  ? Lactic Acid, Venous 2.2 (*)   ? All other components within normal limits  ?BASIC METABOLIC PANEL - Abnormal; Notable for the following components:  ? Sodium 126 (*)   ? Chloride 95 (*)   ? CO2 15 (*)   ? Glucose, Bld 407 (*)   ? Creatinine, Ser 1.75 (*)   ? Calcium 8.7 (*)   ? GFR, Estimated 42 (*)   ? Anion gap 16 (*)   ? All other components within normal limits  ?CBC WITH DIFFERENTIAL/PLATELET - Abnormal; Notable for the following components:  ? WBC 18.5 (*)   ? Neutro Abs 16.1 (*)   ? Monocytes Absolute  1.2 (*)   ? Abs Immature Granulocytes 0.09 (*)   ? All other components within normal limits  ?CBG MONITORING, ED - Abnormal; Notable for the following components:  ? Glucose-Capillary 391 (*)   ? All other components within normal limits  ?CBG MONITORING, ED - Abnormal; Notable for the following components:  ? Glucose-Capillary 378 (*)   ? All other components within normal limits  ?I-STAT VENOUS BLOOD GAS, ED - Abnormal; Notable for  the following components:  ? pCO2, Ven 27.4 (*)   ? pO2, Ven 136 (*)   ? Bicarbonate 15.7 (*)   ? TCO2 16 (*)   ? Acid-base deficit 8.0 (*)   ? Sodium 127 (*)   ? Calcium, Ion 1.05 (*)   ? All other components within normal limits  ?LIPASE, BLOOD  ?BLOOD GAS, VENOUS  ?BASIC METABOLIC PANEL  ?BASIC METABOLIC PANEL  ?BASIC METABOLIC PANEL  ?BETA-HYDROXYBUTYRIC ACID  ?BETA-HYDROXYBUTYRIC ACID  ?URINALYSIS, ROUTINE W REFLEX MICROSCOPIC  ?HIV ANTIBODY (ROUTINE TESTING W REFLEX)  ?CBC  ?BASIC METABOLIC PANEL  ?BASIC METABOLIC PANEL  ? ? ?EKG ?None ? ?Radiology ?DG Abd 1 View ? ?Result Date: 01/22/2022 ?CLINICAL DATA:  Constipation, abdominal pain EXAM: ABDOMEN - 1 VIEW COMPARISON:  None. FINDINGS: Mild gaseous distension of both large and small bowel without overt obstruction. The transverse colon is the most dilated. Formed stool present in the right colon and transverse colon. No significant rectal stool ball. No acute osseous abnormality. IMPRESSION: Gaseous distension of both large and small bowel without clear evidence of obstruction. Findings are suggestive of ileus. Moderate colonic stool burden. No evidence of rectal fecal impaction. Electronically Signed   By: Jacqulynn Cadet M.D.   On: 01/22/2022 15:09  ? ?CT Abdomen Pelvis W Contrast ? ?Result Date: 01/22/2022 ?CLINICAL DATA:  Bowel obstruction suspected EXAM: CT ABDOMEN AND PELVIS WITH CONTRAST TECHNIQUE: Multidetector CT imaging of the abdomen and pelvis was performed using the standard protocol following bolus administration of  intravenous contrast. RADIATION DOSE REDUCTION: This exam was performed according to the departmental dose-optimization program which includes automated exposure control, adjustment of the mA and/or kV accordin

## 2022-01-23 ENCOUNTER — Inpatient Hospital Stay (HOSPITAL_COMMUNITY): Payer: PPO

## 2022-01-23 DIAGNOSIS — N400 Enlarged prostate without lower urinary tract symptoms: Secondary | ICD-10-CM

## 2022-01-23 DIAGNOSIS — M109 Gout, unspecified: Secondary | ICD-10-CM

## 2022-01-23 DIAGNOSIS — K567 Ileus, unspecified: Secondary | ICD-10-CM

## 2022-01-23 DIAGNOSIS — N390 Urinary tract infection, site not specified: Secondary | ICD-10-CM | POA: Diagnosis present

## 2022-01-23 DIAGNOSIS — E785 Hyperlipidemia, unspecified: Secondary | ICD-10-CM

## 2022-01-23 DIAGNOSIS — J189 Pneumonia, unspecified organism: Secondary | ICD-10-CM | POA: Diagnosis not present

## 2022-01-23 DIAGNOSIS — E039 Hypothyroidism, unspecified: Secondary | ICD-10-CM

## 2022-01-23 DIAGNOSIS — E1110 Type 2 diabetes mellitus with ketoacidosis without coma: Secondary | ICD-10-CM | POA: Diagnosis not present

## 2022-01-23 DIAGNOSIS — N179 Acute kidney failure, unspecified: Secondary | ICD-10-CM | POA: Diagnosis not present

## 2022-01-23 LAB — BASIC METABOLIC PANEL
Anion gap: 11 (ref 5–15)
Anion gap: 8 (ref 5–15)
Anion gap: 9 (ref 5–15)
BUN: 19 mg/dL (ref 8–23)
BUN: 20 mg/dL (ref 8–23)
BUN: 15 mg/dL (ref 8–23)
CO2: 19 mmol/L — ABNORMAL LOW (ref 22–32)
CO2: 23 mmol/L (ref 22–32)
CO2: 22 mmol/L (ref 22–32)
Calcium: 8.6 mg/dL — ABNORMAL LOW (ref 8.9–10.3)
Calcium: 8.6 mg/dL — ABNORMAL LOW (ref 8.9–10.3)
Calcium: 8.5 mg/dL — ABNORMAL LOW (ref 8.9–10.3)
Chloride: 99 mmol/L (ref 98–111)
Chloride: 100 mmol/L (ref 98–111)
Chloride: 100 mmol/L (ref 98–111)
Creatinine, Ser: 1.65 mg/dL — ABNORMAL HIGH (ref 0.61–1.24)
Creatinine, Ser: 1.86 mg/dL — ABNORMAL HIGH (ref 0.61–1.24)
Creatinine, Ser: 1.66 mg/dL — ABNORMAL HIGH (ref 0.61–1.24)
GFR, Estimated: 46 mL/min — ABNORMAL LOW (ref >60)
GFR, Estimated: 39 mL/min — ABNORMAL LOW (ref >60)
GFR, Estimated: 45 mL/min — ABNORMAL LOW (ref >60)
Glucose, Bld: 204 mg/dL — ABNORMAL HIGH (ref 70–99)
Glucose, Bld: 138 mg/dL — ABNORMAL HIGH (ref 70–99)
Glucose, Bld: 124 mg/dL — ABNORMAL HIGH (ref 70–99)
Potassium: 3.9 mmol/L (ref 3.5–5.1)
Potassium: 4.2 mmol/L (ref 3.5–5.1)
Potassium: 3.9 mmol/L (ref 3.5–5.1)
Sodium: 129 mmol/L — ABNORMAL LOW (ref 135–145)
Sodium: 131 mmol/L — ABNORMAL LOW (ref 135–145)
Sodium: 131 mmol/L — ABNORMAL LOW (ref 135–145)

## 2022-01-23 LAB — CBC
HCT: 35.9 % — ABNORMAL LOW (ref 39.0–52.0)
Hemoglobin: 12.4 g/dL — ABNORMAL LOW (ref 13.0–17.0)
MCH: 32.7 pg (ref 26.0–34.0)
MCHC: 34.5 g/dL (ref 30.0–36.0)
MCV: 94.7 fL (ref 80.0–100.0)
Platelets: 235 10*3/uL (ref 150–400)
RBC: 3.79 MIL/uL — ABNORMAL LOW (ref 4.22–5.81)
RDW: 11.9 % (ref 11.5–15.5)
WBC: 18.3 10*3/uL — ABNORMAL HIGH (ref 4.0–10.5)
nRBC: 0 % (ref 0.0–0.2)

## 2022-01-23 LAB — RESP PANEL BY RT-PCR (FLU A&B, COVID) ARPGX2
Influenza A by PCR: NEGATIVE
Influenza B by PCR: NEGATIVE
SARS Coronavirus 2 by RT PCR: NEGATIVE

## 2022-01-23 LAB — URINALYSIS, COMPLETE (UACMP) WITH MICROSCOPIC
Bilirubin Urine: NEGATIVE
Glucose, UA: 50 mg/dL — AB
Ketones, ur: NEGATIVE mg/dL
Nitrite: NEGATIVE
Protein, ur: 30 mg/dL — AB
Specific Gravity, Urine: 1.018 (ref 1.005–1.030)
WBC, UA: >50 WBC/hpf — ABNORMAL HIGH (ref 0–5)
pH: 6 (ref 5.0–8.0)

## 2022-01-23 LAB — GLUCOSE, CAPILLARY
Glucose-Capillary: 58 mg/dL — ABNORMAL LOW (ref 70–99)
Glucose-Capillary: 80 mg/dL (ref 70–99)

## 2022-01-23 LAB — CBG MONITORING, ED
Glucose-Capillary: 103 mg/dL — ABNORMAL HIGH (ref 70–99)
Glucose-Capillary: 127 mg/dL — ABNORMAL HIGH (ref 70–99)
Glucose-Capillary: 142 mg/dL — ABNORMAL HIGH (ref 70–99)
Glucose-Capillary: 161 mg/dL — ABNORMAL HIGH (ref 70–99)
Glucose-Capillary: 162 mg/dL — ABNORMAL HIGH (ref 70–99)
Glucose-Capillary: 170 mg/dL — ABNORMAL HIGH (ref 70–99)
Glucose-Capillary: 184 mg/dL — ABNORMAL HIGH (ref 70–99)
Glucose-Capillary: 196 mg/dL — ABNORMAL HIGH (ref 70–99)
Glucose-Capillary: 232 mg/dL — ABNORMAL HIGH (ref 70–99)
Glucose-Capillary: 233 mg/dL — ABNORMAL HIGH (ref 70–99)
Glucose-Capillary: 83 mg/dL (ref 70–99)

## 2022-01-23 LAB — PROCALCITONIN: Procalcitonin: 1.78 ng/mL

## 2022-01-23 LAB — BETA-HYDROXYBUTYRIC ACID
Beta-Hydroxybutyric Acid: 0.3 mmol/L — ABNORMAL HIGH (ref 0.05–0.27)
Beta-Hydroxybutyric Acid: 0.1 mmol/L (ref 0.05–0.27)

## 2022-01-23 LAB — HIV ANTIBODY (ROUTINE TESTING W REFLEX): HIV Screen 4th Generation wRfx: NONREACTIVE

## 2022-01-23 MED ORDER — INSULIN ASPART PROT & ASPART (70-30 MIX) 100 UNIT/ML ~~LOC~~ SUSP
40.0000 [IU] | Freq: Two times a day (BID) | SUBCUTANEOUS | Status: DC
  Administered 2022-01-23: 40 [IU] via SUBCUTANEOUS
  Filled 2022-01-23: qty 10

## 2022-01-23 MED ORDER — CEFTRIAXONE SODIUM 1 G IJ SOLR
1.0000 g | INTRAMUSCULAR | Status: DC
  Administered 2022-01-23 – 2022-01-25 (×3): 1 g via INTRAVENOUS
  Filled 2022-01-23 (×3): qty 10

## 2022-01-23 MED ORDER — INSULIN ASPART 100 UNIT/ML IJ SOLN
0.0000 [IU] | Freq: Three times a day (TID) | INTRAMUSCULAR | Status: DC
  Administered 2022-01-23: 3 [IU] via SUBCUTANEOUS

## 2022-01-23 MED ORDER — SODIUM CHLORIDE 0.9 % IV SOLN: INTRAVENOUS | Status: DC

## 2022-01-23 MED ORDER — INSULIN ASPART 100 UNIT/ML IJ SOLN
0.0000 [IU] | INTRAMUSCULAR | Status: DC
  Administered 2022-01-24: 1 [IU] via SUBCUTANEOUS

## 2022-01-23 MED ORDER — SODIUM CHLORIDE 0.9 % IV SOLN
500.0000 mg | INTRAVENOUS | Status: DC
  Administered 2022-01-23 – 2022-01-25 (×3): 500 mg via INTRAVENOUS
  Filled 2022-01-23 (×3): qty 5

## 2022-01-23 MED ORDER — INSULIN ASPART PROT & ASPART (70-30 MIX) 100 UNIT/ML ~~LOC~~ SUSP
30.0000 [IU] | Freq: Two times a day (BID) | SUBCUTANEOUS | Status: DC
  Administered 2022-01-23: 30 [IU] via SUBCUTANEOUS
  Filled 2022-01-23: qty 10

## 2022-01-23 MED ORDER — INSULIN ASPART PROT & ASPART (70-30 MIX) 100 UNIT/ML ~~LOC~~ SUSP
25.0000 [IU] | Freq: Two times a day (BID) | SUBCUTANEOUS | Status: DC
  Administered 2022-01-24: 25 [IU] via SUBCUTANEOUS
  Filled 2022-01-23: qty 10

## 2022-01-23 MED ORDER — DEXTROSE 50 % IV SOLN: 12.5000 g | Freq: Every day | INTRAVENOUS | Status: DC | PRN

## 2022-01-23 NOTE — Progress Notes (Signed)
Overnight progress note ? ?Patient continues to be febrile.  Tachycardia has improved and not hypotensive.  Lactic acid 2.3 >2.2.  WBC 18.5.  CT abdomen pelvis negative for acute infectious etiology.  Blood cultures and UA ordered.  Check procalcitonin level.  Continue Tylenol as needed for fevers. ?

## 2022-01-23 NOTE — ED Notes (Signed)
Check bs at 0734 ?

## 2022-01-23 NOTE — Assessment & Plan Note (Addendum)
Chest x-ray she suggests left lower lobe bronchopneumonia.  Patient did have a cough.  COVID-19 and influenza PCR negative.   ?Recent travel from [place] noted.   ?WBC has improved.  Changed over to levofloxacin.  ?

## 2022-01-23 NOTE — Assessment & Plan Note (Addendum)
Ileus was noted on abdominal film from 4/10.  A CT scan was done subsequently which did not show any evidence for bowel obstruction. ?No further episodes of vomiting.  Abdomen remains benign.  ?

## 2022-01-23 NOTE — ED Notes (Signed)
Pt given orange juice due to CBG 83 ?

## 2022-01-23 NOTE — Assessment & Plan Note (Addendum)
-   Continue Flomax 

## 2022-01-23 NOTE — Assessment & Plan Note (Addendum)
Continue statin. 

## 2022-01-23 NOTE — Assessment & Plan Note (Addendum)
Continue levothyroxine 

## 2022-01-23 NOTE — Progress Notes (Addendum)
Inpatient Diabetes Program Recommendations ? ?AACE/ADA: New Consensus Statement on Inpatient Glycemic Control (2015) ? ?Target Ranges:  Prepandial:   less than 140 mg/dL ?     Peak postprandial:   less than 180 mg/dL (1-2 hours) ?     Critically ill patients:  140 - 180 mg/dL  ? ? Latest Reference Range & Units 01/23/22 00:23 01/23/22 01:31 01/23/22 02:39 01/23/22 03:58 01/23/22 05:08 01/23/22 06:33 01/23/22 07:36 01/23/22 10:57  ?Glucose-Capillary 70 - 99 mg/dL 003 (H) 704 (H) 888 (H) 170 (H) 162 (H) 184 (H) 103 (H) 83  ? ? Latest Reference Range & Units 01/18/22 11:16 01/22/22 16:24  ?CO2 22 - 32 mmol/L 23 13 (L)  ?Glucose 70 - 99 mg/dL 916 (H) 945 (H)  ?Anion gap 5 - 15   19 (H)  ? ? Latest Reference Range & Units 07/26/21 09:05 01/18/22 11:14  ?HB A1C (BAYER DCA - WAIVED) 4.8 - 5.6 % 6.3 (H) 12.7 (H)  ? ?Review of Glycemic Control ? ?Diabetes history: DM2 ?Outpatient Diabetes medications: 70/30 44 units QAM, 40 units QPM, Glipizide 5 mg QAM, Metformin 1000 mg BID ?Current orders for Inpatient glycemic control: 70/30 40 units BID, Novolog 0-15 units TID with meals ? ?Inpatient Diabetes Program Recommendations:   ? ?Insulin: Patient received 70/30 40 units at 5:09 am today and most current glucose 83 mg/dl at 03:88 am.  Noted clear liquid diet ordered. May need to consider decreasing 70/30 if patient has any issues with hypoglycemia. ? ?HbgA1C:  A1C 12.7% on 01/18/22 indicating an average glucose of 318 mg/dl over the past 2-3 months. At time of discharge, please provide Rx for: glucose test strips (#[id number]). ? ?NOTE: Per chart, noted patient seen PCP on 01/18/22 and per office note, "Patient comes in today for recheck of his diabetes. Patient has been currently taking NovoLog 70/30, 44 AM and 40 p.m and glipizide and metformin, he has been out of them. A1c 12.7, he has been out of his insulin for at least a month and a half, he needed prescription we will send it for him." Per H&P on 01/22/21, "presented to the ER with  acute onset of diffuse abdominal distention with associated abdominal pain, nausea over the last 4 days.  He did not have a bowel movement for 4 days.  He has been compliant with his insulin however missed it earlier today.  He was recently in [place] and got back 10 days ago.  Due to short insulin supply while he was in [place] he was not able to follow his insulin regimen."  Patient was ordered IV insulin and has been transitioned to SQ insulin; received 70/30 40 units at 5:09 am today. Will plan to speak to patient today regarding DM control. ? ?Addendum 01/23/22@13 :15-Spoke with patient at bedside regarding DM control. Patient reports that he is taking 70/30 44 units QAM, 40 units QPM, Glipizide 5 mg QAM, and Metformin 1000 mg BID for DM. Patient reports that he was in [place] recently and he was not taking insulin because of insulin supply issue.  Asked about being out of insulin (as noted in PCP note on 01/18/22) and he confirms that he had been out of insulin for a month and a half. Patient also confirms that since he has been sick over past 4 days that he was not taking any DM medication because he was not able to eat. Encouraged patient to talk with his PCP about sick day guidelines and if he should adjust  insulin dose when he is not able to eat. Inquired about any issues with hypoglycemia and patient denies hypoglycemia recently. Patient reports he felt fine with glucose of 83 mg/dl this morning as well.  Patient has lunch tray (clears) at bedside untouched. Encouraged patient to try to eat/drink and patient states he is not really hungry but he may try to drink something. Stressed importance of taking DM medications consistently to decrease risk of complications from uncontrolled diabetes. Patient reports that he has plenty of insulin and oral DM medications at home. Patient states he needs Rx for test strips for glucometer. Encouraged patient to follow up with PCP regarding DM control. Patient  verbalized understanding of information and has no questions at this time.  At time of discharge, please provide Rx for: glucose test strips (#[id number]). ? ?Thanks, ?Orlando Penner, RN, MSN, CDE ?Diabetes Coordinator ?Inpatient Diabetes Program ?[phone] (Team Pager from 8am to 5pm) ? ? ? ?

## 2022-01-23 NOTE — Progress Notes (Addendum)
Dr. Loney Loh notified of FSBS of 26. PO liquids given. Recheck FSBS is 80. Per Dr. Loney Loh she will adjust insulin dosages and order hypoglycemic protocol. Pt is in no distress at this time. ?

## 2022-01-23 NOTE — Assessment & Plan Note (Addendum)
Continue allopurinol 

## 2022-01-23 NOTE — ED Notes (Signed)
Recheck bs at 0504 ?

## 2022-01-23 NOTE — Hospital Course (Addendum)
66 y.o. Caucasian male with medical history significant for coronary artery disease, diabetes mellitus, gout, hypertension, dyslipidemia, ischemic or myopathy and hypothyroidism, who presented to the ER with acute onset of diffuse abdominal distention with associated abdominal pain, nausea over 4 days.  Recently returned from [place].  Traveled back on March 31.  Noted to have fever.  Mentioned pain in his flank area.  He felt as if "his kidneys were hurting".  Found to be in DKA.  Hospitalized for further management.  Found to have a urinary tract infection and pneumonia. ?

## 2022-01-23 NOTE — Assessment & Plan Note (Addendum)
DKA, Resolved ? ?DKA likely brought on by acute illness.  Found to have a UTI and pneumonia. ?Was placed on IV insulin in the emergency department.  His anion gap closed and he was transitioned back to his usual insulin.   ?HbA1c 12.7 which implies poor compliance over the past few months. HbA1c was 6.3 in October. ?Diet was advanced.  There was some concern for diabetic gastroparesis so he was placed on IV Reglan.  Nausea has improved.  Changed over to oral. ?CBGs are much better controlled in the last 24 hours.  Tolerating his diet well.  Feels better. ?

## 2022-01-23 NOTE — Assessment & Plan Note (Addendum)
Cardiac status is stable.   ?

## 2022-01-23 NOTE — Progress Notes (Signed)
? ?[facility] ?PROGRESS NOTE ? ? ?Ethan Peters [id number]:[id number] DOB: Sep 27, 1956 DOA: 01/22/2022  1 ?DOS: the patient was seen and examined on 01/23/2022 ? ?PCP: Dettinger, Elige Radon, MD ? ?Brief History and Hospital Course:  ?66 y.o. Caucasian male with medical history significant for coronary artery disease, diabetes mellitus, gout, hypertension, dyslipidemia, ischemic or myopathy and hypothyroidism, who presented to the ER with acute onset of diffuse abdominal distention with associated abdominal pain, nausea over 4 days.  Recently returned from [place].  Traveled back on March 31.  Noted to have fever.  Mentioned pain in his flank area.  He felt as if "his kidneys were hurting".  Found to be in DKA.  Hospitalized for further management.   ? ?Consultants: None ? ?Procedures: None ? ? ? ?Subjective: ?Patient continues to have some abdominal discomfort especially in his flank area.  Denies any blood in the urine.  Noticed a cough a few days ago.  Denies any chest pain or shortness of breath.  No leg swelling. ? ? ? ?Assessment/Plan: ? ?* DKA (diabetic ketoacidosis) (HCC) ?Likely brought on by his recent acute illness.  Appears to have a urinary tract infection. ?Was placed on IV insulin in the emergency department.  His anion gap closed overnight.  Transition back to his usual insulin.  Monitor labs periodically. ?HbA1c 12.7 which implies poor compliance over the past few months. HbA1c was 6.3 in October. ? ?AKI (acute kidney injury) (HCC) ?Baseline renal function is normal with a creatinine of 1.06 on April 6.  Came in with with creatinine of 1.78.  Noted to have climbed up to 1.86 this morning.  Continue with IV fluids. ?Renal failure likely due to combination of DKA and poor oral intake with hypovolemia.  Avoid nephrotoxic agent.  He is on ARB which has been held. ? ?Acute urinary tract infection ?UA noted to be abnormal.  Patient complaining of flank pain.  Send urine culture.  Started on  ceftriaxone. ? ?Community acquired pneumonia ?Chest x-ray she suggests left lower lobe bronchopneumonia.  Patient does have a cough.  COVID-19 and influenza PCR negative.  Already started on ceftriaxone.  Will add azithromycin. ?Recent travel from [place] noted.  WBC noted to be elevated.  Procalcitonin also elevated at 1.78. ? ?Ileus (HCC) ?Ileus was noted on abdominal film from 4/10.  A CT scan was done subsequently which did not show any evidence for bowel obstruction. ?Clear liquid diet for now.  Follow clinically. ? ?CAD (coronary artery disease), native coronary artery ?Cardiac status is stable.  Hold his ARB for now. ? ?BPH (benign prostatic hyperplasia) ?We will continue Flomax. ? ?Dyslipidemia ?We will continue statin therapy. ? ?Gout ?We will continue allopurinol. ? ?Hypothyroidism, unspecified ?We will continue Synthroid. ? ? ?DVT Prophylaxis: Lovenox ?Code Status: Full code ?Family Communication: Discussed with patient and his spouse ?Disposition Plan: Hopefully return home in improved ? ?Status is: Inpatient ?Remains inpatient appropriate because: DKA, pneumonia, UTI ? ? ? ? ?Medications: Scheduled: ? allopurinol  300 mg Oral Daily  ? brimonidine  1 drop Both Eyes TID  ? cholecalciferol  1,000 Units Oral Daily  ? dorzolamide-timolol  1 drop Both Eyes BID  ? enoxaparin (LOVENOX) injection  40 mg Subcutaneous Q24H  ? insulin aspart  0-15 Units Subcutaneous TID WC  ? insulin aspart protamine- aspart  40 Units Subcutaneous BID WC  ? latanoprost  1 drop Both Eyes QHS  ? levothyroxine  50 mcg Oral QAC breakfast  ? melatonin  3 mg  Oral QHS  ? tamsulosin  0.4 mg Oral Daily  ? ?Continuous: ? sodium chloride    ? azithromycin    ? cefTRIAXone (ROCEPHIN)  IV    ? ?YQM:[id number] **OR** acetaminophen, B-complex with vitamin C, dextrose, magnesium hydroxide, morphine injection, nitroGLYCERIN, ondansetron **OR** ondansetron (ZOFRAN) IV ? ?Antibiotics: ?Anti-infectives (From admission, onward)  ? ? Start     Dose/Rate  Route Frequency Ordered Stop  ? 01/23/22 1000  azithromycin (ZITHROMAX) 500 mg in sodium chloride 0.9 % 250 mL IVPB       ? 500 mg ?250 mL/hr over 60 Minutes Intravenous Every 24 hours 01/23/22 0956 01/28/22 0959  ? 01/23/22 0915  cefTRIAXone (ROCEPHIN) 1 g in sodium chloride 0.9 % 100 mL IVPB       ? 1 g ?200 mL/hr over 30 Minutes Intravenous Every 24 hours 01/23/22 0900    ? ?  ? ? ?Objective: ? ?Vital Signs ? ?Vitals:  ? 01/23/22 0600 01/23/22 0615 01/23/22 0715 01/23/22 0808  ?BP: 105/72 106/77 136/88   ?Pulse: 89 87 84   ?Resp: 20 (!) 23 19   ?Temp:      ?TempSrc:      ?SpO2: 96% 94% 98% 98%  ?Weight:      ?Height:      ? ? ?Intake/Output Summary (Last 24 hours) at 01/23/2022 0956 ?Last data filed at 01/23/2022 0810 ?Gross per 24 hour  ?Intake --  ?Output 300 ml  ?Net -300 ml  ? ?Filed Weights  ? 01/22/22 2119  ?Weight: 72.6 kg  ? ? ?General appearance: Awake alert.  In no distress ?Resp: Normal effort.  Diminished air entry at the bases with few crackles.  No wheezing or rhonchi. ?Cardio: S1-S2 is normal regular.  No S3-S4.  No rubs murmurs or bruit ?GI: Abdomen is soft.  Nontender nondistended.  Bowel sounds are present normal.  No masses organomegaly ?Extremities: No edema.  Full range of motion of lower extremities.  No calf tenderness ?Neurologic: Alert and oriented x3.  No focal neurological deficits.  ? ? ?Lab Results: ? ?Data Reviewed: I have personally reviewed labs and imaging study reports ? ?CBC: ?Recent Labs  ?Lab 01/18/22 ?1116 01/22/22 ?1624 01/22/22 ?2033 01/22/22 ?2235 01/23/22 ?1324  ?WBC 7.0 17.8* 18.5*  --  18.3*  ?NEUTROABS 4.4 15.7* 16.1*  --   --   ?HGB 14.8 14.7 14.2 14.3 12.4*  ?HCT 43.7 42.6 40.0 42.0 35.9*  ?MCV 95 93.8 92.6  --  94.7  ?PLT 271 281 244  --  235  ? ? ?Basic Metabolic Panel: ?Recent Labs  ?Lab 01/18/22 ?1116 01/22/22 ?1624 01/22/22 ?2033 01/22/22 ?2235 01/23/22 ?4010 01/23/22 ?2725  ?NA 134 125* 126* 127* 129* 131*  ?K 4.9 4.1 4.1 4.1 3.9 4.2  ?CL 98 93* 95*  --  99 100   ?CO2 23 13* 15*  --  19* 23  ?GLUCOSE 372* 400* 407*  --  204* 138*  ?BUN 23 18 19   --  19 20  ?CREATININE 1.06 1.78* 1.75*  --  1.65* 1.86*  ?CALCIUM 9.5 8.9 8.7*  --  8.6* 8.6*  ? ? ?GFR: ?Estimated Creatinine Clearance: 35.3 mL/min (A) (by C-G formula based on SCr of 1.86 mg/dL (H)). ? ?Liver Function Tests: ?Recent Labs  ?Lab 01/18/22 ?1116 01/22/22 ?1624  ?AST 22 24  ?ALT 27 24  ?ALKPHOS 111 88  ?BILITOT 0.4 1.7*  ?PROT 7.3 8.1  ?ALBUMIN 4.1 3.4*  ? ? ?Recent Labs  ?Lab 01/22/22 ?1624  ?LIPASE  25  ? ? ?CBG: ?Recent Labs  ?Lab 01/23/22 ?0239 01/23/22 ?0358 01/23/22 ?1517 01/23/22 ?6160 01/23/22 ?7371  ?GLUCAP 161* 170* 162* 184* 103*  ? ? ? ?Recent Results (from the past 240 hour(s))  ?Resp Panel by RT-PCR (Flu A&B, Covid) Nasopharyngeal Swab     Status: None  ? Collection Time: 01/23/22  6:47 AM  ? Specimen: Nasopharyngeal Swab; Nasopharyngeal(NP) swabs in vial transport medium  ?Result Value Ref Range Status  ? SARS Coronavirus 2 by RT PCR NEGATIVE NEGATIVE Final  ?  Comment: (NOTE) ?SARS-CoV-2 target nucleic acids are NOT DETECTED. ? ?The SARS-CoV-2 RNA is generally detectable in upper respiratory ?specimens during the acute phase of infection. The lowest ?concentration of SARS-CoV-2 viral copies this assay can detect is ?138 copies/mL. A negative result does not preclude SARS-Cov-2 ?infection and should not be used as the sole basis for treatment or ?other patient management decisions. A negative result may occur with  ?improper specimen collection/handling, submission of specimen other ?than nasopharyngeal swab, presence of viral mutation(s) within the ?areas targeted by this assay, and inadequate number of viral ?copies(<138 copies/mL). A negative result must be combined with ?clinical observations, patient history, and epidemiological ?information. The expected result is Negative. ? ?Fact Sheet for Patients:  ?BloggerCourse.com ? ?Fact Sheet for Healthcare Providers:   ?SeriousBroker.it ? ?This test is no t yet approved or cleared by the [place] FDA and  ?has been authorized for detection and/or diagnosis of SARS-CoV-2 by ?FDA under an Emergency Use Authorization (E

## 2022-01-23 NOTE — Assessment & Plan Note (Addendum)
Hypokalemia ? ?Baseline renal function is normal with a creatinine of 1.06 on April 6.  Came in with with creatinine of 1.78.  Worsened to 1.86 and noted to be improving subsequently.   ?Renal failure likely due to combination of DKA and poor oral intake with hypovolemia.  Avoid nephrotoxic agent.  He was on ARB which has been held. ?Renal function gradually improving.  Patient reassured. ?

## 2022-01-23 NOTE — Assessment & Plan Note (Addendum)
UA noted to be abnormal.  Patient complaining of flank pain.  Continue ceftriaxone.  Urine culture with multiple species.  ?Urine culture sent from his PCPs office growing Enterococcus hirae.  Sensitivities reviewed.  Cephalosporins apparently not effective against this organism.  He was changed over to Levaquin.  WBC is normal.  Blood cultures negative.  Will be discharged on levofloxacin. ?

## 2022-01-24 ENCOUNTER — Ambulatory Visit: Payer: PPO | Admitting: Family Medicine

## 2022-01-24 DIAGNOSIS — J189 Pneumonia, unspecified organism: Secondary | ICD-10-CM | POA: Diagnosis not present

## 2022-01-24 DIAGNOSIS — Z794 Long term (current) use of insulin: Secondary | ICD-10-CM | POA: Diagnosis not present

## 2022-01-24 DIAGNOSIS — E1165 Type 2 diabetes mellitus with hyperglycemia: Secondary | ICD-10-CM | POA: Diagnosis not present

## 2022-01-24 DIAGNOSIS — N390 Urinary tract infection, site not specified: Secondary | ICD-10-CM | POA: Diagnosis not present

## 2022-01-24 LAB — GLUCOSE, CAPILLARY
Glucose-Capillary: 131 mg/dL — ABNORMAL HIGH (ref 70–99)
Glucose-Capillary: 124 mg/dL — ABNORMAL HIGH (ref 70–99)
Glucose-Capillary: 100 mg/dL — ABNORMAL HIGH (ref 70–99)
Glucose-Capillary: 200 mg/dL — ABNORMAL HIGH (ref 70–99)
Glucose-Capillary: 79 mg/dL (ref 70–99)
Glucose-Capillary: 57 mg/dL — ABNORMAL LOW (ref 70–99)
Glucose-Capillary: 89 mg/dL (ref 70–99)
Glucose-Capillary: 211 mg/dL — ABNORMAL HIGH (ref 70–99)
Glucose-Capillary: 176 mg/dL — ABNORMAL HIGH (ref 70–99)

## 2022-01-24 LAB — CBC
HCT: 31.1 % — ABNORMAL LOW (ref 39.0–52.0)
Hemoglobin: 11.3 g/dL — ABNORMAL LOW (ref 13.0–17.0)
MCH: 32.9 pg (ref 26.0–34.0)
MCHC: 36.3 g/dL — ABNORMAL HIGH (ref 30.0–36.0)
MCV: 90.7 fL (ref 80.0–100.0)
Platelets: 236 10*3/uL (ref 150–400)
RBC: 3.43 MIL/uL — ABNORMAL LOW (ref 4.22–5.81)
RDW: 11.9 % (ref 11.5–15.5)
WBC: 15.6 10*3/uL — ABNORMAL HIGH (ref 4.0–10.5)
nRBC: 0 % (ref 0.0–0.2)

## 2022-01-24 LAB — BASIC METABOLIC PANEL
Anion gap: 7 (ref 5–15)
BUN: 14 mg/dL (ref 8–23)
CO2: 21 mmol/L — ABNORMAL LOW (ref 22–32)
Calcium: 7.9 mg/dL — ABNORMAL LOW (ref 8.9–10.3)
Chloride: 103 mmol/L (ref 98–111)
Creatinine, Ser: 1.53 mg/dL — ABNORMAL HIGH (ref 0.61–1.24)
GFR, Estimated: 50 mL/min — ABNORMAL LOW (ref >60)
Glucose, Bld: 131 mg/dL — ABNORMAL HIGH (ref 70–99)
Potassium: 3.4 mmol/L — ABNORMAL LOW (ref 3.5–5.1)
Sodium: 131 mmol/L — ABNORMAL LOW (ref 135–145)

## 2022-01-24 LAB — URINE CULTURE

## 2022-01-24 LAB — HEMOGLOBIN A1C
Hgb A1c MFr Bld: 12.3 % — ABNORMAL HIGH (ref 4.8–5.6)
Mean Plasma Glucose: 306.31 mg/dL

## 2022-01-24 MED ORDER — INSULIN ASPART 100 UNIT/ML IJ SOLN: 0.0000 [IU] | Freq: Every day | INTRAMUSCULAR | Status: DC

## 2022-01-24 MED ORDER — INSULIN ASPART 100 UNIT/ML IJ SOLN
0.0000 [IU] | Freq: Three times a day (TID) | INTRAMUSCULAR | Status: DC
  Administered 2022-01-24 – 2022-01-25 (×2): 3 [IU] via SUBCUTANEOUS
  Administered 2022-01-25: 11 [IU] via SUBCUTANEOUS

## 2022-01-24 MED ORDER — METOCLOPRAMIDE HCL 5 MG/ML IJ SOLN
5.0000 mg | Freq: Three times a day (TID) | INTRAMUSCULAR | Status: DC
  Administered 2022-01-24 – 2022-01-26 (×5): 5 mg via INTRAVENOUS
  Filled 2022-01-24 (×5): qty 2

## 2022-01-24 MED ORDER — INSULIN ASPART PROT & ASPART (70-30 MIX) 100 UNIT/ML ~~LOC~~ SUSP
15.0000 [IU] | Freq: Two times a day (BID) | SUBCUTANEOUS | Status: DC
  Filled 2022-01-24: qty 10

## 2022-01-24 MED ORDER — SODIUM CHLORIDE 0.9 % IV SOLN: INTRAVENOUS | Status: AC

## 2022-01-24 MED ORDER — POTASSIUM CHLORIDE CRYS ER 20 MEQ PO TBCR
40.0000 meq | EXTENDED_RELEASE_TABLET | Freq: Once | ORAL | Status: AC
  Administered 2022-01-24: 40 meq via ORAL
  Filled 2022-01-24: qty 2

## 2022-01-24 MED ORDER — ATORVASTATIN CALCIUM 80 MG PO TABS
80.0000 mg | ORAL_TABLET | Freq: Every evening | ORAL | Status: DC
  Administered 2022-01-24 – 2022-01-26 (×3): 80 mg via ORAL
  Filled 2022-01-24 (×3): qty 1

## 2022-01-24 MED ORDER — ASPIRIN EC 81 MG PO TBEC
81.0000 mg | DELAYED_RELEASE_TABLET | Freq: Every day | ORAL | Status: DC
  Administered 2022-01-24 – 2022-01-27 (×4): 81 mg via ORAL
  Filled 2022-01-24 (×4): qty 1

## 2022-01-24 NOTE — Plan of Care (Signed)
Patient will remain nausea free over next 48 hours ?

## 2022-01-24 NOTE — Progress Notes (Signed)
?  Transition of Care (TOC) Screening Note ? ? ?Patient Details  ?Name: Ethan Peters ?Date of Birth: 28-Jun-1956 ? ? ?Transition of Care (TOC) CM/SW Contact:    ?Harriet Masson, RN ?Phone Number: ?01/24/2022, 8:26 AM ? ? ? ?Transition of Care Department [facility]) has reviewed patient and no TOC needs have been identified at this time. We will continue to monitor patient advancement through interdisciplinary progression rounds. If new patient transition needs arise, please place a TOC consult. ? ? ?

## 2022-01-24 NOTE — Progress Notes (Signed)
Pt arrives to the unit via stretcher. Pt is alert and oriented X4. Oral membranes are pink and moist. Skin warm and dry. Pt is blind in both eyes. Primary language is Spanish, but does speak some English. language line used during shift. Respirations are even and nonlabored. Speech is clear. Denies pain. Moves all extremities without difficulty. NS infusing at 142ml/hr to right PIV. Pt tolerates infusion well. Bed in lowest position. Call light within reach.  ? ?

## 2022-01-24 NOTE — Progress Notes (Signed)
? ?[facility] ?PROGRESS NOTE ? ? ?Ethan Peters [id number]:[id number] DOB: 1955-12-03 DOA: 01/22/2022  2 ?DOS: the patient was seen and examined on 01/24/2022 ? ?PCP: Dettinger, Elige Radon, MD ? ?Brief History and Hospital Course:  ?66 y.o. Caucasian male with medical history significant for coronary artery disease, diabetes mellitus, gout, hypertension, dyslipidemia, ischemic or myopathy and hypothyroidism, who presented to the ER with acute onset of diffuse abdominal distention with associated abdominal pain, nausea over 4 days.  Recently returned from [place].  Traveled back on March 31.  Noted to have fever.  Mentioned pain in his flank area.  He felt as if "his kidneys were hurting".  Found to be in DKA.  Hospitalized for further management.   ? ?Consultants: None ? ?Procedures: None ? ? ? ?Subjective: ?Patient mentions that he continues to have flank pain but has not had any vomiting.  Okay with advancing diet.  No other complaints offered.  Please note patient has visual impairment. ? ? ?Assessment/Plan: ? ?* Uncontrolled type 2 diabetes mellitus with hyperglycemia, with long-term current use of insulin (HCC) ?DKA, Resolved ? ?DKA likely brought on by acute illness.  Appears to have a urinary tract infection and pneumonia. ?Was placed on IV insulin in the emergency department.  His anion gap closed and he was transitioned back to his usual insulin.   ?HbA1c 12.7 which implies poor compliance over the past few months. HbA1c was 6.3 in October. ?Advance diet.  Continue to monitor CBGs.  Dose had to be adjusted yesterday due to hypoglycemia. ? ?AKI (acute kidney injury) (HCC) ?Hypokalemia ? ?Baseline renal function is normal with a creatinine of 1.06 on April 6.  Came in with with creatinine of 1.78.  Worsened to 1.86 and noted to be improving subsequently.  Continue IV fluids for another 24 hours. ?Renal failure likely due to combination of DKA and poor oral intake with hypovolemia.  Avoid nephrotoxic agent.  He  is on ARB which has been held. ?Replace potassium. ? ?Acute urinary tract infection ?UA noted to be abnormal.  Patient complaining of flank pain.  Continue ceftriaxone.  Follow-up on urine culture. ? ?Community acquired pneumonia ?Chest x-ray she suggests left lower lobe bronchopneumonia.  Patient does have a cough.  COVID-19 and influenza PCR negative.   ?Continue ceftriaxone and azithromycin.   ?Recent travel from [place] noted.  WBC noted to be elevated.  Procalcitonin also elevated at 1.78. ? ?Ileus (HCC) ?Ileus was noted on abdominal film from 4/10.  A CT scan was done subsequently which did not show any evidence for bowel obstruction. ?No further episodes of vomiting.  Abdomen is benign.  Advance diet. ? ?CAD (coronary artery disease), native coronary artery ?Cardiac status is stable.  Hold his ARB for now. ? ?BPH (benign prostatic hyperplasia) ?We will continue Flomax. ? ?Dyslipidemia ?We will continue statin therapy. ? ?Gout ?We will continue allopurinol. ? ?Hypothyroidism, unspecified ?We will continue Synthroid. ? ? ?DVT Prophylaxis: Lovenox ?Code Status: Full code ?Family Communication: Discussed with patient and his spouse ?Disposition Plan: Hopefully return home in improved ? ?Status is: Inpatient ?Remains inpatient appropriate because: DKA, pneumonia, UTI ? ? ? ? ?Medications: Scheduled: ? allopurinol  300 mg Oral Daily  ? brimonidine  1 drop Both Eyes TID  ? cholecalciferol  1,000 Units Oral Daily  ? dorzolamide-timolol  1 drop Both Eyes BID  ? enoxaparin (LOVENOX) injection  40 mg Subcutaneous Q24H  ? insulin aspart  0-15 Units Subcutaneous TID WC  ? insulin aspart  0-5 Units Subcutaneous QHS  ? insulin aspart protamine- aspart  25 Units Subcutaneous BID WC  ? latanoprost  1 drop Both Eyes QHS  ? levothyroxine  50 mcg Oral QAC breakfast  ? melatonin  3 mg Oral QHS  ? potassium chloride  40 mEq Oral Once  ? tamsulosin  0.4 mg Oral Daily  ? ?Continuous: ? sodium chloride    ? azithromycin Stopped  (01/23/22 1150)  ? cefTRIAXone (ROCEPHIN)  IV Stopped (01/23/22 1042)  ? ?[id number]:[id number] **OR** acetaminophen, B-complex with vitamin C, dextrose, magnesium hydroxide, morphine injection, nitroGLYCERIN, ondansetron **OR** ondansetron (ZOFRAN) IV ? ?Antibiotics: ?Anti-infectives (From admission, onward)  ? ? Start     Dose/Rate Route Frequency Ordered Stop  ? 01/23/22 1000  azithromycin (ZITHROMAX) 500 mg in sodium chloride 0.9 % 250 mL IVPB       ? 500 mg ?250 mL/hr over 60 Minutes Intravenous Every 24 hours 01/23/22 0956 01/28/22 0959  ? 01/23/22 0915  cefTRIAXone (ROCEPHIN) 1 g in sodium chloride 0.9 % 100 mL IVPB       ? 1 g ?200 mL/hr over 30 Minutes Intravenous Every 24 hours 01/23/22 0900    ? ?  ? ? ?Objective: ? ?Vital Signs ? ?Vitals:  ? 01/23/22 2059 01/23/22 2325 01/24/22 0340 01/24/22 5859  ?BP: (!) 163/93 121/84 111/70 134/90  ?Pulse: 99 97 82   ?Resp: 18 18 18    ?Temp: 98.3 ?F (36.8 ?C) 98.6 ?F (37 ?C) 98.6 ?F (37 ?C)   ?TempSrc: Axillary Oral Oral   ?SpO2: 95% 98% 100%   ?Weight: 77.2 kg     ?Height: 5\' 6"  (1.676 m)     ? ? ?Intake/Output Summary (Last 24 hours) at 01/24/2022 0911 ?Last data filed at 01/24/2022 0600 ?Gross per 24 hour  ?Intake 1869.93 ml  ?Output 1750 ml  ?Net 119.93 ml  ? ? ?Filed Weights  ? 01/22/22 2119 01/23/22 2059  ?Weight: 72.6 kg 77.2 kg  ? ? ?General appearance: Awake alert.  In no distress ?Resp: Diminished air entry at the bases.  Normal effort.  Few crackles.  No wheezing or rhonchi. ?Cardio: S1-S2 is normal regular.  No S3-S4.  No rubs murmurs or bruit ?GI: Abdomen is soft.  Nontender nondistended.  Bowel sounds are present normal.  No masses organomegaly ?Extremities: No edema.  Moving all of his extremities ?Neurologic:  No focal neurological deficits.  ? ? ? ?Lab Results: ? ?Data Reviewed: I have personally reviewed labs and imaging study reports ? ?CBC: ?Recent Labs  ?Lab 01/18/22 ?1116 01/22/22 ?1624 01/22/22 ?2033 01/22/22 ?2235 01/23/22 ?2924 01/24/22 ?0130  ?WBC  7.0 17.8* 18.5*  --  18.3* 15.6*  ?NEUTROABS 4.4 15.7* 16.1*  --   --   --   ?HGB 14.8 14.7 14.2 14.3 12.4* 11.3*  ?HCT 43.7 42.6 40.0 42.0 35.9* 31.1*  ?MCV 95 93.8 92.6  --  94.7 90.7  ?PLT 271 281 244  --  235 236  ? ? ? ?Basic Metabolic Panel: ?Recent Labs  ?Lab 01/22/22 ?2033 01/22/22 ?2235 01/23/22 ?4628 01/23/22 ?6381 01/23/22 ?1618 01/24/22 ?0130  ?NA 126* 127* 129* 131* 131* 131*  ?K 4.1 4.1 3.9 4.2 3.9 3.4*  ?CL 95*  --  99 100 100 103  ?CO2 15*  --  19* 23 22 21*  ?GLUCOSE 407*  --  204* 138* 124* 131*  ?BUN 19  --  19 20 15 14   ?CREATININE 1.75*  --  1.65* 1.86* 1.66* 1.53*  ?CALCIUM 8.7*  --  8.6* 8.6* 8.5* 7.9*  ? ? ? ?GFR: ?Estimated Creatinine Clearance: 46.5 mL/min (A) (by C-G formula based on SCr of 1.53 mg/dL (H)). ? ?Liver Function Tests: ?Recent Labs  ?Lab 01/18/22 ?1116 01/22/22 ?1624  ?AST 22 24  ?ALT 27 24  ?ALKPHOS 111 88  ?BILITOT 0.4 1.7*  ?PROT 7.3 8.1  ?ALBUMIN 4.1 3.4*  ? ? ? ?Recent Labs  ?Lab 01/22/22 ?1624  ?LIPASE 25  ? ? ? ?CBG: ?Recent Labs  ?Lab 01/23/22 ?1731 01/23/22 ?2243 01/23/22 ?2308 01/24/22 ?0156 01/24/22 ?0354  ?GLUCAP 196* 58* 80 131* 124*  ? ? ? ? ?Recent Results (from the past 240 hour(s))  ?Urine Culture     Status: None (Preliminary result)  ? Collection Time: 01/18/22 11:51 AM  ? Specimen: Urine  ? UR  ?Result Value Ref Range Status  ? Urine Culture, Routine Preliminary report  Preliminary  ? Organism ID, Bacteria Comment  Preliminary  ?  Comment: Microbiological testing to rule out the presence of possible pathogens ?is in progress. ?50,000-100,000 colony forming units per mL ?  ?Culture, blood (routine x 2)     Status: None (Preliminary result)  ? Collection Time: 01/23/22  3:00 AM  ? Specimen: BLOOD  ?Result Value Ref Range Status  ? Specimen Description BLOOD SITE NOT SPECIFIED  Final  ? Special Requests   Final  ?  BOTTLES DRAWN AEROBIC AND ANAEROBIC Blood Culture results may not be optimal due to an inadequate volume of blood received in culture bottles  ?  Culture   Final  ?  NO GROWTH 1 DAY ?Performed at [facility], [address], [place], [place] [address] ?  ? Report Status PENDING  Incomplete  ?Culture, blood (routine x 2)     Status: None (Preliminar

## 2022-01-25 DIAGNOSIS — Z794 Long term (current) use of insulin: Secondary | ICD-10-CM | POA: Diagnosis not present

## 2022-01-25 DIAGNOSIS — E1165 Type 2 diabetes mellitus with hyperglycemia: Secondary | ICD-10-CM | POA: Diagnosis not present

## 2022-01-25 DIAGNOSIS — J189 Pneumonia, unspecified organism: Secondary | ICD-10-CM | POA: Diagnosis not present

## 2022-01-25 DIAGNOSIS — N390 Urinary tract infection, site not specified: Secondary | ICD-10-CM | POA: Diagnosis not present

## 2022-01-25 LAB — CBC
HCT: 31.2 % — ABNORMAL LOW (ref 39.0–52.0)
Hemoglobin: 10.5 g/dL — ABNORMAL LOW (ref 13.0–17.0)
MCH: 31.9 pg (ref 26.0–34.0)
MCHC: 33.7 g/dL (ref 30.0–36.0)
MCV: 94.8 fL (ref 80.0–100.0)
Platelets: 260 10*3/uL (ref 150–400)
RBC: 3.29 MIL/uL — ABNORMAL LOW (ref 4.22–5.81)
RDW: 12.3 % (ref 11.5–15.5)
WBC: 9.6 10*3/uL (ref 4.0–10.5)
nRBC: 0 % (ref 0.0–0.2)

## 2022-01-25 LAB — GLUCOSE, CAPILLARY
Glucose-Capillary: 176 mg/dL — ABNORMAL HIGH (ref 70–99)
Glucose-Capillary: 196 mg/dL — ABNORMAL HIGH (ref 70–99)
Glucose-Capillary: 396 mg/dL — ABNORMAL HIGH (ref 70–99)
Glucose-Capillary: 307 mg/dL — ABNORMAL HIGH (ref 70–99)
Glucose-Capillary: 329 mg/dL — ABNORMAL HIGH (ref 70–99)
Glucose-Capillary: 105 mg/dL — ABNORMAL HIGH (ref 70–99)
Glucose-Capillary: 52 mg/dL — ABNORMAL LOW (ref 70–99)

## 2022-01-25 LAB — BASIC METABOLIC PANEL
Anion gap: 7 (ref 5–15)
BUN: 10 mg/dL (ref 8–23)
CO2: 22 mmol/L (ref 22–32)
Calcium: 7.8 mg/dL — ABNORMAL LOW (ref 8.9–10.3)
Chloride: 103 mmol/L (ref 98–111)
Creatinine, Ser: 1.37 mg/dL — ABNORMAL HIGH (ref 0.61–1.24)
GFR, Estimated: 57 mL/min — ABNORMAL LOW (ref >60)
Glucose, Bld: 188 mg/dL — ABNORMAL HIGH (ref 70–99)
Potassium: 3.4 mmol/L — ABNORMAL LOW (ref 3.5–5.1)
Sodium: 132 mmol/L — ABNORMAL LOW (ref 135–145)

## 2022-01-25 LAB — URINE CULTURE

## 2022-01-25 LAB — MAGNESIUM: Magnesium: 1.7 mg/dL (ref 1.7–2.4)

## 2022-01-25 MED ORDER — INSULIN ASPART 100 UNIT/ML IJ SOLN
0.0000 [IU] | Freq: Three times a day (TID) | INTRAMUSCULAR | Status: DC
  Administered 2022-01-25: 15 [IU] via SUBCUTANEOUS
  Administered 2022-01-26: 7 [IU] via SUBCUTANEOUS
  Administered 2022-01-26 – 2022-01-27 (×3): 4 [IU] via SUBCUTANEOUS
  Administered 2022-01-27: 20 [IU] via SUBCUTANEOUS

## 2022-01-25 MED ORDER — INSULIN ASPART 100 UNIT/ML IJ SOLN: 0.0000 [IU] | Freq: Every day | INTRAMUSCULAR | Status: DC

## 2022-01-25 MED ORDER — POTASSIUM CHLORIDE CRYS ER 20 MEQ PO TBCR
40.0000 meq | EXTENDED_RELEASE_TABLET | Freq: Once | ORAL | Status: AC
  Administered 2022-01-25: 40 meq via ORAL
  Filled 2022-01-25: qty 2

## 2022-01-25 MED ORDER — LEVOFLOXACIN 500 MG PO TABS
500.0000 mg | ORAL_TABLET | Freq: Every day | ORAL | Status: DC
  Administered 2022-01-25 – 2022-01-27 (×3): 500 mg via ORAL
  Filled 2022-01-25 (×3): qty 1

## 2022-01-25 MED ORDER — SODIUM CHLORIDE 0.9 % IV SOLN: INTRAVENOUS | Status: AC

## 2022-01-25 MED ORDER — INSULIN ASPART PROT & ASPART (70-30 MIX) 100 UNIT/ML ~~LOC~~ SUSP
8.0000 [IU] | Freq: Two times a day (BID) | SUBCUTANEOUS | Status: DC
  Administered 2022-01-25: 8 [IU] via SUBCUTANEOUS
  Filled 2022-01-25: qty 10

## 2022-01-25 MED ORDER — INSULIN ASPART PROT & ASPART (70-30 MIX) 100 UNIT/ML ~~LOC~~ SUSP
15.0000 [IU] | Freq: Two times a day (BID) | SUBCUTANEOUS | Status: DC
  Administered 2022-01-25 – 2022-01-27 (×4): 15 [IU] via SUBCUTANEOUS
  Filled 2022-01-25: qty 10

## 2022-01-25 NOTE — Care Management Important Message (Signed)
Important Message ? ?Patient Details  ?Name: Ethan Peters ?MRN: [id number] ?Date of Birth: 06-15-1956 ? ? ?Medicare Important Message Given:  Yes ? ? ? ? ?[name] ?01/25/2022, 3:20 PM ?

## 2022-01-25 NOTE — TOC Initial Note (Signed)
Transition of Care (TOC) - Initial/Assessment Note  ? ? ?Patient Details  ?Name: Ethan Peters ?MRN: [id number] ?Date of Birth: 1956-08-08 ? ?Transition of Care [facility]) CM/SW Contact:    ?Ninfa Meeker, RN ?Phone Number: ?01/25/2022, 10:23 AM ? ?Clinical Narrative:     ? ?Transition of Care Screening Note:    ? ?Transition of Care Department [facility]) has reviewed patient and no TOC needs have been identified at this time. We will continue to monitor patient advancement through Interdisciplinary progressions. If new patient transition needs arise, please place a consult.             ? ? ?  ?  ? ? ?Patient Goals and CMS Choice ?  ?  ?  ? ?Expected Discharge Plan and Services ?  ?  ?  ?  ?  ?                ?  ?  ?  ?  ?  ?  ?  ?  ?  ?  ? ?Prior Living Arrangements/Services ?  ?  ?  ?       ?  ?  ?  ?  ? ?Activities of Daily Living ?Home Assistive Devices/Equipment: Grab bars in shower ?ADL Screening (condition at time of admission) ?Patient's cognitive ability adequate to safely complete daily activities?: Yes ?Is the patient deaf or have difficulty hearing?: No ?Does the patient have difficulty seeing, even when wearing glasses/contacts?: Yes ?Does the patient have difficulty concentrating, remembering, or making decisions?: No ?Patient able to express need for assistance with ADLs?: Yes ?Does the patient have difficulty dressing or bathing?: No ?Independently performs ADLs?: No ?Communication: Independent ?Dressing (OT): Needs assistance ?Is this a change from baseline?: Pre-admission baseline ?Grooming: Needs assistance ?Is this a change from baseline?: Pre-admission baseline ?Feeding: Needs assistance ?Is this a change from baseline?: Pre-admission baseline ?Bathing: Needs assistance ?Is this a change from baseline?: Pre-admission baseline ?Toileting: Needs assistance ?Is this a change from baseline?: Pre-admission baseline ?In/Out Bed: Needs assistance ?Is this a change from baseline?: Pre-admission  baseline ?Walks in Home: Needs assistance ?Is this a change from baseline?: Pre-admission baseline ?Does the patient have difficulty walking or climbing stairs?: No ?Weakness of Legs: None ?Weakness of Arms/Hands: None ? ?Permission Sought/Granted ?  ?  ?   ?   ?   ?   ? ?Emotional Assessment ?  ?  ?  ?  ?  ?  ? ?Admission diagnosis:  Generalized abdominal pain [R10.84] ?DKA (diabetic ketoacidosis) ([place]) [E11.10] ?Diabetic ketoacidosis without coma associated with type 2 diabetes mellitus ([place]) [E11.10] ?Patient Active Problem List  ? Diagnosis Date Noted  ? AKI (acute kidney injury) ([place]) 01/23/2022  ? Dyslipidemia 01/23/2022  ? Hypothyroidism, unspecified 01/23/2022  ? BPH (benign prostatic hyperplasia) 01/23/2022  ? Gout 01/23/2022  ? Ileus ([place]) 01/23/2022  ? Community acquired pneumonia 01/23/2022  ? Acute urinary tract infection 01/23/2022  ? Uncontrolled type 2 diabetes mellitus with hyperglycemia, with long-term current use of insulin ([place]) 01/22/2022  ? Erectile dysfunction 01/15/2022  ? Hypothyroidism 07/16/2017  ? Diabetic retinopathy of both eyes associated with type 2 diabetes mellitus ([place]) 07/16/2017  ? Mixed hyperlipidemia 06/20/2016  ? Long term current use of insulin ([place]) 02/22/2015  ? Diabetic neuropathy ([place]) 02/22/2015  ? Rotator cuff arthropathy 02/22/2015  ? CAD (coronary artery disease), native coronary artery 01/14/2013  ? Cardiomyopathy, ischemic 01/14/2013  ? DM type 2 causing vascular disease ([place]) 01/09/2013  ? Essential  hypertension 01/09/2013  ? ?PCP:  Dettinger, Fransisca Kaufmann, MD ?Pharmacy:   ?[id number] - [place], [place] [address] ?[place] ?[place] [address] ?Phone: [phone] Fax: [phone] ? ? ? ? ?Social Determinants of Health (SDOH) Interventions ?  ? ?Readmission Risk Interventions ?   ? View : No data to display.  ?  ?  ?  ? ? ? ?

## 2022-01-25 NOTE — Progress Notes (Signed)
? ?[facility] ?PROGRESS NOTE ? ? ?Ethan Peters GXQ:[id number] DOB: January 19, 1956 DOA: 01/22/2022  3 ?DOS: the patient was seen and examined on 01/25/2022 ? ?PCP: Dettinger, Elige Radon, MD ? ?Brief History and Hospital Course:  ?66 y.o. Caucasian male with medical history significant for coronary artery disease, diabetes mellitus, gout, hypertension, dyslipidemia, ischemic or myopathy and hypothyroidism, who presented to the ER with acute onset of diffuse abdominal distention with associated abdominal pain, nausea over 4 days.  Recently returned from [place].  Traveled back on March 31.  Noted to have fever.  Mentioned pain in his flank area.  He felt as if "his kidneys were hurting".  Found to be in DKA.  Hospitalized for further management.   ? ?Consultants: None ? ?Procedures: None ? ? ? ?Subjective: ?Son was able to interpret.  Patient mentions that he is feeling better though continues to have discomfort in both flank areas as well as in the lower abdomen.  Denies any nausea this morning.  He was told to eat and drink better than what he did yesterday.  He was asked to sit out of bed to chair.   ? ? ?Assessment/Plan: ? ?* Uncontrolled type 2 diabetes mellitus with hyperglycemia, with long-term current use of insulin (HCC) ?DKA, Resolved ? ?DKA likely brought on by acute illness.  Appears to have a urinary tract infection and pneumonia. ?Was placed on IV insulin in the emergency department.  His anion gap closed and he was transitioned back to his usual insulin.   ?HbA1c 12.7 which implies poor compliance over the past few months. HbA1c was 6.3 in October. ?Diet was advanced.  Oral intake has been poor.  He was noted to have hypoglycemic episodes yesterday.  Dose of his insulin had to be significantly decreased.  Patient was encouraged to eat and drink.   ?There was some concern for diabetic gastroparesis so he was placed on IV Reglan last night.  Denies any nausea this morning. ? ?AKI (acute kidney injury)  (HCC) ?Hypokalemia ? ?Baseline renal function is normal with a creatinine of 1.06 on April 6.  Came in with with creatinine of 1.78.  Worsened to 1.86 and noted to be improving subsequently.   ?Renal failure likely due to combination of DKA and poor oral intake with hypovolemia.  Avoid nephrotoxic agent.  He is on ARB which has been held. ?Renal function gradually improving.  Monitor urine output. ?Supplement potassium.  Magnesium is 1.7. ? ?Acute urinary tract infection ?UA noted to be abnormal.  Patient complaining of flank pain.  Continue ceftriaxone.  Urine culture with multiple species.  Continue ceftriaxone for now.  Blood cultures negative. ? ?Community acquired pneumonia ?Chest x-ray she suggests left lower lobe bronchopneumonia.  Patient did have a cough.  COVID-19 and influenza PCR negative.   ?Continue ceftriaxone and azithromycin.   ?Recent travel from [place] noted.  WBC noted to be elevated.  Procalcitonin also elevated at 1.78. ?Noted to be febrile last night.  WBC is noted to be normal today.  We will recheck procalcitonin tomorrow ? ?Ileus (HCC) ?Ileus was noted on abdominal film from 4/10.  A CT scan was done subsequently which did not show any evidence for bowel obstruction. ?No further episodes of vomiting.  Abdomen remains benign.  Patient encouraged to eat and drink. start mobilizing. ? ?CAD (coronary artery disease), native coronary artery ?Cardiac status is stable.  Holding his ARB for now. ? ?BPH (benign prostatic hyperplasia) ?Continue Flomax. ? ?Dyslipidemia ?Continue statin ? ?Gout ?  Continue allopurinol ? ?Hypothyroidism, unspecified ?Continue levothyroxine ? ? ?DVT Prophylaxis: Lovenox ?Code Status: Full code ?Family Communication: Discussed with patient and his son ?Disposition Plan: Hopefully return home when improved.  Start mobilizing. ? ?Status is: Inpatient ?Remains inpatient appropriate because: pneumonia, UTI ? ? ? ? ?Medications: Scheduled: ? allopurinol  300 mg Oral Daily  ?  aspirin EC  81 mg Oral Daily  ? atorvastatin  80 mg Oral QPM  ? brimonidine  1 drop Both Eyes TID  ? cholecalciferol  1,000 Units Oral Daily  ? dorzolamide-timolol  1 drop Both Eyes BID  ? enoxaparin (LOVENOX) injection  40 mg Subcutaneous Q24H  ? insulin aspart  0-15 Units Subcutaneous TID WC  ? insulin aspart protamine- aspart  8 Units Subcutaneous BID WC  ? latanoprost  1 drop Both Eyes QHS  ? levothyroxine  50 mcg Oral QAC breakfast  ? metoCLOPramide (REGLAN) injection  5 mg Intravenous Q8H  ? tamsulosin  0.4 mg Oral Daily  ? ?Continuous: ? azithromycin 500 mg (01/24/22 1417)  ? cefTRIAXone (ROCEPHIN)  IV 1 g (01/25/22 9371)  ? ?IRC:[id number] **OR** acetaminophen, dextrose, magnesium hydroxide, morphine injection, nitroGLYCERIN, ondansetron **OR** ondansetron (ZOFRAN) IV ? ?Antibiotics: ?Anti-infectives (From admission, onward)  ? ? Start     Dose/Rate Route Frequency Ordered Stop  ? 01/23/22 1000  azithromycin (ZITHROMAX) 500 mg in sodium chloride 0.9 % 250 mL IVPB       ? 500 mg ?250 mL/hr over 60 Minutes Intravenous Every 24 hours 01/23/22 0956 01/28/22 0959  ? 01/23/22 0915  cefTRIAXone (ROCEPHIN) 1 g in sodium chloride 0.9 % 100 mL IVPB       ? 1 g ?200 mL/hr over 30 Minutes Intravenous Every 24 hours 01/23/22 0900    ? ?  ? ? ?Objective: ? ?Vital Signs ? ?Vitals:  ? 01/24/22 1940 01/24/22 2315 01/25/22 0311 01/25/22 0800  ?BP: 138/88 120/72 124/84 (!) 143/85  ?Pulse: 90 88 72 84  ?Resp: 20 20 18 17   ?Temp: 99.3 ?F (37.4 ?C) (!) 101.2 ?F (38.4 ?C) 98.6 ?F (37 ?C) 98.7 ?F (37.1 ?C)  ?TempSrc: Oral Oral Oral Oral  ?SpO2: 97% 100% 100% 95%  ?Weight:      ?Height:      ? ? ?Intake/Output Summary (Last 24 hours) at 01/25/2022 1049 ?Last data filed at 01/25/2022 0500 ?Gross per 24 hour  ?Intake 906.32 ml  ?Output 1125 ml  ?Net -218.68 ml  ? ? ?Filed Weights  ? 01/22/22 2119 01/23/22 2059  ?Weight: 72.6 kg 77.2 kg  ? ? ?General appearance: Awake alert.  In no distress ?Resp: Improved air entry bilaterally.  Few  crackles at the bases.  No wheezing or rhonchi. ?Cardio: S1-S2 is normal regular.  No S3-S4.  No rubs murmurs or bruit ?GI: Abdomen is soft.  Nontender nondistended.  Bowel sounds are present normal.  No masses organomegaly ?Extremities: No edema.  Moving all 4 extremities ?Neurologic:  No focal neurological deficits.  ? ? ? ? ?Lab Results: ? ?Data Reviewed: I have personally reviewed labs and imaging study reports ? ?CBC: ?Recent Labs  ?Lab 01/18/22 ?1116 01/22/22 ?1624 01/22/22 ?1624 01/22/22 ?2033 01/22/22 ?2235 01/23/22 ?5852 01/24/22 ?0130 01/25/22 ?0114  ?WBC 7.0 17.8*  --  18.5*  --  18.3* 15.6* 9.6  ?NEUTROABS 4.4 15.7*  --  16.1*  --   --   --   --   ?HGB 14.8 14.7   < > 14.2 14.3 12.4* 11.3* 10.5*  ?HCT 43.7 42.6   < >  40.0 42.0 35.9* 31.1* 31.2*  ?MCV 95 93.8  --  92.6  --  94.7 90.7 94.8  ?PLT 271 281  --  244  --  235 236 260  ? < > = values in this interval not displayed.  ? ? ? ?Basic Metabolic Panel: ?Recent Labs  ?Lab 01/23/22 ?7654 01/23/22 ?6503 01/23/22 ?1618 01/24/22 ?0130 01/25/22 ?0114  ?NA 129* 131* 131* 131* 132*  ?K 3.9 4.2 3.9 3.4* 3.4*  ?CL 99 100 100 103 103  ?CO2 19* 23 22 21* 22  ?GLUCOSE 204* 138* 124* 131* 188*  ?BUN 19 20 15 14 10   ?CREATININE 1.65* 1.86* 1.66* 1.53* 1.37*  ?CALCIUM 8.6* 8.6* 8.5* 7.9* 7.8*  ?MG  --   --   --   --  1.7  ? ? ? ?GFR: ?Estimated Creatinine Clearance: 51.9 mL/min (A) (by C-G formula based on SCr of 1.37 mg/dL (H)). ? ?Liver Function Tests: ?Recent Labs  ?Lab 01/18/22 ?1116 01/22/22 ?1624  ?AST 22 24  ?ALT 27 24  ?ALKPHOS 111 88  ?BILITOT 0.4 1.7*  ?PROT 7.3 8.1  ?ALBUMIN 4.1 3.4*  ? ? ? ?Recent Labs  ?Lab 01/22/22 ?1624  ?LIPASE 25  ? ? ? ?CBG: ?Recent Labs  ?Lab 01/24/22 ?1825 01/24/22 ?1943 01/24/22 ?2318 01/25/22 ?01/27/22 01/25/22 ?0803  ?GLUCAP 89 211* 176* 176* 196*  ? ? ? ? ?Recent Results (from the past 240 hour(s))  ?Urine Culture     Status: None (Preliminary result)  ? Collection Time: 01/18/22 11:51 AM  ? Specimen: Urine  ? UR  ?Result Value Ref  Range Status  ? Urine Culture, Routine Preliminary report  Preliminary  ? Organism ID, Bacteria Comment  Preliminary  ?  Comment: Microbiological testing to rule out the presence of possible pathogens ?is in

## 2022-01-26 DIAGNOSIS — J189 Pneumonia, unspecified organism: Secondary | ICD-10-CM | POA: Diagnosis not present

## 2022-01-26 DIAGNOSIS — N390 Urinary tract infection, site not specified: Secondary | ICD-10-CM | POA: Diagnosis not present

## 2022-01-26 DIAGNOSIS — Z794 Long term (current) use of insulin: Secondary | ICD-10-CM | POA: Diagnosis not present

## 2022-01-26 DIAGNOSIS — E1165 Type 2 diabetes mellitus with hyperglycemia: Secondary | ICD-10-CM | POA: Diagnosis not present

## 2022-01-26 LAB — CBC
HCT: 33.1 % — ABNORMAL LOW (ref 39.0–52.0)
Hemoglobin: 11.3 g/dL — ABNORMAL LOW (ref 13.0–17.0)
MCH: 32.2 pg (ref 26.0–34.0)
MCHC: 34.1 g/dL (ref 30.0–36.0)
MCV: 94.3 fL (ref 80.0–100.0)
Platelets: 307 10*3/uL (ref 150–400)
RBC: 3.51 MIL/uL — ABNORMAL LOW (ref 4.22–5.81)
RDW: 12.4 % (ref 11.5–15.5)
WBC: 9.1 10*3/uL (ref 4.0–10.5)
nRBC: 0 % (ref 0.0–0.2)

## 2022-01-26 LAB — BASIC METABOLIC PANEL
Anion gap: 8 (ref 5–15)
BUN: 9 mg/dL (ref 8–23)
CO2: 23 mmol/L (ref 22–32)
Calcium: 8.1 mg/dL — ABNORMAL LOW (ref 8.9–10.3)
Chloride: 101 mmol/L (ref 98–111)
Creatinine, Ser: 1.34 mg/dL — ABNORMAL HIGH (ref 0.61–1.24)
GFR, Estimated: 58 mL/min — ABNORMAL LOW (ref >60)
Glucose, Bld: 180 mg/dL — ABNORMAL HIGH (ref 70–99)
Potassium: 3.7 mmol/L (ref 3.5–5.1)
Sodium: 132 mmol/L — ABNORMAL LOW (ref 135–145)

## 2022-01-26 LAB — GLUCOSE, CAPILLARY
Glucose-Capillary: 197 mg/dL — ABNORMAL HIGH (ref 70–99)
Glucose-Capillary: 273 mg/dL — ABNORMAL HIGH (ref 70–99)
Glucose-Capillary: 204 mg/dL — ABNORMAL HIGH (ref 70–99)
Glucose-Capillary: 197 mg/dL — ABNORMAL HIGH (ref 70–99)
Glucose-Capillary: 70 mg/dL (ref 70–99)

## 2022-01-26 MED ORDER — METOCLOPRAMIDE HCL 10 MG PO TABS
5.0000 mg | ORAL_TABLET | Freq: Three times a day (TID) | ORAL | Status: DC
  Administered 2022-01-26 – 2022-01-27 (×3): 5 mg via ORAL
  Filled 2022-01-26 (×3): qty 1

## 2022-01-26 NOTE — Progress Notes (Signed)
Inpatient Diabetes Program Recommendations ? ?AACE/ADA: New Consensus Statement on Inpatient Glycemic Control (2015) ? ?Target Ranges:  Prepandial:   less than 140 mg/dL ?     Peak postprandial:   less than 180 mg/dL (1-2 hours) ?     Critically ill patients:  140 - 180 mg/dL  ? ?Lab Results  ?Component Value Date  ? GLUCAP 197 (H) 01/26/2022  ? HGBA1C 12.3 (H) 01/24/2022  ? ? ?Review of Glycemic Control ? Latest Reference Range & Units 01/25/22 08:03 01/25/22 11:46 01/25/22 13:55 01/25/22 16:09 01/25/22 21:31 01/25/22 21:58 01/26/22 07:55  ?Glucose-Capillary 70 - 99 mg/dL 572 (H) 620 (H) 355 (H) 329 (H) 52 (L) 105 (H) 197 (H)  ? ?Diabetes history: DM 2 ?Outpatient Diabetes medications: Glipizide 5 mg Daily, 70/30 44 units qam, 40 units qpm, Metformin 1000 mg bid ?Current orders for Inpatient glycemic control:  ?70/30 15 units bid ?Novolog 0-20 units tid ? ?Inpatient Diabetes Program Recommendations:   ? ?MD Note: Pt only received 70/30 8 units yesterday am with elevated glucose trends after in the 300's. Then received large Novolog dose (15 units) with 70/30 15 units resulting in hypoglycemia in the 50's. ? ?-  Consider reducing Novolog Correction to 0-15 units tid + hs scale ?-  Keep 70/30 at current dose ? ?Thanks, ? ?Christena Deem RN, MSN, BC-ADM ?Inpatient Diabetes Coordinator ?Team Pager [phone] (8a-5p) ? ? ? ? ?

## 2022-01-26 NOTE — Progress Notes (Signed)
? ?[facility] ?PROGRESS NOTE ? ? ?Ethan Peters NID:[id number] DOB: Jun 21, 1956 DOA: 01/22/2022  4 ?DOS: the patient was seen and examined on 01/26/2022 ? ?PCP: Dettinger, Elige Radon, MD ? ?Brief History and Hospital Course:  ?66 y.o. Caucasian male with medical history significant for coronary artery disease, diabetes mellitus, gout, hypertension, dyslipidemia, ischemic or myopathy and hypothyroidism, who presented to the ER with acute onset of diffuse abdominal distention with associated abdominal pain, nausea over 4 days.  Recently returned from [place].  Traveled back on March 31.  Noted to have fever.  Mentioned pain in his flank area.  He felt as if "his kidneys were hurting".  Found to be in DKA.  Hospitalized for further management.  Found to have a urinary tract infection and pneumonia. ? ?Consultants: None ? ?Procedures: None ? ? ? ?Subjective: ?Patient states that he is feeling better.  Abdominal pain is improved.  Nausea has resolved.   ? ? ?Assessment/Plan: ? ?* Uncontrolled type 2 diabetes mellitus with hyperglycemia, with long-term current use of insulin (HCC) ?DKA, Resolved ? ?DKA likely brought on by acute illness.  Appears to have a urinary tract infection and pneumonia. ?Was placed on IV insulin in the emergency department.  His anion gap closed and he was transitioned back to his usual insulin.   ?HbA1c 12.7 which implies poor compliance over the past few months. HbA1c was 6.3 in October. ?Diet was advanced.  CBGs have been fluctuating in part due to inconsistent oral intake. ?This was discussed with the patient.  He was educated.   ?Continue current dose of insulin for today. ?There was some concern for diabetic gastroparesis so he was placed on IV Reglan.  Nausea has improved.  Will change to oral.  ? ?AKI (acute kidney injury) (HCC) ?Hypokalemia ? ?Baseline renal function is normal with a creatinine of 1.06 on April 6.  Came in with with creatinine of 1.78.  Worsened to 1.86 and noted to be  improving subsequently.   ?Renal failure likely due to combination of DKA and poor oral intake with hypovolemia.  Avoid nephrotoxic agent.  He was on ARB which has been held. ?Renal function gradually improving.  Monitor urine output. ?Potassium is normal today. ? ?Acute urinary tract infection ?UA noted to be abnormal.  Patient complaining of flank pain.  Continue ceftriaxone.  Urine culture with multiple species.  ?Urine culture sent from his PCPs office growing Enterococcus hirae.  Sensitivities reviewed.  Cephalosporins apparently not effective against this organism.  He was changed over to Levaquin last night. ?Blood cultures have been negative. ? ?Community acquired pneumonia ?Chest x-ray she suggests left lower lobe bronchopneumonia.  Patient did have a cough.  COVID-19 and influenza PCR negative.   ?Recent travel from [place] noted.   ?WBC has improved.  Changed over to levofloxacin yesterday.  Continue to monitor for now. ? ?Ileus (HCC) ?Ileus was noted on abdominal film from 4/10.  A CT scan was done subsequently which did not show any evidence for bowel obstruction. ?No further episodes of vomiting.  Abdomen remains benign.  Patient encouraged to eat and drink. start mobilizing. ? ?CAD (coronary artery disease), native coronary artery ?Cardiac status is stable.  Holding his ARB for now. ? ?BPH (benign prostatic hyperplasia) ?Continue Flomax. ? ?Dyslipidemia ?Continue statin ? ?Gout ?Continue allopurinol ? ?Hypothyroidism, unspecified ?Continue levothyroxine ? ? ?DVT Prophylaxis: Lovenox ?Code Status: Full code ?Family Communication: Discussed with patient and his son ?Disposition Plan: Hopefully return home when improved.  Start mobilizing. ? ?  Status is: Inpatient ?Remains inpatient appropriate because: pneumonia, UTI ? ? ? ? ?Medications: Scheduled: ? allopurinol  300 mg Oral Daily  ? aspirin EC  81 mg Oral Daily  ? atorvastatin  80 mg Oral QPM  ? brimonidine  1 drop Both Eyes TID  ? cholecalciferol  1,000  Units Oral Daily  ? dorzolamide-timolol  1 drop Both Eyes BID  ? enoxaparin (LOVENOX) injection  40 mg Subcutaneous Q24H  ? insulin aspart  0-20 Units Subcutaneous TID WC  ? insulin aspart protamine- aspart  15 Units Subcutaneous BID WC  ? latanoprost  1 drop Both Eyes QHS  ? levofloxacin  500 mg Oral Daily  ? levothyroxine  50 mcg Oral QAC breakfast  ? metoCLOPramide  5 mg Oral TID AC  ? tamsulosin  0.4 mg Oral Daily  ? ?Continuous: ? sodium chloride 50 mL/hr at 01/25/22 1347  ? ?GBE:[id number] **OR** acetaminophen, dextrose, magnesium hydroxide, morphine injection, nitroGLYCERIN, ondansetron **OR** ondansetron (ZOFRAN) IV ? ?Antibiotics: ?Anti-infectives (From admission, onward)  ? ? Start     Dose/Rate Route Frequency Ordered Stop  ? 01/25/22 1915  levofloxacin (LEVAQUIN) tablet 500 mg       ? 500 mg Oral Daily 01/25/22 1817    ? 01/23/22 1000  azithromycin (ZITHROMAX) 500 mg in sodium chloride 0.9 % 250 mL IVPB  Status:  Discontinued       ? 500 mg ?250 mL/hr over 60 Minutes Intravenous Every 24 hours 01/23/22 0956 01/25/22 1817  ? 01/23/22 0915  cefTRIAXone (ROCEPHIN) 1 g in sodium chloride 0.9 % 100 mL IVPB  Status:  Discontinued       ? 1 g ?200 mL/hr over 30 Minutes Intravenous Every 24 hours 01/23/22 0900 01/25/22 1817  ? ?  ? ? ?Objective: ? ?Vital Signs ? ?Vitals:  ? 01/26/22 0700 01/26/22 0752 01/26/22 0753 01/26/22 0800  ?BP:   132/70   ?Pulse:   69   ?Resp: 18 17 16 12   ?Temp:   98.4 ?F (36.9 ?C)   ?TempSrc:  Oral Oral   ?SpO2:  98% 98%   ?Weight:      ?Height:      ? ? ?Intake/Output Summary (Last 24 hours) at 01/26/2022 1131 ?Last data filed at 01/26/2022 0509 ?Gross per 24 hour  ?Intake 1320.03 ml  ?Output 2150 ml  ?Net -829.97 ml  ? ? ?Filed Weights  ? 01/22/22 2119 01/23/22 2059  ?Weight: 72.6 kg 77.2 kg  ? ? ?General appearance: Awake alert.  In no distress ?Resp: Clear to auscultation bilaterally.  Normal effort ?Cardio: S1-S2 is normal regular.  No S3-S4.  No rubs murmurs or bruit ?GI: Abdomen  is soft.  Nontender nondistended.  Bowel sounds are present normal.  No masses organomegaly ?Extremities: No edema.  Full range of motion of lower extremities. ?Neurologic: No focal neurological deficits.  ? ? ? ? ? ?Lab Results: ? ?Data Reviewed: I have personally reviewed labs and imaging study reports ? ?CBC: ?Recent Labs  ?Lab 01/22/22 ?1624 01/22/22 ?2033 01/22/22 ?2235 01/23/22 ?0033 01/24/22 ?0130 01/25/22 ?0114 01/26/22 ?3254  ?WBC 17.8* 18.5*  --  18.3* 15.6* 9.6 9.1  ?NEUTROABS 15.7* 16.1*  --   --   --   --   --   ?HGB 14.7 14.2 14.3 12.4* 11.3* 10.5* 11.3*  ?HCT 42.6 40.0 42.0 35.9* 31.1* 31.2* 33.1*  ?MCV 93.8 92.6  --  94.7 90.7 94.8 94.3  ?PLT 281 244  --  235 236 260 307  ? ? ? ?  Basic Metabolic Panel: ?Recent Labs  ?Lab 01/23/22 ?2440 01/23/22 ?1618 01/24/22 ?0130 01/25/22 ?0114 01/26/22 ?1027  ?NA 131* 131* 131* 132* 132*  ?K 4.2 3.9 3.4* 3.4* 3.7  ?CL 100 100 103 103 101  ?CO2 23 22 21* 22 23  ?GLUCOSE 138* 124* 131* 188* 180*  ?BUN 20 15 14 10 9   ?CREATININE 1.86* 1.66* 1.53* 1.37* 1.34*  ?CALCIUM 8.6* 8.5* 7.9* 7.8* 8.1*  ?MG  --   --   --  1.7  --   ? ? ? ?GFR: ?Estimated Creatinine Clearance: 53.1 mL/min (A) (by C-G formula based on SCr of 1.34 mg/dL (H)). ? ?Liver Function Tests: ?Recent Labs  ?Lab 01/22/22 ?1624  ?AST 24  ?ALT 24  ?ALKPHOS 88  ?BILITOT 1.7*  ?PROT 8.1  ?ALBUMIN 3.4*  ? ? ? ?Recent Labs  ?Lab 01/22/22 ?1624  ?LIPASE 25  ? ? ? ?CBG: ?Recent Labs  ?Lab 01/25/22 ?1355 01/25/22 ?1609 01/25/22 ?2131 01/25/22 ?2158 01/26/22 ?0755  ?GLUCAP 307* 329* 52* 105* 197*  ? ? ? ? ?Recent Results (from the past 240 hour(s))  ?Urine Culture     Status: Abnormal  ? Collection Time: 01/18/22 11:51 AM  ? Specimen: Urine  ? UR  ?Result Value Ref Range Status  ? Urine Culture, Routine Final report (A)  Final  ? Organism ID, Bacteria Enterococcus hirae (A)  Final  ?  Comment: For Enterococcus species, aminoglycosides (except for high-level ?resistance screening), cephalosporins, clindamycin,  and ?trimethoprim-sulfamethoxazole are not effective clinically. ?(CLSI, M100-S26, 2016) ?50,000-100,000 colony forming units per mL ?  ? Antimicrobial Susceptibility Comment  Final  ?  Comment:       ** S

## 2022-01-27 DIAGNOSIS — Z794 Long term (current) use of insulin: Secondary | ICD-10-CM | POA: Diagnosis not present

## 2022-01-27 DIAGNOSIS — E1165 Type 2 diabetes mellitus with hyperglycemia: Secondary | ICD-10-CM | POA: Diagnosis not present

## 2022-01-27 LAB — CBC
HCT: 31.2 % — ABNORMAL LOW (ref 39.0–52.0)
Hemoglobin: 10.9 g/dL — ABNORMAL LOW (ref 13.0–17.0)
MCH: 32.3 pg (ref 26.0–34.0)
MCHC: 34.9 g/dL (ref 30.0–36.0)
MCV: 92.6 fL (ref 80.0–100.0)
Platelets: 335 10*3/uL (ref 150–400)
RBC: 3.37 MIL/uL — ABNORMAL LOW (ref 4.22–5.81)
RDW: 12.3 % (ref 11.5–15.5)
WBC: 8.1 10*3/uL (ref 4.0–10.5)
nRBC: 0 % (ref 0.0–0.2)

## 2022-01-27 LAB — BASIC METABOLIC PANEL
Anion gap: 7 (ref 5–15)
BUN: 11 mg/dL (ref 8–23)
CO2: 23 mmol/L (ref 22–32)
Calcium: 8.5 mg/dL — ABNORMAL LOW (ref 8.9–10.3)
Chloride: 106 mmol/L (ref 98–111)
Creatinine, Ser: 1.29 mg/dL — ABNORMAL HIGH (ref 0.61–1.24)
GFR, Estimated: >60 mL/min (ref >60)
Glucose, Bld: 127 mg/dL — ABNORMAL HIGH (ref 70–99)
Potassium: 3.2 mmol/L — ABNORMAL LOW (ref 3.5–5.1)
Sodium: 136 mmol/L (ref 135–145)

## 2022-01-27 LAB — GLUCOSE, CAPILLARY
Glucose-Capillary: 129 mg/dL — ABNORMAL HIGH (ref 70–99)
Glucose-Capillary: 134 mg/dL — ABNORMAL HIGH (ref 70–99)
Glucose-Capillary: 162 mg/dL — ABNORMAL HIGH (ref 70–99)
Glucose-Capillary: 179 mg/dL — ABNORMAL HIGH (ref 70–99)
Glucose-Capillary: 353 mg/dL — ABNORMAL HIGH (ref 70–99)

## 2022-01-27 MED ORDER — METOCLOPRAMIDE HCL 5 MG PO TABS: 5.0000 mg | ORAL_TABLET | Freq: Three times a day (TID) | ORAL | 1 refills | Status: DC

## 2022-01-27 MED ORDER — LEVOFLOXACIN 500 MG PO TABS: 500.0000 mg | ORAL_TABLET | Freq: Every day | ORAL | 0 refills | Status: AC

## 2022-01-27 MED ORDER — POTASSIUM CHLORIDE CRYS ER 20 MEQ PO TBCR
60.0000 meq | EXTENDED_RELEASE_TABLET | Freq: Once | ORAL | Status: AC
  Administered 2022-01-27: 60 meq via ORAL
  Filled 2022-01-27: qty 3

## 2022-01-27 MED ORDER — GLUCOSE BLOOD VI STRP: ORAL_STRIP | 12 refills | Status: DC

## 2022-01-27 MED ORDER — INSULIN ASPART PROT & ASPART (70-30 MIX) 100 UNIT/ML ~~LOC~~ SUSP
SUBCUTANEOUS | 3 refills | Status: DC
Start: 2022-01-27 — End: 2022-04-25

## 2022-01-27 NOTE — Progress Notes (Signed)
Pt's wife in room, discharge instructions given verbalized understanding. ?

## 2022-01-27 NOTE — Progress Notes (Signed)
Discharge instructions given with help of Spanish translator [place] [phone].  Pt verbalized understanding. ?

## 2022-01-27 NOTE — Discharge Summary (Signed)
?[facility] ? ?Physician Discharge Summary  ? ?Patient ID: ?Ethan Peters ?MRN: [id number] ?DOB/AGE: 1955/11/11 66 y.o. ? ?Admit date: 01/22/2022 ?Discharge date:   01/27/2022 ? ? ?PCP: Dettinger, Elige Radon, MD ? ?DISCHARGE DIAGNOSES:  ?Principal Problem: ?  Uncontrolled type 2 diabetes mellitus with hyperglycemia, with long-term current use of insulin (HCC) ?Active Problems: ?  AKI (acute kidney injury) (HCC) ?  Ileus (HCC) ?  Community acquired pneumonia ?  Acute urinary tract infection ?  CAD (coronary artery disease), native coronary artery ?  Dyslipidemia ?  BPH (benign prostatic hyperplasia) ?  Gout ?  Hypothyroidism, unspecified ? ? ? ?RECOMMENDATIONS FOR OUTPATIENT FOLLOW UP: ?Patient instructed to follow-up with his PCP ? ?Home Health: None ?Equipment/Devices: None ? ?CODE STATUS: Full code ? ?DISCHARGE CONDITION: fair ? ?Diet recommendation: Modified carbohydrate ? ?INITIAL HISTORY: ?66 y.o. Caucasian male with medical history significant for coronary artery disease, diabetes mellitus, gout, hypertension, dyslipidemia, ischemic or myopathy and hypothyroidism, who presented to the ER with acute onset of diffuse abdominal distention with associated abdominal pain, nausea over 4 days.  Recently returned from [place].  Traveled back on March 31.  Noted to have fever.  Mentioned pain in his flank area.  He felt as if "his kidneys were hurting".  Found to be in DKA.  Hospitalized for further management.  Found to have a urinary tract infection and pneumonia. ? ? ?HOSPITAL COURSE:  ? ?* Uncontrolled type 2 diabetes mellitus with hyperglycemia, with long-term current use of insulin (HCC) ?DKA, Resolved ? ?DKA likely brought on by acute illness.  Found to have a UTI and pneumonia. ?Was placed on IV insulin in the emergency department.  His anion gap closed and he was transitioned back to his usual insulin.   ?HbA1c 12.7 which implies poor compliance over the past few months. HbA1c was 6.3 in October. ?Diet was  advanced.  There was some concern for diabetic gastroparesis so he was placed on IV Reglan.  Nausea has improved.  Changed over to oral. ?CBGs are much better controlled in the last 24 hours.  Tolerating his diet well.  Feels better. ? ?AKI (acute kidney injury) (HCC) ?Hypokalemia ? ?Baseline renal function is normal with a creatinine of 1.06 on April 6.  Came in with with creatinine of 1.78.  Worsened to 1.86 and noted to be improving subsequently.   ?Renal failure likely due to combination of DKA and poor oral intake with hypovolemia.  Avoid nephrotoxic agent.  He was on ARB which has been held. ?Renal function gradually improving.  Patient reassured. ? ?Acute urinary tract infection ?UA noted to be abnormal.  Patient complaining of flank pain.  Continue ceftriaxone.  Urine culture with multiple species.  ?Urine culture sent from his PCPs office growing Enterococcus hirae.  Sensitivities reviewed.  Cephalosporins apparently not effective against this organism.  He was changed over to Levaquin.  WBC is normal.  Blood cultures negative.  Will be discharged on levofloxacin. ? ?Community acquired pneumonia ?Chest x-ray she suggests left lower lobe bronchopneumonia.  Patient did have a cough.  COVID-19 and influenza PCR negative.   ?Recent travel from [place] noted.   ?WBC has improved.  Changed over to levofloxacin.  ? ?Ileus (HCC) ?Ileus was noted on abdominal film from 4/10.  A CT scan was done subsequently which did not show any evidence for bowel obstruction. ?No further episodes of vomiting.  Abdomen remains benign.  ? ?CAD (coronary artery disease), native coronary artery ?Cardiac status is stable.   ? ?  BPH (benign prostatic hyperplasia) ?Continue Flomax. ? ?Dyslipidemia ?Continue statin ? ?Gout ?Continue allopurinol ? ?Hypothyroidism, unspecified ?Continue levothyroxine ? ? ? ?Patient feels better.  Okay for discharge home today. ? ? ?PERTINENT LABS: ? ?The results of significant diagnostics from this  hospitalization (including imaging, microbiology, ancillary and laboratory) are listed below for reference.   ? ?Microbiology: ?Recent Results (from the past 240 hour(s))  ?Urine Culture     Status: Abnormal  ? Collection Time: 01/18/22 11:51 AM  ? Specimen: Urine  ? UR  ?Result Value Ref Range Status  ? Urine Culture, Routine Final report (A)  Final  ? Organism ID, Bacteria Enterococcus hirae (A)  Final  ?  Comment: For Enterococcus species, aminoglycosides (except for high-level ?resistance screening), cephalosporins, clindamycin, and ?trimethoprim-sulfamethoxazole are not effective clinically. ?(CLSI, M100-S26, 2016) ?50,000-100,000 colony forming units per mL ?  ? Antimicrobial Susceptibility Comment  Final  ?  Comment:       ** S = Susceptible; I = Intermediate; R = Resistant ** ?                   P = Positive; N = Negative ?            MICS are expressed in micrograms per mL ?   Antibiotic                 RSLT#1    RSLT#2    RSLT#3    RSLT#4 ?Ciprofloxacin                  S ?Gentamicin 500                 S ?Levofloxacin                   S ?Nitrofurantoin                 S ?Penicillin                     S ?Streptomycin 2000              S ?Tetracycline                   S ?Vancomycin                     S ?  ?Culture, blood (routine x 2)     Status: None (Preliminary result)  ? Collection Time: 01/23/22  3:00 AM  ? Specimen: BLOOD  ?Result Value Ref Range Status  ? Specimen Description BLOOD SITE NOT SPECIFIED  Final  ? Special Requests   Final  ?  BOTTLES DRAWN AEROBIC AND ANAEROBIC Blood Culture results may not be optimal due to an inadequate volume of blood received in culture bottles  ? Culture   Final  ?  NO GROWTH 4 DAYS ?Performed at [facility], [address], [place], [place] [address] ?  ? Report Status PENDING  Incomplete  ?Culture, blood (routine x 2)     Status: None (Preliminary result)  ? Collection Time: 01/23/22  3:09 AM  ? Specimen: BLOOD  ?Result Value Ref Range Status  ?  Specimen Description BLOOD SITE NOT SPECIFIED  Final  ? Special Requests   Final  ?  BOTTLES DRAWN AEROBIC AND ANAEROBIC Blood Culture results may not be optimal due to an inadequate volume of blood received in culture bottles  ? Culture   Final  ?  NO GROWTH 4 DAYS ?Performed at [facility], [address] [place], [place] [address] ?  ? Report Status PENDING  Incomplete  ?Resp Panel by RT-PCR (Flu A&B, Covid) Nasopharyngeal Swab     Status: None  ? Collection Time: 01/23/22  6:47 AM  ? Specimen: Nasopharyngeal Swab; Nasopharyngeal(NP) swabs in vial transport medium  ?Result Value Ref Range Status  ? SARS Coronavirus 2 by RT PCR NEGATIVE NEGATIVE Final  ?  Comment: (NOTE) ?SARS-CoV-2 target nucleic acids are NOT DETECTED. ? ?The SARS-CoV-2 RNA is generally detectable in upper respiratory ?specimens during the acute phase of infection. The lowest ?concentration of SARS-CoV-2 viral copies this assay can detect is ?138 copies/mL. A negative result does not preclude SARS-Cov-2 ?infection and should not be used as the sole basis for treatment or ?other patient management decisions. A negative result may occur with  ?improper specimen collection/handling, submission of specimen other ?than nasopharyngeal swab, presence of viral mutation(s) within the ?areas targeted by this assay, and inadequate number of viral ?copies(<138 copies/mL). A negative result must be combined with ?clinical observations, patient history, and epidemiological ?information. The expected result is Negative. ? ?Fact Sheet for Patients:  ?BloggerCourse.com ? ?Fact Sheet for Healthcare Providers:  ?SeriousBroker.it ? ?This test is no t yet approved or cleared by the [place] FDA and  ?has been authorized for detection and/or diagnosis of SARS-CoV-2 by ?FDA under an Emergency Use Authorization (EUA). This EUA will remain  ?in effect (meaning this test can be used) for the duration of  the ?COVID-19 declaration under Section 564(b)(1) of the Act, 21 ?U.S.C.section 360bbb-3(b)(1), unless the authorization is terminated  ?or revoked sooner.  ? ? ?  ? Influenza A by PCR NEGATIVE NEGATIVE Final  ? Influenza B

## 2022-01-27 NOTE — Progress Notes (Signed)
Physical Therapy Evaluation and Discharge ?Patient Details ?Name: Ethan Peters ?MRN: [id number] ?DOB: 01/20/1956 ?Today's Date: 01/27/2022 ? ?History of Present Illness ? 66 y.o. male who presented to the ER 01/22/22 with acute onset of diffuse abdominal distention with associated abdominal pain, nausea over the last 4 days. +DKA, AKI, ileus, UTI   PMH significant for blindness, coronary artery disease, diabetes mellitus, gout, hypertension, dyslipidemia, ischemic cardiomyopathy and hypothyroidism,  ?Clinical Impression ?  ?Patient evaluated by Physical Therapy with no further acute PT needs identified. Patient is at his baseline.  PT is signing off. Thank you for this referral. ?  ? ?Recommendations for follow up therapy are one component of a multi-disciplinary discharge planning process, led by the attending physician.  Recommendations may be updated based on patient status, additional functional criteria and insurance authorization. ? ?Follow Up Recommendations No PT follow up ? ?  ?Assistance Recommended at Discharge PRN  ?Patient can return home with the following ?   ? ?  ?Equipment Recommendations None recommended by PT  ?Recommendations for Other Services ?    ?  ?Functional Status Assessment Patient has not had a recent decline in their functional status  ? ?  ?Precautions / Restrictions Precautions ?Precautions: Other (comment) ?Precaution Comments: blind and uses white cane  ? ?  ? ?Mobility ? Bed Mobility ?Overal bed mobility: Independent ?  ?  ?  ?  ?  ?  ?  ?  ? ?Transfers ?Overall transfer level: Independent ?Equipment used:  (blind cane) ?  ?  ?  ?  ?  ?  ?  ?  ?  ? ?Ambulation/Gait ?Ambulation/Gait assistance: Independent ?Gait Distance (Feet): 150 Feet ?Assistive device:  (blind cane and holding PT's arm) ?Gait Pattern/deviations: Step-through pattern, Decreased stride length ?Gait velocity: decr due to bringing equipment (interpreter ipad, telemetry box) with [place] ?  ?  ?General Gait Details: no  imbalance noted ? ?Stairs ?  ?  ?  ?  ?  ? ?Wheelchair Mobility ?  ? ?Modified Rankin (Stroke Patients Only) ?  ? ?  ? ?Balance Overall balance assessment: Independent ?  ?  ?  ?  ?  ?  ?  ?  ?  ?  ?  ?  ?  ?  ?  ?  ?  ?  ?   ? ? ? ?Pertinent Vitals/Pain Pain Assessment ?Pain Assessment: No/denies pain  ? ? ?Home Living Family/patient expects to be discharged to:: Private residence ?Living Arrangements: Other relatives ?  ?  ?  ?  ?  ?  ?  ?  ?   ?  ?Prior Function   ?  ?  ?  ?  ?  ?  ?Mobility Comments: Independent with mobility using blind/white cane ?  ?  ? ? ?Hand Dominance  ?   ? ?  ?Extremity/Trunk Assessment  ? Upper Extremity Assessment ?Upper Extremity Assessment: Overall WFL for tasks assessed ?  ? ?Lower Extremity Assessment ?Lower Extremity Assessment: Overall WFL for tasks assessed ?  ? ?Cervical / Trunk Assessment ?Cervical / Trunk Assessment: Normal  ?Communication  ? Communication: Prefers language other than [place];Interpreter utilized  ?Cognition Arousal/Alertness: Awake/alert ?Behavior During Therapy: [facility] for tasks assessed/performed ?Overall Cognitive Status: Within Functional Limits for tasks assessed ?  ?  ?  ?  ?  ?  ?  ?  ?  ?  ?  ?  ?  ?  ?  ?  ?  ?  ?  ? ?  ?  General Comments General comments (skin integrity, edema, etc.): VSS on telemetry monitor ? ?  ?Exercises    ? ?Assessment/Plan  ?  ?PT Assessment Patient does not need any further PT services  ?PT Problem List   ? ?   ?  ?PT Treatment Interventions     ? ?PT Goals (Current goals can be found in the Care Plan section)  ?Acute Rehab PT Goals ?PT Goal Formulation: All assessment and education complete, DC therapy ? ?  ?Frequency   ?  ? ? ?Co-evaluation   ?  ?  ?  ?  ? ? ?  ?AM-PAC PT "6 Clicks" Mobility  ?Outcome Measure Help needed turning from your back to your side while in a flat bed without using bedrails?: None ?Help needed moving from lying on your back to sitting on the side of a flat bed without using bedrails?: None ?Help  needed moving to and from a bed to a chair (including a wheelchair)?: None ?Help needed standing up from a chair using your arms (e.g., wheelchair or bedside chair)?: None ?Help needed to walk in hospital room?: None ?Help needed climbing 3-5 steps with a railing? : None ?6 Click Score: 24 ? ?  ?End of Session Equipment Utilized During Treatment: Other (comment) (interpreter ipad,) ?Activity Tolerance: Patient tolerated treatment well ?Patient left: in bed;with call bell/phone within reach ?  ?PT Visit Diagnosis: Difficulty in walking, not elsewhere classified (R26.2) ?  ? ?Time: 1829-9371 ?PT Time Calculation (min) (ACUTE ONLY): 29 min ? ? ?Charges:   PT Evaluation ?$PT Eval Low Complexity: 1 Low ?  ?  ?   ? ? ? ?Jerolyn Center, PT ?Acute Rehabilitation Services  ?Pager [phone] ?Office [phone] ? ? ?Scherrie November Hardeep Reetz ?01/27/2022, 9:59 AM ?

## 2022-01-28 LAB — CULTURE, BLOOD (ROUTINE X 2)
Culture: NO GROWTH
Culture: NO GROWTH

## 2022-01-29 ENCOUNTER — Telehealth: Payer: Self-pay

## 2022-01-29 NOTE — Telephone Encounter (Signed)
Transition Care Management Follow-up Telephone Call ?Date of discharge and from where: 01/27/22 - [facility] - [facility] ?How have you been since you were released from the hospital? Seems to be slowly getting better ?Any questions or concerns? No ? ?Items Reviewed: ?Did the pt receive and understand the discharge instructions provided? Yes  ?Medications obtained and verified? Yes  ?Other? No  ?Any new allergies since your discharge? No  ?Dietary orders reviewed? Yes ?Do you have support at home? Yes  ? ?Home Care and Equipment/Supplies: ?Were home health services ordered? no ? ?Were any new equipment or medical supplies ordered?  No ? ? ?Functional Questionnaire: (I = Independent and D = Dependent) ?ADLs: I ? ?Bathing/Dressing- I ? ?Meal Prep- I ? ?Eating- I ? ?Maintaining continence- I ? ?Transferring/Ambulation- I ? ?Managing Meds- I ? ?Follow up appointments reviewed: ? ?PCP Hospital f/u appt confirmed? Yes  Scheduled to see Hendricks Limes as Dettinger was unavailable on 01/31/2022 @ 8:35. ?[facility] f/u appt confirmed? No   ?Are transportation arrangements needed? No  ?If their condition worsens, is the pt aware to call PCP or go to the Emergency Dept.? Yes ?Was the patient provided with contact information for the PCP's office or ED? Yes ?Was to pt encouraged to call back with questions or concerns? Yes  ?

## 2022-01-31 ENCOUNTER — Ambulatory Visit (INDEPENDENT_AMBULATORY_CARE_PROVIDER_SITE_OTHER): Payer: PPO | Admitting: Family Medicine

## 2022-01-31 ENCOUNTER — Encounter: Payer: Self-pay | Admitting: Family Medicine

## 2022-01-31 VITALS — BP 112/64 | HR 74 | Temp 97.2°F | Ht 66.0 in | Wt 157.8 lb

## 2022-01-31 DIAGNOSIS — K3184 Gastroparesis: Secondary | ICD-10-CM | POA: Diagnosis not present

## 2022-01-31 DIAGNOSIS — E1165 Type 2 diabetes mellitus with hyperglycemia: Secondary | ICD-10-CM

## 2022-01-31 DIAGNOSIS — Z09 Encounter for follow-up examination after completed treatment for conditions other than malignant neoplasm: Secondary | ICD-10-CM | POA: Diagnosis not present

## 2022-01-31 DIAGNOSIS — J189 Pneumonia, unspecified organism: Secondary | ICD-10-CM | POA: Diagnosis not present

## 2022-01-31 DIAGNOSIS — T82898A Other specified complication of vascular prosthetic devices, implants and grafts, initial encounter: Secondary | ICD-10-CM

## 2022-01-31 DIAGNOSIS — Z794 Long term (current) use of insulin: Secondary | ICD-10-CM

## 2022-01-31 DIAGNOSIS — E1110 Type 2 diabetes mellitus with ketoacidosis without coma: Secondary | ICD-10-CM

## 2022-01-31 DIAGNOSIS — N3001 Acute cystitis with hematuria: Secondary | ICD-10-CM

## 2022-01-31 DIAGNOSIS — E1143 Type 2 diabetes mellitus with diabetic autonomic (poly)neuropathy: Secondary | ICD-10-CM

## 2022-01-31 NOTE — Progress Notes (Signed)
? ?Assessment & Plan:  ?1. Acute cystitis with hematuria ?Complete antibiotics. Patient will bring urine specimen a week after completing antibiotics.  ?- Urinalysis, Routine w reflex microscopic; Future ? ?2. Community acquired pneumonia of left lower lobe of lung ?Continue antibiotics. ? ?3. Diabetic ketoacidosis without coma associated with type 2 diabetes mellitus ([place]) ?Resolved. ?- CBC with Differential/Platelet ?- CMP14+EGFR ? ?4. Diabetic gastroparesis ([place]) ?Continue Reglan. ? ?5. Hematoma of intravenous catheter site, initial encounter [facility]) ?Encouraged warm compresses. ? ?6. Hospital discharge follow-up ? ?7. Uncontrolled type 2 diabetes mellitus with hyperglycemia, with long-term current use of insulin ([place]) ?Strongly recommended a freestyle libre; patient would like to think about it. ? ? ?Follow up plan: Return if symptoms worsen or fail to improve. ? ?Hendricks Limes, MSN, APRN, FNP-C ?[place] ? ?Subjective:  ? ?Patient ID: Ethan Peters, male    DOB: 30-Jan-1956, 66 y.o.   MRN: [id number] ? ?HPI: ?Ethan Peters is a 66 y.o. male presenting on 01/31/2022 for hopsital follow up (4/10 [facility]- diabetic ketoacidosis . Patient has a knot where they had the IV in her right arm.  States that when he was getting the IV his arm got really swollen and the ER did not care. ) and Abdominal Pain (Patient is still having abd pain but it is not as bad as it was when he went to the hospital. ) ? ?Patient is accompanied by wife, who he is okay with being present. ? ?Patient was hospitalized at [facility] 01/22/2022-01/27/2022 due to DKA, UTI, and pneumonia.  It was felt DKA was brought on by acute illness.  A1c had increased to 12.7 from 6.3 five months prior.  He was treated with IV insulin and IV Reglan due to diabetic gastroparesis.  This was transitioned over to oral.  UTI and pneumonia treated with Levaquin. ? ?Today patient is concerned that while he was in the ER his arm got really  swollen where his IV was.  He is afraid it is a blood clot.  He reports today he is still having abdominal pain, however it is not as bad as when he went to the hospital.  He is continuing Levaquin and will finish this tomorrow. ? ?Patient's wife is concerned that patient keeps having hypoglycemia events during the night. States he wakes up feeling very shaky and just doesn't feel good. He is eating a snicker bar when this occurs and then his blood sugar is 200-300 the next morning. He reports he was previously well controlled before going home to his county of [place]. States he ran out of medication the last 1.5 months of his trip which was why he was uncontrolled upon his return. He does have an endocrinologist, but is unable to tell me the last time he saw them.  ? ? ?ROS: Negative unless specifically indicated above in HPI.  ? ?Relevant past medical history reviewed and updated as indicated.  ? ?Allergies and medications reviewed and updated. ? ? ?Current Outpatient Medications:  ?  allopurinol (ZYLOPRIM) 300 MG tablet, TAKE ONE TABLET BY MOUTH ONCE DAILY AS NEEDED FOR GOUT prevention (Patient taking differently: Take 300 mg by mouth daily.), Disp: 90 tablet, Rfl: 3 ?  aspirin EC 81 MG EC tablet, Take 1 tablet (81 mg total) by mouth daily., Disp: , Rfl:  ?  atorvastatin (LIPITOR) 80 MG tablet, TAKE 1 TABLET BY MOUTH ONCE DAILY AT 6 PM FOR CHOLESTEROL (Patient taking differently: Take 80 mg by mouth daily.), Disp:  90 tablet, Rfl: 3 ?  B Complex-C (B-COMPLEX WITH VITAMIN C) tablet, Take 1 tablet by mouth as needed (for vitamin deficiency)., Disp: , Rfl:  ?  brimonidine (ALPHAGAN) 0.2 % ophthalmic solution, Place 1 drop into both eyes 3 (three) times daily. Give 90-day supply at a time, Disp: 15 mL, Rfl: 3 ?  candesartan (ATACAND) 16 MG tablet, Take 1 tablet (16 mg total) by mouth in the morning and at bedtime. (Patient taking differently: Take 16 mg by mouth daily as needed (blood pressure).), Disp: 180 tablet, Rfl:  3 ?  cholecalciferol (VITAMIN D3) 25 MCG (1000 UNIT) tablet, Take 1,000 Units by mouth daily., Disp: , Rfl:  ?  dorzolamide-timolol (COSOPT) 22.3-6.8 MG/ML ophthalmic solution, 1 DROP BOTH EYES TWICE A DAY. SEPARATE BY AT LEAST 10 MINUTES FROM OTHER PRESSURE REDUCING EYE DROPS (Patient taking differently: Place 1 drop into both eyes 2 (two) times daily.), Disp: 20 mL, Rfl: 5 ?  glipiZIDE (GLUCOTROL) 5 MG tablet, Take 1 tablet (5 mg total) by mouth daily before breakfast., Disp: 90 tablet, Rfl: 3 ?  glucose blood test strip, Use as instructed, Disp: 100 each, Rfl: 12 ?  insulin aspart protamine- aspart (NOVOLOG MIX 70/30) (70-30) 100 UNIT/ML injection, 20 units before breakfast and 20 units before supper daily., Disp: 90 mL, Rfl: 3 ?  latanoprost (XALATAN) 0.005 % ophthalmic solution, INSTILL 1 DROP IN BOTH EYES EVERY EVENING (Patient taking differently: Place 1 drop into both eyes at bedtime.), Disp: 7.5 mL, Rfl: 3 ?  levofloxacin (LEVAQUIN) 500 MG tablet, Take 1 tablet (500 mg total) by mouth daily for 5 days., Disp: 5 tablet, Rfl: 0 ?  levothyroxine (SYNTHROID) 50 MCG tablet, Take 1 tablet (50 mcg total) by mouth daily., Disp: 90 tablet, Rfl: 3 ?  metFORMIN (GLUCOPHAGE) 1000 MG tablet, Take 1 tablet (1,000 mg total) by mouth 2 (two) times daily with a meal., Disp: 180 tablet, Rfl: 3 ?  metoCLOPramide (REGLAN) 5 MG tablet, Take 1 tablet (5 mg total) by mouth 3 (three) times daily before meals., Disp: 90 tablet, Rfl: 1 ?  nitroGLYCERIN (NITROSTAT) 0.4 MG SL tablet, Place 1 tablet (0.4 mg total) under the tongue every 5 (five) minutes x 3 doses as needed for chest pain., Disp: 75 tablet, Rfl: 3 ?  tamsulosin (FLOMAX) 0.4 MG CAPS capsule, Take 0.4 mg by mouth daily., Disp: , Rfl:  ? ?Allergies  ?Allergen Reactions  ? Levemir [Insulin Detemir] Swelling  ? Lisinopril Cough  ? Penicillins Rash  ?  Lip swelling  ? ? ?Objective:  ? ?BP 112/64   Pulse 74   Temp (!) 97.2 ?F (36.2 ?C) (Temporal)   Ht '5\' 6"'  (1.676 m)   Wt  157 lb 12.8 oz (71.6 kg)   SpO2 98%   BMI 25.47 kg/m?   ? ?Physical Exam ?Vitals reviewed.  ?Constitutional:   ?   General: He is not in acute distress. ?   Appearance: Normal appearance. He is not ill-appearing, toxic-appearing or diaphoretic.  ?HENT:  ?   Head: Normocephalic and atraumatic.  ?Eyes:  ?   General: No scleral icterus.    ?   Right eye: No discharge.     ?   Left eye: No discharge.  ?   Conjunctiva/sclera: Conjunctivae normal.  ?Cardiovascular:  ?   Rate and Rhythm: Normal rate and regular rhythm.  ?   Heart sounds: Normal heart sounds. No murmur heard. ?  No friction rub. No gallop.  ?Pulmonary:  ?  Effort: Pulmonary effort is normal. No respiratory distress.  ?   Breath sounds: Normal breath sounds. No stridor. No wheezing, rhonchi or rales.  ?Musculoskeletal:     ?   General: Normal range of motion.  ?   Cervical back: Normal range of motion.  ?Skin: ?   General: Skin is warm and dry.  ?   Comments: Small hematoma to right arm.  ?Neurological:  ?   Mental Status: He is alert and oriented to person, place, and time. Mental status is at baseline.  ?   Gait: Gait abnormal (uses walking stick due to blindness).  ?Psychiatric:     ?   Mood and Affect: Mood normal.     ?   Behavior: Behavior normal.     ?   Thought Content: Thought content normal.     ?   Judgment: Judgment normal.  ? ? ? ? ? ? ?

## 2022-02-01 LAB — CMP14+EGFR
ALT: 75 IU/L — ABNORMAL HIGH (ref 0–44)
AST: 45 IU/L — ABNORMAL HIGH (ref 0–40)
Albumin/Globulin Ratio: 1.1 — ABNORMAL LOW (ref 1.2–2.2)
Albumin: 4 g/dL (ref 3.8–4.8)
Alkaline Phosphatase: 160 IU/L — ABNORMAL HIGH (ref 44–121)
BUN/Creatinine Ratio: 14 (ref 10–24)
BUN: 23 mg/dL (ref 8–27)
Bilirubin Total: 0.4 mg/dL (ref 0.0–1.2)
CO2: 20 mmol/L (ref 20–29)
Calcium: 10.2 mg/dL (ref 8.6–10.2)
Chloride: 103 mmol/L (ref 96–106)
Creatinine, Ser: 1.63 mg/dL — ABNORMAL HIGH (ref 0.76–1.27)
Globulin, Total: 3.8 g/dL (ref 1.5–4.5)
Glucose: 178 mg/dL — ABNORMAL HIGH (ref 70–99)
Potassium: 6.1 mmol/L (ref 3.5–5.2)
Sodium: 139 mmol/L (ref 134–144)
Total Protein: 7.8 g/dL (ref 6.0–8.5)
eGFR: 46 mL/min/{1.73_m2} — ABNORMAL LOW (ref >59)

## 2022-02-01 LAB — CBC WITH DIFFERENTIAL/PLATELET
Basophils Absolute: 0.1 10*3/uL (ref 0.0–0.2)
Basos: 1 %
EOS (ABSOLUTE): 0.2 10*3/uL (ref 0.0–0.4)
Eos: 2 %
Hematocrit: 41 % (ref 37.5–51.0)
Hemoglobin: 13.8 g/dL (ref 13.0–17.7)
Immature Grans (Abs): 0.1 10*3/uL (ref 0.0–0.1)
Immature Granulocytes: 1 %
Lymphocytes Absolute: 2 10*3/uL (ref 0.7–3.1)
Lymphs: 24 %
MCH: 31.9 pg (ref 26.6–33.0)
MCHC: 33.7 g/dL (ref 31.5–35.7)
MCV: 95 fL (ref 79–97)
Monocytes Absolute: 0.6 10*3/uL (ref 0.1–0.9)
Monocytes: 8 %
Neutrophils Absolute: 5.3 10*3/uL (ref 1.4–7.0)
Neutrophils: 64 %
Platelets: 571 10*3/uL — ABNORMAL HIGH (ref 150–450)
RBC: 4.32 x10E6/uL (ref 4.14–5.80)
RDW: 12 % (ref 11.6–15.4)
WBC: 8.3 10*3/uL (ref 3.4–10.8)

## 2022-02-01 NOTE — Progress Notes (Signed)
Aware and verbalizes understanding. Appointment scheduled for Monday.   ?

## 2022-02-05 ENCOUNTER — Ambulatory Visit (INDEPENDENT_AMBULATORY_CARE_PROVIDER_SITE_OTHER): Payer: PPO | Admitting: Family Medicine

## 2022-02-05 ENCOUNTER — Encounter: Payer: Self-pay | Admitting: Family Medicine

## 2022-02-05 VITALS — BP 118/65 | HR 55 | Temp 96.7°F | Ht 66.0 in | Wt 164.4 lb

## 2022-02-05 DIAGNOSIS — N179 Acute kidney failure, unspecified: Secondary | ICD-10-CM | POA: Diagnosis not present

## 2022-02-05 DIAGNOSIS — N1832 Chronic kidney disease, stage 3b: Secondary | ICD-10-CM

## 2022-02-05 DIAGNOSIS — E875 Hyperkalemia: Secondary | ICD-10-CM

## 2022-02-05 DIAGNOSIS — N3000 Acute cystitis without hematuria: Secondary | ICD-10-CM

## 2022-02-05 LAB — URINALYSIS, COMPLETE
Bilirubin, UA: NEGATIVE
Glucose, UA: NEGATIVE
Ketones, UA: NEGATIVE
Nitrite, UA: NEGATIVE
Protein,UA: NEGATIVE
Specific Gravity, UA: <1.005 — ABNORMAL LOW (ref 1.005–1.030)
Urobilinogen, Ur: 0.2 mg/dL (ref 0.2–1.0)
pH, UA: 5.5 (ref 5.0–7.5)

## 2022-02-05 LAB — MICROSCOPIC EXAMINATION
Bacteria, UA: NONE SEEN
Epithelial Cells (non renal): NONE SEEN /hpf (ref 0–10)
RBC, Urine: NONE SEEN /hpf (ref 0–2)
Renal Epithel, UA: NONE SEEN /hpf

## 2022-02-05 NOTE — Progress Notes (Signed)
? ?BP 118/65   Pulse (!) 55   Temp (!) 96.7 ?F (35.9 ?C) (Temporal)   Ht '5\' 6"'  (1.676 m)   Wt 164 lb 6.4 oz (74.6 kg)   BMI 26.53 kg/m?   ? ?Subjective:  ? ?Patient ID: Ethan Peters, male    DOB: 1955-11-15, 66 y.o.   MRN: [id number] ? ?HPI: ?Ethan Peters is a 66 y.o. male presenting on 02/05/2022 for re check potassium ? ? ?HPI ?Patient is coming in for follow-up on urinary tract infection and hyperkalemia and acute renal failure.  He was seen for hospital follow-up after being in DKA and urinary tract infection and possible pneumonia and was treated with antibiotics and is feeling a lot better and his appetite is better and he is increasing his hydration.  He sometimes gets a little bit of dysuria at the head of his penis but other than that he is feeling pretty well.  He denies any fevers or chills or flank pain.  He still has a little bit of a sore in his left side of his back where he was having flank pain but it is much better.  He says he is making good urine and denies any blood in his urine.  He says his blood sugars have been running better and he is keeping closer eye on them. ? ?Relevant past medical, surgical, family and social history reviewed and updated as indicated. Interim medical history since our last visit reviewed. ?Allergies and medications reviewed and updated. ? ?Review of Systems  ?Constitutional:  Negative for chills and fever.  ?Respiratory:  Negative for shortness of breath and wheezing.   ?Cardiovascular:  Negative for chest pain and leg swelling.  ?Gastrointestinal:  Negative for abdominal pain.  ?Genitourinary:  Positive for dysuria. Negative for flank pain, frequency, hematuria and urgency.  ?Musculoskeletal:  Positive for back pain. Negative for gait problem.  ?Skin:  Negative for color change and rash.  ?Neurological:  Negative for dizziness, weakness and light-headedness.  ?All other systems reviewed and are negative. ? ?Per HPI unless specifically indicated  above ? ? ?Allergies as of 02/05/2022   ? ?   Reactions  ? Levemir [insulin Detemir] Swelling  ? Lisinopril Cough  ? Penicillins Rash  ? Lip swelling  ? ?  ? ?  ?Medication List  ?  ? ?  ? Accurate as of February 05, 2022  9:41 AM. If you have any questions, ask your nurse or doctor.  ?  ?  ? ?  ? ?allopurinol 300 MG tablet ?Commonly known as: ZYLOPRIM ?TAKE ONE TABLET BY MOUTH ONCE DAILY AS NEEDED FOR GOUT prevention ?What changed:  ?how much to take ?how to take this ?when to take this ?additional instructions ?  ?aspirin 81 MG EC tablet ?Take 1 tablet (81 mg total) by mouth daily. ?  ?atorvastatin 80 MG tablet ?Commonly known as: LIPITOR ?TAKE 1 TABLET BY MOUTH ONCE DAILY AT 6 PM FOR CHOLESTEROL ?What changed:  ?how much to take ?how to take this ?when to take this ?additional instructions ?  ?B-complex with vitamin C tablet ?Take 1 tablet by mouth as needed (for vitamin deficiency). ?  ?brimonidine 0.2 % ophthalmic solution ?Commonly known as: ALPHAGAN ?Place 1 drop into both eyes 3 (three) times daily. Give 90-day supply at a time ?  ?candesartan 16 MG tablet ?Commonly known as: ATACAND ?Take 1 tablet (16 mg total) by mouth in the morning and at bedtime. ?What changed:  ?when to take  this ?reasons to take this ?  ?cholecalciferol 25 MCG (1000 UNIT) tablet ?Commonly known as: VITAMIN D3 ?Take 1,000 Units by mouth daily. ?  ?dorzolamide-timolol 22.3-6.8 MG/ML ophthalmic solution ?Commonly known as: COSOPT ?1 DROP BOTH EYES TWICE A DAY. SEPARATE BY AT LEAST 10 MINUTES FROM OTHER PRESSURE REDUCING EYE DROPS ?What changed: See the new instructions. ?  ?glipiZIDE 5 MG tablet ?Commonly known as: GLUCOTROL ?Take 1 tablet (5 mg total) by mouth daily before breakfast. ?  ?glucose blood test strip ?Use as instructed ?  ?insulin aspart protamine- aspart (70-30) 100 UNIT/ML injection ?Commonly known as: NovoLOG Mix 70/30 ?20 units before breakfast and 20 units before supper daily. ?  ?latanoprost 0.005 % ophthalmic  solution ?Commonly known as: XALATAN ?INSTILL 1 DROP IN BOTH EYES EVERY EVENING ?What changed: See the new instructions. ?  ?levothyroxine 50 MCG tablet ?Commonly known as: SYNTHROID ?Take 1 tablet (50 mcg total) by mouth daily. ?  ?metFORMIN 1000 MG tablet ?Commonly known as: GLUCOPHAGE ?Take 1 tablet (1,000 mg total) by mouth 2 (two) times daily with a meal. ?  ?metoCLOPramide 5 MG tablet ?Commonly known as: REGLAN ?Take 1 tablet (5 mg total) by mouth 3 (three) times daily before meals. ?  ?nitroGLYCERIN 0.4 MG SL tablet ?Commonly known as: NITROSTAT ?Place 1 tablet (0.4 mg total) under the tongue every 5 (five) minutes x 3 doses as needed for chest pain. ?  ?tamsulosin 0.4 MG Caps capsule ?Commonly known as: FLOMAX ?Take 0.4 mg by mouth daily. ?  ? ?  ? ? ? ?Objective:  ? ?BP 118/65   Pulse (!) 55   Temp (!) 96.7 ?F (35.9 ?C) (Temporal)   Ht '5\' 6"'  (1.676 m)   Wt 164 lb 6.4 oz (74.6 kg)   BMI 26.53 kg/m?   ?Wt Readings from Last 3 Encounters:  ?02/05/22 164 lb 6.4 oz (74.6 kg)  ?01/31/22 157 lb 12.8 oz (71.6 kg)  ?01/23/22 170 lb 3.1 oz (77.2 kg)  ?  ?Physical Exam ?Vitals and nursing note reviewed.  ?Constitutional:   ?   General: He is not in acute distress. ?   Appearance: He is well-developed. He is not diaphoretic.  ?Eyes:  ?   General: No scleral icterus. ?   Conjunctiva/sclera: Conjunctivae normal.  ?Neck:  ?   Thyroid: No thyromegaly.  ?Abdominal:  ?   General: Abdomen is flat. Bowel sounds are normal. There is no distension.  ?   Palpations: Abdomen is soft.  ?   Tenderness: There is no abdominal tenderness. There is no right CVA tenderness, left CVA tenderness, guarding or rebound.  ?   Hernia: No hernia is present.  ?Musculoskeletal:  ?   Cervical back: Neck supple.  ?Lymphadenopathy:  ?   Cervical: No cervical adenopathy.  ?Skin: ?   General: Skin is warm and dry.  ?   Findings: No rash.  ?Neurological:  ?   Mental Status: He is alert and oriented to person, place, and time.  ?   Coordination:  Coordination normal.  ?Psychiatric:     ?   Behavior: Behavior normal.  ? ? ? ? ?Assessment & Plan:  ? ?Problem List Items Addressed This Visit   ?None ?Visit Diagnoses   ? ? Hyperkalemia    -  Primary  ? Relevant Orders  ? CMP14+EGFR  ? CBC with Differential/Platelet  ? Acute renal failure superimposed on stage 3b chronic kidney disease, unspecified acute renal failure type ([place])      ?  Relevant Orders  ? Urinalysis, Complete  ? Urine Culture  ? CMP14+EGFR  ? CBC with Differential/Platelet  ? Acute cystitis without hematuria      ? Relevant Orders  ? Urinalysis, Complete  ? Urine Culture  ? CMP14+EGFR  ? CBC with Differential/Platelet  ? ?  ?  ?Patient had elevated potassium and elevated renal function recently, we will recheck these today and see if it is improved ?Follow up plan: ?Return if symptoms worsen or fail to improve. ? ?Counseling provided for all of the vaccine components ?Orders Placed This Encounter  ?Procedures  ? Urine Culture  ? Urinalysis, Complete  ? CMP14+EGFR  ? CBC with Differential/Platelet  ? ? ?Caryl Pina, MD ?[place] ?02/05/2022, 9:41 AM ? ? ? ? ?

## 2022-02-06 LAB — CMP14+EGFR
ALT: 33 IU/L (ref 0–44)
AST: 24 IU/L (ref 0–40)
Albumin/Globulin Ratio: 1 — ABNORMAL LOW (ref 1.2–2.2)
Albumin: 3.8 g/dL (ref 3.8–4.8)
Alkaline Phosphatase: 119 IU/L (ref 44–121)
BUN/Creatinine Ratio: 15 (ref 10–24)
BUN: 22 mg/dL (ref 8–27)
Bilirubin Total: <0.2 mg/dL (ref 0.0–1.2)
CO2: 22 mmol/L (ref 20–29)
Calcium: 9.5 mg/dL (ref 8.6–10.2)
Chloride: 107 mmol/L — ABNORMAL HIGH (ref 96–106)
Creatinine, Ser: 1.44 mg/dL — ABNORMAL HIGH (ref 0.76–1.27)
Globulin, Total: 3.9 g/dL (ref 1.5–4.5)
Glucose: 116 mg/dL — ABNORMAL HIGH (ref 70–99)
Potassium: 5.3 mmol/L — ABNORMAL HIGH (ref 3.5–5.2)
Sodium: 143 mmol/L (ref 134–144)
Total Protein: 7.7 g/dL (ref 6.0–8.5)
eGFR: 54 mL/min/{1.73_m2} — ABNORMAL LOW (ref >59)

## 2022-02-06 LAB — CBC WITH DIFFERENTIAL/PLATELET
Basophils Absolute: 0.1 10*3/uL (ref 0.0–0.2)
Basos: 1 %
EOS (ABSOLUTE): 0.2 10*3/uL (ref 0.0–0.4)
Eos: 2 %
Hematocrit: 39.5 % (ref 37.5–51.0)
Hemoglobin: 13.1 g/dL (ref 13.0–17.7)
Immature Grans (Abs): 0 10*3/uL (ref 0.0–0.1)
Immature Granulocytes: 0 %
Lymphocytes Absolute: 2.3 10*3/uL (ref 0.7–3.1)
Lymphs: 26 %
MCH: 31.8 pg (ref 26.6–33.0)
MCHC: 33.2 g/dL (ref 31.5–35.7)
MCV: 96 fL (ref 79–97)
Monocytes Absolute: 0.4 10*3/uL (ref 0.1–0.9)
Monocytes: 5 %
Neutrophils Absolute: 5.9 10*3/uL (ref 1.4–7.0)
Neutrophils: 66 %
Platelets: 546 10*3/uL — ABNORMAL HIGH (ref 150–450)
RBC: 4.12 x10E6/uL — ABNORMAL LOW (ref 4.14–5.80)
RDW: 12 % (ref 11.6–15.4)
WBC: 9 10*3/uL (ref 3.4–10.8)

## 2022-02-06 LAB — URINE CULTURE: Organism ID, Bacteria: NO GROWTH

## 2022-03-01 ENCOUNTER — Other Ambulatory Visit: Payer: Self-pay | Admitting: Family Medicine

## 2022-03-02 MED ORDER — ALLOPURINOL 300 MG PO TABS: ORAL_TABLET | ORAL | 0 refills | Status: DC

## 2022-03-02 NOTE — Addendum Note (Signed)
Addended by: Julious Payer D on: 03/02/2022 01:03 PM   Modules accepted: Orders

## 2022-03-02 NOTE — Telephone Encounter (Signed)
Refill failed. resent °

## 2022-03-11 LAB — COLOGUARD

## 2022-04-09 ENCOUNTER — Other Ambulatory Visit: Payer: Self-pay | Admitting: Family Medicine

## 2022-04-24 DIAGNOSIS — Z91148 Patient's other noncompliance with medication regimen for other reason: Secondary | ICD-10-CM

## 2022-04-24 NOTE — Progress Notes (Signed)
[facility])                                            [facility] Quality Pharmacy Team                                        Statin Quality Measure Assessment    04/24/2022  Ethan Peters [birth year] [id number]  Dr. Louanne Skye,   I am a [facility] clinical pharmacist that reviews patients for statin quality initiatives.     Per review of chart and payor information, patient has a diagnosis of diabetes and cardiovascular disease but is not currently filling a statin prescription.  This places patient into the SUPD (Statin Use In Patients with Diabetes) and SPC (Statin Use in Patients with Cardiovascular Disease) measures for CMS.    Patient has a prescription on file for atorvastatin 80mg , but it was last filled in September 2022 and the prescription expired in January 2023.     Component Value Date/Time   CHOL 199 01/18/2022 1116   TRIG 170 (H) 01/18/2022 1116   TRIG 77 06/01/2013 0841   HDL 38 (L) 01/18/2022 1116   HDL 44 06/01/2013 0841   CHOLHDL 5.2 (H) 01/18/2022 1116   CHOLHDL 2.9 06/20/2016 0923   VLDL 20 06/20/2016 0923   LDLCALC 130 (H) 01/18/2022 1116   LDLCALC 45 06/01/2013 0841    Please consider ONE of the following recommendations:  Renew/initiate high intensity statin Atorvastatin 80mg  once daily, #90, 3 refills   Rosuvastatin 20mg  once daily, #90, 3 refills    Initiate moderate intensity  statin with reduced frequency if prior  statin intolerance 1x weekly, #13, 3 refills   2x weekly, #26, 3 refills   3x weekly, #39, 3 refills    Code for past statin intolerance  (required annually)  Provider Requirements: Must associate code during an office visit or telehealth encounter   Drug Induced Myopathy G72.0   Myositis, unspecified M60.9   Myopathy, unspecified G72.9   Rhabdomyolysis  M62.82   Alcoholic cirrhosis of liver without ascites K70.30   Alcoholic cirrhosis of liver with ascites K70.31   Unspecified  cirrhosis of liver K74.60   Toxic liver disease with fibrosis and cirrhosis of liver K71.7      Please let 06/03/2013 know your decision.    Thank you!   , PharmD Clinical Pharmacist  [facility] [phone]

## 2022-04-25 ENCOUNTER — Ambulatory Visit (INDEPENDENT_AMBULATORY_CARE_PROVIDER_SITE_OTHER): Payer: PPO | Admitting: Family Medicine

## 2022-04-25 ENCOUNTER — Encounter: Payer: Self-pay | Admitting: Family Medicine

## 2022-04-25 VITALS — BP 136/82 | HR 55 | Temp 98.3°F | Ht 66.0 in | Wt 181.0 lb

## 2022-04-25 DIAGNOSIS — E875 Hyperkalemia: Secondary | ICD-10-CM | POA: Diagnosis not present

## 2022-04-25 DIAGNOSIS — E1159 Type 2 diabetes mellitus with other circulatory complications: Secondary | ICD-10-CM | POA: Diagnosis not present

## 2022-04-25 DIAGNOSIS — I25118 Atherosclerotic heart disease of native coronary artery with other forms of angina pectoris: Secondary | ICD-10-CM

## 2022-04-25 DIAGNOSIS — E11319 Type 2 diabetes mellitus with unspecified diabetic retinopathy without macular edema: Secondary | ICD-10-CM

## 2022-04-25 DIAGNOSIS — E1342 Other specified diabetes mellitus with diabetic polyneuropathy: Secondary | ICD-10-CM | POA: Diagnosis not present

## 2022-04-25 DIAGNOSIS — E039 Hypothyroidism, unspecified: Secondary | ICD-10-CM | POA: Diagnosis not present

## 2022-04-25 DIAGNOSIS — I10 Essential (primary) hypertension: Secondary | ICD-10-CM

## 2022-04-25 DIAGNOSIS — Z23 Encounter for immunization: Secondary | ICD-10-CM | POA: Diagnosis not present

## 2022-04-25 DIAGNOSIS — Z794 Long term (current) use of insulin: Secondary | ICD-10-CM | POA: Diagnosis not present

## 2022-04-25 DIAGNOSIS — E782 Mixed hyperlipidemia: Secondary | ICD-10-CM | POA: Diagnosis not present

## 2022-04-25 DIAGNOSIS — E1165 Type 2 diabetes mellitus with hyperglycemia: Secondary | ICD-10-CM | POA: Diagnosis not present

## 2022-04-25 LAB — BAYER DCA HB A1C WAIVED: HB A1C (BAYER DCA - WAIVED): 6.1 % — ABNORMAL HIGH (ref 4.8–5.6)

## 2022-04-25 MED ORDER — INSULIN ASPART PROT & ASPART (70-30 MIX) 100 UNIT/ML PEN
24.0000 [IU] | PEN_INJECTOR | Freq: Three times a day (TID) | SUBCUTANEOUS | 11 refills | Status: DC
Start: 2022-04-25 — End: 2022-05-02

## 2022-04-25 NOTE — Progress Notes (Signed)
BP 136/82   Pulse (!) 55   Temp 98.3 F (36.8 C)   Ht '5\' 6"'  (1.676 m)   Wt 181 lb (82.1 kg)   SpO2 97%   BMI 29.21 kg/m    Subjective:   Patient ID: Ethan Peters, male    DOB: 1956/08/31, 66 y.o.   MRN: [id number]  HPI: Ethan Peters is a 66 y.o. male presenting on 04/25/2022 for Medical Management of Chronic Issues, Diabetes, and Hyperlipidemia   HPI Type 2 diabetes mellitus Patient comes in today for recheck of his diabetes. Patient has been currently taking NovoLog 70/30, patient says it was changed when he went to the hospital.  Also metformin and glipizide.  He says he is getting occasional hypoglycemias down in the 40s and 60s.  Happened 3 times last month that he knows of and he did feel them. Patient is not currently on an ACE inhibitor/ARB. Patient has not seen an ophthalmologist this year. Patient denies any issues with their feet. The symptom started onset as an adult hypertension and hyperlipidemia and hypothyroidism and retinopathy and blindness ARE RELATED TO DM   Hypertension Patient is currently on candesartan, and their blood pressure today is 136/82. Patient denies any lightheadedness or dizziness. Patient denies headaches, blurred vision, chest pains, shortness of breath, or weakness. Denies any side effects from medication and is content with current medication.   Hyperlipidemia Patient is coming in for recheck of his hyperlipidemia. The patient is currently taking atorvastatin. They deny any issues with myalgias or history of liver damage from it. They deny any focal numbness or weakness or chest pain.   Hypothyroidism recheck Patient is coming in for thyroid recheck today as well. They deny any issues with hair changes or heat or cold problems or diarrhea or constipation. They deny any chest pain or palpitations. They are currently on levothyroxine 50 micrograms   Relevant past medical, surgical, family and social history reviewed and updated as  indicated. Interim medical history since our last visit reviewed. Allergies and medications reviewed and updated.  Review of Systems  Constitutional:  Negative for chills and fever.  Eyes:  Negative for visual disturbance.  Respiratory:  Negative for shortness of breath and wheezing.   Cardiovascular:  Negative for chest pain and leg swelling.  Musculoskeletal:  Negative for back pain and gait problem.  Skin:  Negative for rash.  Neurological:  Negative for dizziness, weakness and light-headedness.  All other systems reviewed and are negative.   Per HPI unless specifically indicated above   Allergies as of 04/25/2022       Reactions   Levemir [insulin Detemir] Swelling   Lisinopril Cough   Penicillins Rash   Lip swelling        Medication List        Accurate as of April 25, 2022 12:17 PM. If you have any questions, ask your nurse or doctor.          STOP taking these medications    insulin aspart protamine- aspart (70-30) 100 UNIT/ML injection Commonly known as: NovoLOG Mix 70/30 Replaced by: insulin aspart protamine - aspart (70-30) 100 UNIT/ML FlexPen Stopped by: Fransisca Kaufmann Hrishikesh Hoeg, MD       TAKE these medications    allopurinol 300 MG tablet Commonly known as: ZYLOPRIM TAKE ONE TABLET BY MOUTH ONCE DAILY AS NEEDED FOR GOUT PREVENTION   aspirin EC 81 MG tablet Take 1 tablet (81 mg total) by mouth daily.   atorvastatin 80 MG  tablet Commonly known as: LIPITOR TAKE 1 TABLET BY MOUTH ONCE DAILY AT 6 PM FOR CHOLESTEROL What changed:  how much to take how to take this when to take this additional instructions   B-complex with vitamin C tablet Take 1 tablet by mouth as needed (for vitamin deficiency).   brimonidine 0.2 % ophthalmic solution Commonly known as: ALPHAGAN Place 1 drop into both eyes 3 (three) times daily. Give 90-day supply at a time   candesartan 16 MG tablet Commonly known as: ATACAND Take 1 tablet (16 mg total) by mouth in the  morning and at bedtime. What changed:  when to take this reasons to take this   cholecalciferol 25 MCG (1000 UNIT) tablet Commonly known as: VITAMIN D3 Take 1,000 Units by mouth daily.   dorzolamide-timolol 22.3-6.8 MG/ML ophthalmic solution Commonly known as: COSOPT 1 DROP BOTH EYES TWICE A DAY. SEPARATE BY AT LEAST 10 MINUTES FROM OTHER PRESSURE REDUCING EYE DROPS What changed: See the new instructions.   glipiZIDE 5 MG tablet Commonly known as: GLUCOTROL Take 1 tablet (5 mg total) by mouth daily before breakfast.   insulin aspart protamine - aspart (70-30) 100 UNIT/ML FlexPen Commonly known as: NOVOLOG 70/30 MIX Inject 24-30 Units into the skin 3 (three) times daily before meals. Replaces: insulin aspart protamine- aspart (70-30) 100 UNIT/ML injection Started by: Fransisca Kaufmann Kiante Petrovich, MD   latanoprost 0.005 % ophthalmic solution Commonly known as: XALATAN INSTILL 1 DROP IN BOTH EYES EVERY EVENING What changed: See the new instructions.   levothyroxine 50 MCG tablet Commonly known as: SYNTHROID Take 1 tablet (50 mcg total) by mouth daily.   metFORMIN 1000 MG tablet Commonly known as: GLUCOPHAGE Take 1 tablet (1,000 mg total) by mouth 2 (two) times daily with a meal.   metoCLOPramide 5 MG tablet Commonly known as: REGLAN Take 1 tablet (5 mg total) by mouth 3 (three) times daily before meals.   nitroGLYCERIN 0.4 MG SL tablet Commonly known as: NITROSTAT Place 1 tablet (0.4 mg total) under the tongue every 5 (five) minutes x 3 doses as needed for chest pain.   OneTouch Verio test strip Generic drug: glucose blood Test BS 4 times daily Dx K81.27   tamsulosin 0.4 MG Caps capsule Commonly known as: FLOMAX Take 0.4 mg by mouth daily.         Objective:   BP 136/82   Pulse (!) 55   Temp 98.3 F (36.8 C)   Ht '5\' 6"'  (1.676 m)   Wt 181 lb (82.1 kg)   SpO2 97%   BMI 29.21 kg/m   Wt Readings from Last 3 Encounters:  04/25/22 181 lb (82.1 kg)  02/05/22 164 lb  6.4 oz (74.6 kg)  01/31/22 157 lb 12.8 oz (71.6 kg)    Physical Exam Vitals and nursing note reviewed.  Constitutional:      General: He is not in acute distress.    Appearance: He is well-developed. He is not diaphoretic.  Eyes:     General: No scleral icterus.    Conjunctiva/sclera: Conjunctivae normal.  Neck:     Thyroid: No thyromegaly.  Cardiovascular:     Rate and Rhythm: Normal rate and regular rhythm.     Heart sounds: Normal heart sounds. No murmur heard. Pulmonary:     Effort: Pulmonary effort is normal. No respiratory distress.     Breath sounds: Normal breath sounds. No wheezing.  Musculoskeletal:        General: No swelling. Normal range of motion.  Cervical back: Neck supple.  Lymphadenopathy:     Cervical: No cervical adenopathy.  Skin:    General: Skin is warm and dry.     Findings: No rash.  Neurological:     Mental Status: He is alert and oriented to person, place, and time.     Coordination: Coordination normal.  Psychiatric:        Behavior: Behavior normal.       Assessment & Plan:   Problem List Items Addressed This Visit       Cardiovascular and Mediastinum   DM type 2 causing vascular disease ([place])   Relevant Medications   insulin aspart protamine - aspart (NOVOLOG 70/30 MIX) (70-30) 100 UNIT/ML FlexPen   Essential hypertension   CAD (coronary artery disease), native coronary artery     Endocrine   Diabetic neuropathy (HCC) - Primary   Relevant Medications   insulin aspart protamine - aspart (NOVOLOG 70/30 MIX) (70-30) 100 UNIT/ML FlexPen   Other Relevant Orders   Bayer DCA Hb A1c Waived   BMP8+EGFR   Hypothyroidism   Diabetic retinopathy of both eyes associated with type 2 diabetes mellitus (HCC)   Relevant Medications   insulin aspart protamine - aspart (NOVOLOG 70/30 MIX) (70-30) 100 UNIT/ML FlexPen     Other   Mixed hyperlipidemia   Other Visit Diagnoses     Serum potassium elevated           Blood sugar looks good  at 6.1 of an A1c.  He is having occasional hypoglycemia so recommended that he decrease his evening dose, he is currently taking 24, 30 and 30, recommended that he decrease the evening dose down to 28 to reduce the hypoglycemias in the morning. Follow up plan: Return in about 3 months (around 07/26/2022), or if symptoms worsen or fail to improve, for Diabetes and thyroid recheck.  Counseling provided for all of the vaccine components Orders Placed This Encounter  Procedures   Bayer [facility] Hb A1c Waived   [place], MD [facility] 04/25/2022, 12:17 PM

## 2022-04-25 NOTE — Addendum Note (Signed)
Addended by: Dorene Sorrow on: 04/25/2022 01:28 PM   Modules accepted: Orders

## 2022-04-26 ENCOUNTER — Other Ambulatory Visit: Payer: Self-pay | Admitting: Family Medicine

## 2022-04-26 LAB — BMP8+EGFR
BUN/Creatinine Ratio: 17 (ref 10–24)
BUN: 25 mg/dL (ref 8–27)
CO2: 25 mmol/L (ref 20–29)
Calcium: 9.4 mg/dL (ref 8.6–10.2)
Chloride: 105 mmol/L (ref 96–106)
Creatinine, Ser: 1.47 mg/dL — ABNORMAL HIGH (ref 0.76–1.27)
Glucose: 130 mg/dL — ABNORMAL HIGH (ref 70–99)
Potassium: 4.9 mmol/L (ref 3.5–5.2)
Sodium: 142 mmol/L (ref 134–144)
eGFR: 52 mL/min/{1.73_m2} — ABNORMAL LOW (ref >59)

## 2022-05-02 ENCOUNTER — Telehealth: Payer: Self-pay | Admitting: Family Medicine

## 2022-05-02 ENCOUNTER — Other Ambulatory Visit: Payer: Self-pay | Admitting: *Deleted

## 2022-05-02 ENCOUNTER — Other Ambulatory Visit: Payer: Self-pay | Admitting: Family Medicine

## 2022-05-02 DIAGNOSIS — E1159 Type 2 diabetes mellitus with other circulatory complications: Secondary | ICD-10-CM

## 2022-05-02 MED ORDER — INSULIN ASPART PROT & ASPART (70-30 MIX) 100 UNIT/ML ~~LOC~~ SUSP: 24.0000 [IU] | Freq: Three times a day (TID) | SUBCUTANEOUS | 11 refills | Status: DC

## 2022-05-02 MED ORDER — INSULIN ASPART 100 UNIT/ML IJ SOLN: 20.0000 [IU] | Freq: Three times a day (TID) | INTRAMUSCULAR | 3 refills | Status: DC

## 2022-05-02 NOTE — Telephone Encounter (Signed)
Pt informed

## 2022-05-02 NOTE — Telephone Encounter (Signed)
Sent in prescription for NovoLog 70/30 vials

## 2022-05-02 NOTE — Telephone Encounter (Signed)
Pt is wanting vials of Novolog instead of the flexpens.  Requests 800mg  of IBU as well

## 2022-05-02 NOTE — Progress Notes (Unsigned)
Sent NovoLog for the patient.

## 2022-05-03 ENCOUNTER — Other Ambulatory Visit: Payer: Self-pay | Admitting: Family Medicine

## 2022-05-03 DIAGNOSIS — E1159 Type 2 diabetes mellitus with other circulatory complications: Secondary | ICD-10-CM

## 2022-05-03 MED ORDER — INSULIN ASPART 100 UNIT/ML IJ SOLN: 20.0000 [IU] | Freq: Three times a day (TID) | INTRAMUSCULAR | 3 refills | Status: DC

## 2022-05-03 NOTE — Progress Notes (Unsigned)
Sent prescription for more of the insulin, it was not sufficient for a month supply before

## 2022-05-29 ENCOUNTER — Telehealth: Payer: Self-pay | Admitting: *Deleted

## 2022-05-29 DIAGNOSIS — M109 Gout, unspecified: Secondary | ICD-10-CM

## 2022-05-29 NOTE — Telephone Encounter (Signed)
Came into office request for RF on Ibuprofen for gout Has not been on med list since 04/20/21 Cardiology appt, she stated it was taken off since he completed it Please advise on Rf

## 2022-05-30 MED ORDER — IBUPROFEN 600 MG PO TABS: 600.0000 mg | ORAL_TABLET | Freq: Two times a day (BID) | ORAL | 1 refills | Status: DC | PRN

## 2022-05-30 NOTE — Addendum Note (Signed)
Addended by: Arville Care on: 05/30/2022 07:53 AM   Modules accepted: Orders

## 2022-05-30 NOTE — Telephone Encounter (Signed)
Sent in a small prescription of ibuprofen, make sure he stays hydrated and use only when needed, make sure he takes with food as well.

## 2022-05-31 NOTE — Telephone Encounter (Signed)
Son made aware. No further concerns.

## 2022-06-01 NOTE — Progress Notes (Unsigned)
Eber Jones Date of Birth: [birth year] Medical Record #[id number]  History of Present Illness: [name] is seen for followup of CAD. He is status post inferior STEMI on 01/09/2013. He had stenting of the RCA at the crux. He had persistent chest pain and dyspnea even after his infarct with atypical symptoms. He subsequently underwent repeat cardiac catheterization on April 10,2014 which showed excellent patency of the stent in the RCA. He does have a long 70% stenosis in the left circumflex,  treated medically. He had a Myovew study in September 2017 which showed an inferior scar without ischemia. EF was 48%.  He has a history of poorly controlled diabetes mellitus with retinopathy and neuropathy.   He was seen in the ED in August 2021with progressive left eye blindness. MRI done showed an incidental chronic SDH. Evaluated by Neurosurgery and no further therapy needed.   He was admitted in April this year. Had returned from a trip to [place]. Had abdominal pain and nausea. Found to have PNA, UTI and DKA. Also had AKI. Responded to antibiotics, insulin, and hydration.   On follow up today he is doing well. He is seen with his wife and son.  He does note a slight pain in his chest at times. Not exertional. Hasn't had to use Ntg. States he quit taking ASA because he thought this caused burning on urination. His atacand was discontinued due to hyperkalemia. Lipids were quite a bit higher in the hospital in April. Not sure he was taking his lipitor regularly but he is now. Son thinks he is drinking too much. He states 4 times a year he will drink Tequila- several cups a day.   Current Outpatient Medications on File Prior to Visit  Medication Sig Dispense Refill   allopurinol (ZYLOPRIM) 300 MG tablet TAKE ONE TABLET BY MOUTH ONCE DAILY AS NEEDED FOR GOUT PREVENTION 90 tablet 0   aspirin EC 81 MG EC tablet Take 1 tablet (81 mg total) by mouth daily.     atorvastatin (LIPITOR) 80 MG tablet TAKE 1  TABLET BY MOUTH ONCE DAILY AT 6 PM FOR CHOLESTEROL (Patient taking differently: Take 80 mg by mouth daily.) 90 tablet 3   B Complex-C (B-COMPLEX WITH VITAMIN C) tablet Take 1 tablet by mouth as needed (for vitamin deficiency).     brimonidine (ALPHAGAN) 0.2 % ophthalmic solution PLACE 1 DROP INTO BOTH EYES 3 (THREE) TIMES DAILY. GIVE 90-DAY SUPPLY AT A TIME 30 mL 1   cholecalciferol (VITAMIN D3) 25 MCG (1000 UNIT) tablet Take 1,000 Units by mouth daily.     dorzolamide-timolol (COSOPT) 22.3-6.8 MG/ML ophthalmic solution 1 DROP BOTH EYES TWICE A DAY. SEPARATE BY AT LEAST 10 MINUTES FROM OTHER PRESSURE REDUCING EYE DROPS (Patient taking differently: Place 1 drop into both eyes 2 (two) times daily.) 20 mL 5   glipiZIDE (GLUCOTROL) 5 MG tablet Take 1 tablet (5 mg total) by mouth daily before breakfast. 90 tablet 3   glucose blood (ONETOUCH VERIO) test strip Test BS 4 times daily Dx E13.42 400 strip 3   ibuprofen (ADVIL) 600 MG tablet Take 1 tablet (600 mg total) by mouth 2 (two) times daily as needed. 60 tablet 1   insulin aspart (NOVOLOG) 100 UNIT/ML injection Inject 20-30 Units into the skin 3 (three) times daily before meals. 81 mL 3   insulin aspart protamine- aspart (NOVOLOG MIX 70/30) (70-30) 100 UNIT/ML injection Inject 0.24-0.3 mLs (24-30 Units total) into the skin 3 (three) times daily with meals. 30  mL 11   latanoprost (XALATAN) 0.005 % ophthalmic solution INSTILL 1 DROP IN BOTH EYES EVERY EVENING (Patient taking differently: Place 1 drop into both eyes at bedtime.) 7.5 mL 3   levothyroxine (SYNTHROID) 50 MCG tablet Take 1 tablet (50 mcg total) by mouth daily. 90 tablet 3   metFORMIN (GLUCOPHAGE) 1000 MG tablet Take 1 tablet (1,000 mg total) by mouth 2 (two) times daily with a meal. 180 tablet 3   metoCLOPramide (REGLAN) 5 MG tablet Take 1 tablet (5 mg total) by mouth 3 (three) times daily before meals. 90 tablet 1   nitroGLYCERIN (NITROSTAT) 0.4 MG SL tablet Place 1 tablet (0.4 mg total) under  the tongue every 5 (five) minutes x 3 doses as needed for chest pain. 75 tablet 3   tamsulosin (FLOMAX) 0.4 MG CAPS capsule Take 0.4 mg by mouth daily.     No current facility-administered medications on file prior to visit.    Allergies  Allergen Reactions   Levemir [Insulin Detemir] Swelling   Lisinopril Cough   Penicillins Rash    Lip swelling    Past Medical History:  Diagnosis Date   CAD (coronary artery disease) 01/09/13   Inferior STEMI s/p DES-RCA   Cataract    Diabetes mellitus    Uncontrolled, Hgb A1C 14.8% on 03/14, started on insulin   Gout    Hyperlipidemia    Hypertension    Ischemic cardiomyopathy    EF 45-50%, grade 1 diastolic dysfunction, mildly dilated RV, RA at the upper limits of normal, mild TR, PA systolic pressure 32 mm mercury and hypokinesis to akinesis of the basal mid inferior myocardium   Myocardial infarction (HCC) 01/09/2013   Shortness of breath    Thyroid disease     Past Surgical History:  Procedure Laterality Date   CARDIAC CATHETERIZATION  01/22/2013   Diffuse borderline residual CAD consistent with uncontrolled diabetes, medical management recommended   CORONARY ANGIOPLASTY WITH STENT PLACEMENT  01/09/2013   30% pLAD, 30% mLAD, 70% pLCx, RCA occlusion at crux with R->L distal collaterals s/p DES; LVEF 50%, moderate-severe HK of inferior basal wall   EYE SURGERY Bilateral    LEFT HEART CATHETERIZATION WITH CORONARY ANGIOGRAM N/A 01/09/2013   Procedure: LEFT HEART CATHETERIZATION WITH CORONARY ANGIOGRAM;  Surgeon: Joshawa Dubin M [place], MD;  Location: [facility] CATH LAB;  Service: Cardiovascular;  Laterality: N/A;   LEFT HEART CATHETERIZATION WITH CORONARY ANGIOGRAM N/A 01/22/2013   Procedure: LEFT HEART CATHETERIZATION WITH CORONARY ANGIOGRAM;  Surgeon: Tonny Bollman, MD;  Location: [facility] CATH LAB;  Service: Cardiovascular;  Laterality: N/A;    Social History   Tobacco Use  Smoking Status Never  Smokeless Tobacco Never    Social History   Substance  and Sexual Activity  Alcohol Use Yes   Comment: 4 times per year-wine or beer    Family History  Problem Relation Age of Onset   Heart disease Mother    Heart disease Father    Heart disease Brother    Heart disease Brother    Colon cancer Neg Hx    Colon polyps Neg Hx     Review of Systems: As noted in history of present illness.  All other systems were reviewed and are negative.  Physical Exam: BP 130/70   Pulse (!) 56   Ht 5\' 6"  (1.676 m)   Wt 186 lb 12.8 oz (84.7 kg)   SpO2 98%   BMI 30.15 kg/m  GENERAL:  Well appearing male, overweight  in NAD HEENT:  PERRL,  EOMI, sclera are clear. Oropharynx is clear. NECK:  No jugular venous distention, carotid upstroke brisk and symmetric, no bruits, no thyromegaly or adenopathy LUNGS:  Clear to auscultation bilaterally CHEST:  Unremarkable HEART:  RRR,  PMI not displaced or sustained,S1 and S2 within normal limits, no S3, no S4: no clicks, no rubs, no murmurs ABD:  Soft, nontender. BS +, no masses or bruits. No hepatomegaly, no splenomegaly EXT:  2 + pulses throughout, no edema, no cyanosis no clubbing SKIN:  Warm and dry.  No rashes NEURO:  Alert and oriented x 3. Cranial nerves II through XII intact. PSYCH:  Cognitively intact      LABORATORY DATA: Lab Results  Component Value Date   WBC 9.0 02/05/2022   HGB 13.1 02/05/2022   HCT 39.5 02/05/2022   PLT 546 (H) 02/05/2022   GLUCOSE 130 (H) 04/25/2022   CHOL 199 01/18/2022   TRIG 170 (H) 01/18/2022   HDL 38 (L) 01/18/2022   LDLCALC 130 (H) 01/18/2022   ALT 33 02/05/2022   AST 24 02/05/2022   NA 142 04/25/2022   K 4.9 04/25/2022   CL 105 04/25/2022   CREATININE 1.47 (H) 04/25/2022   BUN 25 04/25/2022   CO2 25 04/25/2022   TSH 1.650 01/18/2022   PSA 0.6 09/22/2014   INR 1.16 01/21/2013   HGBA1C 6.1 (H) 04/25/2022   MICROALBUR neg 11/02/2014   Ecg today shows NSR rate 56. Old inferior infarct. No change. I have personally reviewed and interpreted this  study.  Myoview 06/26/16:Study Highlights     The left ventricular ejection fraction is mildly decreased (45-54%). Nuclear stress EF: 48%. Blood pressure demonstrated a hypertensive response to exercise. There was no ST segment deviation noted during stress. Findings consistent with prior myocardial infarction. This is an intermediate risk study.   Small inferobasal wall infarct no ischemia EF 48%    Myoview 11/03/20: Study Highlights    Nuclear stress EF: 53%. No wall motion abnormalities. The left ventricular ejection fraction is mildly decreased (45-54%). There was no ST segment deviation noted during stress. Defect 1: There is a medium defect of moderate severity present in the basal inferior and mid inferior location. Findings consistent with prior myocardial infarction inferior wall. There is no significant ischemia identified. This is an intermediate risk study.   Donato Schultz, MD  Assessment / Plan: 1. Coronary disease status post inferior STEMI 2014 treated with DES to the RCA. Repeat cardiac catheterization in 2014 demonstrated continued patency. Moderate diffuse disease in the left circumflex. No ischemia by [facility] September 2017.  Myoview in January 2022 unchanged.    He is having some atypical chest pain that he has had for years.  We will continue aspirin and statin.   2. Diabetes mellitus: On metformin, glipizide, and insulin . Followed by primary care. Last A1c 6.1%.   3. Dyslipidemia. On statin therapy. Lipids in April were high. Will repeat now that he reports taking lipitor regularly. Encourage moderation in [place] use as this affects his triglycerides and sugar.   4. Hypertension, BP good control on current medications. ARB stopped due to hyperkalemia  5. Diabetic retinopathy and neuropathy. Now legally blind.   6. Chronic subdural Hematoma. Seen by Neurosurgery. No additional therapy needed.   Follow up in 6 months.

## 2022-06-04 DIAGNOSIS — H4053X3 Glaucoma secondary to other eye disorders, bilateral, severe stage: Secondary | ICD-10-CM | POA: Diagnosis not present

## 2022-06-04 DIAGNOSIS — Z7984 Long term (current) use of oral hypoglycemic drugs: Secondary | ICD-10-CM | POA: Diagnosis not present

## 2022-06-04 DIAGNOSIS — Z961 Presence of intraocular lens: Secondary | ICD-10-CM | POA: Diagnosis not present

## 2022-06-04 DIAGNOSIS — E113553 Type 2 diabetes mellitus with stable proliferative diabetic retinopathy, bilateral: Secondary | ICD-10-CM | POA: Diagnosis not present

## 2022-06-04 LAB — HM DIABETES EYE EXAM

## 2022-06-06 ENCOUNTER — Encounter: Payer: Self-pay | Admitting: Cardiology

## 2022-06-06 ENCOUNTER — Ambulatory Visit (INDEPENDENT_AMBULATORY_CARE_PROVIDER_SITE_OTHER): Payer: PPO | Admitting: Cardiology

## 2022-06-06 VITALS — BP 130/70 | HR 56 | Ht 66.0 in | Wt 186.8 lb

## 2022-06-06 DIAGNOSIS — E782 Mixed hyperlipidemia: Secondary | ICD-10-CM

## 2022-06-06 DIAGNOSIS — I25118 Atherosclerotic heart disease of native coronary artery with other forms of angina pectoris: Secondary | ICD-10-CM | POA: Diagnosis not present

## 2022-06-06 DIAGNOSIS — I10 Essential (primary) hypertension: Secondary | ICD-10-CM | POA: Diagnosis not present

## 2022-06-06 LAB — LIPID PANEL
Chol/HDL Ratio: 4 ratio (ref 0.0–5.0)
Cholesterol, Total: 186 mg/dL (ref 100–199)
HDL: 46 mg/dL (ref >39)
LDL Chol Calc (NIH): 107 mg/dL — ABNORMAL HIGH (ref 0–99)
Triglycerides: 189 mg/dL — ABNORMAL HIGH (ref 0–149)
VLDL Cholesterol Cal: 33 mg/dL (ref 5–40)

## 2022-06-06 NOTE — Patient Instructions (Signed)
We will check your cholesterol today  Continue taking ASA 81 mg daily.

## 2022-06-10 ENCOUNTER — Other Ambulatory Visit: Payer: Self-pay | Admitting: Family Medicine

## 2022-06-11 ENCOUNTER — Other Ambulatory Visit: Payer: Self-pay

## 2022-06-11 DIAGNOSIS — E782 Mixed hyperlipidemia: Secondary | ICD-10-CM

## 2022-06-11 DIAGNOSIS — I25118 Atherosclerotic heart disease of native coronary artery with other forms of angina pectoris: Secondary | ICD-10-CM

## 2022-06-11 DIAGNOSIS — N5201 Erectile dysfunction due to arterial insufficiency: Secondary | ICD-10-CM | POA: Diagnosis not present

## 2022-06-11 MED ORDER — EZETIMIBE 10 MG PO TABS: 10.0000 mg | ORAL_TABLET | Freq: Every day | ORAL | 3 refills | Status: DC

## 2022-06-11 NOTE — Progress Notes (Signed)
Zetia

## 2022-07-02 ENCOUNTER — Other Ambulatory Visit: Payer: Self-pay | Admitting: Family Medicine

## 2022-07-26 ENCOUNTER — Ambulatory Visit (INDEPENDENT_AMBULATORY_CARE_PROVIDER_SITE_OTHER): Payer: PPO | Admitting: Family Medicine

## 2022-07-26 ENCOUNTER — Encounter: Payer: Self-pay | Admitting: Family Medicine

## 2022-07-26 VITALS — BP 94/59 | HR 53 | Temp 97.0°F | Ht 66.0 in | Wt 186.0 lb

## 2022-07-26 DIAGNOSIS — F5104 Psychophysiologic insomnia: Secondary | ICD-10-CM | POA: Diagnosis not present

## 2022-07-26 DIAGNOSIS — I10 Essential (primary) hypertension: Secondary | ICD-10-CM

## 2022-07-26 DIAGNOSIS — E1342 Other specified diabetes mellitus with diabetic polyneuropathy: Secondary | ICD-10-CM | POA: Diagnosis not present

## 2022-07-26 DIAGNOSIS — E782 Mixed hyperlipidemia: Secondary | ICD-10-CM

## 2022-07-26 DIAGNOSIS — E1159 Type 2 diabetes mellitus with other circulatory complications: Secondary | ICD-10-CM

## 2022-07-26 DIAGNOSIS — E039 Hypothyroidism, unspecified: Secondary | ICD-10-CM | POA: Diagnosis not present

## 2022-07-26 DIAGNOSIS — E11319 Type 2 diabetes mellitus with unspecified diabetic retinopathy without macular edema: Secondary | ICD-10-CM

## 2022-07-26 DIAGNOSIS — Z23 Encounter for immunization: Secondary | ICD-10-CM | POA: Diagnosis not present

## 2022-07-26 DIAGNOSIS — M109 Gout, unspecified: Secondary | ICD-10-CM | POA: Diagnosis not present

## 2022-07-26 LAB — BAYER DCA HB A1C WAIVED: HB A1C (BAYER DCA - WAIVED): 6.9 % — ABNORMAL HIGH (ref 4.8–5.6)

## 2022-07-26 MED ORDER — MIRTAZAPINE 15 MG PO TBDP: 15.0000 mg | ORAL_TABLET | Freq: Every day | ORAL | 1 refills | Status: DC

## 2022-07-26 MED ORDER — GLIPIZIDE ER 2.5 MG PO TB24: 2.5000 mg | ORAL_TABLET | Freq: Two times a day (BID) | ORAL | 3 refills | Status: DC

## 2022-07-26 MED ORDER — IBUPROFEN 800 MG PO TABS: 800.0000 mg | ORAL_TABLET | Freq: Two times a day (BID) | ORAL | 3 refills | Status: DC | PRN

## 2022-07-26 NOTE — Progress Notes (Signed)
BP (!) 94/59   Pulse (!) 53   Temp (!) 97 F (36.1 C)   Ht '5\' 6"'  (1.676 m)   Wt 186 lb (84.4 kg)   SpO2 100%   BMI 30.02 kg/m    Subjective:   Patient ID: Ethan Peters, male    DOB: Oct 12, 1956, 66 y.o.   MRN: [id number]  HPI: DAAIEL STARLIN is a 66 y.o. male presenting on 07/26/2022 for Medical Management of Chronic Issues, Hyperlipidemia, Hypothyroidism, and Diabetes   HPI Insomnia Patient is coming with complaints of insomnia and never tried the melatonin.  He says he used to take Valium and Ativan many many years ago but not recently and they did help.  He did try trazodone once and had a severe reaction to it as it does not want to try that.  Type 2 diabetes mellitus Patient comes in today for recheck of his diabetes. Patient has been currently taking glipizide NovoLog 70/30 and metformin, patient wants to cut the glipizide in half and take half in the morning half in the evening.. Patient is not currently on an ACE inhibitor/ARB. Patient has not seen an ophthalmologist this year because he is already legally blind. Patient denies any issues with their feet. The symptom started onset as an adult hypertension and hyperlipidemia and hypothyroidism and CAD ARE RELATED TO DM   Hypertension Patient is currently on no medication currently, and their blood pressure today is 94/59. Patient occasional lightheadedness or dizziness over the past 2 days, his blood pressure was good before then, will increase hydration. Patient denies headaches, blurred vision, chest pains, shortness of breath, or weakness. Denies any side effects from medication and is content with current medication.   Hyperlipidemia Patient is coming in for recheck of his hyperlipidemia. The patient is currently taking Zetia and atorvastatin. They deny any issues with myalgias or history of liver damage from it. They deny any focal numbness or weakness or chest pain.   Hypothyroidism recheck Patient is coming in  for thyroid recheck today as well. They deny any issues with hair changes or heat or cold problems or diarrhea or constipation. They deny any chest pain or palpitations. They are currently on levothyroxine 50 micrograms   Relevant past medical, surgical, family and social history reviewed and updated as indicated. Interim medical history since our last visit reviewed. Allergies and medications reviewed and updated.  Review of Systems  Constitutional:  Negative for chills and fever.  Eyes:  Positive for visual disturbance.  Respiratory:  Negative for shortness of breath and wheezing.   Cardiovascular:  Negative for chest pain and leg swelling.  Musculoskeletal:  Negative for back pain and gait problem.  Skin:  Negative for rash.  Neurological:  Positive for light-headedness.  Psychiatric/Behavioral:  Positive for sleep disturbance. The patient is not nervous/anxious.   All other systems reviewed and are negative.   Per HPI unless specifically indicated above   Allergies as of 07/26/2022       Reactions   Levemir [insulin Detemir] Swelling   Lisinopril Cough   Penicillins Rash   Lip swelling        Medication List        Accurate as of July 26, 2022 11:03 AM. If you have any questions, ask your nurse or doctor.          STOP taking these medications    glipiZIDE 5 MG tablet Commonly known as: GLUCOTROL Replaced by: glipiZIDE 2.5 MG 24 hr tablet Stopped  by: Worthy Rancher, MD       TAKE these medications    allopurinol 300 MG tablet Commonly known as: ZYLOPRIM TAKE ONE TABLET BY MOUTH ONCE DAILY AS NEEDED FOR GOUT PREVENTION   aspirin EC 81 MG tablet Take 1 tablet (81 mg total) by mouth daily.   atorvastatin 80 MG tablet Commonly known as: LIPITOR TAKE 1 TABLET BY MOUTH ONCE DAILY AT 6 PM FOR CHOLESTEROL   B-complex with vitamin C tablet Take 1 tablet by mouth as needed (for vitamin deficiency).   brimonidine 0.2 % ophthalmic solution Commonly  known as: ALPHAGAN PLACE 1 DROP INTO BOTH EYES 3 (THREE) TIMES DAILY. GIVE 90-DAY SUPPLY AT A TIME   cholecalciferol 25 MCG (1000 UNIT) tablet Commonly known as: VITAMIN D3 Take 1,000 Units by mouth daily.   dorzolamide-timolol 22.3-6.8 MG/ML ophthalmic solution Commonly known as: COSOPT 1 DROP BOTH EYES TWICE A DAY. SEPARATE BY AT LEAST 10 MINUTES FROM OTHER PRESSURE REDUCING EYE DROPS What changed: See the new instructions.   ezetimibe 10 MG tablet Commonly known as: ZETIA Take 1 tablet (10 mg total) by mouth daily.   glipiZIDE 2.5 MG 24 hr tablet Commonly known as: glipiZIDE XL Take 1 tablet (2.5 mg total) by mouth 2 (two) times daily. Replaces: glipiZIDE 5 MG tablet Started by: Fransisca Kaufmann Rutha Melgoza, MD   ibuprofen 800 MG tablet Commonly known as: ADVIL Take 1 tablet (800 mg total) by mouth 2 (two) times daily as needed. What changed:  medication strength how much to take Changed by: Fransisca Kaufmann Sam Wunschel, MD   insulin aspart 100 UNIT/ML injection Commonly known as: novoLOG Inject 20-30 Units into the skin 3 (three) times daily before meals.   insulin aspart protamine- aspart (70-30) 100 UNIT/ML injection Commonly known as: NovoLOG Mix 70/30 Inject 0.24-0.3 mLs (24-30 Units total) into the skin 3 (three) times daily with meals.   latanoprost 0.005 % ophthalmic solution Commonly known as: XALATAN INSTILL 1 DROP IN BOTH EYES EVERY EVENING What changed: See the new instructions.   levothyroxine 50 MCG tablet Commonly known as: SYNTHROID Take 1 tablet (50 mcg total) by mouth daily.   metFORMIN 1000 MG tablet Commonly known as: GLUCOPHAGE Take 1 tablet (1,000 mg total) by mouth 2 (two) times daily with a meal.   metoCLOPramide 5 MG tablet Commonly known as: REGLAN Take 1 tablet (5 mg total) by mouth 3 (three) times daily before meals.   mirtazapine 15 MG disintegrating tablet Commonly known as: REMERON SOL-TAB Take 1 tablet (15 mg total) by mouth at bedtime. Started  by: Worthy Rancher, MD   nitroGLYCERIN 0.4 MG SL tablet Commonly known as: NITROSTAT Place 1 tablet (0.4 mg total) under the tongue every 5 (five) minutes x 3 doses as needed for chest pain.   OneTouch Verio test strip Generic drug: glucose blood Test BS 4 times daily Dx W80.88   tamsulosin 0.4 MG Caps capsule Commonly known as: FLOMAX Take 0.4 mg by mouth daily.         Objective:   BP (!) 94/59   Pulse (!) 53   Temp (!) 97 F (36.1 C)   Ht '5\' 6"'  (1.676 m)   Wt 186 lb (84.4 kg)   SpO2 100%   BMI 30.02 kg/m   Wt Readings from Last 3 Encounters:  07/26/22 186 lb (84.4 kg)  06/06/22 186 lb 12.8 oz (84.7 kg)  04/25/22 181 lb (82.1 kg)    Physical Exam Vitals and nursing note reviewed.  Constitutional:      General: He is not in acute distress.    Appearance: He is well-developed. He is not diaphoretic.  Eyes:     General: No scleral icterus.    Conjunctiva/sclera: Conjunctivae normal.  Neck:     Thyroid: No thyromegaly.  Cardiovascular:     Rate and Rhythm: Normal rate and regular rhythm.     Heart sounds: Normal heart sounds. No murmur heard. Pulmonary:     Effort: Pulmonary effort is normal. No respiratory distress.     Breath sounds: Normal breath sounds. No wheezing.  Musculoskeletal:        General: Normal range of motion.     Cervical back: Neck supple.  Lymphadenopathy:     Cervical: No cervical adenopathy.  Skin:    General: Skin is warm and dry.     Findings: No rash.  Neurological:     Mental Status: He is alert and oriented to person, place, and time.     Coordination: Coordination normal.  Psychiatric:        Behavior: Behavior normal.       Assessment & Plan:   Problem List Items Addressed This Visit       Cardiovascular and Mediastinum   DM type 2 causing vascular disease (HCC)   Relevant Medications   glipiZIDE (GLIPIZIDE XL) 2.5 MG 24 hr tablet   Other Relevant Orders   CBC with Differential/Platelet   CMP14+EGFR    Lipid panel   TSH   Bayer DCA Hb A1c Waived   Microalbumin / creatinine urine ratio   Essential hypertension - Primary   Relevant Orders   CBC with Differential/Platelet   CMP14+EGFR   Lipid panel   TSH   Bayer DCA Hb A1c Waived     Endocrine   Diabetic neuropathy (HCC)   Relevant Medications   glipiZIDE (GLIPIZIDE XL) 2.5 MG 24 hr tablet   Hypothyroidism   Relevant Orders   CBC with Differential/Platelet   CMP14+EGFR   Lipid panel   TSH   Bayer DCA Hb A1c Waived   Diabetic retinopathy of both eyes associated with type 2 diabetes mellitus (HCC)   Relevant Medications   glipiZIDE (GLIPIZIDE XL) 2.5 MG 24 hr tablet     Other   Mixed hyperlipidemia   Relevant Orders   CBC with Differential/Platelet   CMP14+EGFR   Lipid panel   TSH   Bayer DCA Hb A1c Waived   Gout   Relevant Medications   ibuprofen (ADVIL) 800 MG tablet   Other Visit Diagnoses     Psychophysiological insomnia       Relevant Medications   mirtazapine (REMERON SOL-TAB) 15 MG disintegrating tablet       A1c looks good at 6.9. Switch to glipizide 2.5 mg twice a day, will try mirtazapine for sleep aid  No other changes, he will monitor his blood pressure at home and if continues to run low will let us know.  Follow up plan: Return in about 4 months (around 11/26/2022), or if symptoms worsen or fail to improve, for Appointment for whenever he is back from his trip to [place], diabetes.  Counseling provided for all of the vaccine components Orders Placed This Encounter  Procedures   CBC with Differential/Platelet   CMP14+EGFR   Lipid panel   TSH   Bayer DCA Hb A1c Waived   Microalbumin / creatinine urine ratio    Caryl Pina, MD [facility] 07/26/2022, 11:03 AM

## 2022-07-27 LAB — CMP14+EGFR
ALT: 11 IU/L (ref 0–44)
AST: 11 IU/L (ref 0–40)
Albumin/Globulin Ratio: 1.7 (ref 1.2–2.2)
Albumin: 4.4 g/dL (ref 3.9–4.9)
Alkaline Phosphatase: 73 IU/L (ref 44–121)
BUN/Creatinine Ratio: 11 (ref 10–24)
BUN: 15 mg/dL (ref 8–27)
Bilirubin Total: 0.8 mg/dL (ref 0.0–1.2)
CO2: 22 mmol/L (ref 20–29)
Calcium: 9 mg/dL (ref 8.6–10.2)
Chloride: 105 mmol/L (ref 96–106)
Creatinine, Ser: 1.41 mg/dL — ABNORMAL HIGH (ref 0.76–1.27)
Globulin, Total: 2.6 g/dL (ref 1.5–4.5)
Glucose: 164 mg/dL — ABNORMAL HIGH (ref 70–99)
Potassium: 4.7 mmol/L (ref 3.5–5.2)
Sodium: 139 mmol/L (ref 134–144)
Total Protein: 7 g/dL (ref 6.0–8.5)
eGFR: 55 mL/min/{1.73_m2} — ABNORMAL LOW (ref >59)

## 2022-07-27 LAB — CBC WITH DIFFERENTIAL/PLATELET
Basophils Absolute: 0.1 10*3/uL (ref 0.0–0.2)
Basos: 1 %
EOS (ABSOLUTE): 0.2 10*3/uL (ref 0.0–0.4)
Eos: 3 %
Hematocrit: 38.7 % (ref 37.5–51.0)
Hemoglobin: 13.1 g/dL (ref 13.0–17.7)
Immature Grans (Abs): 0 10*3/uL (ref 0.0–0.1)
Immature Granulocytes: 0 %
Lymphocytes Absolute: 2.7 10*3/uL (ref 0.7–3.1)
Lymphs: 40 %
MCH: 31.6 pg (ref 26.6–33.0)
MCHC: 33.9 g/dL (ref 31.5–35.7)
MCV: 93 fL (ref 79–97)
Monocytes Absolute: 0.6 10*3/uL (ref 0.1–0.9)
Monocytes: 9 %
Neutrophils Absolute: 3.3 10*3/uL (ref 1.4–7.0)
Neutrophils: 47 %
Platelets: 198 10*3/uL (ref 150–450)
RBC: 4.15 x10E6/uL (ref 4.14–5.80)
RDW: 12.9 % (ref 11.6–15.4)
WBC: 6.8 10*3/uL (ref 3.4–10.8)

## 2022-07-27 LAB — MICROALBUMIN / CREATININE URINE RATIO
Creatinine, Urine: 179.1 mg/dL
Microalb/Creat Ratio: 5 mg/g creat (ref 0–29)
Microalbumin, Urine: 9.3 ug/mL

## 2022-07-27 LAB — LIPID PANEL
Chol/HDL Ratio: 2.7 ratio (ref 0.0–5.0)
Cholesterol, Total: 106 mg/dL (ref 100–199)
HDL: 39 mg/dL — ABNORMAL LOW (ref >39)
LDL Chol Calc (NIH): 49 mg/dL (ref 0–99)
Triglycerides: 96 mg/dL (ref 0–149)
VLDL Cholesterol Cal: 18 mg/dL (ref 5–40)

## 2022-07-27 LAB — TSH: TSH: 1.94 u[IU]/mL (ref 0.450–4.500)

## 2022-07-30 ENCOUNTER — Telehealth: Payer: Self-pay | Admitting: Family Medicine

## 2022-07-30 NOTE — Telephone Encounter (Signed)
Patient would like clarification of how he is supposed to take mirtazapine (REMERON SOL-TAB) 15 MG disintegrating tablet

## 2022-07-30 NOTE — Telephone Encounter (Signed)
Left message informing that instructions are to take one 15mg  tablet nightly. Advised to call back with further concerns.

## 2022-08-03 ENCOUNTER — Encounter: Payer: PPO | Admitting: Nurse Practitioner

## 2022-08-03 DIAGNOSIS — Z23 Encounter for immunization: Secondary | ICD-10-CM

## 2022-08-03 NOTE — Progress Notes (Signed)
Patient atuallyjust needed pneumonia vaccine

## 2022-08-05 ENCOUNTER — Other Ambulatory Visit: Payer: Self-pay | Admitting: Family Medicine

## 2022-08-05 DIAGNOSIS — M109 Gout, unspecified: Secondary | ICD-10-CM

## 2022-09-07 ENCOUNTER — Other Ambulatory Visit: Payer: Self-pay | Admitting: Family Medicine

## 2022-09-13 ENCOUNTER — Ambulatory Visit: Payer: PPO | Admitting: Pharmacist

## 2022-10-05 ENCOUNTER — Other Ambulatory Visit: Payer: Self-pay | Admitting: Family Medicine

## 2022-11-18 ENCOUNTER — Other Ambulatory Visit: Payer: Self-pay | Admitting: Family Medicine

## 2023-01-19 ENCOUNTER — Other Ambulatory Visit: Payer: Self-pay | Admitting: Family Medicine

## 2023-01-19 DIAGNOSIS — F5104 Psychophysiologic insomnia: Secondary | ICD-10-CM

## 2023-01-21 ENCOUNTER — Ambulatory Visit (INDEPENDENT_AMBULATORY_CARE_PROVIDER_SITE_OTHER): Payer: PPO | Admitting: Family Medicine

## 2023-01-21 ENCOUNTER — Encounter: Payer: Self-pay | Admitting: Family Medicine

## 2023-01-21 DIAGNOSIS — S0990XD Unspecified injury of head, subsequent encounter: Secondary | ICD-10-CM | POA: Diagnosis not present
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
## 2023-01-23 ENCOUNTER — Telehealth: Payer: Self-pay | Admitting: Family Medicine

## 2023-01-23 NOTE — Telephone Encounter (Signed)
Contacted Ethan Peters to schedule their annual wellness visit. Appointment made for 01/29/2023.  Thank you,  Judeth Cornfield,  AMB Clinical Support [facility] Direct Dial ??[phone]

## 2023-01-29 ENCOUNTER — Ambulatory Visit (INDEPENDENT_AMBULATORY_CARE_PROVIDER_SITE_OTHER): Payer: PPO

## 2023-01-29 VITALS — Ht 66.0 in | Wt 168.0 lb

## 2023-01-29 DIAGNOSIS — Z0000 Encounter for general adult medical examination without abnormal findings: Secondary | ICD-10-CM

## 2023-01-29 NOTE — Progress Notes (Signed)
Subjective:   Ethan Peters is a 67 y.o. male who presents for Medicare Annual/Subsequent preventive examination. I connected with  Eber Jones on 01/29/23 by a audio enabled telemedicine application and verified that I am speaking with the correct person using two identifiers.  Patient Location: Home  Provider Location: Home Office  I discussed the limitations of evaluation and management by telemedicine. The patient expressed understanding and agreed to proceed.  Review of Systems     Cardiac Risk Factors include: advanced age (>56men, >87 women);diabetes mellitus;male gender;dyslipidemia;hypertension     Objective:    Today's Vitals   01/29/23 1454  Weight: 168 lb (76.2 kg)  Height:  (1.676 m)   Body mass index is 27.12 kg/m.     01/29/2023    2:59 PM 01/22/2022    4:43 PM 01/15/2022    1:27 PM 11/23/2020    1:24 PM 11/20/2019    1:58 PM 11/18/2018    5:23 PM 11/18/2018    2:45 PM  Advanced Directives  Does Patient Have a Medical Advance Directive? No No No No No  No  Would patient like information on creating a medical advance directive? No - Patient declined No - Patient declined No - Patient declined No - Patient declined No - Patient declined Yes (MAU/Ambulatory/Procedural Areas - Information given) No - Patient declined    Current Medications (verified) Outpatient Encounter Medications as of 01/29/2023  Medication Sig   allopurinol (ZYLOPRIM) 300 MG tablet TAKE ONE TABLET BY MOUTH ONCE DAILY AS NEEDED FOR GOUT PREVENTION   aspirin EC 81 MG EC tablet Take 1 tablet (81 mg total) by mouth daily.   atorvastatin (LIPITOR) 80 MG tablet TAKE 1 TABLET BY MOUTH ONCE DAILY AT 6 PM FOR CHOLESTEROL   B Complex-C (B-COMPLEX WITH VITAMIN C) tablet Take 1 tablet by mouth as needed (for vitamin deficiency).   brimonidine (ALPHAGAN) 0.2 % ophthalmic solution PLACE 1 DROP INTO BOTH EYES 3 (THREE) TIMES DAILY. GIVE 90-DAY SUPPLY AT A TIME   cholecalciferol (VITAMIN D3) 25 MCG  (1000 UNIT) tablet Take 1,000 Units by mouth daily.   dorzolamide-timolol (COSOPT) 22.3-6.8 MG/ML ophthalmic solution 1 DROP BOTH EYES TWICE A DAY. SEPARATE BY AT LEAST 10 MINUTES FROM OTHER PRESSURE REDUCING EYE DROPS (Patient taking differently: Place 1 drop into both eyes 2 (two) times daily.)   ezetimibe (ZETIA) 10 MG tablet Take 1 tablet (10 mg total) by mouth daily.   glipiZIDE (GLIPIZIDE XL) 2.5 MG 24 hr tablet Take 1 tablet (2.5 mg total) by mouth 2 (two) times daily.   glucose blood (ONETOUCH VERIO) test strip Test BS 4 times daily Dx Z61.09   ibuprofen (ADVIL) 800 MG tablet Take 1 tablet (800 mg total) by mouth 2 (two) times daily as needed.   insulin aspart (NOVOLOG) 100 UNIT/ML injection Inject 20-30 Units into the skin 3 (three) times daily before meals.   insulin aspart protamine- aspart (NOVOLOG MIX 70/30) (70-30) 100 UNIT/ML injection Inject 0.24-0.3 mLs (24-30 Units total) into the skin 3 (three) times daily with meals.   latanoprost (XALATAN) 0.005 % ophthalmic solution INSTILL 1 DROP IN BOTH EYES EVERY EVENING   levothyroxine (SYNTHROID) 50 MCG tablet Take 1 tablet (50 mcg total) by mouth daily.   metFORMIN (GLUCOPHAGE) 1000 MG tablet TAKE 1 TABLET (1,000 MG TOTAL) BY MOUTH TWICE A DAY WITH FOOD   mirtazapine (REMERON SOL-TAB) 15 MG disintegrating tablet TAKE 1 TABLET BY MOUTH EVERYDAY AT BEDTIME   nitroGLYCERIN (NITROSTAT) 0.4 MG  SL tablet Place 1 tablet (0.4 mg total) under the tongue every 5 (five) minutes x 3 doses as needed for chest pain.   No facility-administered encounter medications on file as of 01/29/2023.    Allergies (verified) Levemir [insulin detemir], Lisinopril, and Penicillins   History: Past Medical History:  Diagnosis Date   CAD (coronary artery disease) 01/09/13   Inferior STEMI s/p DES-RCA   Cataract    Diabetes mellitus    Uncontrolled, Hgb A1C 14.8% on 03/14, started on insulin   Gout    Hyperlipidemia    Hypertension    Ischemic  cardiomyopathy    EF 45-50%, grade 1 diastolic dysfunction, mildly dilated RV, RA at the upper limits of normal, mild TR, PA systolic pressure 32 mm mercury and hypokinesis to akinesis of the basal mid inferior myocardium   Myocardial infarction 01/09/2013   Shortness of breath    Thyroid disease    Past Surgical History:  Procedure Laterality Date   CARDIAC CATHETERIZATION  01/22/2013   Diffuse borderline residual CAD consistent with uncontrolled diabetes, medical management recommended   CORONARY ANGIOPLASTY WITH STENT PLACEMENT  01/09/2013   30% pLAD, 30% mLAD, 70% pLCx, RCA occlusion at crux with R->L distal collaterals s/p DES; LVEF 50%, moderate-severe HK of inferior basal wall   EYE SURGERY Bilateral    LEFT HEART CATHETERIZATION WITH CORONARY ANGIOGRAM N/A 01/09/2013   Procedure: LEFT HEART CATHETERIZATION WITH CORONARY ANGIOGRAM;  Surgeon: Peter M Swaziland, MD;  Location: [facility] CATH LAB;  Service: Cardiovascular;  Laterality: N/A;   LEFT HEART CATHETERIZATION WITH CORONARY ANGIOGRAM N/A 01/22/2013   Procedure: LEFT HEART CATHETERIZATION WITH CORONARY ANGIOGRAM;  Surgeon: Tonny Bollman, MD;  Location[facility] C[facility] LAB;  Service: Cardiovascular;  Laterality: N/A;   Family History  Problem Relation Age of Onset   Heart disease Mother    Heart disease Father    Heart disease Brother    Heart disease Brother    Colon cancer Neg Hx    Colon polyps Neg Hx    Social History   Socioeconomic History   Marital status: Married    Spouse name: Wendall Papa   Number of children: 1   Years of education: 16   Highest education level: Associate degree: occupational, Scientist, product/process development, or vocational program  Occupational History   Occupation: disabled    Comment: Chartered certified accountant   Tobacco Use   Smoking status: Never   Smokeless tobacco: Never  Vaping Use   Vaping Use: Never used  Substance and Sexual Activity   Alcohol use: Yes    Comment: 4 times per year-wine or beer   Drug use: No   Sexual activity:  Yes    Birth control/protection: None  Other Topics Concern   Not on file  Social History Narrative   Spanish-speaking. Limited English.    Social Determinants of Health   Financial Resource Strain: Low Risk  (01/29/2023)   Overall Financial Resource Strain (CARDIA)    Difficulty of Paying Living Expenses: Not hard at all  Food Insecurity: No Food Insecurity (01/29/2023)   Hunger Vital Sign    Worried About Running Out of Food in the Last Year: Never true    Ran Out of Food in the Last Year: Never true  Transportation Needs: No Transportation Needs (01/29/2023)   PRAPARE - Administrator, Civil Service (Medical): No    Lack of Transportation (Non-Medical): No  Physical Activity: Insufficiently Active (01/29/2023)   Exercise Vital Sign    Days of Exercise per  Week: 3 days    Minutes of Exercise per Session: 30 min  Stress: No Stress Concern Present (01/29/2023)   [employer] of Occupational Health - Occupational Stress Questionnaire    Feeling of Stress : Not at all  Social Connections: Moderately Isolated (01/29/2023)   Social Connection and Isolation Panel [NHANES]    Frequency of Communication with Friends and Family: More than three times a week    Frequency of Social Gatherings with Friends and Family: More than three times a week    Attends Religious Services: Never    Database administrator or Organizations: No    Attends Engineer, structural: Never    Marital Status: Married    Tobacco Counseling Counseling given: Not Answered   Clinical Intake:  Pre-visit preparation completed: Yes  Pain : No/denies pain     Nutritional Risks: None Diabetes: Yes CBG done?: No Did pt. bring in CBG monitor from home?: No  How often do you need to have someone help you when you read instructions, pamphlets, or other written materials from your doctor or pharmacy?: 1 - Never  Diabetic?yes Nutrition Risk Assessment:  Has the patient had any N/V/D  within the last 2 months?  No  Does the patient have any non-healing wounds?  No  Has the patient had any unintentional weight loss or weight gain?  No   Diabetes:  Is the patient diabetic?  Yes  If diabetic, was a CBG obtained today?  No  Did the patient bring in their glucometer from home?  No  How often do you monitor your CBG's? 4 x day .   Financial Strains and Diabetes Management:  Are you having any financial strains with the device, your supplies or your medication? No .  Does the patient want to be seen by Chronic Care Management for management of their diabetes?  No  Would the patient like to be referred to a Nutritionist or for Diabetic Management?  No   Diabetic Exams:  Diabetic Eye Exam: Completed 07/2022 Diabetic Foot Exam: Overdue, Pt has been advised about the importance in completing this exam. Pt is scheduled for diabetic foot exam on next office visit .   Interpreter Needed?: No  Information entered by :: Renie Ora, LPN   Activities of Daily Living    01/29/2023    2:59 PM  In your present state of health, do you have any difficulty performing the following activities:  Hearing? 0  Vision? 0  Difficulty concentrating or making decisions? 0  Walking or climbing stairs? 0  Dressing or bathing? 0  Doing errands, shopping? 0  Preparing Food and eating ? N  Using the Toilet? N  In the past six months, have you accidently leaked urine? N  Do you have problems with loss of bowel control? N  Managing your Medications? N  Managing your Finances? N  Housekeeping or managing your Housekeeping? N    Patient Care Team: Dettinger, Elige Radon, MD as PCP - General (Family Medicine) Swaziland, Peter M, MD as PCP - Cardiology (Cardiology) Roma Kayser, MD as Consulting Physician (Endocrinology) Stephannie Li, MD as Consulting Physician (Ophthalmology)  Indicate any recent Medical Services you may have received from other than [facility] providers in the past  year (date may be approximate).     Assessment:   This is a routine wellness examination for [place].  Hearing/Vision screen Vision Screening - Comments:: Wears rx glasses - up to date with routine eye exams with  [facility]  Dietary issues and exercise activities discussed: Current Exercise Habits: Home exercise routine, Type of exercise: walking, Time (Minutes): 30, Frequency (Times/Week): 3, Weekly Exercise (Minutes/Week): 90, Intensity: Mild, Exercise limited by: None identified   Goals Addressed             This Visit's Progress    DIET - EAT MORE FRUITS AND VEGETABLES   On track    DIET - INCREASE WATER INTAKE   On track    Try to drink 6-8 glasses of water daily       Depression Screen    01/29/2023    2:57 PM 01/21/2023    9:41 AM 07/26/2022   10:28 AM 04/25/2022   11:22 AM 02/05/2022    9:10 AM 01/31/2022    8:50 AM 01/22/2022    2:12 PM  PHQ 2/9 Scores  PHQ - 2 Score 0 0 0 0  PHQ- 9 Score 0 Fall Risk    01/29/2023    2:56 PM 01/21/2023    9:40 AM 07/26/2022   10:28 AM 04/25/2022   11:22 AM 02/05/2022    9:10 AM  Fall Risk   Falls in the past year? 0 0 0 0 0  Number falls in past yr: 0      Injury with Fall? 0      Risk for fall due to : No Fall Risks      Follow up Falls prevention discussed        FALL RISK PREVENTION PERTAINING TO THE HOME:  Any stairs in or around the home? No  If so, are there any without handrails? No  Home free of loose throw rugs in walkways, pet beds, electrical cords, etc? Yes  Adequate lighting in your home to reduce risk of falls? Yes   ASSISTIVE DEVICES UTILIZED TO PREVENT FALLS:  Life alert? No  Use of a cane, walker or w/c? No  Grab bars in the bathroom? No  Shower chair or bench in shower? No  Elevated toilet seat or a handicapped toilet? No       11/18/2018    5:23 PM  MMSE - Mini Mental State Exam  Orientation to time 5  Orientation to Place 5  Registration 3  Attention/ Calculation 5   Recall 1  Language- name 2 objects 2  Language- repeat 0  Language- follow 3 step command 3  Language- read & follow direction 1  Write a sentence 1  Copy design 1  Total score 27        01/29/2023    3:00 PM 11/23/2020    1:24 PM 11/20/2019    2:05 PM  6CIT Screen  What Year? 0 points 0 points 0 points  What month? 0 points 0 points 0 points  What time? 0 points 0 points 0 points  Count back from 20 0 points 0 points 0 points  Months in reverse 0 points 0 points 0 points  Repeat phrase 0 points 0 points 2 points  Total Score 0 points 0 points 2 points    Immunizations Immunization History  Administered Date(s) Administered   Fluad Quad(high Dose 65+) 08/30/2020, 07/26/2021, 07/26/2022   Influenza Inj Mdck Quad With Preservative 07/28/2019   Influenza,inj,Quad PF,6+ Mos 08/22/2015, 07/27/2016, 07/16/2017, 08/13/2018   PNEUMOCOCCAL CONJUGATE-20 08/03/2022   Pneumococcal Conjugate-13 11/27/2016   Pneumococcal Polysaccharide-23 09/25/2007, 08/13/2018, 09/30/2019, 08/30/2020   Tdap 03/31/2012, 04/25/2022   Zoster Recombinat (Shingrix)  01/18/2022    TDAP status: Up to date  Flu Vaccine status: Up to date  Pneumococcal vaccine status: Up to date  Covid-19 vaccine status: Completed vaccines  Qualifies for Shingles Vaccine? Yes   Zostavax completed Yes   Shingrix Completed?: Yes  Screening Tests Health Maintenance  Topic Date Due   COVID-19 Vaccine (1) Never done   HEMOGLOBIN A1C  01/25/2023   COLONOSCOPY (Pts 45-29yrs Insurance coverage will need to be confirmed)  07/27/2023 (Originally 08/24/2000)   Zoster Vaccines- Shingrix (2 of 2) 07/29/2023 (Originally 03/15/2022)   INFLUENZA VACCINE  05/16/2023   Diabetic kidney evaluation - eGFR measurement  07/27/2023   Diabetic kidney evaluation - Urine ACR  07/27/2023   FOOT EXAM  07/27/2023   Medicare Annual Wellness (AWV)  01/29/2024   DTaP/Tdap/Td (3 - Td or Tdap) 04/25/2032   Pneumonia Vaccine 54+ Years old  Completed    Hepatitis C Screening  Completed   HPV VACCINES  Aged Out   OPHTHALMOLOGY EXAM  Discontinued    Health Maintenance  Health Maintenance Due  Topic Date Due   COVID-19 Vaccine (1) Never done   HEMOGLOBIN A1C  01/25/2023    Colorectal cancer screening: Referral to GI placed declined will discuss with PCP . Pt aware the office will call re: appt.  Lung Cancer Screening: (Low Dose CT Chest recommended if Age 48-80 years, 30 pack-year currently smoking OR have quit w/in 15years.) does not qualify.   Lung Cancer Screening Referral: n/a  Additional Screening:  Hepatitis C Screening: does not qualify; Completed 01/28/2020  Vision Screening: Recommended annual ophthalmology exams for early detection of glaucoma and other disorders of the eye. Is the patient up to date with their annual eye exam?  Yes  Who is the provider or what is the name of the office in which the patient attends annual eye exams? [facility]  If pt is not established with a provider, would they like to be referred to a provider to establish care? No .   Dental Screening: Recommended annual dental exams for proper oral hygiene  Community Resource Referral / Chronic Care Management: CRR required this visit?  No   CCM required this visit?  No      Plan:     I have personally reviewed and noted the following in the patient's chart:   Medical and social history Use of alcohol, tobacco or illicit drugs  Current medications and supplements including opioid prescriptions. Patient is not currently taking opioid prescriptions. Functional ability and status Nutritional status Physical activity Advanced directives List of other physicians Hospitalizations, surgeries, and ER visits in previous 12 months Vitals Screenings to include cognitive, depression, and falls Referrals and appointments  In addition, I have reviewed and discussed with patient certain preventive protocols, quality metrics, and best practice  recommendations. A written personalized care plan for preventive services as well as general preventive health recommendations were provided to patient.     Lorrene Reid, LPN   [id number]/61/0960   Nurse Notes: Will discuss Colonoscopy with PCP

## 2023-01-29 NOTE — Patient Instructions (Signed)
[name] , Thank you for taking time to come for your Medicare Wellness Visit. I appreciate your ongoing commitment to your health goals. Please review the following plan we discussed and let me know if I can assist you in the future.   These are the goals we discussed:  Goals       DIET - EAT MORE FRUITS AND VEGETABLES      DIET - INCREASE WATER INTAKE      Try to drink 6-8 glasses of water daily      Patient Stated (pt-stated)      Continue healthy diet and exercise regimen.         This is a list of the screening recommended for you and due dates:  Health Maintenance  Topic Date Due   COVID-19 Vaccine (1) Never done   Hemoglobin A1C  01/25/2023   Colon Cancer Screening  07/27/2023*   Zoster (Shingles) Vaccine (2 of 2) 07/29/2023*   Flu Shot  05/16/2023   Yearly kidney function blood test for diabetes  07/27/2023   Yearly kidney health urinalysis for diabetes  07/27/2023   Complete foot exam   07/27/2023   Medicare Annual Wellness Visit  01/29/2024   DTaP/Tdap/Td vaccine (3 - Td or Tdap) 04/25/2032   Pneumonia Vaccine  Completed   Hepatitis C Screening: USPSTF Recommendation to screen - Ages 35-79 yo.  Completed   HPV Vaccine  Aged Out   Eye exam for diabetics  Discontinued  *Topic was postponed. The date shown is not the original due date.    Advanced directives: Advance directive discussed with you today. I have provided a copy for you to complete at home and have notarized. Once this is complete please bring a copy in to our office so we can scan it into your chart.   Conditions/risks identified: Aim for 30 minutes of exercise or brisk walking, 6-8 glasses of water, and 5 servings of fruits and vegetables each day.   Next appointment: Follow up in one year for your annual wellness visit.   Preventive Care 68 Years and Older, Male  Preventive care refers to lifestyle choices and visits with your health care provider that can promote health and wellness. What does  preventive care include? A yearly physical exam. This is also called an annual well check. Dental exams once or twice a year. Routine eye exams. Ask your health care provider how often you should have your eyes checked. Personal lifestyle choices, including: Daily care of your teeth and gums. Regular physical activity. Eating a healthy diet. Avoiding tobacco and drug use. Limiting alcohol use. Practicing safe sex. Taking low doses of aspirin every day. Taking vitamin and mineral supplements as recommended by your health care provider. What happens during an annual well check? The services and screenings done by your health care provider during your annual well check will depend on your age, overall health, lifestyle risk factors, and family history of disease. Counseling  Your health care provider may ask you questions about your: Alcohol use. Tobacco use. Drug use. Emotional well-being. Home and relationship well-being. Sexual activity. Eating habits. History of falls. Memory and ability to understand (cognition). Work and work Astronomer. Screening  You may have the following tests or measurements: Height, weight, and BMI. Blood pressure. Lipid and cholesterol levels. These may be checked every 5 years, or more frequently if you are over 51 years old. Skin check. Lung cancer screening. You may have this screening every year starting at age  68 if you have a 30-pack-year history of smoking and currently smoke or have quit within the past 15 years. Fecal occult blood test (FOBT) of the stool. You may have this test every year starting at age 68. Flexible sigmoidoscopy or colonoscopy. You may have a sigmoidoscopy every 5 years or a colonoscopy every 10 years starting at age 68. Prostate cancer screening. Recommendations will vary depending on your family history and other risks. Hepatitis C blood test. Hepatitis B blood test. Sexually transmitted disease (STD) testing. Diabetes  screening. This is done by checking your blood sugar (glucose) after you have not eaten for a while (fasting). You may have this done every 1-3 years. Abdominal aortic aneurysm (AAA) screening. You may need this if you are a current or former smoker. Osteoporosis. You may be screened starting at age 71 if you are at high risk. Talk with your health care provider about your test results, treatment options, and if necessary, the need for more tests. Vaccines  Your health care provider may recommend certain vaccines, such as: Influenza vaccine. This is recommended every year. Tetanus, diphtheria, and acellular pertussis (Tdap, Td) vaccine. You may need a Td booster every 10 years. Zoster vaccine. You may need this after age 72. Pneumococcal 13-valent conjugate (PCV13) vaccine. One dose is recommended after age 62. Pneumococcal polysaccharide (PPSV23) vaccine. One dose is recommended after age 77. Talk to your health care provider about which screenings and vaccines you need and how often you need them. This information is not intended to replace advice given to you by your health care provider. Make sure you discuss any questions you have with your health care provider. Document Released: 10/28/2015 Document Revised: 06/20/2016 Document Reviewed: 08/02/2015 Elsevier Interactive Patient Education  2017 [employer].  Fall Prevention in the Home Falls can cause injuries. They can happen to people of all ages. There are many things you can do to make your home safe and to help prevent falls. What can I do on the outside of my home? Regularly fix the edges of walkways and driveways and fix any cracks. Remove anything that might make you trip as you walk through a door, such as a raised step or threshold. Trim any bushes or trees on the path to your home. Use bright outdoor lighting. Clear any walking paths of anything that might make someone trip, such as rocks or tools. Regularly check to see if  handrails are loose or broken. Make sure that both sides of any steps have handrails. Any raised decks and porches should have guardrails on the edges. Have any leaves, snow, or ice cleared regularly. Use sand or salt on walking paths during winter. Clean up any spills in your garage right away. This includes oil or grease spills. What can I do in the bathroom? Use night lights. Install grab bars by the toilet and in the tub and shower. Do not use towel bars as grab bars. Use non-skid mats or decals in the tub or shower. If you need to sit down in the shower, use a plastic, non-slip stool. Keep the floor dry. Clean up any water that spills on the floor as soon as it happens. Remove soap buildup in the tub or shower regularly. Attach bath mats securely with double-sided non-slip rug tape. Do not have throw rugs and other things on the floor that can make you trip. What can I do in the bedroom? Use night lights. Make sure that you have a light by your bed  that is easy to reach. Do not use any sheets or blankets that are too big for your bed. They should not hang down onto the floor. Have a firm chair that has side arms. You can use this for support while you get dressed. Do not have throw rugs and other things on the floor that can make you trip. What can I do in the kitchen? Clean up any spills right away. Avoid walking on wet floors. Keep items that you use a lot in easy-to-reach places. If you need to reach something above you, use a strong step stool that has a grab bar. Keep electrical cords out of the way. Do not use floor polish or wax that makes floors slippery. If you must use wax, use non-skid floor wax. Do not have throw rugs and other things on the floor that can make you trip. What can I do with my stairs? Do not leave any items on the stairs. Make sure that there are handrails on both sides of the stairs and use them. Fix handrails that are broken or loose. Make sure that  handrails are as long as the stairways. Check any carpeting to make sure that it is firmly attached to the stairs. Fix any carpet that is loose or worn. Avoid having throw rugs at the top or bottom of the stairs. If you do have throw rugs, attach them to the floor with carpet tape. Make sure that you have a light switch at the top of the stairs and the bottom of the stairs. If you do not have them, ask someone to add them for you. What else can I do to help prevent falls? Wear shoes that: Do not have high heels. Have rubber bottoms. Are comfortable and fit you well. Are closed at the toe. Do not wear sandals. If you use a stepladder: Make sure that it is fully opened. Do not climb a closed stepladder. Make sure that both sides of the stepladder are locked into place. Ask someone to hold it for you, if possible. Clearly mark and make sure that you can see: Any grab bars or handrails. First and last steps. Where the edge of each step is. Use tools that help you move around (mobility aids) if they are needed. These include: Canes. Walkers. Scooters. Crutches. Turn on the lights when you go into a dark area. Replace any light bulbs as soon as they burn out. Set up your furniture so you have a clear path. Avoid moving your furniture around. If any of your floors are uneven, fix them. If there are any pets around you, be aware of where they are. Review your medicines with your doctor. Some medicines can make you feel dizzy. This can increase your chance of falling. Ask your doctor what other things that you can do to help prevent falls. This information is not intended to replace advice given to you by your health care provider. Make sure you discuss any questions you have with your health care provider. Document Released: 07/28/2009 Document Revised: 03/08/2016 Document Reviewed: 11/05/2014 Elsevier Interactive Patient Education  2017 [employer].

## 2023-01-30 ENCOUNTER — Ambulatory Visit (INDEPENDENT_AMBULATORY_CARE_PROVIDER_SITE_OTHER): Payer: PPO | Admitting: Family Medicine

## 2023-01-30 ENCOUNTER — Encounter: Payer: Self-pay | Admitting: Family Medicine

## 2023-01-30 ENCOUNTER — Other Ambulatory Visit: Payer: Self-pay | Admitting: Family Medicine

## 2023-01-30 VITALS — BP 126/80 | HR 77 | Ht 66.0 in | Wt 172.0 lb

## 2023-01-30 DIAGNOSIS — I10 Essential (primary) hypertension: Secondary | ICD-10-CM

## 2023-01-30 DIAGNOSIS — I152 Hypertension secondary to endocrine disorders: Secondary | ICD-10-CM

## 2023-01-30 DIAGNOSIS — E1140 Type 2 diabetes mellitus with diabetic neuropathy, unspecified: Secondary | ICD-10-CM | POA: Diagnosis not present

## 2023-01-30 DIAGNOSIS — E1159 Type 2 diabetes mellitus with other circulatory complications: Secondary | ICD-10-CM | POA: Diagnosis not present

## 2023-01-30 DIAGNOSIS — E1342 Other specified diabetes mellitus with diabetic polyneuropathy: Secondary | ICD-10-CM

## 2023-01-30 DIAGNOSIS — E039 Hypothyroidism, unspecified: Secondary | ICD-10-CM | POA: Diagnosis not present

## 2023-01-30 DIAGNOSIS — E782 Mixed hyperlipidemia: Secondary | ICD-10-CM

## 2023-01-30 DIAGNOSIS — S0990XD Unspecified injury of head, subsequent encounter: Secondary | ICD-10-CM | POA: Diagnosis not present

## 2023-01-30 LAB — BAYER DCA HB A1C WAIVED: HB A1C (BAYER DCA - WAIVED): 7.4 % — ABNORMAL HIGH (ref 4.8–5.6)

## 2023-01-30 MED ORDER — GLUCERNA 1.0 CAL PO LIQD
500.0000 mL | Freq: Every day | ORAL | 3 refills | Status: DC
Start: 2023-01-30 — End: 2023-10-31

## 2023-01-30 NOTE — Progress Notes (Signed)
BP 126/80   Pulse 77   Ht 5\' 6"  (1.676 m)   Wt 172 lb (78 kg)   SpO2 97%   BMI 27.76 kg/m    Subjective:   Patient ID: Ethan Peters, male    DOB: 04-18-1956, 67 y.o.   MRN: [id number]  HPI: Ethan Peters is a 67 y.o. male presenting on 01/30/2023 for Medical Management of Chronic Issues, Hyperlipidemia, Diabetes, Hypertension, and Hypothyroidism   HPI Hypertension and CAD Patient is currently on no medicine currently, has been off the candesartan for a week and his blood pressure looks great today., per patient he thinks he has been on candesartan, and their blood pressure today is 126/80. Patient denies any lightheadedness or dizziness. Patient denies headaches, blurred vision, chest pains, shortness of breath, or weakness. Denies any side effects from medication and is content with current medication.  Patient has diabetes and trouble keeping up on his nutrition and would like to get a prescription for Glucerna to see if that can help.  Type 2 diabetes mellitus Patient comes in today for recheck of his diabetes. Patient has been currently taking glipizide and NovoLog and NovoLog 70/30 and metformin. Patient is currently on an ACE inhibitor/ARB. Patient has not seen an ophthalmologist this year. Patient denies any new issues with their feet. The symptom started onset as an adult hypertension and hyperlipidemia and hypothyroidism and neuropathy and CAD ARE RELATED TO DM   Hypothyroidism recheck Patient is coming in for thyroid recheck today as well. They deny any issues with hair changes or heat or cold problems or diarrhea or constipation. They deny any chest pain or palpitations. They are currently on levothyroxine 50 micrograms   Hyperlipidemia Patient is coming in for recheck of his hyperlipidemia. The patient is currently taking Zetia and atorvastatin. They deny any issues with myalgias or history of liver damage from it. They deny any focal numbness or weakness or chest  pain.   Patient would like to go see Dr. Franky Macho for the recent head trauma for evaluation rather than neurology.  Relevant past medical, surgical, family and social history reviewed and updated as indicated. Interim medical history since our last visit reviewed. Allergies and medications reviewed and updated.  Review of Systems  Constitutional:  Negative for chills and fever.  Eyes:  Positive for visual disturbance.  Respiratory:  Negative for shortness of breath and wheezing.   Cardiovascular:  Negative for chest pain and leg swelling.  Skin:  Negative for rash.  Neurological:  Negative for dizziness, weakness and light-headedness.  All other systems reviewed and are negative.   Per HPI unless specifically indicated above   Allergies as of 01/30/2023       Reactions   Levemir [insulin Detemir] Swelling   Lisinopril Cough   Penicillins Rash   Lip swelling        Medication List        Accurate as of January 30, 2023  4:23 PM. If you have any questions, ask your nurse or doctor.          allopurinol 300 MG tablet Commonly known as: ZYLOPRIM TAKE ONE TABLET BY MOUTH ONCE DAILY AS NEEDED FOR GOUT PREVENTION   aspirin EC 81 MG tablet Take 1 tablet (81 mg total) by mouth daily.   atorvastatin 80 MG tablet Commonly known as: LIPITOR TAKE 1 TABLET BY MOUTH ONCE DAILY AT 6 PM FOR CHOLESTEROL   B-complex with vitamin C tablet Take 1 tablet by mouth as  needed (for vitamin deficiency).   brimonidine 0.2 % ophthalmic solution Commonly known as: ALPHAGAN PLACE 1 DROP INTO BOTH EYES 3 (THREE) TIMES DAILY. GIVE 90-DAY SUPPLY AT A TIME   cholecalciferol 25 MCG (1000 UNIT) tablet Commonly known as: VITAMIN D3 Take 1,000 Units by mouth daily.   dorzolamide-timolol 2-0.5 % ophthalmic solution Commonly known as: COSOPT 1 DROP BOTH EYES TWICE A DAY. SEPARATE BY AT LEAST 10 MINUTES FROM OTHER PRESSURE REDUCING EYE DROPS What changed: See the new instructions.   ezetimibe  10 MG tablet Commonly known as: ZETIA Take 1 tablet (10 mg total) by mouth daily.   glipiZIDE 2.5 MG 24 hr tablet Commonly known as: glipiZIDE XL Take 1 tablet (2.5 mg total) by mouth 2 (two) times daily.   Glucerna 1.0 Cal Liqd Take 500 mLs by mouth daily. Started by: Nils Pyle, MD   ibuprofen 800 MG tablet Commonly known as: ADVIL Take 1 tablet (800 mg total) by mouth 2 (two) times daily as needed.   insulin aspart 100 UNIT/ML injection Commonly known as: novoLOG Inject 20-30 Units into the skin 3 (three) times daily before meals.   insulin aspart protamine- aspart (70-30) 100 UNIT/ML injection Commonly known as: NovoLOG Mix 70/30 Inject 0.24-0.3 mLs (24-30 Units total) into the skin 3 (three) times daily with meals.   latanoprost 0.005 % ophthalmic solution Commonly known as: XALATAN INSTILL 1 DROP IN BOTH EYES EVERY EVENING   levothyroxine 50 MCG tablet Commonly known as: SYNTHROID Take 1 tablet (50 mcg total) by mouth daily.   metFORMIN 1000 MG tablet Commonly known as: GLUCOPHAGE TAKE 1 TABLET (1,000 MG TOTAL) BY MOUTH TWICE A DAY WITH FOOD   mirtazapine 15 MG disintegrating tablet Commonly known as: REMERON SOL-TAB TAKE 1 TABLET BY MOUTH EVERYDAY AT BEDTIME   nitroGLYCERIN 0.4 MG SL tablet Commonly known as: NITROSTAT Place 1 tablet (0.4 mg total) under the tongue every 5 (five) minutes x 3 doses as needed for chest pain.   OneTouch Verio test strip Generic drug: glucose blood Test BS 4 times daily Dx J47.82         Objective:   BP 126/80   Pulse 77   Ht  (1.676 m)   Wt 172 lb (78 kg)   SpO2 97%   BMI 27.76 kg/m   Wt Readings from Last 3 Encounters:  01/30/23 172 lb (78 kg)  01/29/23 168 lb (76.2 kg)  01/21/23 168 lb (76.2 kg)    Physical Exam Vitals and nursing note reviewed.  Constitutional:      General: He is not in acute distress.    Appearance: He is well-developed. He is not diaphoretic.  Neck:     Thyroid: No  thyromegaly.  Cardiovascular:     Rate and Rhythm: Normal rate and regular rhythm.     Heart sounds: Normal heart sounds. No murmur heard. Pulmonary:     Effort: Pulmonary effort is normal. No respiratory distress.     Breath sounds: Normal breath sounds. No wheezing.  Musculoskeletal:        General: No swelling. Normal range of motion.     Cervical back: Neck supple.  Lymphadenopathy:     Cervical: No cervical adenopathy.  Skin:    General: Skin is warm and dry.     Findings: No rash.  Neurological:     Mental Status: He is alert and oriented to person, place, and time.     Coordination: Coordination normal.  Psychiatric:  Behavior: Behavior normal.       Assessment & Plan:   Problem List Items Addressed This Visit       Cardiovascular and Mediastinum   DM type 2 causing vascular disease (HCC)   Relevant Medications   Nutritional Supplements (GLUCERNA 1.0 CAL) LIQD   Essential hypertension - Primary   Relevant Orders   CBC with Differential/Platelet   CMP14+EGFR   Lipid panel   Bayer DCA Hb A1c Waived     Endocrine   Diabetic neuropathy   Relevant Medications   Nutritional Supplements (GLUCERNA 1.0 CAL) LIQD   Other Relevant Orders   CBC with Differential/Platelet   CMP14+EGFR   Lipid panel   Bayer DCA Hb A1c Waived   Hypothyroidism   Relevant Orders   CBC with Differential/Platelet   CMP14+EGFR   Lipid panel   Bayer DCA Hb A1c Waived   TSH     Other   Mixed hyperlipidemia   Relevant Orders   CBC with Differential/Platelet   CMP14+EGFR   Lipid panel   Bayer DCA Hb A1c Waived   Other Visit Diagnoses     Traumatic injury of head, subsequent encounter       Relevant Orders   Ambulatory referral to Neurosurgery     Bruises all healed up now, will do neurosurgery referral for evaluation.  Blood pressure looks good without the candesartan, will stay off of it for now.  Had 1 episode of hypoglycemia, he will monitor closely for this and  let me know if he has more.  Will give Glucerna to help with nutrition  A1c is up slightly at 7.4, mainly focus on diet for this point.  Follow up plan: Return in about 3 months (around 05/01/2023), or if symptoms worsen or fail to improve, for Diabetes recheck.  Counseling provided for all of the vaccine components Orders Placed This Encounter  Procedures   CBC with Differential/Platelet   CMP14+EGFR   Lipid panel   Bayer DCA Hb A1c Waived   TSH   Ambulatory referral to Neurosurgery    Arville Care, MD [facility] 01/30/2023, 4:23 PM

## 2023-01-31 LAB — LIPID PANEL
Chol/HDL Ratio: 3.6 ratio (ref 0.0–5.0)
Cholesterol, Total: 155 mg/dL (ref 100–199)
HDL: 43 mg/dL (ref >39)
LDL Chol Calc (NIH): 86 mg/dL (ref 0–99)
Triglycerides: 150 mg/dL — ABNORMAL HIGH (ref 0–149)
VLDL Cholesterol Cal: 26 mg/dL (ref 5–40)

## 2023-01-31 LAB — CBC WITH DIFFERENTIAL/PLATELET
Basophils Absolute: 0 10*3/uL (ref 0.0–0.2)
Basos: 1 %
EOS (ABSOLUTE): 0.1 10*3/uL (ref 0.0–0.4)
Eos: 1 %
Hematocrit: 36.6 % — ABNORMAL LOW (ref 37.5–51.0)
Hemoglobin: 11.9 g/dL — ABNORMAL LOW (ref 13.0–17.7)
Immature Grans (Abs): 0 10*3/uL (ref 0.0–0.1)
Immature Granulocytes: 0 %
Lymphocytes Absolute: 2 10*3/uL (ref 0.7–3.1)
Lymphs: 38 %
MCH: 31.1 pg (ref 26.6–33.0)
MCHC: 32.5 g/dL (ref 31.5–35.7)
MCV: 96 fL (ref 79–97)
Monocytes Absolute: 0.4 10*3/uL (ref 0.1–0.9)
Monocytes: 8 %
Neutrophils Absolute: 2.7 10*3/uL (ref 1.4–7.0)
Neutrophils: 52 %
Platelets: 291 10*3/uL (ref 150–450)
RBC: 3.83 x10E6/uL — ABNORMAL LOW (ref 4.14–5.80)
RDW: 11.9 % (ref 11.6–15.4)
WBC: 5.3 10*3/uL (ref 3.4–10.8)

## 2023-01-31 LAB — CMP14+EGFR
ALT: 18 IU/L (ref 0–44)
AST: 13 IU/L (ref 0–40)
Albumin/Globulin Ratio: 1.5 (ref 1.2–2.2)
Albumin: 4.5 g/dL (ref 3.9–4.9)
Alkaline Phosphatase: 98 IU/L (ref 44–121)
BUN/Creatinine Ratio: 10 (ref 10–24)
BUN: 13 mg/dL (ref 8–27)
Bilirubin Total: 0.4 mg/dL (ref 0.0–1.2)
CO2: 22 mmol/L (ref 20–29)
Calcium: 9.7 mg/dL (ref 8.6–10.2)
Chloride: 105 mmol/L (ref 96–106)
Creatinine, Ser: 1.27 mg/dL (ref 0.76–1.27)
Globulin, Total: 3.1 g/dL (ref 1.5–4.5)
Glucose: 136 mg/dL — ABNORMAL HIGH (ref 70–99)
Potassium: 4.9 mmol/L (ref 3.5–5.2)
Sodium: 144 mmol/L (ref 134–144)
Total Protein: 7.6 g/dL (ref 6.0–8.5)
eGFR: 62 mL/min/{1.73_m2} (ref >59)

## 2023-01-31 LAB — TSH: TSH: 1.76 u[IU]/mL (ref 0.450–4.500)

## 2023-01-31 NOTE — Telephone Encounter (Signed)
Please let the patient know that we got a kickback saying this is not a product they can bill or stock at the pharmacy, so I do not know if we can get it through insurance.

## 2023-01-31 NOTE — Telephone Encounter (Signed)
  Glucerna (GLUCERNA) LIQD       Pharmacy comment: Product Backordered/Unavailable:THIS IS NOT A PRODUCT WE CAN BILL OR STOCK.

## 2023-02-05 NOTE — Telephone Encounter (Signed)
Attempted to call pt, line is busy

## 2023-02-21 ENCOUNTER — Other Ambulatory Visit (HOSPITAL_COMMUNITY): Payer: Self-pay | Admitting: Neurosurgery

## 2023-02-21 DIAGNOSIS — S062X1A Diffuse traumatic brain injury with loss of consciousness of 30 minutes or less, initial encounter: Secondary | ICD-10-CM | POA: Diagnosis not present

## 2023-02-21 DIAGNOSIS — S066X1A Traumatic subarachnoid hemorrhage with loss of consciousness of 30 minutes or less, initial encounter: Secondary | ICD-10-CM | POA: Diagnosis not present

## 2023-03-10 ENCOUNTER — Ambulatory Visit (HOSPITAL_BASED_OUTPATIENT_CLINIC_OR_DEPARTMENT_OTHER)
Admission: RE | Admit: 2023-03-10 | Discharge: 2023-03-10 | Disposition: A | Discharge status: Home / Self Care | Payer: PPO | Source: Ambulatory Visit | Attending: Neurosurgery | Admitting: Neurosurgery

## 2023-03-10 DIAGNOSIS — S066X1A Traumatic subarachnoid hemorrhage with loss of consciousness of 30 minutes or less, initial encounter: Secondary | ICD-10-CM | POA: Diagnosis not present

## 2023-03-10 DIAGNOSIS — R55 Syncope and collapse: Secondary | ICD-10-CM | POA: Diagnosis not present

## 2023-03-18 ENCOUNTER — Encounter: Payer: Self-pay | Admitting: Family Medicine

## 2023-03-18 ENCOUNTER — Ambulatory Visit (INDEPENDENT_AMBULATORY_CARE_PROVIDER_SITE_OTHER): Payer: PPO | Admitting: Family Medicine

## 2023-03-18 VITALS — BP 115/76 | HR 78 | Ht 66.0 in | Wt 187.0 lb

## 2023-03-18 DIAGNOSIS — R413 Other amnesia: Secondary | ICD-10-CM

## 2023-03-18 DIAGNOSIS — H6122 Impacted cerumen, left ear: Secondary | ICD-10-CM | POA: Diagnosis not present

## 2023-03-18 MED ORDER — DILTIAZEM HCL 30 MG PO TABS
30.0000 mg | ORAL_TABLET | Freq: Three times a day (TID) | ORAL | 1 refills | Status: DC | PRN
Start: 2023-03-18 — End: 2023-03-20

## 2023-03-18 MED ORDER — DONEPEZIL HCL 10 MG PO TABS
10.0000 mg | ORAL_TABLET | Freq: Every day | ORAL | 1 refills | Status: AC
Start: 2023-03-18 — End: ?

## 2023-03-18 NOTE — Progress Notes (Signed)
BP 115/76   Pulse 78   Ht 5\' 6"  (1.676 m)   Wt 187 lb (84.8 kg)   SpO2 97%   BMI 30.18 kg/m    Subjective:   Patient ID: Ethan Peters, male    DOB: December 26, 1955, 67 y.o.   MRN: [id number]  HPI: Ethan Peters is a 67 y.o. male presenting on 03/18/2023 for Foreign Body in Ear (left)   HPI Left ear stopped up and feels like something is in there Patient was trying to clean out his ears a couple days ago and felt something in there and feels like it is plugged up and cannot hear out of it.  He denies any pain or fevers or chills.  He also feels like he has a little bit of fluid or liquid in his right ear as well but it does not feel the same as the other 1 where it is completely plugged up on the left ear.  He also feels like he is having some memory issues and would like to try something for memory.  Relevant past medical, surgical, family and social history reviewed and updated as indicated. Interim medical history since our last visit reviewed. Allergies and medications reviewed and updated.  Review of Systems  Constitutional:  Negative for chills and fever.  HENT:  Positive for hearing loss. Negative for congestion.   Eyes:  Negative for visual disturbance.  Respiratory:  Negative for cough, shortness of breath and wheezing.   Cardiovascular:  Negative for chest pain and leg swelling.  Musculoskeletal:  Negative for back pain and gait problem.  Skin:  Negative for rash.  Neurological:  Negative for dizziness, weakness and light-headedness.  All other systems reviewed and are negative.   Per HPI unless specifically indicated above   Allergies as of 03/18/2023       Reactions   Levemir [insulin Detemir] Swelling   Lisinopril Cough   Penicillins Rash   Lip swelling        Medication List        Accurate as of March 18, 2023  3:02 PM. If you have any questions, ask your nurse or doctor.          allopurinol 300 MG tablet Commonly known as: ZYLOPRIM TAKE  ONE TABLET BY MOUTH ONCE DAILY AS NEEDED FOR GOUT PREVENTION   aspirin EC 81 MG tablet Take 1 tablet (81 mg total) by mouth daily.   atorvastatin 80 MG tablet Commonly known as: LIPITOR TAKE 1 TABLET BY MOUTH ONCE DAILY AT 6 PM FOR CHOLESTEROL   B-complex with vitamin C tablet Take 1 tablet by mouth as needed (for vitamin deficiency).   brimonidine 0.2 % ophthalmic solution Commonly known as: ALPHAGAN PLACE 1 DROP INTO BOTH EYES 3 (THREE) TIMES DAILY. GIVE 90-DAY SUPPLY AT A TIME   cholecalciferol 25 MCG (1000 UNIT) tablet Commonly known as: VITAMIN D3 Take 1,000 Units by mouth daily.   diltiazem 30 MG tablet Commonly known as: Cardizem Take 1 tablet (30 mg total) by mouth 3 (three) times daily as needed. Only take when blood pressure is greater than 150/90 Started by: Elige Radon Aron Inge, MD   donepezil 10 MG tablet Commonly known as: ARICEPT Take 1 tablet (10 mg total) by mouth at bedtime. Started by: Elige Radon Pryor Guettler, MD   dorzolamide-timolol 2-0.5 % ophthalmic solution Commonly known as: COSOPT 1 DROP BOTH EYES TWICE A DAY. SEPARATE BY AT LEAST 10 MINUTES FROM OTHER PRESSURE REDUCING EYE DROPS What changed:  See the new instructions.   ezetimibe 10 MG tablet Commonly known as: ZETIA Take 1 tablet (10 mg total) by mouth daily.   glipiZIDE 2.5 MG 24 hr tablet Commonly known as: glipiZIDE XL Take 1 tablet (2.5 mg total) by mouth 2 (two) times daily.   Glucerna 1.0 Cal Liqd Take 500 mLs by mouth daily.   ibuprofen 800 MG tablet Commonly known as: ADVIL Take 1 tablet (800 mg total) by mouth 2 (two) times daily as needed.   insulin aspart 100 UNIT/ML injection Commonly known as: novoLOG Inject 20-30 Units into the skin 3 (three) times daily before meals.   insulin aspart protamine- aspart (70-30) 100 UNIT/ML injection Commonly known as: NovoLOG Mix 70/30 Inject 0.24-0.3 mLs (24-30 Units total) into the skin 3 (three) times daily with meals.   latanoprost 0.005  % ophthalmic solution Commonly known as: XALATAN INSTILL 1 DROP IN BOTH EYES EVERY EVENING   levothyroxine 50 MCG tablet Commonly known as: SYNTHROID Take 1 tablet (50 mcg total) by mouth daily.   metFORMIN 1000 MG tablet Commonly known as: GLUCOPHAGE TAKE 1 TABLET (1,000 MG TOTAL) BY MOUTH TWICE A DAY WITH FOOD   mirtazapine 15 MG disintegrating tablet Commonly known as: REMERON SOL-TAB TAKE 1 TABLET BY MOUTH EVERYDAY AT BEDTIME   nitroGLYCERIN 0.4 MG SL tablet Commonly known as: NITROSTAT Place 1 tablet (0.4 mg total) under the tongue every 5 (five) minutes x 3 doses as needed for chest pain.   OneTouch Verio test strip Generic drug: glucose blood Test BS 4 times daily Dx Z61.09         Objective:   BP 115/76   Pulse 78   Ht 5\' 6"  (1.676 m)   Wt 187 lb (84.8 kg)   SpO2 97%   BMI 30.18 kg/m   Wt Readings from Last 3 Encounters:  03/18/23 187 lb (84.8 kg)  01/30/23 172 lb (78 kg)  01/29/23 168 lb (76.2 kg)    Physical Exam Vitals and nursing note reviewed.  Constitutional:      General: He is not in acute distress.    Appearance: He is well-developed. He is not diaphoretic.  HENT:     Right Ear: No middle ear effusion. There is no impacted cerumen. No foreign body. Tympanic membrane is not injected, erythematous or retracted.     Left Ear: There is impacted cerumen. No foreign body.  Eyes:     General: No scleral icterus.    Conjunctiva/sclera: Conjunctivae normal.  Neck:     Thyroid: No thyromegaly.  Musculoskeletal:        General: Normal range of motion.     Cervical back: Neck supple.  Lymphadenopathy:     Cervical: No cervical adenopathy.  Skin:    General: Skin is warm and dry.     Findings: No rash.  Neurological:     Mental Status: He is alert and oriented to person, place, and time.     Coordination: Coordination normal.  Psychiatric:        Behavior: Behavior normal.       Assessment & Plan:   Problem List Items Addressed This  Visit   None Visit Diagnoses     Hearing loss due to cerumen impaction, left    -  Primary   Memory changes       Relevant Medications   donepezil (ARICEPT) 10 MG tablet     He has an appointment with neurology follow-up with memory and other issues but would  like to try something to help with memory.  Nurse to lavage cerumen, patient tolerated well.  Follow up plan: Return if symptoms worsen or fail to improve.  Counseling provided for all of the vaccine components No orders of the defined types were placed in this encounter.   Arville Care, MD [facility] 03/18/2023, 3:02 PM

## 2023-03-20 ENCOUNTER — Other Ambulatory Visit: Payer: Self-pay | Admitting: Family Medicine

## 2023-03-25 ENCOUNTER — Encounter: Payer: Self-pay | Admitting: *Deleted

## 2023-04-01 DIAGNOSIS — Z6829 Body mass index (BMI) 29.0-29.9, adult: Secondary | ICD-10-CM | POA: Diagnosis not present

## 2023-04-01 DIAGNOSIS — I6203 Nontraumatic chronic subdural hemorrhage: Secondary | ICD-10-CM | POA: Diagnosis not present

## 2023-04-09 ENCOUNTER — Other Ambulatory Visit: Payer: Self-pay | Admitting: Family Medicine
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
## 2023-04-12 ENCOUNTER — Encounter: Payer: Self-pay | Admitting: Cardiology

## 2023-04-12 ENCOUNTER — Ambulatory Visit: Payer: PPO | Attending: Cardiology | Admitting: Cardiology

## 2023-04-12 VITALS — BP 132/80 | HR 77 | Ht 66.0 in | Wt 186.6 lb

## 2023-04-12 DIAGNOSIS — I25118 Atherosclerotic heart disease of native coronary artery with other forms of angina pectoris: Secondary | ICD-10-CM

## 2023-04-12 DIAGNOSIS — E782 Mixed hyperlipidemia: Secondary | ICD-10-CM | POA: Diagnosis not present

## 2023-04-12 DIAGNOSIS — I10 Essential (primary) hypertension: Secondary | ICD-10-CM

## 2023-04-12 NOTE — Patient Instructions (Signed)
Medication Instructions:  Continue same medications *If you need a refill on your cardiac medications before your next appointment, please call your pharmacy*   Lab Work: None ordered   Testing/Procedures: None ordered   Follow-Up: At [facility], you and your health needs are our priority.  As part of our continuing mission to provide you with exceptional heart care, we have created designated Provider Care Teams.  These Care Teams include your primary Cardiologist (physician) and Advanced Practice Providers (APPs -  Physician Assistants and Nurse Practitioners) who all work together to provide you with the care you need, when you need it.  We recommend signing up for the patient portal called "MyChart".  Sign up information is provided on this After Visit Summary.  MyChart is used to connect with patients for Virtual Visits (Telemedicine).  Patients are able to view lab/test results, encounter notes, upcoming appointments, etc.  Non-urgent messages can be sent to your provider as well.   To learn more about what you can do with MyChart, go to [email or web address].    Your next appointment:  6 months    Call in Sept to schedule Jan appointment     Provider:  Dr.Jordan   

## 2023-04-16 ENCOUNTER — Other Ambulatory Visit: Payer: Self-pay | Admitting: Cardiology

## 2023-04-24 ENCOUNTER — Ambulatory Visit (INDEPENDENT_AMBULATORY_CARE_PROVIDER_SITE_OTHER): Payer: PPO | Admitting: Family Medicine

## 2023-04-24 ENCOUNTER — Encounter: Payer: Self-pay | Admitting: Family Medicine

## 2023-04-24 VITALS — BP 117/71 | HR 66 | Ht 66.0 in | Wt 184.0 lb

## 2023-04-24 DIAGNOSIS — F5104 Psychophysiologic insomnia: Secondary | ICD-10-CM

## 2023-04-24 DIAGNOSIS — E039 Hypothyroidism, unspecified: Secondary | ICD-10-CM

## 2023-04-24 DIAGNOSIS — E1159 Type 2 diabetes mellitus with other circulatory complications: Secondary | ICD-10-CM

## 2023-04-24 DIAGNOSIS — E11319 Type 2 diabetes mellitus with unspecified diabetic retinopathy without macular edema: Secondary | ICD-10-CM | POA: Diagnosis not present

## 2023-04-24 DIAGNOSIS — E782 Mixed hyperlipidemia: Secondary | ICD-10-CM | POA: Diagnosis not present

## 2023-04-24 DIAGNOSIS — I152 Hypertension secondary to endocrine disorders: Secondary | ICD-10-CM

## 2023-04-24 DIAGNOSIS — Z7984 Long term (current) use of oral hypoglycemic drugs: Secondary | ICD-10-CM | POA: Diagnosis not present

## 2023-04-24 DIAGNOSIS — I10 Essential (primary) hypertension: Secondary | ICD-10-CM

## 2023-04-24 LAB — BAYER DCA HB A1C WAIVED: HB A1C (BAYER DCA - WAIVED): 7.2 % — ABNORMAL HIGH (ref 4.8–5.6)

## 2023-04-24 MED ORDER — CANDESARTAN CILEXETIL 8 MG PO TABS
8.0000 mg | ORAL_TABLET | Freq: Two times a day (BID) | ORAL | 3 refills | Status: DC
Start: 2023-04-24 — End: 2024-04-20

## 2023-04-24 MED ORDER — ALLOPURINOL 300 MG PO TABS: 300.0000 mg | ORAL_TABLET | Freq: Every day | ORAL | 3 refills | Status: DC

## 2023-04-24 MED ORDER — METFORMIN HCL 1000 MG PO TABS: 1000.0000 mg | ORAL_TABLET | Freq: Two times a day (BID) | ORAL | 3 refills | Status: DC

## 2023-04-24 MED ORDER — MIRTAZAPINE 15 MG PO TBDP: 15.0000 mg | ORAL_TABLET | Freq: Every day | ORAL | 3 refills | Status: DC

## 2023-04-24 MED ORDER — ATORVASTATIN CALCIUM 80 MG PO TABS: 80.0000 mg | ORAL_TABLET | Freq: Every day | ORAL | 3 refills | Status: DC

## 2023-04-24 NOTE — Progress Notes (Signed)
BP 117/71   Pulse 66   Ht 5\' 6"  (1.676 m)   Wt 184 lb (83.5 kg)   SpO2 94%   BMI 29.70 kg/m    Subjective:   Patient ID: Ethan Peters, male    DOB: 31-Jul-1956, 67 y.o.   MRN: [id number]  HPI: Ethan Peters is a 67 y.o. male presenting on 04/24/2023 for Medical Management of Chronic Issues, Hyperlipidemia, Hypertension, Nausea (When taking Diltiazem), and Headache   HPI Hypertension Patient is currently on diltiazem as needed but he says it made him feel nauseated and given flushing and headaches, and their blood pressure today is 117/71. Patient denies any lightheadedness or dizziness. Patient denies headaches, blurred vision, chest pains, shortness of breath, or weakness. Denies any side effects from medication and is content with current medication.   Type 2 diabetes mellitus Patient comes in today for recheck of his diabetes. Patient has been currently taking NovoLog and NovoLog 70/30 and glipizide and metformin. Patient is currently on an ACE inhibitor/ARB. Patient has not seen an ophthalmologist this year. Patient denies any new issues with their feet. The symptom started onset as an adult hypertension and hyperlipidemia and hypothyroidism and CAD ARE RELATED TO DM   Hyperlipidemia Patient is coming in for recheck of his hyperlipidemia. The patient is currently taking atorvastatin. They deny any issues with myalgias or history of liver damage from it. They deny any focal numbness or weakness or chest pain.   Hypothyroidism recheck Patient is coming in for thyroid recheck today as well. They deny any issues with hair changes or heat or cold problems or diarrhea or constipation. They deny any chest pain or palpitations. They are currently on levothyroxine 50 micrograms   Relevant past medical, surgical, family and social history reviewed and updated as indicated. Interim medical history since our last visit reviewed. Allergies and medications reviewed and updated.  Review  of Systems  Constitutional:  Negative for chills and fever.  Eyes:  Positive for visual disturbance.  Respiratory:  Negative for shortness of breath and wheezing.   Cardiovascular:  Negative for chest pain and leg swelling.  Musculoskeletal:  Negative for back pain and gait problem.  Skin:  Negative for rash.  Neurological:  Positive for headaches. Negative for dizziness and light-headedness.  All other systems reviewed and are negative.   Per HPI unless specifically indicated above   Allergies as of 04/24/2023       Reactions   Levemir [insulin Detemir] Swelling   Lisinopril Cough   Penicillins Rash   Lip swelling        Medication List        Accurate as of April 24, 2023  9:19 AM. If you have any questions, ask your nurse or doctor.          allopurinol 300 MG tablet Commonly known as: ZYLOPRIM Take 1 tablet (300 mg total) by mouth daily. What changed: See the new instructions. Changed by: Nils Pyle, MD   aspirin EC 81 MG tablet Take 1 tablet (81 mg total) by mouth daily.   atorvastatin 80 MG tablet Commonly known as: LIPITOR Take 1 tablet (80 mg total) by mouth daily. What changed: See the new instructions. Changed by: Elige Radon Zan Triska, MD   B-complex with vitamin C tablet Take 1 tablet by mouth as needed (for vitamin deficiency).   brimonidine 0.2 % ophthalmic solution Commonly known as: ALPHAGAN PLACE 1 DROP INTO BOTH EYES 3 (THREE) TIMES DAILY. GIVE 90-DAY SUPPLY  AT A TIME   candesartan 8 MG tablet Commonly known as: ATACAND Take 1 tablet (8 mg total) by mouth 2 (two) times daily. Started by: Nils Pyle, MD   cholecalciferol 25 MCG (1000 UNIT) tablet Commonly known as: VITAMIN D3 Take 1,000 Units by mouth daily.   diltiazem 30 MG tablet Commonly known as: CARDIZEM TAKE 1 TABLET 3 TIMES DAILY AS NEEDED ONLY WHEN BLOOD PRESSURE IS GREATER THAN 150/90   donepezil 10 MG tablet Commonly known as: ARICEPT Take 1 tablet (10 mg  total) by mouth at bedtime.   dorzolamide-timolol 2-0.5 % ophthalmic solution Commonly known as: COSOPT 1 DROP BOTH EYES TWICE A DAY. SEPARATE BY AT LEAST 10 MINUTES FROM OTHER PRESSURE REDUCING EYE DROPS What changed: See the new instructions.   ezetimibe 10 MG tablet Commonly known as: ZETIA TAKE 1 TABLET BY MOUTH EVERY DAY   glipiZIDE 2.5 MG 24 hr tablet Commonly known as: glipiZIDE XL Take 1 tablet (2.5 mg total) by mouth 2 (two) times daily.   Glucerna 1.0 Cal Liqd Take 500 mLs by mouth daily.   ibuprofen 800 MG tablet Commonly known as: ADVIL Take 1 tablet (800 mg total) by mouth 2 (two) times daily as needed.   insulin aspart 100 UNIT/ML injection Commonly known as: novoLOG Inject 20-30 Units into the skin 3 (three) times daily before meals.   insulin aspart protamine- aspart (70-30) 100 UNIT/ML injection Commonly known as: NovoLOG Mix 70/30 Inject 0.24-0.3 mLs (24-30 Units total) into the skin 3 (three) times daily with meals.   latanoprost 0.005 % ophthalmic solution Commonly known as: XALATAN INSTILL 1 DROP IN BOTH EYES EVERY EVENING   levothyroxine 50 MCG tablet Commonly known as: SYNTHROID Take 1 tablet (50 mcg total) by mouth daily.   metFORMIN 1000 MG tablet Commonly known as: GLUCOPHAGE Take 1 tablet (1,000 mg total) by mouth 2 (two) times daily with a meal. What changed: See the new instructions. Changed by: Elige Radon Nyles Mitton, MD   mirtazapine 15 MG disintegrating tablet Commonly known as: REMERON SOL-TAB Take 1 tablet (15 mg total) by mouth at bedtime. What changed: See the new instructions. Changed by: Elige Radon Isac Lincks, MD   nitroGLYCERIN 0.4 MG SL tablet Commonly known as: NITROSTAT Place 1 tablet (0.4 mg total) under the tongue every 5 (five) minutes x 3 doses as needed for chest pain.   OneTouch Verio test strip Generic drug: glucose blood Test BS 4 times daily Dx Z61.09         Objective:   BP 117/71   Pulse 66   Ht 5\' 6"   (1.676 m)   Wt 184 lb (83.5 kg)   SpO2 94%   BMI 29.70 kg/m   Wt Readings from Last 3 Encounters:  04/24/23 184 lb (83.5 kg)  04/12/23 186 lb 9.6 oz (84.6 kg)  03/18/23 187 lb (84.8 kg)    Physical Exam Vitals and nursing note reviewed.  Constitutional:      General: He is not in acute distress.    Appearance: He is well-developed. He is not diaphoretic.  Eyes:     General: No scleral icterus.       Right eye: No discharge.     Conjunctiva/sclera: Conjunctivae normal.     Pupils: Pupils are equal, round, and reactive to light.  Neck:     Thyroid: No thyromegaly.  Cardiovascular:     Rate and Rhythm: Normal rate and regular rhythm.     Heart sounds: Normal heart sounds. No  murmur heard. Pulmonary:     Effort: Pulmonary effort is normal. No respiratory distress.     Breath sounds: Normal breath sounds. No wheezing.  Musculoskeletal:        General: Normal range of motion.     Cervical back: Neck supple.  Lymphadenopathy:     Cervical: No cervical adenopathy.  Skin:    General: Skin is warm and dry.     Findings: No rash.  Neurological:     Mental Status: He is alert and oriented to person, place, and time.     Coordination: Coordination normal.  Psychiatric:        Behavior: Behavior normal.       Assessment & Plan:   Problem List Items Addressed This Visit       Cardiovascular and Mediastinum   DM type 2 causing vascular disease (HCC) - Primary   Relevant Medications   atorvastatin (LIPITOR) 80 MG tablet   metFORMIN (GLUCOPHAGE) 1000 MG tablet   candesartan (ATACAND) 8 MG tablet   Other Relevant Orders   Bayer DCA Hb A1c Waived   CBC with Differential/Platelet   Essential hypertension   Relevant Medications   atorvastatin (LIPITOR) 80 MG tablet   candesartan (ATACAND) 8 MG tablet     Endocrine   Hypothyroidism   Diabetic retinopathy of both eyes associated with type 2 diabetes mellitus (HCC)   Relevant Medications   atorvastatin (LIPITOR) 80 MG  tablet   metFORMIN (GLUCOPHAGE) 1000 MG tablet   candesartan (ATACAND) 8 MG tablet     Other   Mixed hyperlipidemia   Relevant Medications   atorvastatin (LIPITOR) 80 MG tablet   candesartan (ATACAND) 8 MG tablet   Other Visit Diagnoses     Psychophysiological insomnia       Relevant Medications   mirtazapine (REMERON SOL-TAB) 15 MG disintegrating tablet     Gave him candesartan, the lower dose that he can use twice a day and see if that is enough to help with blood pressure and symptoms, for now we will stop the diltiazem which just use as needed.  A1c 7.2, mainly focus on diet.  And getting back back to exercise.  Follow up plan: Return in about 3 months (around 07/25/2023), or if symptoms worsen or fail to improve, for Hypertension and cholesterol recheck diabetes.  Counseling provided for all of the vaccine components Orders Placed This Encounter  Procedures   Bayer DCA Hb A1c Waived   CBC with Differential/Platelet    Arville Care, MD [facility] 04/24/2023, 9:19 AM

## 2023-04-25 ENCOUNTER — Other Ambulatory Visit: Payer: Self-pay | Admitting: Family Medicine

## 2023-04-25 LAB — CBC WITH DIFFERENTIAL/PLATELET
Basophils Absolute: 0.1 10*3/uL (ref 0.0–0.2)
Basos: 1 %
EOS (ABSOLUTE): 0.2 10*3/uL (ref 0.0–0.4)
Eos: 3 %
Hematocrit: 39.1 % (ref 37.5–51.0)
Hemoglobin: 13 g/dL (ref 13.0–17.7)
Immature Grans (Abs): 0 10*3/uL (ref 0.0–0.1)
Immature Granulocytes: 0 %
Lymphocytes Absolute: 3 10*3/uL (ref 0.7–3.1)
Lymphs: 40 %
MCH: 30.9 pg (ref 26.6–33.0)
MCHC: 33.2 g/dL (ref 31.5–35.7)
MCV: 93 fL (ref 79–97)
Monocytes Absolute: 0.6 10*3/uL (ref 0.1–0.9)
Monocytes: 8 %
Neutrophils Absolute: 3.6 10*3/uL (ref 1.4–7.0)
Neutrophils: 48 %
Platelets: 230 10*3/uL (ref 150–450)
RBC: 4.21 x10E6/uL (ref 4.14–5.80)
RDW: 12.9 % (ref 11.6–15.4)
WBC: 7.5 10*3/uL (ref 3.4–10.8)

## 2023-05-06 ENCOUNTER — Ambulatory Visit: Payer: PPO | Admitting: Family Medicine

## 2023-05-23 ENCOUNTER — Ambulatory Visit: Payer: PPO | Admitting: Family Medicine

## 2023-06-25 ENCOUNTER — Telehealth: Payer: Self-pay | Admitting: Family Medicine

## 2023-07-01 ENCOUNTER — Other Ambulatory Visit (HOSPITAL_COMMUNITY): Payer: Self-pay | Admitting: Neurosurgery

## 2023-07-01 DIAGNOSIS — I6203 Nontraumatic chronic subdural hemorrhage: Secondary | ICD-10-CM | POA: Diagnosis not present

## 2023-07-01 DIAGNOSIS — S065XAA Traumatic subdural hemorrhage with loss of consciousness status unknown, initial encounter: Secondary | ICD-10-CM

## 2023-07-04 ENCOUNTER — Ambulatory Visit (INDEPENDENT_AMBULATORY_CARE_PROVIDER_SITE_OTHER): Payer: PPO

## 2023-07-04 DIAGNOSIS — E1159 Type 2 diabetes mellitus with other circulatory complications: Secondary | ICD-10-CM

## 2023-07-04 LAB — HM DIABETES EYE EXAM

## 2023-07-04 NOTE — Progress Notes (Signed)
Ethan Peters arrived 07/04/2023 and has given verbal consent to obtain images and complete their overdue diabetic retinal screening.  The images have been sent to an ophthalmologist or optometrist for review and interpretation.  Results will be sent back to Dettinger, Elige Radon, MD for review.  Patient has been informed they will be contacted when we receive the results via telephone or MyChart

## 2023-07-10 NOTE — Progress Notes (Signed)
Patient is blind so this does not surprise me

## 2023-07-10 NOTE — Progress Notes (Signed)
Retinal screening unreadable.

## 2023-07-13 ENCOUNTER — Ambulatory Visit (HOSPITAL_BASED_OUTPATIENT_CLINIC_OR_DEPARTMENT_OTHER)
Admission: RE | Admit: 2023-07-13 | Discharge: 2023-07-13 | Disposition: A | Discharge status: Home / Self Care | Payer: PPO | Source: Ambulatory Visit | Attending: Neurosurgery | Admitting: Neurosurgery

## 2023-07-13 DIAGNOSIS — I6782 Cerebral ischemia: Secondary | ICD-10-CM | POA: Diagnosis not present

## 2023-07-13 DIAGNOSIS — S065XAA Traumatic subdural hemorrhage with loss of consciousness status unknown, initial encounter: Secondary | ICD-10-CM | POA: Insufficient documentation

## 2023-07-13 DIAGNOSIS — G9600 Cerebrospinal fluid leak, unspecified: Secondary | ICD-10-CM | POA: Diagnosis not present

## 2023-07-13 DIAGNOSIS — I6200 Nontraumatic subdural hemorrhage, unspecified: Secondary | ICD-10-CM | POA: Diagnosis not present

## 2023-07-14 ENCOUNTER — Other Ambulatory Visit: Payer: Self-pay | Admitting: Family Medicine

## 2023-07-14 DIAGNOSIS — R413 Other amnesia: Secondary | ICD-10-CM

## 2023-07-14 DIAGNOSIS — E1159 Type 2 diabetes mellitus with other circulatory complications: Secondary | ICD-10-CM

## 2023-07-14 DIAGNOSIS — M109 Gout, unspecified: Secondary | ICD-10-CM

## 2023-07-14 DIAGNOSIS — E1342 Other specified diabetes mellitus with diabetic polyneuropathy: Secondary | ICD-10-CM

## 2023-07-14 DIAGNOSIS — E11319 Type 2 diabetes mellitus with unspecified diabetic retinopathy without macular edema: Secondary | ICD-10-CM

## 2023-07-15 DIAGNOSIS — Z125 Encounter for screening for malignant neoplasm of prostate: Secondary | ICD-10-CM | POA: Diagnosis not present

## 2023-07-15 DIAGNOSIS — N5201 Erectile dysfunction due to arterial insufficiency: Secondary | ICD-10-CM | POA: Diagnosis not present

## 2023-07-26 ENCOUNTER — Encounter: Payer: Self-pay | Admitting: Family Medicine

## 2023-07-26 ENCOUNTER — Ambulatory Visit: Payer: PPO | Admitting: Family Medicine

## 2023-07-26 VITALS — BP 145/78 | HR 60 | Ht 66.0 in | Wt 191.0 lb

## 2023-07-26 DIAGNOSIS — Z23 Encounter for immunization: Secondary | ICD-10-CM

## 2023-07-26 DIAGNOSIS — E1342 Other specified diabetes mellitus with diabetic polyneuropathy: Secondary | ICD-10-CM

## 2023-07-26 DIAGNOSIS — E1169 Type 2 diabetes mellitus with other specified complication: Secondary | ICD-10-CM | POA: Diagnosis not present

## 2023-07-26 DIAGNOSIS — E039 Hypothyroidism, unspecified: Secondary | ICD-10-CM | POA: Diagnosis not present

## 2023-07-26 DIAGNOSIS — M109 Gout, unspecified: Secondary | ICD-10-CM | POA: Diagnosis not present

## 2023-07-26 DIAGNOSIS — I10 Essential (primary) hypertension: Secondary | ICD-10-CM | POA: Diagnosis not present

## 2023-07-26 DIAGNOSIS — E11319 Type 2 diabetes mellitus with unspecified diabetic retinopathy without macular edema: Secondary | ICD-10-CM

## 2023-07-26 DIAGNOSIS — R413 Other amnesia: Secondary | ICD-10-CM

## 2023-07-26 DIAGNOSIS — Z7984 Long term (current) use of oral hypoglycemic drugs: Secondary | ICD-10-CM | POA: Diagnosis not present

## 2023-07-26 DIAGNOSIS — E782 Mixed hyperlipidemia: Secondary | ICD-10-CM

## 2023-07-26 DIAGNOSIS — E1159 Type 2 diabetes mellitus with other circulatory complications: Secondary | ICD-10-CM

## 2023-07-26 LAB — BAYER DCA HB A1C WAIVED: HB A1C (BAYER DCA - WAIVED): 7.2 % — ABNORMAL HIGH (ref 4.8–5.6)

## 2023-07-26 MED ORDER — INSULIN ASPART PROT & ASPART (70-30 MIX) 100 UNIT/ML ~~LOC~~ SUSP
35.0000 [IU] | Freq: Two times a day (BID) | SUBCUTANEOUS | 3 refills | Status: DC
Start: 2023-07-26 — End: 2023-07-26

## 2023-07-26 MED ORDER — DILTIAZEM HCL 30 MG PO TABS
30.0000 mg | ORAL_TABLET | Freq: Three times a day (TID) | ORAL | 1 refills | Status: DC | PRN
Start: 2023-07-26 — End: 2024-02-07

## 2023-07-26 MED ORDER — INSULIN ASPART PROT & ASPART (70-30 MIX) 100 UNIT/ML ~~LOC~~ SUSP
35.0000 [IU] | Freq: Three times a day (TID) | SUBCUTANEOUS | 3 refills | Status: DC
Start: 2023-07-26 — End: 2024-02-07

## 2023-07-26 MED ORDER — BRIMONIDINE TARTRATE 0.2 % OP SOLN: 1.0000 [drp] | Freq: Three times a day (TID) | OPHTHALMIC | 3 refills | Status: AC

## 2023-07-26 MED ORDER — DONEPEZIL HCL 10 MG PO TABS: 10.0000 mg | ORAL_TABLET | Freq: Every day | ORAL | 3 refills | Status: DC

## 2023-07-26 MED ORDER — GLIPIZIDE ER 2.5 MG PO TB24
2.5000 mg | ORAL_TABLET | Freq: Two times a day (BID) | ORAL | 3 refills | Status: DC
Start: 2023-07-26 — End: 2024-09-04

## 2023-07-26 MED ORDER — IBUPROFEN 800 MG PO TABS
800.0000 mg | ORAL_TABLET | Freq: Two times a day (BID) | ORAL | 3 refills | Status: DC | PRN
Start: 2023-07-26 — End: 2024-08-10

## 2023-07-26 NOTE — Progress Notes (Signed)
BP (!) 145/78   Pulse 60   Ht 5\' 6"  (1.676 m)   Wt 191 lb (86.6 kg)   SpO2 95%   BMI 30.83 kg/m    Subjective:   Patient ID: Ethan Peters, male    DOB: 1956/06/17, 67 y.o.   MRN: [id number]  HPI: Ethan Peters is a 67 y.o. male presenting on 07/26/2023 for Medical Management of Chronic Issues, Hypertension, Hyperlipidemia, and Diabetes   HPI Type 2 diabetes mellitus Patient comes in today for recheck of his diabetes. Patient has been currently taking NovoLog 70/30, takes 30 5 in the AM 40 at lunch and 30 in the evening. Patient is currently on an ACE inhibitor/ARB. Patient has seen an ophthalmologist this year. Patient denies any new issues with their feet. The symptom started onset as an adult hypertension and hyperlipidemia ARE RELATED TO DM   Hypertension Patient is currently on diltiazem and candesartan, and their blood pressure today is 145/78 but he says has been running good at 124/72 at home. Patient denies any lightheadedness or dizziness. Patient denies headaches, blurred vision, chest pains, shortness of breath, or weakness. Denies any side effects from medication and is content with current medication.   Hyperlipidemia Patient is coming in for recheck of his hyperlipidemia. The patient is currently taking Zetia and atorvastatin. They deny any issues with myalgias or history of liver damage from it. They deny any focal numbness or weakness or chest pain.   Hypothyroidism recheck Patient is coming in for thyroid recheck today as well. They deny any issues with hair changes or heat or cold problems or diarrhea or constipation. They deny any chest pain or palpitations. They are currently on levothyroxine 50 micrograms   Relevant past medical, surgical, family and social history reviewed and updated as indicated. Interim medical history since our last visit reviewed. Allergies and medications reviewed and updated.  Review of Systems  Constitutional:  Negative for  chills and fever.  Eyes:  Positive for visual disturbance.  Respiratory:  Negative for shortness of breath and wheezing.   Cardiovascular:  Negative for chest pain and leg swelling.  Musculoskeletal:  Negative for back pain and gait problem.  Skin:  Negative for rash.  Neurological:  Negative for dizziness, light-headedness and numbness.  All other systems reviewed and are negative.   Per HPI unless specifically indicated above   Allergies as of 07/26/2023       Reactions   Levemir [insulin Detemir] Swelling   Lisinopril Cough   Penicillins Rash   Lip swelling        Medication List        Accurate as of July 26, 2023 10:24 AM. If you have any questions, ask your nurse or doctor.          STOP taking these medications    insulin aspart 100 UNIT/ML injection Commonly known as: novoLOG Stopped by: Elige Radon Roshell Brigham       TAKE these medications    allopurinol 300 MG tablet Commonly known as: ZYLOPRIM Take 1 tablet (300 mg total) by mouth daily.   aspirin EC 81 MG tablet Take 1 tablet (81 mg total) by mouth daily.   atorvastatin 80 MG tablet Commonly known as: LIPITOR Take 1 tablet (80 mg total) by mouth daily.   B-complex with vitamin C tablet Take 1 tablet by mouth as needed (for vitamin deficiency).   brimonidine 0.2 % ophthalmic solution Commonly known as: ALPHAGAN Place 1 drop into both eyes 3 (  three) times daily. Give 90-day supply at a time   candesartan 8 MG tablet Commonly known as: ATACAND Take 1 tablet (8 mg total) by mouth 2 (two) times daily.   cholecalciferol 25 MCG (1000 UNIT) tablet Commonly known as: VITAMIN D3 Take 1,000 Units by mouth daily.   diltiazem 30 MG tablet Commonly known as: CARDIZEM Take 1 tablet (30 mg total) by mouth 3 (three) times daily as needed (When blood pressure greater than 150/90). What changed: See the new instructions. Changed by: Elige Radon Cherrish Vitali   donepezil 10 MG tablet Commonly known as:  ARICEPT Take 1 tablet (10 mg total) by mouth at bedtime. What changed: See the new instructions. Changed by: Elige Radon Vaden Becherer   dorzolamide-timolol 2-0.5 % ophthalmic solution Commonly known as: COSOPT 1 DROP BOTH EYES TWICE A DAY. SEPARATE BY AT LEAST 10 MINUTES FROM OTHER PRESSURE REDUCING EYE DROPS What changed: See the new instructions.   ezetimibe 10 MG tablet Commonly known as: ZETIA TAKE 1 TABLET BY MOUTH EVERY DAY   glipiZIDE 2.5 MG 24 hr tablet Commonly known as: GLUCOTROL XL Take 1 tablet (2.5 mg total) by mouth 2 (two) times daily.   Glucerna 1.0 Cal Liqd Take 500 mLs by mouth daily.   ibuprofen 800 MG tablet Commonly known as: ADVIL Take 1 tablet (800 mg total) by mouth 2 (two) times daily as needed. What changed: See the new instructions. Changed by: Elige Radon Laurie Lovejoy   insulin aspart protamine- aspart (70-30) 100 UNIT/ML injection Commonly known as: NovoLOG Mix 70/30 Inject 0.35-0.45 mLs (35-45 Units total) into the skin 3 (three) times daily before meals. What changed:  how much to take when to take this Changed by: Elige Radon Josalyn Dettmann   latanoprost 0.005 % ophthalmic solution Commonly known as: XALATAN INSTILL 1 DROP IN BOTH EYES EVERY EVENING   levothyroxine 50 MCG tablet Commonly known as: SYNTHROID Take 1 tablet (50 mcg total) by mouth daily.   metFORMIN 1000 MG tablet Commonly known as: GLUCOPHAGE Take 1 tablet (1,000 mg total) by mouth 2 (two) times daily with a meal.   mirtazapine 15 MG disintegrating tablet Commonly known as: REMERON SOL-TAB Take 1 tablet (15 mg total) by mouth at bedtime.   nitroGLYCERIN 0.4 MG SL tablet Commonly known as: NITROSTAT Place 1 tablet (0.4 mg total) under the tongue every 5 (five) minutes x 3 doses as needed for chest pain.   OneTouch Verio test strip Generic drug: glucose blood TEST BLOOD SUGAR 4 TIMES DAILY DX E13.42         Objective:   BP (!) 145/78   Pulse 60   Ht 5\' 6"  (1.676 m)   Wt 191  lb (86.6 kg)   SpO2 95%   BMI 30.83 kg/m   Wt Readings from Last 3 Encounters:  07/26/23 191 lb (86.6 kg)  04/24/23 184 lb (83.5 kg)  04/12/23 186 lb 9.6 oz (84.6 kg)    Physical Exam Vitals and nursing note reviewed.  Constitutional:      General: He is not in acute distress.    Appearance: He is well-developed. He is not diaphoretic.  Eyes:     General: No scleral icterus.    Conjunctiva/sclera: Conjunctivae normal.  Neck:     Thyroid: No thyromegaly.  Cardiovascular:     Rate and Rhythm: Normal rate and regular rhythm.     Heart sounds: Normal heart sounds. No murmur heard. Pulmonary:     Effort: Pulmonary effort is normal. No respiratory distress.  Breath sounds: Normal breath sounds. No wheezing.  Musculoskeletal:        General: Normal range of motion.     Cervical back: Neck supple.  Lymphadenopathy:     Cervical: No cervical adenopathy.  Skin:    General: Skin is warm and dry.     Findings: No rash.  Neurological:     Mental Status: He is alert and oriented to person, place, and time.     Coordination: Coordination normal.  Psychiatric:        Behavior: Behavior normal.       Assessment & Plan:   Problem List Items Addressed This Visit       Cardiovascular and Mediastinum   DM type 2 causing vascular disease (HCC) - Primary   Relevant Medications   diltiazem (CARDIZEM) 30 MG tablet   glipiZIDE (GLUCOTROL XL) 2.5 MG 24 hr tablet   insulin aspart protamine- aspart (NOVOLOG MIX 70/30) (70-30) 100 UNIT/ML injection   Other Relevant Orders   CBC with Differential/Platelet   CMP14+EGFR   Lipid panel   TSH   Bayer DCA Hb A1c Waived   PSA, total and free   Essential hypertension   Relevant Medications   diltiazem (CARDIZEM) 30 MG tablet   Other Relevant Orders   CBC with Differential/Platelet   CMP14+EGFR   Lipid panel   TSH   Bayer DCA Hb A1c Waived   PSA, total and free     Endocrine   Diabetic neuropathy (HCC)   Relevant Medications    glipiZIDE (GLUCOTROL XL) 2.5 MG 24 hr tablet   insulin aspart protamine- aspart (NOVOLOG MIX 70/30) (70-30) 100 UNIT/ML injection   Hypothyroidism   Relevant Orders   CBC with Differential/Platelet   CMP14+EGFR   Lipid panel   TSH   Bayer DCA Hb A1c Waived   PSA, total and free   Diabetic retinopathy of both eyes associated with type 2 diabetes mellitus (HCC)   Relevant Medications   glipiZIDE (GLUCOTROL XL) 2.5 MG 24 hr tablet   insulin aspart protamine- aspart (NOVOLOG MIX 70/30) (70-30) 100 UNIT/ML injection     Other   Mixed hyperlipidemia   Relevant Medications   diltiazem (CARDIZEM) 30 MG tablet   Gout   Relevant Medications   ibuprofen (ADVIL) 800 MG tablet   Other Visit Diagnoses     Memory changes       Relevant Medications   donepezil (ARICEPT) 10 MG tablet       A1c is about the same at 7.2.  He is going to focus on diet and he is adjusting his insulins and seems to be doing okay, no medication changes but just focus on that. Follow up plan: Return in about 3 months (around 10/26/2023), or if symptoms worsen or fail to improve, for Diabetes recheck.  Counseling provided for all of the vaccine components Orders Placed This Encounter  Procedures   CBC with Differential/Platelet   CMP14+EGFR   Lipid panel   TSH   Bayer DCA Hb A1c Waived   PSA, total and free    Arville Care, MD [facility] 07/26/2023, 10:24 AM

## 2023-07-26 NOTE — Addendum Note (Signed)
Addended by: Dorene Sorrow on: 07/26/2023 04:33 PM   Modules accepted: Orders

## 2023-07-27 LAB — CMP14+EGFR
ALT: 21 [IU]/L (ref 0–44)
AST: 20 [IU]/L (ref 0–40)
Albumin: 4.7 g/dL (ref 3.9–4.9)
Alkaline Phosphatase: 98 [IU]/L (ref 44–121)
BUN/Creatinine Ratio: 16 (ref 10–24)
BUN: 22 mg/dL (ref 8–27)
Bilirubin Total: 0.5 mg/dL (ref 0.0–1.2)
CO2: 23 mmol/L (ref 20–29)
Calcium: 9.6 mg/dL (ref 8.6–10.2)
Chloride: 103 mmol/L (ref 96–106)
Creatinine, Ser: 1.41 mg/dL — ABNORMAL HIGH (ref 0.76–1.27)
Globulin, Total: 3.1 g/dL (ref 1.5–4.5)
Glucose: 143 mg/dL — ABNORMAL HIGH (ref 70–99)
Potassium: 4.9 mmol/L (ref 3.5–5.2)
Sodium: 141 mmol/L (ref 134–144)
Total Protein: 7.8 g/dL (ref 6.0–8.5)
eGFR: 55 mL/min/{1.73_m2} — ABNORMAL LOW (ref >59)

## 2023-07-27 LAB — CBC WITH DIFFERENTIAL/PLATELET
Basophils Absolute: 0.1 10*3/uL (ref 0.0–0.2)
Basos: 1 %
EOS (ABSOLUTE): 0.3 10*3/uL (ref 0.0–0.4)
Eos: 3 %
Hematocrit: 41.6 % (ref 37.5–51.0)
Hemoglobin: 14.1 g/dL (ref 13.0–17.7)
Immature Grans (Abs): 0 10*3/uL (ref 0.0–0.1)
Immature Granulocytes: 0 %
Lymphocytes Absolute: 3 10*3/uL (ref 0.7–3.1)
Lymphs: 38 %
MCH: 32.1 pg (ref 26.6–33.0)
MCHC: 33.9 g/dL (ref 31.5–35.7)
MCV: 95 fL (ref 79–97)
Monocytes Absolute: 0.5 10*3/uL (ref 0.1–0.9)
Monocytes: 6 %
Neutrophils Absolute: 3.9 10*3/uL (ref 1.4–7.0)
Neutrophils: 52 %
Platelets: 207 10*3/uL (ref 150–450)
RBC: 4.39 x10E6/uL (ref 4.14–5.80)
RDW: 12.9 % (ref 11.6–15.4)
WBC: 7.7 10*3/uL (ref 3.4–10.8)

## 2023-07-27 LAB — PSA, TOTAL AND FREE
PSA, Free Pct: 34.4 %
PSA, Free: 0.31 ng/mL
Prostate Specific Ag, Serum: 0.9 ng/mL (ref 0.0–4.0)

## 2023-07-27 LAB — LIPID PANEL
Chol/HDL Ratio: 3 {ratio} (ref 0.0–5.0)
Cholesterol, Total: 113 mg/dL (ref 100–199)
HDL: 38 mg/dL — ABNORMAL LOW (ref >39)
LDL Chol Calc (NIH): 47 mg/dL (ref 0–99)
Triglycerides: 170 mg/dL — ABNORMAL HIGH (ref 0–149)
VLDL Cholesterol Cal: 28 mg/dL (ref 5–40)

## 2023-07-27 LAB — TSH: TSH: 2.67 u[IU]/mL (ref 0.450–4.500)

## 2023-08-01 NOTE — Addendum Note (Signed)
Addended by: Arville Care on: 08/01/2023 12:30 PM   Modules accepted: Level of Service

## 2023-08-19 ENCOUNTER — Encounter: Payer: Self-pay | Admitting: Family Medicine

## 2023-08-27 ENCOUNTER — Other Ambulatory Visit: Payer: Self-pay | Admitting: Pharmacist

## 2023-08-27 NOTE — Progress Notes (Signed)
Pharmacy Quality Measure Review  This patient is appearing on a report for being at risk of failing the adherence measure for cholesterol (statin) medications this calendar year.   Medication: atorvastatin 80 mg daily Last fill date: 7/10 for 90 day supply  Contacted pharmacy to facilitate refills. Spoke with patient's son. He is unaware of any issues with this medication, but will speak with the patient later this week and call us if any questions or concerns. Confirmed he will pick up refill at the pharmacy.   Catie Eppie Gibson, PharmD, BCACP, CPP Clinical Pharmacist [facility] [phone]

## 2023-09-03 ENCOUNTER — Telehealth: Payer: Self-pay

## 2023-09-03 NOTE — Patient Outreach (Signed)
Attempted to contact patient regarding Colorectal exam, Urine micro. Left voicemail for patient to return my call at [phone].  Nicholes Rough, CMA Care Guide [employer]

## 2023-10-31 ENCOUNTER — Ambulatory Visit (INDEPENDENT_AMBULATORY_CARE_PROVIDER_SITE_OTHER): Payer: PPO | Admitting: Family Medicine

## 2023-10-31 ENCOUNTER — Encounter: Payer: Self-pay | Admitting: Family Medicine

## 2023-10-31 VITALS — BP 112/65 | Ht 66.0 in | Wt 201.0 lb

## 2023-10-31 DIAGNOSIS — I10 Essential (primary) hypertension: Secondary | ICD-10-CM

## 2023-10-31 DIAGNOSIS — Z1211 Encounter for screening for malignant neoplasm of colon: Secondary | ICD-10-CM

## 2023-10-31 DIAGNOSIS — E039 Hypothyroidism, unspecified: Secondary | ICD-10-CM

## 2023-10-31 DIAGNOSIS — E11319 Type 2 diabetes mellitus with unspecified diabetic retinopathy without macular edema: Secondary | ICD-10-CM | POA: Diagnosis not present

## 2023-10-31 DIAGNOSIS — E1159 Type 2 diabetes mellitus with other circulatory complications: Secondary | ICD-10-CM | POA: Diagnosis not present

## 2023-10-31 DIAGNOSIS — E1342 Other specified diabetes mellitus with diabetic polyneuropathy: Secondary | ICD-10-CM

## 2023-10-31 DIAGNOSIS — E782 Mixed hyperlipidemia: Secondary | ICD-10-CM | POA: Diagnosis not present

## 2023-10-31 DIAGNOSIS — Z7984 Long term (current) use of oral hypoglycemic drugs: Secondary | ICD-10-CM | POA: Diagnosis not present

## 2023-10-31 LAB — BAYER DCA HB A1C WAIVED: HB A1C (BAYER DCA - WAIVED): 8.3 % — ABNORMAL HIGH (ref 4.8–5.6)

## 2023-10-31 MED ORDER — NITROGLYCERIN 0.4 MG SL SUBL: 0.4000 mg | SUBLINGUAL_TABLET | SUBLINGUAL | 3 refills | Status: DC | PRN

## 2023-10-31 MED ORDER — LEVOTHYROXINE SODIUM 50 MCG PO TABS: 50.0000 ug | ORAL_TABLET | Freq: Every day | ORAL | 3 refills | Status: DC

## 2023-10-31 MED ORDER — LATANOPROST 0.005 % OP SOLN: 1.0000 [drp] | Freq: Every day | OPHTHALMIC | 3 refills | Status: AC

## 2023-10-31 NOTE — Progress Notes (Signed)
BP 112/65   Ht 5\' 6"  (1.676 m)   Wt 201 lb (91.2 kg)   SpO2 98%   BMI 32.44 kg/m    Subjective:   Patient ID: Ethan Peters, male    DOB: 1956/05/07, 68 y.o.   MRN: [id number]  HPI: Ethan Peters is a 68 y.o. male presenting on 10/31/2023 for Medical Management of Chronic Issues, Diabetes, Hypertension, and Hyperlipidemia   HPI Type 2 diabetes mellitus Patient comes in today for recheck of his diabetes. Patient has been currently taking glipizide and metformin and NovoLog 70/30. Patient is currently on an ACE inhibitor/ARB. Patient has not seen an ophthalmologist this year. Patient denies any new issues with their feet. The symptom started onset as an adult and neuropathy and retinopathy hypertension and hyperlipidemia ARE RELATED TO DM   Hypertension Patient is currently on diltiazem and candesartan, and their blood pressure today is 112/65. Patient denies any lightheadedness or dizziness. Patient denies headaches, blurred vision, chest pains, shortness of breath, or weakness. Denies any side effects from medication and is content with current medication.   Hyperlipidemia Patient is coming in for recheck of his hyperlipidemia. The patient is currently taking atorvastatin and Zetia. They deny any issues with myalgias or history of liver damage from it. They deny any focal numbness or weakness or chest pain.   Relevant past medical, surgical, family and social history reviewed and updated as indicated. Interim medical history since our last visit reviewed. Allergies and medications reviewed and updated.  Review of Systems  Constitutional:  Negative for chills and fever.  Eyes:  Negative for visual disturbance.  Respiratory:  Negative for shortness of breath and wheezing.   Cardiovascular:  Negative for chest pain and leg swelling.  Musculoskeletal:  Negative for back pain and gait problem.  Skin:  Negative for rash.  Neurological:  Negative for dizziness and  light-headedness.  All other systems reviewed and are negative.   Per HPI unless specifically indicated above   Allergies as of 10/31/2023       Reactions   Levemir [insulin Detemir] Swelling   Lisinopril Cough   Penicillins Rash   Lip swelling        Medication List        Accurate as of October 31, 2023 11:41 AM. If you have any questions, ask your nurse or doctor.          STOP taking these medications    Glucerna 1.0 Cal Liqd Stopped by: Elige Radon Allicia Culley   mirtazapine 15 MG disintegrating tablet Commonly known as: REMERON SOL-TAB Stopped by: Elige Radon Jaynell Castagnola       TAKE these medications    allopurinol 300 MG tablet Commonly known as: ZYLOPRIM Take 1 tablet (300 mg total) by mouth daily.   aspirin EC 81 MG tablet Take 1 tablet (81 mg total) by mouth daily.   atorvastatin 80 MG tablet Commonly known as: LIPITOR Take 1 tablet (80 mg total) by mouth daily.   B-complex with vitamin C tablet Take 1 tablet by mouth as needed (for vitamin deficiency).   brimonidine 0.2 % ophthalmic solution Commonly known as: ALPHAGAN Place 1 drop into both eyes 3 (three) times daily. Give 90-day supply at a time   candesartan 8 MG tablet Commonly known as: ATACAND Take 1 tablet (8 mg total) by mouth 2 (two) times daily.   cholecalciferol 25 MCG (1000 UNIT) tablet Commonly known as: VITAMIN D3 Take 1,000 Units by mouth daily.   diltiazem 30  MG tablet Commonly known as: CARDIZEM Take 1 tablet (30 mg total) by mouth 3 (three) times daily as needed (When blood pressure greater than 150/90).   donepezil 10 MG tablet Commonly known as: ARICEPT Take 1 tablet (10 mg total) by mouth at bedtime.   dorzolamide-timolol 2-0.5 % ophthalmic solution Commonly known as: COSOPT 1 DROP BOTH EYES TWICE A DAY. SEPARATE BY AT LEAST 10 MINUTES FROM OTHER PRESSURE REDUCING EYE DROPS What changed: See the new instructions.   ezetimibe 10 MG tablet Commonly known as:  ZETIA TAKE 1 TABLET BY MOUTH EVERY DAY   glipiZIDE 2.5 MG 24 hr tablet Commonly known as: GLUCOTROL XL Take 1 tablet (2.5 mg total) by mouth 2 (two) times daily.   ibuprofen 800 MG tablet Commonly known as: ADVIL Take 1 tablet (800 mg total) by mouth 2 (two) times daily as needed.   insulin aspart protamine- aspart (70-30) 100 UNIT/ML injection Commonly known as: NovoLOG Mix 70/30 Inject 0.35-0.45 mLs (35-45 Units total) into the skin 3 (three) times daily before meals.   latanoprost 0.005 % ophthalmic solution Commonly known as: XALATAN Place 1 drop into both eyes at bedtime. What changed: See the new instructions. Changed by: Elige Radon Naziya Hegwood   levothyroxine 50 MCG tablet Commonly known as: SYNTHROID Take 1 tablet (50 mcg total) by mouth daily.   metFORMIN 1000 MG tablet Commonly known as: GLUCOPHAGE Take 1 tablet (1,000 mg total) by mouth 2 (two) times daily with a meal.   nitroGLYCERIN 0.4 MG SL tablet Commonly known as: NITROSTAT Place 1 tablet (0.4 mg total) under the tongue every 5 (five) minutes x 3 doses as needed for chest pain.   OneTouch Verio test strip Generic drug: glucose blood TEST BLOOD SUGAR 4 TIMES DAILY DX E13.42         Objective:   BP 112/65   Ht 5\' 6"  (1.676 m)   Wt 201 lb (91.2 kg)   SpO2 98%   BMI 32.44 kg/m   Wt Readings from Last 3 Encounters:  10/31/23 201 lb (91.2 kg)  07/26/23 191 lb (86.6 kg)  04/24/23 184 lb (83.5 kg)    Physical Exam Vitals and nursing note reviewed.  Constitutional:      General: He is not in acute distress.    Appearance: He is well-developed. He is not diaphoretic.  Eyes:     General: No scleral icterus.    Conjunctiva/sclera: Conjunctivae normal.  Neck:     Thyroid: No thyromegaly.  Cardiovascular:     Rate and Rhythm: Normal rate and regular rhythm.     Heart sounds: Normal heart sounds. No murmur heard. Pulmonary:     Effort: Pulmonary effort is normal. No respiratory distress.     Breath  sounds: Normal breath sounds. No wheezing.  Musculoskeletal:     Cervical back: Neck supple.  Lymphadenopathy:     Cervical: No cervical adenopathy.  Skin:    General: Skin is warm and dry.     Findings: No rash.  Neurological:     Mental Status: He is alert and oriented to person, place, and time.     Coordination: Coordination normal.  Psychiatric:        Behavior: Behavior normal.     Results for orders placed or performed in visit on 08/19/23  HM DIABETES EYE EXAM   Collection Time: 06/04/22 12:00 AM  Result Value Ref Range   HM Diabetic Eye Exam Retinopathy (A) No Retinopathy    Assessment & Plan:  Problem List Items Addressed This Visit       Cardiovascular and Mediastinum   DM type 2 causing vascular disease (HCC) - Primary   Relevant Medications   nitroGLYCERIN (NITROSTAT) 0.4 MG SL tablet   Other Relevant Orders   Bayer DCA Hb A1c Waived   Microalbumin / creatinine urine ratio   Essential hypertension   Relevant Medications   nitroGLYCERIN (NITROSTAT) 0.4 MG SL tablet     Endocrine   Diabetic neuropathy (HCC)   Hypothyroidism   Relevant Medications   levothyroxine (SYNTHROID) 50 MCG tablet   Diabetic retinopathy of both eyes associated with type 2 diabetes mellitus (HCC)     Other   Mixed hyperlipidemia   Relevant Medications   nitroGLYCERIN (NITROSTAT) 0.4 MG SL tablet   Other Visit Diagnoses       Colon cancer screening       Relevant Orders   Cologuard       A1c is 8.3 which is up.  Instructed him that he needs to increase his insulin to take 50 -3 times daily with meals and keep a close eye for low blood sugars but it sounds like his diet lifestyle has changed significantly.  He is also going to focus on diet. Follow up plan: Return in about 3 months (around 01/29/2024), or if symptoms worsen or fail to improve, for dm and htn and hld.  Counseling provided for all of the vaccine components Orders Placed This Encounter  Procedures   Bayer  DCA Hb A1c Waived   Microalbumin / creatinine urine ratio   Cologuard    Arville Care, MD [facility] 10/31/2023, 11:41 AM

## 2023-11-10 NOTE — Progress Notes (Signed)
Ethan Peters Date of Birth: 12-27-1955 Medical Record #[id number]  History of Present Illness: Ethan Peters is seen for followup of CAD. He is status post inferior STEMI on 01/09/2013. He had stenting of the RCA at the crux. He had persistent chest pain and dyspnea even after his infarct with atypical symptoms. He subsequently underwent repeat cardiac catheterization on April 10,2014 which showed excellent patency of the stent in the RCA. He does have a long 70% stenosis in the left circumflex,  treated medically. He had a Myovew study in September 2017 which showed an inferior scar without ischemia. EF was 48%.  He has a history of poorly controlled diabetes mellitus with retinopathy and neuropathy.   He was seen in the ED in August 2021with progressive left eye blindness. MRI done showed an incidental chronic SDH. Evaluated by Neurosurgery and no further therapy needed. Repeat CT in May 2024 showed stable SDH  He was admitted in April 2023. Had returned from a trip to [place]. Had abdominal pain and nausea. Found to have PNA, UTI and DKA. Also had AKI. Responded to antibiotics, insulin, and hydration. ARB stopped due to hyperkalemia. This was later resumed as outpatient.   On follow up today he is seen with his son. He denies any cardiac complaints. BP at home has been well controlled. Last A1c was up to 8.3. lipids well controlled. No chest pain or dyspnea.    Current Outpatient Medications on File Prior to Visit  Medication Sig Dispense Refill   allopurinol (ZYLOPRIM) 300 MG tablet Take 1 tablet (300 mg total) by mouth daily. 90 tablet 3   aspirin EC 81 MG EC tablet Take 1 tablet (81 mg total) by mouth daily.     atorvastatin (LIPITOR) 80 MG tablet Take 1 tablet (80 mg total) by mouth daily. 90 tablet 3   B Complex-C (B-COMPLEX WITH VITAMIN C) tablet Take 1 tablet by mouth as needed (for vitamin deficiency).     brimonidine (ALPHAGAN) 0.2 % ophthalmic solution Place 1 drop into both eyes 3  (three) times daily. Give 90-day supply at a time 30 mL 3   candesartan (ATACAND) 8 MG tablet Take 1 tablet (8 mg total) by mouth 2 (two) times daily. 180 tablet 3   cholecalciferol (VITAMIN D3) 25 MCG (1000 UNIT) tablet Take 1,000 Units by mouth daily.     diltiazem (CARDIZEM) 30 MG tablet Take 1 tablet (30 mg total) by mouth 3 (three) times daily as needed (When blood pressure greater than 150/90). 270 tablet 1   donepezil (ARICEPT) 10 MG tablet Take 1 tablet (10 mg total) by mouth at bedtime. 90 tablet 3   dorzolamide-timolol (COSOPT) 22.3-6.8 MG/ML ophthalmic solution 1 DROP BOTH EYES TWICE A DAY. SEPARATE BY AT LEAST 10 MINUTES FROM OTHER PRESSURE REDUCING EYE DROPS (Patient taking differently: Place 1 drop into both eyes 2 (two) times daily.) 20 mL 5   ezetimibe (ZETIA) 10 MG tablet TAKE 1 TABLET BY MOUTH EVERY DAY 90 tablet 3   glipiZIDE (GLUCOTROL XL) 2.5 MG 24 hr tablet Take 1 tablet (2.5 mg total) by mouth 2 (two) times daily. 180 tablet 3   glucose blood (ONETOUCH VERIO) test strip TEST BLOOD SUGAR 4 TIMES DAILY DX E13.42 400 strip 3   ibuprofen (ADVIL) 800 MG tablet Take 1 tablet (800 mg total) by mouth 2 (two) times daily as needed. 180 tablet 3   insulin aspart protamine- aspart (NOVOLOG MIX 70/30) (70-30) 100 UNIT/ML injection Inject 0.35-0.45 mLs (35-45 Units  total) into the skin 3 (three) times daily before meals. 120 mL 3   latanoprost (XALATAN) 0.005 % ophthalmic solution Place 1 drop into both eyes at bedtime. 7.5 mL 3   levothyroxine (SYNTHROID) 50 MCG tablet Take 1 tablet (50 mcg total) by mouth daily. 90 tablet 3   metFORMIN (GLUCOPHAGE) 1000 MG tablet Take 1 tablet (1,000 mg total) by mouth 2 (two) times daily with a meal. 180 tablet 3   nitroGLYCERIN (NITROSTAT) 0.4 MG SL tablet Place 1 tablet (0.4 mg total) under the tongue every 5 (five) minutes x 3 doses as needed for chest pain. (Patient not taking: Reported on 11/15/2023) 75 tablet 3   No current facility-administered  medications on file prior to visit.    Allergies  Allergen Reactions   Levemir [Insulin Detemir] Swelling   Lisinopril Cough   Penicillins Rash    Lip swelling    Past Medical History:  Diagnosis Date   CAD (coronary artery disease) 01/09/13   Inferior STEMI s/p DES-RCA   Cataract    Diabetes mellitus    Uncontrolled, Hgb A1C 14.8% on 03/14, started on insulin   Gout    Hyperlipidemia    Hypertension    Ischemic cardiomyopathy    EF 45-50%, grade 1 diastolic dysfunction, mildly dilated RV, RA at the upper limits of normal, mild TR, PA systolic pressure 32 mm mercury and hypokinesis to akinesis of the basal mid inferior myocardium   Myocardial infarction (HCC) 01/09/2013   Shortness of breath    Thyroid disease     Past Surgical History:  Procedure Laterality Date   CARDIAC CATHETERIZATION  01/22/2013   Diffuse borderline residual CAD consistent with uncontrolled diabetes, medical management recommended   CORONARY ANGIOPLASTY WITH STENT PLACEMENT  01/09/2013   30% pLAD, 30% mLAD, 70% pLCx, RCA occlusion at crux with R->L distal collaterals s/p DES; LVEF 50%, moderate-severe HK of inferior basal wall   EYE SURGERY Bilateral    LEFT HEART CATHETERIZATION WITH CORONARY ANGIOGRAM N/A 01/09/2013   Procedure: LEFT HEART CATHETERIZATION WITH CORONARY ANGIOGRAM;  Surgeon: Zakir Henner M [place], MD;  Location[facility] C[facility] LAB;  Service: Cardiovascular;  Laterality: N/A;   LEFT HEART CATHETERIZATION WITH CORONARY ANGIOGRAM N/A 01/22/2013   Procedure: LEFT HEART CATHETERIZATION WITH CORONARY ANGIOGRAM;  Surgeon: Tonny Bollman, MD;  Location: [facility] CATH LAB;  Service: Cardiovascular;  Laterality: N/A;    Social History   Tobacco Use  Smoking Status Never  Smokeless Tobacco Never    Social History   Substance and Sexual Activity  Alcohol Use Yes   Comment: 4 times per year-wine or beer    Family History  Problem Relation Age of Onset   Heart disease Mother    Heart disease Father    Heart  disease Brother    Heart disease Brother    Colon cancer Neg Hx    Colon polyps Neg Hx     Review of Systems: As noted in history of present illness.  All other systems were reviewed and are negative.  Physical Exam: BP (!) 142/80 (BP Location: Right Arm, Cuff Size: Large)   Pulse 73   Ht 5\' 6"  (1.676 m)   Wt 205 lb (93 kg)   SpO2 95%   BMI 33.09 kg/m  GENERAL:  Well appearing male, overweight  in NAD HEENT:  he is blind NECK:  No jugular venous distention, no bruits LUNGS:  Clear to auscultation bilaterally CHEST:  Unremarkable HEART:  RRR,  PMI not displaced or sustained,S1 and  S2 within normal limits, no S3, no S4: no clicks, no rubs, no murmurs ABD:  Soft, nontender. BS +, no masses or bruits. No hepatomegaly, no splenomegaly EXT:  2 + pulses throughout, no edema, no cyanosis no clubbing SKIN:  Warm and dry.  No rashes NEURO:  Alert and oriented x 3. Cranial nerves II through XII intact. PSYCH:  Cognitively intact      LABORATORY DATA: Lab Results  Component Value Date   WBC 7.7 07/26/2023   HGB 14.1 07/26/2023   HCT 41.6 07/26/2023   PLT 207 07/26/2023   GLUCOSE 143 (H) 07/26/2023   CHOL 113 07/26/2023   TRIG 170 (H) 07/26/2023   HDL 38 (L) 07/26/2023   LDLCALC 47 07/26/2023   ALT 21 07/26/2023   AST 20 07/26/2023   NA 141 07/26/2023   K 4.9 07/26/2023   CL 103 07/26/2023   CREATININE 1.41 (H) 07/26/2023   BUN 22 07/26/2023   CO2 23 07/26/2023   TSH 2.670 07/26/2023   PSA 0.6 09/22/2014   INR 1.16 01/21/2013   HGBA1C 8.3 (H) 10/31/2023   MICROALBUR neg 11/02/2014   EKG Interpretation Date/Time:  Friday November 15 2023 11:33:56 EST Ventricular Rate:  73 PR Interval:  144 QRS Duration:  92 QT Interval:  414 QTC Calculation: 456 R Axis:   3  Text Interpretation: Normal sinus rhythm Inferior infarct , age undetermined When compared with ECG of July 2023  No significant change since last tracing  Confirmed by [place], Maysun Meditz [phone]) on 11/15/2023  11:45:41 AM   Myoview 06/26/16:Study Highlights     The left ventricular ejection fraction is mildly decreased (45-54%). Nuclear stress EF: 48%. Blood pressure demonstrated a hypertensive response to exercise. There was no ST segment deviation noted during stress. Findings consistent with prior myocardial infarction. This is an intermediate risk study.   Small inferobasal wall infarct no ischemia EF 48%    Myoview 11/03/20: Study Highlights    Nuclear stress EF: 53%. No wall motion abnormalities. The left ventricular ejection fraction is mildly decreased (45-54%). There was no ST segment deviation noted during stress. Defect 1: There is a medium defect of moderate severity present in the basal inferior and mid inferior location. Findings consistent with prior myocardial infarction inferior wall. There is no significant ischemia identified. This is an intermediate risk study.   Donato Schultz, MD  Assessment / Plan: 1. Coronary disease status post inferior STEMI 2014 treated with DES to the RCA. Repeat cardiac catheterization in 2014 demonstrated continued patency. Moderate diffuse disease in the left circumflex. Myoview in January 2022 showed no ischemia.  He has class 1 angina. Continue medical therapy with ASA, statin  2. Diabetes mellitus: On metformin, glipizide, and insulin . Followed by primary care. Last A1c 8.3%  3. Dyslipidemia. On high dose lipitor and Zetia. Last LDL 47 is at goal < 55.  Encourage heart healthy diet.   4. Hypertension, continue Atacand and PRN cardizem  5. Diabetic retinopathy and neuropathy. Now legally blind.   6. Chronic subdural Hematoma. Seen by Neurosurgery. CT in May stable.    Follow up in 6 months.

## 2023-11-15 ENCOUNTER — Encounter: Payer: Self-pay | Admitting: Cardiology

## 2023-11-15 ENCOUNTER — Ambulatory Visit: Payer: PPO | Attending: Cardiology | Admitting: Cardiology

## 2023-11-15 VITALS — BP 142/80 | HR 73 | Ht 66.0 in | Wt 205.0 lb

## 2023-11-15 DIAGNOSIS — I10 Essential (primary) hypertension: Secondary | ICD-10-CM | POA: Diagnosis not present

## 2023-11-15 DIAGNOSIS — E782 Mixed hyperlipidemia: Secondary | ICD-10-CM

## 2023-11-15 DIAGNOSIS — I25118 Atherosclerotic heart disease of native coronary artery with other forms of angina pectoris: Secondary | ICD-10-CM

## 2023-11-15 NOTE — Patient Instructions (Signed)
 Medication Instructions:  Continue same medications *If you need a refill on your cardiac medications before your next appointment, please call your pharmacy*   Lab Work: None ordered   Testing/Procedures: None ordered   Follow-Up: At [facility], you and your health needs are our priority.  As part of our continuing mission to provide you with exceptional heart care, we have created designated Provider Care Teams.  These Care Teams include your primary Cardiologist (physician) and Advanced Practice Providers (APPs -  Physician Assistants and Nurse Practitioners) who all work together to provide you with the care you need, when you need it.  We recommend signing up for the patient portal called "MyChart".  Sign up information is provided on this After Visit Summary.  MyChart is used to connect with patients for Virtual Visits (Telemedicine).  Patients are able to view lab/test results, encounter notes, upcoming appointments, etc.  Non-urgent messages can be sent to your provider as well.   To learn more about what you can do with MyChart, go to [email or web address].    Your next appointment:  6 months    Call in April to schedule July appointment     Provider:  Dr.Jordan

## 2024-02-07 ENCOUNTER — Encounter: Payer: Self-pay | Admitting: Family Medicine

## 2024-02-07 ENCOUNTER — Ambulatory Visit: Payer: PPO | Admitting: Family Medicine

## 2024-02-07 VITALS — BP 125/70 | HR 72 | Ht 66.0 in | Wt 207.0 lb

## 2024-02-07 DIAGNOSIS — E782 Mixed hyperlipidemia: Secondary | ICD-10-CM | POA: Diagnosis not present

## 2024-02-07 DIAGNOSIS — E039 Hypothyroidism, unspecified: Secondary | ICD-10-CM

## 2024-02-07 DIAGNOSIS — E11319 Type 2 diabetes mellitus with unspecified diabetic retinopathy without macular edema: Secondary | ICD-10-CM

## 2024-02-07 DIAGNOSIS — Z7984 Long term (current) use of oral hypoglycemic drugs: Secondary | ICD-10-CM

## 2024-02-07 DIAGNOSIS — E1342 Other specified diabetes mellitus with diabetic polyneuropathy: Secondary | ICD-10-CM

## 2024-02-07 DIAGNOSIS — E1159 Type 2 diabetes mellitus with other circulatory complications: Secondary | ICD-10-CM | POA: Diagnosis not present

## 2024-02-07 DIAGNOSIS — I10 Essential (primary) hypertension: Secondary | ICD-10-CM

## 2024-02-07 LAB — BAYER DCA HB A1C WAIVED: HB A1C (BAYER DCA - WAIVED): 7.3 % — ABNORMAL HIGH (ref 4.8–5.6)

## 2024-02-07 MED ORDER — NOVOLOG FLEXPEN 100 UNIT/ML ~~LOC~~ SOPN: 35.0000 [IU] | PEN_INJECTOR | Freq: Three times a day (TID) | SUBCUTANEOUS | 3 refills | Status: DC

## 2024-02-07 MED ORDER — DILTIAZEM HCL 30 MG PO TABS
30.0000 mg | ORAL_TABLET | Freq: Three times a day (TID) | ORAL | 1 refills | Status: AC | PRN
Start: 2024-02-07 — End: ?

## 2024-02-07 NOTE — Progress Notes (Signed)
 BP 125/70   Pulse 72   Ht 5\' 6"  (1.676 m)   Wt 207 lb (93.9 kg)   SpO2 98%   BMI 33.41 kg/m    Subjective:   Patient ID: Ethan Peters, male    DOB: Jan 02, 1956, 68 y.o.   MRN: [id number]  HPI: Ethan Peters is a 68 y.o. male presenting on 02/07/2024 for Medical Management of Chronic Issues, Diabetes, and Hypertension   HPI Type 2 diabetes mellitus Patient comes in today for recheck of his diabetes. Patient has been currently taking NovoLog  35 to 50 units 3 times daily with meals. Patient is currently on an ACE inhibitor/ARB. Patient has not seen an ophthalmologist this year. Patient denies any new issues with their feet. The symptom started onset as an adult hypertension and retinopathy and CAD and hyperlipidemia ARE RELATED TO DM   Hypertension Patient is currently on candesartan  and diltiazem , and their blood pressure today is 125/70. Patient denies any lightheadedness or dizziness. Patient denies headaches, blurred vision, chest pains, shortness of breath, or weakness. Denies any side effects from medication and is content with current medication.   Hyperlipidemia Patient is coming in for recheck of his hyperlipidemia. The patient is currently taking Zetia  and atorvastatin . They deny any issues with myalgias or history of liver damage from it. They deny any focal numbness or weakness or chest pain.   Hypothyroidism recheck Patient is coming in for thyroid  recheck today as well. They deny any issues with hair changes or heat or cold problems or diarrhea or constipation. They deny any chest pain or palpitations. They are currently on levothyroxine  50 micrograms   Relevant past medical, surgical, family and social history reviewed and updated as indicated. Interim medical history since our last visit reviewed. Allergies and medications reviewed and updated.  Review of Systems  Constitutional:  Negative for chills and fever.  Eyes:  Positive for visual disturbance.   Respiratory:  Negative for shortness of breath and wheezing.   Cardiovascular:  Negative for chest pain and leg swelling.  Musculoskeletal:  Negative for back pain and gait problem.  Skin:  Negative for rash.  Neurological:  Negative for dizziness and light-headedness.  Psychiatric/Behavioral:  Negative for decreased concentration.   All other systems reviewed and are negative.   Per HPI unless specifically indicated above   Allergies as of 02/07/2024       Reactions   Levemir  [insulin  Detemir] Swelling   Lisinopril  Cough   Penicillins Rash   Lip swelling        Medication List        Accurate as of February 07, 2024  1:50 PM. If you have any questions, ask your nurse or doctor.          STOP taking these medications    insulin  aspart protamine- aspart (70-30) 100 UNIT/ML injection Commonly known as: NovoLOG  Mix 70/30 Stopped by: Lucio Sabin Vadie Principato       TAKE these medications    allopurinol  300 MG tablet Commonly known as: ZYLOPRIM  Take 1 tablet (300 mg total) by mouth daily.   aspirin  EC 81 MG tablet Take 1 tablet (81 mg total) by mouth daily.   atorvastatin  80 MG tablet Commonly known as: LIPITOR  Take 1 tablet (80 mg total) by mouth daily.   B-complex with vitamin C tablet Take 1 tablet by mouth as needed (for vitamin deficiency).   brimonidine  0.2 % ophthalmic solution Commonly known as: ALPHAGAN  Place 1 drop into both eyes 3 (three)  times daily. Give 90-day supply at a time   candesartan  8 MG tablet Commonly known as: ATACAND  Take 1 tablet (8 mg total) by mouth 2 (two) times daily.   cholecalciferol  25 MCG (1000 UNIT) tablet Commonly known as: VITAMIN D3 Take 1,000 Units by mouth daily.   diltiazem  30 MG tablet Commonly known as: CARDIZEM  Take 1 tablet (30 mg total) by mouth 3 (three) times daily as needed (When blood pressure greater than 150/90).   donepezil  10 MG tablet Commonly known as: ARICEPT  Take 1 tablet (10 mg total) by mouth at  bedtime.   dorzolamide -timolol  2-0.5 % ophthalmic solution Commonly known as: COSOPT  1 DROP BOTH EYES TWICE A DAY. SEPARATE BY AT LEAST 10 MINUTES FROM OTHER PRESSURE REDUCING EYE DROPS What changed: See the new instructions.   ezetimibe  10 MG tablet Commonly known as: ZETIA  TAKE 1 TABLET BY MOUTH EVERY DAY   glipiZIDE  2.5 MG 24 hr tablet Commonly known as: GLUCOTROL  XL Take 1 tablet (2.5 mg total) by mouth 2 (two) times daily.   ibuprofen  800 MG tablet Commonly known as: ADVIL  Take 1 tablet (800 mg total) by mouth 2 (two) times daily as needed.   latanoprost  0.005 % ophthalmic solution Commonly known as: XALATAN  Place 1 drop into both eyes at bedtime.   levothyroxine  50 MCG tablet Commonly known as: SYNTHROID  Take 1 tablet (50 mcg total) by mouth daily.   metFORMIN  1000 MG tablet Commonly known as: GLUCOPHAGE  Take 1 tablet (1,000 mg total) by mouth 2 (two) times daily with a meal.   nitroGLYCERIN  0.4 MG SL tablet Commonly known as: NITROSTAT  Place 1 tablet (0.4 mg total) under the tongue every 5 (five) minutes x 3 doses as needed for chest pain.   NovoLOG  FlexPen 100 UNIT/ML FlexPen Generic drug: insulin  aspart Inject 35-50 Units into the skin 3 (three) times daily with meals. Started by: Lucio Sabin Lucynda Rosano   OneTouch Verio test strip Generic drug: glucose blood TEST BLOOD SUGAR 4 TIMES DAILY DX E13.42         Objective:   BP 125/70   Pulse 72   Ht 5\' 6"  (1.676 m)   Wt 207 lb (93.9 kg)   SpO2 98%   BMI 33.41 kg/m   Wt Readings from Last 3 Encounters:  02/07/24 207 lb (93.9 kg)  11/15/23 205 lb (93 kg)  10/31/23 201 lb (91.2 kg)    Physical Exam Vitals and nursing note reviewed.  Constitutional:      General: He is not in acute distress.    Appearance: He is well-developed. He is not diaphoretic.  Eyes:     General: No scleral icterus.    Conjunctiva/sclera: Conjunctivae normal.  Neck:     Thyroid : No thyromegaly.  Cardiovascular:     Rate and  Rhythm: Normal rate and regular rhythm.     Heart sounds: Normal heart sounds. No murmur heard. Pulmonary:     Effort: Pulmonary effort is normal. No respiratory distress.     Breath sounds: Normal breath sounds. No wheezing.  Musculoskeletal:        General: No swelling. Normal range of motion.     Cervical back: Neck supple.  Lymphadenopathy:     Cervical: No cervical adenopathy.  Skin:    General: Skin is warm and dry.     Findings: No rash.  Neurological:     Mental Status: He is alert and oriented to person, place, and time.     Coordination: Coordination normal.  Psychiatric:  Behavior: Behavior normal.       Assessment & Plan:   Problem List Items Addressed This Visit       Cardiovascular and Mediastinum   DM type 2 causing vascular disease (HCC)   Relevant Medications   diltiazem  (CARDIZEM ) 30 MG tablet   insulin  aspart (NOVOLOG  FLEXPEN) 100 UNIT/ML FlexPen   Other Relevant Orders   Bayer DCA Hb A1c Waived   CBC with Differential/Platelet   CMP14+EGFR   Lipid panel   Microalbumin/Creatinine Ratio, Urine   Essential hypertension - Primary   Relevant Medications   diltiazem  (CARDIZEM ) 30 MG tablet   Other Relevant Orders   Bayer DCA Hb A1c Waived   CBC with Differential/Platelet   CMP14+EGFR   Lipid panel     Endocrine   Diabetic neuropathy (HCC)   Relevant Medications   insulin  aspart (NOVOLOG  FLEXPEN) 100 UNIT/ML FlexPen   Hypothyroidism   Relevant Orders   TSH   Diabetic retinopathy of both eyes associated with type 2 diabetes mellitus (HCC)   Relevant Medications   insulin  aspart (NOVOLOG  FLEXPEN) 100 UNIT/ML FlexPen     Other   Mixed hyperlipidemia   Relevant Medications   diltiazem  (CARDIZEM ) 30 MG tablet   Other Relevant Orders   Bayer DCA Hb A1c Waived   CBC with Differential/Platelet   CMP14+EGFR   Lipid panel  A1c is much better at 7.3.  Blood pressure and everything looks good today.  No changes.  He says he is getting NovoLog   regular not the 7030 and he does not know why it was changed to 7030.  Follow up plan: Return in about 3 months (around 05/08/2024), or if symptoms worsen or fail to improve, for Diabetes and hypertension and hypothyroidism.  Counseling provided for all of the vaccine components Orders Placed This Encounter  Procedures   Bayer DCA Hb A1c Waived   CBC with Differential/Platelet   CMP14+EGFR   Lipid panel   TSH   Microalbumin/Creatinine Ratio, Urine    Jolyne Needs, MD [facility] 02/07/2024, 1:50 PM

## 2024-02-08 LAB — CBC WITH DIFFERENTIAL/PLATELET
Basophils Absolute: 0.1 10*3/uL (ref 0.0–0.2)
Basos: 1 %
EOS (ABSOLUTE): 0.1 10*3/uL (ref 0.0–0.4)
Eos: 2 %
Hematocrit: 36.1 % — ABNORMAL LOW (ref 37.5–51.0)
Hemoglobin: 12.2 g/dL — ABNORMAL LOW (ref 13.0–17.7)
Immature Grans (Abs): 0 10*3/uL (ref 0.0–0.1)
Immature Granulocytes: 0 %
Lymphocytes Absolute: 2.3 10*3/uL (ref 0.7–3.1)
Lymphs: 42 %
MCH: 31.8 pg (ref 26.6–33.0)
MCHC: 33.8 g/dL (ref 31.5–35.7)
MCV: 94 fL (ref 79–97)
Monocytes Absolute: 0.6 10*3/uL (ref 0.1–0.9)
Monocytes: 11 %
Neutrophils Absolute: 2.5 10*3/uL (ref 1.4–7.0)
Neutrophils: 44 %
Platelets: 167 10*3/uL (ref 150–450)
RBC: 3.84 x10E6/uL — ABNORMAL LOW (ref 4.14–5.80)
RDW: 13.3 % (ref 11.6–15.4)
WBC: 5.6 10*3/uL (ref 3.4–10.8)

## 2024-02-08 LAB — LIPID PANEL
Chol/HDL Ratio: 3 ratio (ref 0.0–5.0)
Cholesterol, Total: 98 mg/dL — ABNORMAL LOW (ref 100–199)
HDL: 33 mg/dL — ABNORMAL LOW (ref >39)
LDL Chol Calc (NIH): 27 mg/dL (ref 0–99)
Triglycerides: 247 mg/dL — ABNORMAL HIGH (ref 0–149)
VLDL Cholesterol Cal: 38 mg/dL (ref 5–40)

## 2024-02-08 LAB — CMP14+EGFR
ALT: 20 IU/L (ref 0–44)
AST: 19 IU/L (ref 0–40)
Albumin: 4.5 g/dL (ref 3.9–4.9)
Alkaline Phosphatase: 102 IU/L (ref 44–121)
BUN/Creatinine Ratio: 16 (ref 10–24)
BUN: 25 mg/dL (ref 8–27)
Bilirubin Total: 0.4 mg/dL (ref 0.0–1.2)
CO2: 24 mmol/L (ref 20–29)
Calcium: 9.1 mg/dL (ref 8.6–10.2)
Chloride: 104 mmol/L (ref 96–106)
Creatinine, Ser: 1.6 mg/dL — ABNORMAL HIGH (ref 0.76–1.27)
Globulin, Total: 2.8 g/dL (ref 1.5–4.5)
Glucose: 83 mg/dL (ref 70–99)
Potassium: 4.2 mmol/L (ref 3.5–5.2)
Sodium: 143 mmol/L (ref 134–144)
Total Protein: 7.3 g/dL (ref 6.0–8.5)
eGFR: 47 mL/min/{1.73_m2} — ABNORMAL LOW (ref >59)

## 2024-02-08 LAB — TSH: TSH: 4.07 u[IU]/mL (ref 0.450–4.500)

## 2024-02-09 LAB — MICROALBUMIN / CREATININE URINE RATIO
Creatinine, Urine: 147.2 mg/dL
Microalb/Creat Ratio: 8 mg/g{creat} (ref 0–29)
Microalbumin, Urine: 11.5 ug/mL

## 2024-02-14 ENCOUNTER — Telehealth: Payer: Self-pay | Admitting: *Deleted

## 2024-02-14 ENCOUNTER — Encounter: Payer: Self-pay | Admitting: Family Medicine

## 2024-02-14 MED ORDER — INSULIN ASPART 100 UNIT/ML IJ SOLN: 25.0000 [IU] | Freq: Three times a day (TID) | INTRAMUSCULAR | 3 refills | Status: AC

## 2024-02-14 NOTE — Telephone Encounter (Signed)
 Pt uses Novolog  vials not the flexpens Please send in new script to [facility]

## 2024-02-14 NOTE — Addendum Note (Signed)
 Addended by: Jolyne Needs on: 02/14/2024 11:19 AM   Modules accepted: Orders

## 2024-04-20 ENCOUNTER — Other Ambulatory Visit: Payer: Self-pay | Admitting: Family Medicine

## 2024-04-20 DIAGNOSIS — I10 Essential (primary) hypertension: Secondary | ICD-10-CM

## 2024-04-21 ENCOUNTER — Other Ambulatory Visit: Payer: Self-pay | Admitting: Cardiology

## 2024-04-21 ENCOUNTER — Ambulatory Visit

## 2024-04-21 ENCOUNTER — Encounter: Payer: Self-pay | Admitting: Family Medicine

## 2024-04-21 VITALS — BP 125/70 | HR 72 | Ht 66.0 in | Wt 207.0 lb

## 2024-04-21 DIAGNOSIS — Z0000 Encounter for general adult medical examination without abnormal findings: Secondary | ICD-10-CM | POA: Diagnosis not present

## 2024-04-21 NOTE — Progress Notes (Cosign Needed Addendum)
 Subjective:   Ethan Peters is a 68 y.o. who presents for a Medicare Wellness preventive visit.  As a reminder, Annual Wellness Visits don't include a physical exam, and some assessments may be limited, especially if this visit is performed virtually. We may recommend an in-person follow-up visit with your provider if needed.  Visit Complete: Virtual I connected with  Ethan Peters on 05/06/24 by a audio enabled telemedicine application and verified that I am speaking with the correct person using two identifiers.  Patient Location: Home  Provider Location: Home Office  I discussed the limitations of evaluation and management by telemedicine. The patient expressed understanding and agreed to proceed.  Vital Signs: Because this visit was a virtual/telehealth visit, some criteria may be missing or patient reported. Any vitals not documented were not able to be obtained and vitals that have been documented are patient reported.  VideoDeclined- This patient declined Librarian, academic. Therefore the visit was completed with audio only.  Persons Participating in Visit: Patient assisted by wife, Lucisa.  AWV Questionnaire: No: Patient Medicare AWV questionnaire was not completed prior to this visit.  Cardiac Risk Factors include: advanced age (>67men, >59 women);diabetes mellitus;dyslipidemia;male gender;obesity (BMI >30kg/m2);hypertension     Objective:    Today's Vitals   04/21/24 1125  BP: 125/70  Pulse: 72  Weight: 207 lb (93.9 kg)  Height: 5' 6 (1.676 m)   Body mass index is 33.41 kg/m.     04/21/2024   11:37 AM 01/29/2023    2:59 PM 01/22/2022    4:43 PM 01/15/2022    1:27 PM 11/23/2020    1:24 PM 11/20/2019    1:58 PM 11/18/2018    5:23 PM  Advanced Directives  Does Patient Have a Medical Advance Directive? No No No No No No   Would patient like information on creating a medical advance directive?  No - Patient declined No - Patient declined  No - Patient declined No - Patient declined No - Patient declined Yes (MAU/Ambulatory/Procedural Areas - Information given)      Data saved with a previous flowsheet row definition    Current Medications (verified) Outpatient Encounter Medications as of 04/21/2024  Medication Sig   allopurinol  (ZYLOPRIM ) 300 MG tablet Take 1 tablet (300 mg total) by mouth daily.   aspirin  EC 81 MG EC tablet Take 1 tablet (81 mg total) by mouth daily.   atorvastatin  (LIPITOR ) 80 MG tablet Take 1 tablet (80 mg total) by mouth daily.   B Complex-C (B-COMPLEX WITH VITAMIN C) tablet Take 1 tablet by mouth as needed (for vitamin deficiency).   brimonidine  (ALPHAGAN ) 0.2 % ophthalmic solution Place 1 drop into both eyes 3 (three) times daily. Give 90-day supply at a time   candesartan  (ATACAND ) 8 MG tablet TAKE 1 TABLET (8 MG TOTAL) BY MOUTH 2 (TWO) TIMES DAILY.   cholecalciferol  (VITAMIN D3) 25 MCG (1000 UNIT) tablet Take 1,000 Units by mouth daily.   diltiazem  (CARDIZEM ) 30 MG tablet Take 1 tablet (30 mg total) by mouth 3 (three) times daily as needed (When blood pressure greater than 150/90).   donepezil  (ARICEPT ) 10 MG tablet Take 1 tablet (10 mg total) by mouth at bedtime.   dorzolamide -timolol  (COSOPT ) 22.3-6.8 MG/ML ophthalmic solution 1 DROP BOTH EYES TWICE A DAY. SEPARATE BY AT LEAST 10 MINUTES FROM OTHER PRESSURE REDUCING EYE DROPS (Patient taking differently: Place 1 drop into both eyes 2 (two) times daily.)   glipiZIDE  (GLUCOTROL  XL) 2.5 MG 24 hr  tablet Take 1 tablet (2.5 mg total) by mouth 2 (two) times daily.   glucose blood (ONETOUCH VERIO) test strip TEST BLOOD SUGAR 4 TIMES DAILY DX E13.42   ibuprofen  (ADVIL ) 800 MG tablet Take 1 tablet (800 mg total) by mouth 2 (two) times daily as needed.   insulin  aspart (NOVOLOG ) 100 UNIT/ML injection Inject 25-50 Units into the skin 3 (three) times daily before meals.   latanoprost  (XALATAN ) 0.005 % ophthalmic solution Place 1 drop into both eyes at bedtime.    levothyroxine  (SYNTHROID ) 50 MCG tablet Take 1 tablet (50 mcg total) by mouth daily.   metFORMIN  (GLUCOPHAGE ) 1000 MG tablet Take 1 tablet (1,000 mg total) by mouth 2 (two) times daily with a meal.   [DISCONTINUED] ezetimibe  (ZETIA ) 10 MG tablet TAKE 1 TABLET BY MOUTH EVERY DAY   nitroGLYCERIN  (NITROSTAT ) 0.4 MG SL tablet Place 1 tablet (0.4 mg total) under the tongue every 5 (five) minutes x 3 doses as needed for chest pain. (Patient not taking: Reported on 04/21/2024)   No facility-administered encounter medications on file as of 04/21/2024.    Allergies (verified) Levemir  [insulin  detemir], Lisinopril , and Penicillins   History: Past Medical History:  Diagnosis Date   CAD (coronary artery disease) 01/09/13   Inferior STEMI s/p DES-RCA   Cataract    Diabetes mellitus    Uncontrolled, Hgb A1C 14.8% on 03/14, started on insulin    Gout    Hyperlipidemia    Hypertension    Ischemic cardiomyopathy    EF 45-50%, grade 1 diastolic dysfunction, mildly dilated RV, RA at the upper limits of normal, mild TR, PA systolic pressure 32 mm mercury and hypokinesis to akinesis of the basal mid inferior myocardium   Myocardial infarction (HCC) 01/09/2013   Shortness of breath    Thyroid  disease    Past Surgical History:  Procedure Laterality Date   CARDIAC CATHETERIZATION  01/22/2013   Diffuse borderline residual CAD consistent with uncontrolled diabetes, medical management recommended   CORONARY ANGIOPLASTY WITH STENT PLACEMENT  01/09/2013   30% pLAD, 30% mLAD, 70% pLCx, RCA occlusion at crux with R->L distal collaterals s/p DES; LVEF 50%, moderate-severe HK of inferior basal wall   EYE SURGERY Bilateral    LEFT HEART CATHETERIZATION WITH CORONARY ANGIOGRAM N/A 01/09/2013   Procedure: LEFT HEART CATHETERIZATION WITH CORONARY ANGIOGRAM;  Surgeon: Peter M Swaziland, MD;  Location: [facility] CATH LAB;  Service: Cardiovascular;  Laterality: N/A;   LEFT HEART CATHETERIZATION WITH CORONARY ANGIOGRAM N/A 01/22/2013    Procedure: LEFT HEART CATHETERIZATION WITH CORONARY ANGIOGRAM;  Surgeon: Ozell Fell, MD;  Location: [facility] CATH LAB;  Service: Cardiovascular;  Laterality: N/A;   Family History  Problem Relation Age of Onset   Heart disease Mother    Heart disease Father    Heart disease Brother    Heart disease Brother    Colon cancer Neg Hx    Colon polyps Neg Hx    Social History   Socioeconomic History   Marital status: Married    Spouse name: Louetta Baker   Number of children: 1   Years of education: 16   Highest education level: Associate degree: occupational, Scientist, product/process development, or vocational program  Occupational History   Occupation: disabled    Comment: Chartered certified accountant   Tobacco Use   Smoking status: Never   Smokeless tobacco: Never  Vaping Use   Vaping status: Never Used  Substance and Sexual Activity   Alcohol use: Yes    Comment: 4 times per year-wine or beer   Drug  use: No   Sexual activity: Yes    Birth control/protection: None  Other Topics Concern   Not on file  Social History Narrative   Spanish-speaking. Limited English.    Social Drivers of Corporate investment banker Strain: Low Risk  (04/21/2024)   Overall Financial Resource Strain (CARDIA)    Difficulty of Paying Living Expenses: Not hard at all  Food Insecurity: No Food Insecurity (04/21/2024)   Hunger Vital Sign    Worried About Running Out of Food in the Last Year: Never true    Ran Out of Food in the Last Year: Never true  Transportation Needs: No Transportation Needs (04/21/2024)   PRAPARE - Administrator, Civil Service (Medical): No    Lack of Transportation (Non-Medical): No  Physical Activity: Insufficiently Active (04/21/2024)   Exercise Vital Sign    Days of Exercise per Week: 2 days    Minutes of Exercise per Session: 20 min  Stress: No Stress Concern Present (04/21/2024)   [employer] of Occupational Health - Occupational Stress Questionnaire    Feeling of Stress: Only a little  Social  Connections: Moderately Isolated (04/21/2024)   Social Connection and Isolation Panel    Frequency of Communication with Friends and Family: More than three times a week    Frequency of Social Gatherings with Friends and Family: More than three times a week    Attends Religious Services: Never    Database administrator or Organizations: No    Attends Engineer, structural: Never    Marital Status: Married    Tobacco Counseling Counseling given: Yes    Clinical Intake:  Pre-visit preparation completed: Yes  Pain : No/denies pain     BMI - recorded: 33.41 Nutritional Status: BMI > 30  Obese Nutritional Risks: None Diabetes: Yes CBG done?: No (140 this morning)  Lab Results  Component Value Date   HGBA1C 7.3 (H) 02/07/2024   HGBA1C 8.3 (H) 10/31/2023   HGBA1C 7.2 (H) 07/26/2023     How often do you need to have someone help you when you read instructions, pamphlets, or other written materials from your doctor or pharmacy?: 5 - Always (pt's wife since pt is blind)  Interpreter Needed?: No  Comments: assisted w/pt's wife-Lucisa/pt is blind Information entered by :: alia t/cma   Activities of Daily Living     04/21/2024   11:33 AM  In your present state of health, do you have any difficulty performing the following activities:  Hearing? 0  Vision? 1  Difficulty concentrating or making decisions? 0  Walking or climbing stairs? 0  Dressing or bathing? 0  Doing errands, shopping? 1  Comment pt's wife/family help  Preparing Food and eating ? Y  Comment pt's wife/family help  Using the Toilet? N  In the past six months, have you accidently leaked urine? Y  Do you have problems with loss of bowel control? N  Managing your Medications? Y  Comment pt's wife/family help  Managing your Finances? Y  Comment pt's wife/family help  Housekeeping or managing your Housekeeping? Y  Comment pt's wife/family help    Patient Care Team: Dettinger, Fonda LABOR, MD as PCP -  General (Family Medicine) Swazil[place], Peter M, MD as PCP - Cardiology (Cardiology) Lenis Ethelle ORN, MD as Consulting Physician (Endocrinology) Godwin, Kristen Suzich, MD (Ophthalmology)  I have updated your Care Teams any recent Medical Services you may have received from other providers in the past year.  Assessment:   This is a routine wellness examination for Ethan Peters.  Hearing/Vision screen Hearing Screening - Comments:: Pt denies hearing dif Vision Screening - Comments:: Pt is blind   Goals Addressed               This Visit's Progress     Patient Stated (pt-stated)   On track     Continue healthy diet and exercise regimen.        Depression Screen     04/21/2024   11:42 AM 02/07/2024    1:14 PM 10/31/2023   11:22 AM 07/26/2023    9:47 AM 04/24/2023    9:02 AM 03/18/2023    2:36 PM 01/30/2023    3:50 PM  PHQ 2/9 Scores  PHQ - 2 Score 4 0 2 2 0  0  PHQ- 9 Score 5  7 7  0  0  Exception Documentation      Patient refusal     Fall Risk     04/21/2024   11:28 AM 02/07/2024    1:13 PM 10/31/2023   11:22 AM 07/26/2023    9:46 AM 04/24/2023    9:02 AM  Fall Risk   Falls in the past year? 0 0 0 0 0  Number falls in past yr: 0 0     Injury with Fall? 0 0     Risk for fall due to : No Fall Risks Impaired vision;Impaired balance/gait     Follow up Falls evaluation completed Falls evaluation completed       MEDICARE RISK AT HOME:  Medicare Risk at Home Any stairs in or around the home?: No If so, are there any without handrails?: No Home free of loose throw rugs in walkways, pet beds, electrical cords, etc?: Yes Adequate lighting in your home to reduce risk of falls?: Yes Life alert?: No Use of a cane, walker or w/c?: Yes Grab bars in the bathroom?: No Shower chair or bench in shower?: No Elevated toilet seat or a handicapped toilet?: No  TIMED UP AND GO:  Was the test performed?  no  Cognitive Function: 6CIT completed    11/18/2018    5:23 PM  MMSE - Mini  Mental State Exam  Orientation to time 5  Orientation to Place 5  Registration 3  Attention/ Calculation 5  Recall 1  Language- name 2 objects 2  Language- repeat 0  Language- follow 3 step command 3  Language- read & follow direction 1  Write a sentence 1  Copy design 1  Total score 27        04/21/2024   11:45 AM 01/29/2023    3:00 PM 11/23/2020    1:24 PM 11/20/2019    2:05 PM  6CIT Screen  What Year? 0 points 0 points 0 points 0 points  What month? 0 points 0 points 0 points 0 points  What time? 0 points 0 points 0 points 0 points  Count back from 20 0 points 0 points 0 points 0 points  Months in reverse 0 points 0 points 0 points 0 points  Repeat phrase 2 points 0 points 0 points 2 points  Total Score 2 points 0 points 0 points 2 points    Immunizations Immunization History  Administered Date(s) Administered   Fluad Quad(high Dose 65+) 08/30/2020, 07/26/2021, 07/26/2022   Fluad Trivalent(High Dose 65+) 07/26/2023   Influenza Inj Mdck Quad With Preservative 07/28/2019   Influenza,inj,Quad PF,6+ Mos 08/22/2015, 07/27/2016, 07/16/2017, 08/13/2018   PNEUMOCOCCAL CONJUGATE-20 08/03/2022,  07/26/2023   Pneumococcal Conjugate-13 11/27/2016   Pneumococcal Polysaccharide-23 09/25/2007, 08/13/2018, 09/30/2019, 08/30/2020   Tdap 03/31/2012, 04/25/2022   Zoster Recombinant(Shingrix) 01/18/2022    Screening Tests Health Maintenance  Topic Date Due   Colonoscopy  10/30/2024 (Originally 08/24/2000)   Zoster Vaccines- Shingrix (2 of 2) 10/30/2024 (Originally 03/15/2022)   COVID-19 Vaccine (1) 05/07/2025 (Originally 08/24/1961)   INFLUENZA VACCINE  05/15/2024   OPHTHALMOLOGY EXAM  07/03/2024   HEMOGLOBIN A1C  08/08/2024   FOOT EXAM  10/30/2024   Diabetic kidney evaluation - eGFR measurement  02/06/2025   Diabetic kidney evaluation - Urine ACR  02/06/2025   Medicare Annual Wellness (AWV)  04/21/2025   DTaP/Tdap/Td (3 - Td or Tdap) 04/25/2032   Pneumococcal Vaccine: 50+ Years   Completed   Hepatitis C Screening  Completed   Hepatitis B Vaccines  Aged Out   HPV VACCINES  Aged Out   Meningococcal B Vaccine  Aged Out    Health Maintenance  There are no preventive care reminders to display for this patient.  Health Maintenance Items Addressed: See Nurse Notes at the end of this note  Additional Screening:  Vision Screening: Recommended annual ophthalmology exams for early detection of glaucoma and other disorders of the eye. Would you like a referral to an eye doctor? No    Dental Screening: Recommended annual dental exams for proper oral hygiene  Community Resource Referral / Chronic Care Management: CRR required this visit?  No   CCM required this visit?  No   Plan:    I have personally reviewed and noted the following in the patient's chart:   Medical and social history Use of alcohol, tobacco or illicit drugs  Current medications and supplements including opioid prescriptions. Patient is not currently taking opioid prescriptions. Functional ability and status Nutritional status Physical activity Advanced directives List of other physicians Hospitalizations, surgeries, and ER visits in previous 12 months Vitals Screenings to include cognitive, depression, and falls Referrals and appointments  In addition, I have reviewed and discussed with patient certain preventive protocols, quality metrics, and best practice recommendations. A written personalized care plan for preventive services as well as general preventive health recommendations were provided to patient.   Ozie Ned, CMA   05/06/2024   After Visit Summary: (MyChart) Due to this being a telephonic visit, the after visit summary with patients personalized plan was offered to patient via MyChart   Notes: Nothing significant to report at this time.

## 2024-04-21 NOTE — Progress Notes (Unsigned)
 Subjective:   Ethan Peters is a 68 y.o. who presents for a Medicare Wellness preventive visit.  As a reminder, Annual Wellness Visits don't include a physical exam, and some assessments may be limited, especially if this visit is performed virtually. We may recommend an in-person follow-up visit with your provider if needed.  Visit Complete: Virtual I connected with  Ethan Peters on 04/21/24 by a audio enabled telemedicine application and verified that I am speaking with the correct person using two identifiers.  Patient Location: Home  Provider Location: Home Office  I discussed the limitations of evaluation and management by telemedicine. The patient expressed understanding and agreed to proceed.  Vital Signs: Because this visit was a virtual/telehealth visit, some criteria may be missing or patient reported. Any vitals not documented were not able to be obtained and vitals that have been documented are patient reported.  VideoDeclined- This patient declined Librarian, academic. Therefore the visit was completed with audio only.  Persons Participating in Visit: Patient assisted by pt's wife/Lucisa.  AWV Questionnaire: No: Patient Medicare AWV questionnaire was not completed prior to this visit.        Objective:    There were no vitals filed for this visit. There is no height or weight on file to calculate BMI.     04/21/2024   11:37 AM 01/29/2023    2:59 PM 01/22/2022    4:43 PM 01/15/2022    1:27 PM 11/23/2020    1:24 PM 11/20/2019    1:58 PM 11/18/2018    5:23 PM  Advanced Directives  Does Patient Have a Medical Advance Directive? No No No No No No   Would patient like information on creating a medical advance directive?  No - Patient declined No - Patient declined No - Patient declined No - Patient declined No - Patient declined Yes (MAU/Ambulatory/Procedural Areas - Information given)      Data saved with a previous flowsheet row definition     Current Medications (verified) Outpatient Encounter Medications as of 04/21/2024  Medication Sig   allopurinol  (ZYLOPRIM ) 300 MG tablet Take 1 tablet (300 mg total) by mouth daily.   aspirin  EC 81 MG EC tablet Take 1 tablet (81 mg total) by mouth daily.   atorvastatin  (LIPITOR ) 80 MG tablet Take 1 tablet (80 mg total) by mouth daily.   B Complex-C (B-COMPLEX WITH VITAMIN C) tablet Take 1 tablet by mouth as needed (for vitamin deficiency).   brimonidine  (ALPHAGAN ) 0.2 % ophthalmic solution Place 1 drop into both eyes 3 (three) times daily. Give 90-day supply at a time   candesartan  (ATACAND ) 8 MG tablet TAKE 1 TABLET (8 MG TOTAL) BY MOUTH 2 (TWO) TIMES DAILY.   cholecalciferol  (VITAMIN D3) 25 MCG (1000 UNIT) tablet Take 1,000 Units by mouth daily.   diltiazem  (CARDIZEM ) 30 MG tablet Take 1 tablet (30 mg total) by mouth 3 (three) times daily as needed (When blood pressure greater than 150/90).   donepezil  (ARICEPT ) 10 MG tablet Take 1 tablet (10 mg total) by mouth at bedtime.   dorzolamide -timolol  (COSOPT ) 22.3-6.8 MG/ML ophthalmic solution 1 DROP BOTH EYES TWICE A DAY. SEPARATE BY AT LEAST 10 MINUTES FROM OTHER PRESSURE REDUCING EYE DROPS (Patient taking differently: Place 1 drop into both eyes 2 (two) times daily.)   ezetimibe  (ZETIA ) 10 MG tablet TAKE 1 TABLET BY MOUTH EVERY DAY   glipiZIDE  (GLUCOTROL  XL) 2.5 MG 24 hr tablet Take 1 tablet (2.5 mg total) by mouth 2 (  two) times daily.   glucose blood (ONETOUCH VERIO) test strip TEST BLOOD SUGAR 4 TIMES DAILY DX E13.42   ibuprofen  (ADVIL ) 800 MG tablet Take 1 tablet (800 mg total) by mouth 2 (two) times daily as needed.   insulin  aspart (NOVOLOG ) 100 UNIT/ML injection Inject 25-50 Units into the skin 3 (three) times daily before meals.   latanoprost  (XALATAN ) 0.005 % ophthalmic solution Place 1 drop into both eyes at bedtime.   levothyroxine  (SYNTHROID ) 50 MCG tablet Take 1 tablet (50 mcg total) by mouth daily.   metFORMIN  (GLUCOPHAGE ) 1000 MG  tablet Take 1 tablet (1,000 mg total) by mouth 2 (two) times daily with a meal.   nitroGLYCERIN  (NITROSTAT ) 0.4 MG SL tablet Place 1 tablet (0.4 mg total) under the tongue every 5 (five) minutes x 3 doses as needed for chest pain. (Patient not taking: Reported on 04/21/2024)   No facility-administered encounter medications on file as of 04/21/2024.    Allergies (verified) Levemir  [insulin  detemir], Lisinopril , and Penicillins   History: Past Medical History:  Diagnosis Date   CAD (coronary artery disease) 01/09/13   Inferior STEMI s/p DES-RCA   Cataract    Diabetes mellitus    Uncontrolled, Hgb A1C 14.8% on 03/14, started on insulin    Gout    Hyperlipidemia    Hypertension    Ischemic cardiomyopathy    EF 45-50%, grade 1 diastolic dysfunction, mildly dilated RV, RA at the upper limits of normal, mild TR, PA systolic pressure 32 mm mercury and hypokinesis to akinesis of the basal mid inferior myocardium   Myocardial infarction (HCC) 01/09/2013   Shortness of breath    Thyroid  disease    Past Surgical History:  Procedure Laterality Date   CARDIAC CATHETERIZATION  01/22/2013   Diffuse borderline residual CAD consistent with uncontrolled diabetes, medical management recommended   CORONARY ANGIOPLASTY WITH STENT PLACEMENT  01/09/2013   30% pLAD, 30% mLAD, 70% pLCx, RCA occlusion at crux with R->L distal collaterals s/p DES; LVEF 50%, moderate-severe HK of inferior basal wall   EYE SURGERY Bilateral    LEFT HEART CATHETERIZATION WITH CORONARY ANGIOGRAM N/A 01/09/2013   Procedure: LEFT HEART CATHETERIZATION WITH CORONARY ANGIOGRAM;  Surgeon: Peter M Swaziland, MD;  Location: [facility] CATH LAB;  Service: Cardiovascular;  Laterality: N/A;   LEFT HEART CATHETERIZATION WITH CORONARY ANGIOGRAM N/A 01/22/2013   Procedure: LEFT HEART CATHETERIZATION WITH CORONARY ANGIOGRAM;  Surgeon: Ozell Fell, MD;  Location: [facility] CATH LAB;  Service: Cardiovascular;  Laterality: N/A;   Family History  Problem Relation Age  of Onset   Heart disease Mother    Heart disease Father    Heart disease Brother    Heart disease Brother    Colon cancer Neg Hx    Colon polyps Neg Hx    Social History   Socioeconomic History   Marital status: Married    Spouse name: Louetta Baker   Number of children: 1   Years of education: 16   Highest education level: Associate degree: occupational, Scientist, product/process development, or vocational program  Occupational History   Occupation: disabled    Comment: Chartered certified accountant   Tobacco Use   Smoking status: Never   Smokeless tobacco: Never  Vaping Use   Vaping status: Never Used  Substance and Sexual Activity   Alcohol use: Yes    Comment: 4 times per year-wine or beer   Drug use: No   Sexual activity: Yes    Birth control/protection: None  Other Topics Concern   Not on file  Social History  Narrative   Spanish-speaking. Limited English.    Social Drivers of Corporate investment banker Strain: Low Risk  (04/21/2024)   Overall Financial Resource Strain (CARDIA)    Difficulty of Paying Living Expenses: Not hard at all  Food Insecurity: No Food Insecurity (04/21/2024)   Hunger Vital Sign    Worried About Running Out of Food in the Last Year: Never true    Ran Out of Food in the Last Year: Never true  Transportation Needs: No Transportation Needs (04/21/2024)   PRAPARE - Administrator, Civil Service (Medical): No    Lack of Transportation (Non-Medical): No  Physical Activity: Insufficiently Active (04/21/2024)   Exercise Vital Sign    Days of Exercise per Week: 2 days    Minutes of Exercise per Session: 20 min  Stress: No Stress Concern Present (04/21/2024)   [employer] of Occupational Health - Occupational Stress Questionnaire    Feeling of Stress: Only a little  Social Connections: Moderately Isolated (04/21/2024)   Social Connection and Isolation Panel    Frequency of Communication with Friends and Family: More than three times a week    Frequency of Social Gatherings with  Friends and Family: More than three times a week    Attends Religious Services: Never    Database administrator or Organizations: No    Attends Banker Meetings: Never    Marital Status: Married    Tobacco Counseling Counseling given: Not Answered    Clinical Intake:              Lab Results  Component Value Date   HGBA1C 7.3 (H) 02/07/2024   HGBA1C 8.3 (H) 10/31/2023   HGBA1C 7.2 (H) 07/26/2023               Activities of Daily Living     04/21/2024   11:33 AM  In your present state of health, do you have any difficulty performing the following activities:  Hearing? 0  Vision? 1  Difficulty concentrating or making decisions? 0  Walking or climbing stairs? 0  Dressing or bathing? 0  Doing errands, shopping? 1  Comment pt's wife/family help  Preparing Food and eating ? Y  Comment pt's wife/family help  Using the Toilet? N  In the past six months, have you accidently leaked urine? Y  Do you have problems with loss of bowel control? N  Managing your Medications? Y  Comment pt's wife/family help  Managing your Finances? Y  Comment pt's wife/family help  Housekeeping or managing your Housekeeping? Y  Comment pt's wife/family help    Patient Care Team: Dettinger, Fonda LABOR, MD as PCP - General (Family Medicine) [place], Peter M, MD as PCP - Cardiology (Cardiology) Lenis Ethelle ORN, MD as Consulting Physician (Endocrinology) Godwin, Kristen Suzich, MD (Ophthalmology)  I have updated your Care Teams any recent Medical Services you may have received from other providers in the past year.     Assessment:   This is a routine wellness examination for Cylis.  Hearing/Vision screen No results found.   Goals Addressed   None    Depression Screen     04/21/2024   11:42 AM 02/07/2024    1:14 PM 10/31/2023   11:22 AM 07/26/2023    9:47 AM 04/24/2023    9:02 AM 03/18/2023    2:36 PM 01/30/2023    3:50 PM  PHQ 2/9 Scores  PHQ - 2 Score 4  0 2 2 0  0  PHQ- 9 Score 5  7 7  0  0  Exception Documentation      Patient refusal     Fall Risk     04/21/2024   11:28 AM 02/07/2024    1:13 PM 10/31/2023   11:22 AM 07/26/2023    9:46 AM 04/24/2023    9:02 AM  Fall Risk   Falls in the past year? 0 0 0 0 0  Number falls in past yr: 0 0     Injury with Fall? 0 0     Risk for fall due to : No Fall Risks Impaired vision;Impaired balance/gait     Follow up Falls evaluation completed Falls evaluation completed       MEDICARE RISK AT HOME:     TIMED UP AND GO:  Was the test performed?  {[phone]}  Cognitive Function: {CognitiveScreening:32337}    11/18/2018    5:23 PM  MMSE - Mini Mental State Exam  Orientation to time 5  Orientation to Place 5  Registration 3  Attention/ Calculation 5  Recall 1  Language- name 2 objects 2  Language- repeat 0  Language- follow 3 step command 3  Language- read & follow direction 1  Write a sentence 1  Copy design 1  Total score 27        04/21/2024   11:45 AM 01/29/2023    3:00 PM 11/23/2020    1:24 PM 11/20/2019    2:05 PM  6CIT Screen  What Year? 0 points 0 points 0 points 0 points  What month? 0 points 0 points 0 points 0 points  What time? 0 points 0 points 0 points 0 points  Count back from 20 0 points 0 points 0 points 0 points  Months in reverse 0 points 0 points 0 points 0 points  Repeat phrase 2 points 0 points 0 points 2 points  Total Score 2 points 0 points 0 points 2 points    Immunizations Immunization History  Administered Date(s) Administered   Fluad Quad(high Dose 65+) 08/30/2020, 07/26/2021, 07/26/2022   Fluad Trivalent(High Dose 65+) 07/26/2023   Influenza Inj Mdck Quad With Preservative 07/28/2019   Influenza,inj,Quad PF,6+ Mos 08/22/2015, 07/27/2016, 07/16/2017, 08/13/2018   PNEUMOCOCCAL CONJUGATE-20 08/03/2022, 07/26/2023   Pneumococcal Conjugate-13 11/27/2016   Pneumococcal Polysaccharide-23 09/25/2007, 08/13/2018, 09/30/2019, 08/30/2020   Tdap  03/31/2012, 04/25/2022   Zoster Recombinant(Shingrix) 01/18/2022    Screening Tests Health Maintenance  Topic Date Due   Colonoscopy  10/30/2024 (Originally 08/24/2000)   Zoster Vaccines- Shingrix (2 of 2) 10/30/2024 (Originally 03/15/2022)   COVID-19 Vaccine (1) 05/07/2025 (Originally 08/24/1961)   INFLUENZA VACCINE  05/15/2024   OPHTHALMOLOGY EXAM  07/03/2024   HEMOGLOBIN A1C  08/08/2024   FOOT EXAM  10/30/2024   Diabetic kidney evaluation - eGFR measurement  02/06/2025   Diabetic kidney evaluation - Urine ACR  02/06/2025   Medicare Annual Wellness (AWV)  04/21/2025   DTaP/Tdap/Td (3 - Td or Tdap) 04/25/2032   Pneumococcal Vaccine: 50+ Years  Completed   Hepatitis C Screening  Completed   Hepatitis B Vaccines  Aged Out   HPV VACCINES  Aged Out   Meningococcal B Vaccine  Aged Out    Health Maintenance  There are no preventive care reminders to display for this patient. Health Maintenance Items Addressed: {HMMCR (Optional):30011}  Additional Screening:  Vision Screening: Recommended annual ophthalmology exams for early detection of glaucoma and other disorders of the eye. Would you like a referral to an eye doctor? {YES/NO:21197}   Dental  Screening: Recommended annual dental exams for proper oral hygiene  Community Resource Referral / Chronic Care Management: CRR required this visit?  {YES/NO:21197}  CCM required this visit?  {CCM Required choices:[phone]}   Plan:    I have personally reviewed and noted the following in the patient's chart:   Medical and social history Use of alcohol, tobacco or illicit drugs  Current medications and supplements including opioid prescriptions. {Opioid Prescriptions:[phone]} Functional ability and status Nutritional status Physical activity Advanced directives List of other physicians Hospitalizations, surgeries, and ER visits in previous 12 months Vitals Screenings to include cognitive, depression, and falls Referrals  and appointments  In addition, I have reviewed and discussed with patient certain preventive protocols, quality metrics, and best practice recommendations. A written personalized care plan for preventive services as well as general preventive health recommendations were provided to patient.   Ozie Ned, CMA   04/21/2024   After Visit Summary: {CHL AMB AWV After Visit Summary:[phone]}  Notes: {Nurse Notes:32343}

## 2024-04-21 NOTE — Patient Instructions (Signed)
 [name] , Thank you for taking time out of your busy schedule to complete your Annual Wellness Visit with me. I enjoyed our conversation and look forward to speaking with you again next year. I, as well as your care team,  appreciate your ongoing commitment to your health goals. Please review the following plan we discussed and let me know if I can assist you in the future. Your Game plan/ To Do List     Follow up Visits: Next Medicare AWV with our clinical staff: 04/22/25 at 11:20a.m.   Have you seen your provider in the last 6 months (3 months if uncontrolled diabetes)? Yes Next Office Visit with your provider: n/a  Clinician Recommendations:  Aim for 30 minutes of exercise or brisk walking, 6-8 glasses of water, and 5 servings of fruits and vegetables each day.       This is a list of the screening recommended for you and due dates:  Health Maintenance  Topic Date Due   Medicare Annual Wellness Visit  01/29/2024   Colon Cancer Screening  10/30/2024*   Zoster (Shingles) Vaccine (2 of 2) 10/30/2024*   COVID-19 Vaccine (1) 05/07/2025*   Flu Shot  05/15/2024   Eye exam for diabetics  07/03/2024   Hemoglobin A1C  08/08/2024   Complete foot exam   10/30/2024   Yearly kidney function blood test for diabetes  02/06/2025   Yearly kidney health urinalysis for diabetes  02/06/2025   DTaP/Tdap/Td vaccine (3 - Td or Tdap) 04/25/2032   Pneumococcal Vaccine for age over 26  Completed   Hepatitis C Screening  Completed   Hepatitis B Vaccine  Aged Out   HPV Vaccine  Aged Out   Meningitis B Vaccine  Aged Out  *Topic was postponed. The date shown is not the original due date.    Advanced directives: (Declined) Advance directive discussed with you today. Even though you declined this today, please call our office should you change your mind, and we can give you the proper paperwork for you to fill out. Advance Care Planning is important because it:  [x]  Makes sure you receive the medical care  that is consistent with your values, goals, and preferences  [x]  It provides guidance to your family and loved ones and reduces their decisional burden about whether or not they are making the right decisions based on your wishes.  Follow the link provided in your after visit summary or read over the paperwork we have mailed to you to help you started getting your Advance Directives in place. If you need assistance in completing these, please reach out to us  so that we can help you!  See attachments for Preventive Care and Fall Prevention Tips.

## 2024-05-12 NOTE — Progress Notes (Signed)
 [name] Date of Birth: Jul 09, 1956 Medical Record #[id number]  History of Present Illness: Mr. Howdeshell is seen for followup of CAD. He is status post inferior STEMI on 01/09/2013. He had stenting of the RCA at the crux. He had persistent chest pain and dyspnea even after his infarct with atypical symptoms. He subsequently underwent repeat cardiac catheterization on April 10,2014 which showed excellent patency of the stent in the RCA. He does have a long 70% stenosis in the left circumflex,  treated medically. He had a Myovew study in September 2017 which showed an inferior scar without ischemia. EF was 48%.  He has a history of poorly controlled diabetes mellitus with retinopathy and neuropathy.   He was seen in the ED in August 2021with progressive left eye blindness. MRI done showed an incidental chronic SDH. Evaluated by Neurosurgery and no further therapy needed. Repeat CT in May 2024 showed stable SDH  He was admitted in April 2023. Had returned from a trip to [place]. Had abdominal pain and nausea. Found to have PNA, UTI and DKA. Also had AKI. Responded to antibiotics, insulin , and hydration. ARB stopped due to hyperkalemia. This was later resumed as outpatient.   On follow up today he is seen with his son. He denies any cardiac complaints. BP at home has been well controlled. Last A1c was 7.3.  lipids well controlled. No chest pain or dyspnea. He is exercising on elliptical machine and doing some weights.    Current Outpatient Medications on File Prior to Visit  Medication Sig Dispense Refill   allopurinol  (ZYLOPRIM ) 300 MG tablet Take 1 tablet (300 mg total) by mouth daily. 90 tablet 3   aspirin  EC 81 MG EC tablet Take 1 tablet (81 mg total) by mouth daily.     atorvastatin  (LIPITOR ) 80 MG tablet Take 1 tablet (80 mg total) by mouth daily. 90 tablet 3   B Complex-C (B-COMPLEX WITH VITAMIN C) tablet Take 1 tablet by mouth as needed (for vitamin deficiency).     brimonidine   (ALPHAGAN ) 0.2 % ophthalmic solution Place 1 drop into both eyes 3 (three) times daily. Give 90-day supply at a time 30 mL 3   candesartan  (ATACAND ) 8 MG tablet TAKE 1 TABLET (8 MG TOTAL) BY MOUTH 2 (TWO) TIMES DAILY. 180 tablet 3   cholecalciferol  (VITAMIN D3) 25 MCG (1000 UNIT) tablet Take 1,000 Units by mouth daily.     diltiazem  (CARDIZEM ) 30 MG tablet Take 1 tablet (30 mg total) by mouth 3 (three) times daily as needed (When blood pressure greater than 150/90). 270 tablet 1   donepezil  (ARICEPT ) 10 MG tablet Take 1 tablet (10 mg total) by mouth at bedtime. 90 tablet 3   dorzolamide -timolol  (COSOPT ) 22.3-6.8 MG/ML ophthalmic solution 1 DROP BOTH EYES TWICE A DAY. SEPARATE BY AT LEAST 10 MINUTES FROM OTHER PRESSURE REDUCING EYE DROPS (Patient taking differently: Place 1 drop into both eyes 2 (two) times daily.) 20 mL 5   ezetimibe  (ZETIA ) 10 MG tablet TAKE 1 TABLET BY MOUTH EVERY DAY 90 tablet 1   glipiZIDE  (GLUCOTROL  XL) 2.5 MG 24 hr tablet Take 1 tablet (2.5 mg total) by mouth 2 (two) times daily. 180 tablet 3   glucose blood (ONETOUCH VERIO) test strip TEST BLOOD SUGAR 4 TIMES DAILY DX E13.42 400 strip 3   ibuprofen  (ADVIL ) 800 MG tablet Take 1 tablet (800 mg total) by mouth 2 (two) times daily as needed. 180 tablet 3   insulin  aspart (NOVOLOG ) 100 UNIT/ML injection Inject  25-50 Units into the skin 3 (three) times daily before meals. 130 mL 3   latanoprost  (XALATAN ) 0.005 % ophthalmic solution Place 1 drop into both eyes at bedtime. 7.5 mL 3   levothyroxine  (SYNTHROID ) 50 MCG tablet Take 1 tablet (50 mcg total) by mouth daily. 90 tablet 3   metFORMIN  (GLUCOPHAGE ) 1000 MG tablet Take 1 tablet (1,000 mg total) by mouth 2 (two) times daily with a meal. 180 tablet 3   No current facility-administered medications on file prior to visit.    Allergies  Allergen Reactions   Levemir  [Insulin  Detemir] Swelling   Lisinopril  Cough   Penicillins Rash    Lip swelling    Past Medical History:   Diagnosis Date   CAD (coronary artery disease) 01/09/13   Inferior STEMI s/p DES-RCA   Cataract    Diabetes mellitus    Uncontrolled, Hgb A1C 14.8% on 03/14, started on insulin    Gout    Hyperlipidemia    Hypertension    Ischemic cardiomyopathy    EF 45-50%, grade 1 diastolic dysfunction, mildly dilated RV, RA at the upper limits of normal, mild TR, PA systolic pressure 32 mm mercury and hypokinesis to akinesis of the basal mid inferior myocardium   Myocardial infarction (HCC) 01/09/2013   Shortness of breath    Thyroid  disease     Past Surgical History:  Procedure Laterality Date   CARDIAC CATHETERIZATION  01/22/2013   Diffuse borderline residual CAD consistent with uncontrolled diabetes, medical management recommended   CORONARY ANGIOPLASTY WITH STENT PLACEMENT  01/09/2013   30% pLAD, 30% mLAD, 70% pLCx, RCA occlusion at crux with R->L distal collaterals s/p DES; LVEF 50%, moderate-severe HK of inferior basal wall   EYE SURGERY Bilateral    LEFT HEART CATHETERIZATION WITH CORONARY ANGIOGRAM N/A 01/09/2013   Procedure: LEFT HEART CATHETERIZATION WITH CORONARY ANGIOGRAM;  Surgeon: Rhonna Holster M [place], MD;  Location: [facility] CATH LAB;  Service: Cardiovascular;  Laterality: N/A;   LEFT HEART CATHETERIZATION WITH CORONARY ANGIOGRAM N/A 01/22/2013   Procedure: LEFT HEART CATHETERIZATION WITH CORONARY ANGIOGRAM;  Surgeon: Ozell Fell, MD;  Location: [facility] CATH LAB;  Service: Cardiovascular;  Laterality: N/A;    Social History   Tobacco Use  Smoking Status Never  Smokeless Tobacco Never    Social History   Substance and Sexual Activity  Alcohol Use Yes   Comment: 4 times per year-wine or beer    Family History  Problem Relation Age of Onset   Heart disease Mother    Heart disease Father    Heart disease Brother    Heart disease Brother    Colon cancer Neg Hx    Colon polyps Neg Hx     Review of Systems: As noted in history of present illness.  All other systems were reviewed and  are negative.  Physical Exam: BP (!) 144/82   Pulse 69   Ht 5' 6 (1.676 m)   Wt 208 lb 6.4 oz (94.5 kg)   SpO2 97%   BMI 33.64 kg/m  GENERAL:  Well appearing male, overweight  in NAD HEENT:  he is blind NECK:  No jugular venous distention, no bruits LUNGS:  Clear to auscultation bilaterally CHEST:  Unremarkable HEART:  RRR,  PMI not displaced or sustained,S1 and S2 within normal limits, no S3, no S4: no clicks, no rubs, no murmurs ABD:  Soft, nontender. BS +, no masses or bruits. No hepatomegaly, no splenomegaly EXT:  2 + pulses throughout, no edema, no cyanosis no clubbing SKIN:  Warm and dry.  No rashes NEURO:  Alert and oriented x 3. Cranial nerves II through XII intact. PSYCH:  Cognitively intact      LABORATORY DATA: Lab Results  Component Value Date   WBC 5.6 02/07/2024   HGB 12.2 (L) 02/07/2024   HCT 36.1 (L) 02/07/2024   PLT 167 02/07/2024   GLUCOSE 83 02/07/2024   CHOL 98 (L) 02/07/2024   TRIG 247 (H) 02/07/2024   HDL 33 (L) 02/07/2024   LDLCALC 27 02/07/2024   ALT 20 02/07/2024   AST 19 02/07/2024   NA 143 02/07/2024   K 4.2 02/07/2024   CL 104 02/07/2024   CREATININE 1.60 (H) 02/07/2024   BUN 25 02/07/2024   CO2 24 02/07/2024   TSH 4.070 02/07/2024   PSA 0.6 09/22/2014   INR 1.16 01/21/2013   HGBA1C 7.3 (H) 02/07/2024   MICROALBUR neg 11/02/2014       Myoview  06/26/16:Study Highlights     The left ventricular ejection fraction is mildly decreased (45-54%). Nuclear stress EF: 48%. Blood pressure demonstrated a hypertensive response to exercise. There was no ST segment deviation noted during stress. Findings consistent with prior myocardial infarction. This is an intermediate risk study.   Small inferobasal wall infarct no ischemia EF 48%    Myoview  11/03/20: Study Highlights    Nuclear stress EF: 53%. No wall motion abnormalities. The left ventricular ejection fraction is mildly decreased (45-54%). There was no ST segment deviation noted  during stress. Defect 1: There is a medium defect of moderate severity present in the basal inferior and mid inferior location. Findings consistent with prior myocardial infarction inferior wall. There is no significant ischemia identified. This is an intermediate risk study.   Oneil Parchment, MD  Assessment / Plan: 1. Coronary disease status post inferior STEMI 2014 treated with DES to the RCA. Repeat cardiac catheterization in 2014 demonstrated continued patency. Moderate diffuse disease in the left circumflex. Myoview  in January 2022 showed no ischemia.  He has class 1 angina. Continue medical therapy with ASA, statin  2. Diabetes mellitus: On metformin , glipizide , and insulin  . Followed by primary care. Last A1c 7.3%  3. Dyslipidemia. On high dose lipitor  and Zetia . Last LDL 27 is at goal < 55.  Encourage heart healthy diet.   4. Hypertension, continue Atacand  and PRN cardizem   5. Diabetic retinopathy and neuropathy. Now legally blind.   6. Chronic subdural Hematoma. Seen by Neurosurgery. stable   Follow up in 6 months.

## 2024-05-20 ENCOUNTER — Ambulatory Visit: Attending: Cardiology | Admitting: Cardiology

## 2024-05-20 ENCOUNTER — Encounter: Payer: Self-pay | Admitting: Cardiology

## 2024-05-20 VITALS — BP 144/82 | HR 69 | Ht 66.0 in | Wt 208.4 lb

## 2024-05-20 DIAGNOSIS — E782 Mixed hyperlipidemia: Secondary | ICD-10-CM

## 2024-05-20 DIAGNOSIS — I10 Essential (primary) hypertension: Secondary | ICD-10-CM | POA: Diagnosis not present

## 2024-05-20 DIAGNOSIS — I25118 Atherosclerotic heart disease of native coronary artery with other forms of angina pectoris: Secondary | ICD-10-CM | POA: Diagnosis not present

## 2024-05-20 MED ORDER — NITROGLYCERIN 0.4 MG SL SUBL: 0.4000 mg | SUBLINGUAL_TABLET | SUBLINGUAL | 3 refills | Status: AC | PRN

## 2024-05-20 NOTE — Patient Instructions (Signed)
 Medication Instructions:  Continue same medications *If you need a refill on your cardiac medications before your next appointment, please call your pharmacy*  Lab Work: None ordered  Testing/Procedures: None ordered  Follow-Up: At [facility], you and your health needs are our priority.  As part of our continuing mission to provide you with exceptional heart care, our providers are all part of one team.  This team includes your primary Cardiologist (physician) and Advanced Practice Providers or APPs (Physician Assistants and Nurse Practitioners) who all work together to provide you with the care you need, when you need it.  Your next appointment:  6 months   Call in Oct to schedule Feb appointment     Provider:  Dr.Jordan   We recommend signing up for the patient portal called MyChart.  Sign up information is provided on this After Visit Summary.  MyChart is used to connect with patients for Virtual Visits (Telemedicine).  Patients are able to view lab/test results, encounter notes, upcoming appointments, etc.  Non-urgent messages can be sent to your provider as well.   To learn more about what you can do with MyChart, go to [email or web address].

## 2024-06-01 ENCOUNTER — Ambulatory Visit: Admitting: Family Medicine

## 2024-06-01 ENCOUNTER — Encounter: Payer: Self-pay | Admitting: Family Medicine

## 2024-06-01 VITALS — BP 132/77 | HR 85 | Ht 66.0 in | Wt 210.0 lb

## 2024-06-01 DIAGNOSIS — E782 Mixed hyperlipidemia: Secondary | ICD-10-CM | POA: Diagnosis not present

## 2024-06-01 DIAGNOSIS — I10 Essential (primary) hypertension: Secondary | ICD-10-CM

## 2024-06-01 DIAGNOSIS — E1159 Type 2 diabetes mellitus with other circulatory complications: Secondary | ICD-10-CM

## 2024-06-01 DIAGNOSIS — E1342 Other specified diabetes mellitus with diabetic polyneuropathy: Secondary | ICD-10-CM

## 2024-06-01 DIAGNOSIS — Z7984 Long term (current) use of oral hypoglycemic drugs: Secondary | ICD-10-CM | POA: Diagnosis not present

## 2024-06-01 DIAGNOSIS — E11319 Type 2 diabetes mellitus with unspecified diabetic retinopathy without macular edema: Secondary | ICD-10-CM

## 2024-06-01 DIAGNOSIS — E039 Hypothyroidism, unspecified: Secondary | ICD-10-CM | POA: Diagnosis not present

## 2024-06-01 LAB — LIPID PANEL

## 2024-06-01 LAB — BAYER DCA HB A1C WAIVED: HB A1C (BAYER DCA - WAIVED): 7.2 % — ABNORMAL HIGH (ref 4.8–5.6)

## 2024-06-01 NOTE — Progress Notes (Signed)
 BP 132/77   Pulse 85   Ht 5' 6 (1.676 m)   Wt 210 lb (95.3 kg)   SpO2 95%   BMI 33.89 kg/m    Subjective:   Patient ID: Ethan Peters, male    DOB: [birth year], 68 y.o.   MRN: [id number]  HPI: Ethan Peters is a 68 y.o. male presenting on 06/01/2024 for Medical Management of Chronic Issues, Diabetes, and Hypertension   Discussed the use of AI scribe software for clinical note transcription with the patient, who gave verbal consent to proceed.  History of Present Illness   Ethan Peters is a 68 year old male with type two diabetes, hypertension, hyperlipidemia, and hypothyroidism who presents for a recheck of his conditions.  He has experienced variable blood sugar levels, with a recent low of 76 mg/dL this morning and a high of 326 mg/dL yesterday, attributed to missing his insulin  and oral medications. Typically, his blood sugar ranges from 115 to 180 mg/dL. He is currently taking metformin  and glipizide , both at 2.5 mg twice daily, and uses Novolog  insulin .  He has a history of type two diabetes, vascular disease, neuropathy, and retinopathy, and is blind in both eyes. He does not frequently forget his medications, indicating that the missed dose was an isolated incident.      Hypertension Patient is currently on candesartan , and their blood pressure today is 132/77. Patient denies any lightheadedness or dizziness. Patient denies headaches, blurred vision, chest pains, shortness of breath, or weakness. Denies any side effects from medication and is content with current medication.    Hypothyroidism recheck Patient is coming in for thyroid  recheck today as well. They deny any issues with hair changes or heat or cold problems or diarrhea or constipation. They deny any chest pain or palpitations. They are currently on no occasion because he stopped it a month ago  Hyperlipidemia Patient is coming in for recheck of his hyperlipidemia. The patient is currently taking  atorvastatin  and Zetia . They deny any issues with myalgias or history of liver damage from it. They deny any focal numbness or weakness or chest pain.   Relevant past medical, surgical, family and social history reviewed and updated as indicated. Interim medical history since our last visit reviewed. Allergies and medications reviewed and updated.  Review of Systems  Constitutional:  Negative for chills and fever.  Eyes:  Positive for visual disturbance.  Respiratory:  Negative for shortness of breath and wheezing.   Cardiovascular:  Negative for chest pain and leg swelling.  Musculoskeletal:  Negative for back pain and gait problem.  Skin:  Negative for rash.  Neurological:  Negative for dizziness and light-headedness.  Psychiatric/Behavioral:  Negative for dysphoric mood.   All other systems reviewed and are negative.   Per HPI unless specifically indicated above   Allergies as of 06/01/2024       Reactions   Levemir  [insulin  Detemir] Swelling   Lisinopril  Cough   Penicillins Rash   Lip swelling        Medication List        Accurate as of June 01, 2024 11:18 AM. If you have any questions, ask your nurse or doctor.          allopurinol  300 MG tablet Commonly known as: ZYLOPRIM  Take 1 tablet (300 mg total) by mouth daily.   aspirin  EC 81 MG tablet Take 1 tablet (81 mg total) by mouth daily.   atorvastatin  80 MG tablet Commonly known as: LIPITOR   Take 1 tablet (80 mg total) by mouth daily.   B-complex with vitamin C tablet Take 1 tablet by mouth as needed (for vitamin deficiency).   brimonidine  0.2 % ophthalmic solution Commonly known as: ALPHAGAN  Place 1 drop into both eyes 3 (three) times daily. Give 90-day supply at a time   candesartan  8 MG tablet Commonly known as: ATACAND  TAKE 1 TABLET (8 MG TOTAL) BY MOUTH 2 (TWO) TIMES DAILY.   cholecalciferol  25 MCG (1000 UNIT) tablet Commonly known as: VITAMIN D3 Take 1,000 Units by mouth daily.   diltiazem   30 MG tablet Commonly known as: CARDIZEM  Take 1 tablet (30 mg total) by mouth 3 (three) times daily as needed (When blood pressure greater than 150/90).   donepezil  10 MG tablet Commonly known as: ARICEPT  Take 1 tablet (10 mg total) by mouth at bedtime.   dorzolamide -timolol  2-0.5 % ophthalmic solution Commonly known as: COSOPT  1 DROP BOTH EYES TWICE A DAY. SEPARATE BY AT LEAST 10 MINUTES FROM OTHER PRESSURE REDUCING EYE DROPS What changed: See the new instructions.   ezetimibe  10 MG tablet Commonly known as: ZETIA  TAKE 1 TABLET BY MOUTH EVERY DAY   glipiZIDE  2.5 MG 24 hr tablet Commonly known as: GLUCOTROL  XL Take 1 tablet (2.5 mg total) by mouth 2 (two) times daily.   ibuprofen  800 MG tablet Commonly known as: ADVIL  Take 1 tablet (800 mg total) by mouth 2 (two) times daily as needed.   insulin  aspart 100 UNIT/ML injection Commonly known as: novoLOG  Inject 25-50 Units into the skin 3 (three) times daily before meals.   latanoprost  0.005 % ophthalmic solution Commonly known as: XALATAN  Place 1 drop into both eyes at bedtime.   levothyroxine  50 MCG tablet Commonly known as: SYNTHROID  Take 1 tablet (50 mcg total) by mouth daily.   metFORMIN  1000 MG tablet Commonly known as: GLUCOPHAGE  Take 1 tablet (1,000 mg total) by mouth 2 (two) times daily with a meal.   nitroGLYCERIN  0.4 MG SL tablet Commonly known as: NITROSTAT  Place 1 tablet (0.4 mg total) under the tongue every 5 (five) minutes x 3 doses as needed for chest pain.   OneTouch Verio test strip Generic drug: glucose blood TEST BLOOD SUGAR 4 TIMES DAILY DX E13.42         Objective:   BP 132/77   Pulse 85   Ht 5' 6 (1.676 m)   Wt 210 lb (95.3 kg)   SpO2 95%   BMI 33.89 kg/m   Wt Readings from Last 3 Encounters:  06/01/24 210 lb (95.3 kg)  05/20/24 208 lb 6.4 oz (94.5 kg)  04/21/24 207 lb (93.9 kg)    Physical Exam Vitals and nursing note reviewed.  Constitutional:      General: He is not in  acute distress.    Appearance: He is well-developed. He is not diaphoretic.  Neck:     Thyroid : No thyromegaly.  Cardiovascular:     Rate and Rhythm: Normal rate and regular rhythm.     Heart sounds: Normal heart sounds. No murmur heard. Pulmonary:     Effort: Pulmonary effort is normal. No respiratory distress.     Breath sounds: Normal breath sounds. No wheezing.  Musculoskeletal:        General: No swelling. Normal range of motion.     Cervical back: Neck supple.  Lymphadenopathy:     Cervical: No cervical adenopathy.  Skin:    General: Skin is warm and dry.     Findings: No rash.  Neurological:  Mental Status: He is alert and oriented to person, place, and time.     Coordination: Coordination normal.  Psychiatric:        Behavior: Behavior normal.                 Assessment & Plan:   Problem List Items Addressed This Visit       Cardiovascular and Mediastinum   DM type 2 causing vascular disease (HCC)   Relevant Orders   Bayer DCA Hb A1c Waived   CBC with Differential/Platelet   CMP14+EGFR   Lipid panel   Essential hypertension - Primary   Relevant Orders   Bayer DCA Hb A1c Waived   CBC with Differential/Platelet   CMP14+EGFR   Lipid panel     Endocrine   Diabetic neuropathy (HCC)   Hypothyroidism   Diabetic retinopathy of both eyes associated with type 2 diabetes mellitus (HCC)     Other   Mixed hyperlipidemia   Relevant Orders   Bayer DCA Hb A1c Waived   CBC with Differential/Platelet   CMP14+EGFR   Lipid panel        Type 2 diabetes mellitus with diabetic neuropathy, retinopathy, and vascular disease leading to blindness Blood glucose levels variable, recent high of 326 mg/dL due to missed doses. Complicated by neuropathy, retinopathy, and vascular disease leading to blindness. - Continue metformin  and glipizide  as prescribed.  Hypertension Blood pressure looks good, continue the candesartan , no changes  Hyperlipidemia Will check  lipid levels, continue his Zetia  and atorvastatin   Hypothyroidism He stopped taking his medicines, will see where the levels are and see if we need to restart it, he stopped about a month ago, he did not stop for side effects, he only stopped because he just felt it was not doing anything for him         Follow up plan: Return in about 3 months (around 09/01/2024), or if symptoms worsen or fail to improve, for Diabetes recheck.  Counseling provided for all of the vaccine components Orders Placed This Encounter  Procedures   Bayer DCA Hb A1c Waived   CBC with Differential/Platelet   CMP14+EGFR   Lipid panel    Fonda Levins, MD [facility] 06/01/2024, 11:18 AM

## 2024-06-02 LAB — CBC WITH DIFFERENTIAL/PLATELET
Basophils Absolute: 0.1 x10E3/uL (ref 0.0–0.2)
Basos: 1 %
EOS (ABSOLUTE): 0.1 x10E3/uL (ref 0.0–0.4)
Eos: 2 %
Hematocrit: 35.8 % — ABNORMAL LOW (ref 37.5–51.0)
Hemoglobin: 11.8 g/dL — ABNORMAL LOW (ref 13.0–17.7)
Immature Grans (Abs): 0 x10E3/uL (ref 0.0–0.1)
Immature Granulocytes: 0 %
Lymphocytes Absolute: 2.4 x10E3/uL (ref 0.7–3.1)
Lymphs: 37 %
MCH: 31.5 pg (ref 26.6–33.0)
MCHC: 33 g/dL (ref 31.5–35.7)
MCV: 96 fL (ref 79–97)
Monocytes Absolute: 0.5 x10E3/uL (ref 0.1–0.9)
Monocytes: 7 %
Neutrophils Absolute: 3.4 x10E3/uL (ref 1.4–7.0)
Neutrophils: 53 %
Platelets: 168 x10E3/uL (ref 150–450)
RBC: 3.75 x10E6/uL — ABNORMAL LOW (ref 4.14–5.80)
RDW: 13.2 % (ref 11.6–15.4)
WBC: 6.5 x10E3/uL (ref 3.4–10.8)

## 2024-06-02 LAB — CMP14+EGFR
ALT: 20 IU/L (ref 0–44)
AST: 23 IU/L (ref 0–40)
Albumin: 4.6 g/dL (ref 3.9–4.9)
Alkaline Phosphatase: 104 IU/L (ref 44–121)
BUN/Creatinine Ratio: 13 (ref 10–24)
BUN: 19 mg/dL (ref 8–27)
Bilirubin Total: 0.5 mg/dL (ref 0.0–1.2)
CO2: 21 mmol/L (ref 20–29)
Calcium: 9 mg/dL (ref 8.6–10.2)
Chloride: 106 mmol/L (ref 96–106)
Creatinine, Ser: 1.5 mg/dL — ABNORMAL HIGH (ref 0.76–1.27)
Globulin, Total: 2.4 g/dL (ref 1.5–4.5)
Glucose: 91 mg/dL (ref 70–99)
Potassium: 4.5 mmol/L (ref 3.5–5.2)
Sodium: 140 mmol/L (ref 134–144)
Total Protein: 7 g/dL (ref 6.0–8.5)
eGFR: 50 mL/min/1.73 — ABNORMAL LOW (ref >59)

## 2024-06-02 LAB — LIPID PANEL
Cholesterol, Total: 74 mg/dL — AB (ref 100–199)
HDL: 31 mg/dL — AB (ref >39)
LDL CALC COMMENT:: 2.4 ratio (ref 0.0–5.0)
LDL Chol Calc (NIH): 29 mg/dL (ref 0–99)
Triglycerides: 61 mg/dL (ref 0–149)
VLDL Cholesterol Cal: 14 mg/dL (ref 5–40)

## 2024-06-10 ENCOUNTER — Ambulatory Visit: Payer: Self-pay | Admitting: Family Medicine

## 2024-07-13 DIAGNOSIS — Z125 Encounter for screening for malignant neoplasm of prostate: Secondary | ICD-10-CM | POA: Diagnosis not present

## 2024-07-13 DIAGNOSIS — N5201 Erectile dysfunction due to arterial insufficiency: Secondary | ICD-10-CM | POA: Diagnosis not present

## 2024-07-20 ENCOUNTER — Other Ambulatory Visit: Payer: Self-pay | Admitting: Family Medicine

## 2024-08-08 ENCOUNTER — Other Ambulatory Visit: Payer: Self-pay | Admitting: Family Medicine

## 2024-08-08 DIAGNOSIS — M109 Gout, unspecified: Secondary | ICD-10-CM

## 2024-08-10 MED ORDER — IBUPROFEN 800 MG PO TABS: 800.0000 mg | ORAL_TABLET | Freq: Two times a day (BID) | ORAL | 0 refills | Status: AC | PRN

## 2024-08-10 NOTE — Telephone Encounter (Signed)
 Refill failed. resent

## 2024-08-10 NOTE — Addendum Note (Signed)
 Addended by: Azaylea Maves D on: 08/10/2024 10:40 AM   Modules accepted: Orders

## 2024-09-04 ENCOUNTER — Encounter: Payer: Self-pay | Admitting: Family Medicine

## 2024-09-04 ENCOUNTER — Ambulatory Visit: Payer: Self-pay | Admitting: Family Medicine

## 2024-09-04 VITALS — BP 139/74 | HR 69 | Temp 97.6°F | Ht 66.0 in | Wt 206.4 lb

## 2024-09-04 DIAGNOSIS — N183 Chronic kidney disease, stage 3 unspecified: Secondary | ICD-10-CM | POA: Diagnosis not present

## 2024-09-04 DIAGNOSIS — Z7984 Long term (current) use of oral hypoglycemic drugs: Secondary | ICD-10-CM

## 2024-09-04 DIAGNOSIS — E782 Mixed hyperlipidemia: Secondary | ICD-10-CM

## 2024-09-04 DIAGNOSIS — E1159 Type 2 diabetes mellitus with other circulatory complications: Secondary | ICD-10-CM | POA: Diagnosis not present

## 2024-09-04 DIAGNOSIS — I10 Essential (primary) hypertension: Secondary | ICD-10-CM

## 2024-09-04 DIAGNOSIS — Z23 Encounter for immunization: Secondary | ICD-10-CM

## 2024-09-04 DIAGNOSIS — E1122 Type 2 diabetes mellitus with diabetic chronic kidney disease: Secondary | ICD-10-CM

## 2024-09-04 DIAGNOSIS — R413 Other amnesia: Secondary | ICD-10-CM

## 2024-09-04 DIAGNOSIS — E1342 Other specified diabetes mellitus with diabetic polyneuropathy: Secondary | ICD-10-CM

## 2024-09-04 DIAGNOSIS — E11319 Type 2 diabetes mellitus with unspecified diabetic retinopathy without macular edema: Secondary | ICD-10-CM | POA: Diagnosis not present

## 2024-09-04 LAB — CBC WITH DIFFERENTIAL/PLATELET
Basophils Absolute: 0.1 x10E3/uL (ref 0.0–0.2)
Basos: 1 %
EOS (ABSOLUTE): 0.1 x10E3/uL (ref 0.0–0.4)
Eos: 2 %
Hematocrit: 38.8 % (ref 37.5–51.0)
Hemoglobin: 12.8 g/dL — ABNORMAL LOW (ref 13.0–17.7)
Immature Grans (Abs): 0 x10E3/uL (ref 0.0–0.1)
Immature Granulocytes: 0 %
Lymphocytes Absolute: 2.9 x10E3/uL (ref 0.7–3.1)
Lymphs: 47 %
MCH: 32 pg (ref 26.6–33.0)
MCHC: 33 g/dL (ref 31.5–35.7)
MCV: 97 fL (ref 79–97)
Monocytes Absolute: 0.5 x10E3/uL (ref 0.1–0.9)
Monocytes: 8 %
Neutrophils Absolute: 2.6 x10E3/uL (ref 1.4–7.0)
Neutrophils: 42 %
Platelets: 175 x10E3/uL (ref 150–450)
RBC: 4 x10E6/uL — ABNORMAL LOW (ref 4.14–5.80)
RDW: 14 % (ref 11.6–15.4)
WBC: 6.2 x10E3/uL (ref 3.4–10.8)

## 2024-09-04 LAB — CMP14+EGFR
ALT: 14 IU/L (ref 0–44)
AST: 15 IU/L (ref 0–40)
Albumin: 4.6 g/dL (ref 3.9–4.9)
Alkaline Phosphatase: 104 IU/L (ref 47–123)
BUN/Creatinine Ratio: 9 — ABNORMAL LOW (ref 10–24)
BUN: 14 mg/dL (ref 8–27)
Bilirubin Total: 0.5 mg/dL (ref 0.0–1.2)
CO2: 24 mmol/L (ref 20–29)
Calcium: 9.5 mg/dL (ref 8.6–10.2)
Chloride: 104 mmol/L (ref 96–106)
Creatinine, Ser: 1.5 mg/dL — ABNORMAL HIGH (ref 0.76–1.27)
Globulin, Total: 2.8 g/dL (ref 1.5–4.5)
Glucose: 143 mg/dL — ABNORMAL HIGH (ref 70–99)
Potassium: 4.6 mmol/L (ref 3.5–5.2)
Sodium: 141 mmol/L (ref 134–144)
Total Protein: 7.4 g/dL (ref 6.0–8.5)
eGFR: 50 mL/min/1.73 — ABNORMAL LOW (ref >59)

## 2024-09-04 LAB — BAYER DCA HB A1C WAIVED: HB A1C (BAYER DCA - WAIVED): 7.2 % — ABNORMAL HIGH (ref 4.8–5.6)

## 2024-09-04 MED ORDER — DONEPEZIL HCL 10 MG PO TABS: 10.0000 mg | ORAL_TABLET | Freq: Every day | ORAL | 3 refills | Status: AC

## 2024-09-04 MED ORDER — ALLOPURINOL 300 MG PO TABS: 300.0000 mg | ORAL_TABLET | Freq: Every day | ORAL | 3 refills | Status: AC

## 2024-09-04 MED ORDER — METFORMIN HCL 1000 MG PO TABS: 1000.0000 mg | ORAL_TABLET | Freq: Two times a day (BID) | ORAL | 3 refills | Status: AC

## 2024-09-04 MED ORDER — ATORVASTATIN CALCIUM 80 MG PO TABS: 80.0000 mg | ORAL_TABLET | Freq: Every day | ORAL | 3 refills | Status: AC

## 2024-09-04 MED ORDER — LEVOTHYROXINE SODIUM 50 MCG PO TABS: 50.0000 ug | ORAL_TABLET | Freq: Every day | ORAL | 3 refills | Status: AC

## 2024-09-04 MED ORDER — GLIPIZIDE ER 5 MG PO TB24: 5.0000 mg | ORAL_TABLET | Freq: Two times a day (BID) | ORAL | 3 refills | Status: AC

## 2024-09-04 NOTE — Addendum Note (Signed)
 Addended by: JODENE CORNERS B on: 09/04/2024 12:24 PM   Modules accepted: Orders

## 2024-09-04 NOTE — Progress Notes (Signed)
 BP 139/74   Pulse 69   Temp 97.6 F (36.4 C)   Ht 5' 6 (1.676 m)   Wt 206 lb 6.4 oz (93.6 kg)   SpO2 98%   BMI 33.31 kg/m    Subjective:   Patient ID: Ethan Peters, male    DOB: 04/02/1956, 68 y.o.   MRN: [id number]  HPI: Ethan Peters is a 68 y.o. male presenting on 09/04/2024 for Medical Management of Chronic Issues   Discussed the use of AI scribe software for clinical note transcription with the patient, who gave verbal consent to proceed.  History of Present Illness   Ethan Peters is a 68 year old male with diabetes who presents for follow-up of his blood sugar control.  Glycemic control - Blood glucose levels have been fluctuating, with a maximum of 220 mg/dL and a minimum of 59 mg/dL. - Lowest reading of 59 mg/dL occurred yesterday due to not eating; such hypoglycemic episodes are uncommon for him. - Most recent hemoglobin A1c is 7.2%, consistent with previous results. - Currently taking glipizide , previously reduced to 2.5 mg.  Hyperlipidemia management - Currently taking atorvastatin  for cholesterol management.  Thyroid  function - Continues to take thyroid  medication. - Thyroid  function will be checked today.  Immunization status - Recently received pneumonia vaccine.          Relevant past medical, surgical, family and social history reviewed and updated as indicated. Interim medical history since our last visit reviewed. Allergies and medications reviewed and updated.  Review of Systems  Constitutional:  Negative for chills and fever.  Eyes:  Positive for visual disturbance.  Respiratory:  Negative for shortness of breath and wheezing.   Cardiovascular:  Negative for chest pain and leg swelling.  Musculoskeletal:  Negative for back pain and gait problem.  Skin:  Negative for rash.  Neurological:  Negative for dizziness, weakness and light-headedness.  All other systems reviewed and are negative.   Per HPI unless specifically indicated  above   Allergies as of 09/04/2024       Reactions   Levemir  [insulin  Detemir] Swelling   Lisinopril  Cough   Penicillins Rash   Lip swelling        Medication List        Accurate as of September 04, 2024 11:33 AM. If you have any questions, ask your nurse or doctor.          allopurinol  300 MG tablet Commonly known as: ZYLOPRIM  Take 1 tablet (300 mg total) by mouth daily.   aspirin  EC 81 MG tablet Take 1 tablet (81 mg total) by mouth daily.   atorvastatin  80 MG tablet Commonly known as: LIPITOR  Take 1 tablet (80 mg total) by mouth daily.   B-complex with vitamin C tablet Take 1 tablet by mouth as needed (for vitamin deficiency).   brimonidine  0.2 % ophthalmic solution Commonly known as: ALPHAGAN  Place 1 drop into both eyes 3 (three) times daily. Give 90-day supply at a time   candesartan  8 MG tablet Commonly known as: ATACAND  TAKE 1 TABLET (8 MG TOTAL) BY MOUTH 2 (TWO) TIMES DAILY.   cholecalciferol  25 MCG (1000 UNIT) tablet Commonly known as: VITAMIN D3 Take 1,000 Units by mouth daily.   diltiazem  30 MG tablet Commonly known as: CARDIZEM  Take 1 tablet (30 mg total) by mouth 3 (three) times daily as needed (When blood pressure greater than 150/90).   donepezil  10 MG tablet Commonly known as: ARICEPT  Take 1 tablet (10 mg total)  by mouth at bedtime.   dorzolamide -timolol  2-0.5 % ophthalmic solution Commonly known as: COSOPT  1 DROP BOTH EYES TWICE A DAY. SEPARATE BY AT LEAST 10 MINUTES FROM OTHER PRESSURE REDUCING EYE DROPS What changed: See the new instructions.   ezetimibe  10 MG tablet Commonly known as: ZETIA  TAKE 1 TABLET BY MOUTH EVERY DAY   glipiZIDE  5 MG 24 hr tablet Commonly known as: GLUCOTROL  XL Take 1 tablet (5 mg total) by mouth 2 (two) times daily. What changed:  medication strength how much to take Changed by: Fonda LABOR Marvin Maenza   ibuprofen  800 MG tablet Commonly known as: ADVIL  Take 1 tablet (800 mg total) by mouth 2 (two) times  daily as needed.   insulin  aspart 100 UNIT/ML injection Commonly known as: novoLOG  Inject 25-50 Units into the skin 3 (three) times daily before meals.   latanoprost  0.005 % ophthalmic solution Commonly known as: XALATAN  Place 1 drop into both eyes at bedtime.   levothyroxine  50 MCG tablet Commonly known as: SYNTHROID  Take 1 tablet (50 mcg total) by mouth daily.   metFORMIN  1000 MG tablet Commonly known as: GLUCOPHAGE  Take 1 tablet (1,000 mg total) by mouth 2 (two) times daily with a meal. What changed: See the new instructions. Changed by: Fonda LABOR Faylynn Stamos   nitroGLYCERIN  0.4 MG SL tablet Commonly known as: NITROSTAT  Place 1 tablet (0.4 mg total) under the tongue every 5 (five) minutes x 3 doses as needed for chest pain.   OneTouch Verio test strip Generic drug: glucose blood TEST BLOOD SUGAR 4 TIMES DAILY DX E13.42         Objective:   BP 139/74   Pulse 69   Temp 97.6 F (36.4 C)   Ht 5' 6 (1.676 m)   Wt 206 lb 6.4 oz (93.6 kg)   SpO2 98%   BMI 33.31 kg/m   Wt Readings from Last 3 Encounters:  09/04/24 206 lb 6.4 oz (93.6 kg)  06/01/24 210 lb (95.3 kg)  05/20/24 208 lb 6.4 oz (94.5 kg)    Physical Exam Vitals and nursing note reviewed.  Constitutional:      General: He is not in acute distress.    Appearance: He is well-developed. He is not diaphoretic.  Eyes:     General: No scleral icterus.    Conjunctiva/sclera: Conjunctivae normal.  Neck:     Thyroid : No thyromegaly.  Cardiovascular:     Rate and Rhythm: Normal rate and regular rhythm.     Heart sounds: Normal heart sounds. No murmur heard. Pulmonary:     Effort: Pulmonary effort is normal. No respiratory distress.     Breath sounds: Normal breath sounds. No wheezing.  Musculoskeletal:        General: No swelling. Normal range of motion.     Cervical back: Neck supple.  Lymphadenopathy:     Cervical: No cervical adenopathy.  Skin:    General: Skin is warm and dry.     Findings: No rash.   Neurological:     Mental Status: He is alert and oriented to person, place, and time.     Coordination: Coordination normal.  Psychiatric:        Behavior: Behavior normal.    Physical Exam   VITALS: BP- 139/74         Assessment & Plan:   Problem List Items Addressed This Visit       Cardiovascular and Mediastinum   DM type 2 causing vascular disease (HCC)   Relevant Medications  atorvastatin  (LIPITOR ) 80 MG tablet   glipiZIDE  (GLUCOTROL  XL) 5 MG 24 hr tablet   metFORMIN  (GLUCOPHAGE ) 1000 MG tablet   Other Relevant Orders   Bayer DCA Hb A1c Waived   Essential hypertension - Primary   Relevant Medications   atorvastatin  (LIPITOR ) 80 MG tablet   Other Relevant Orders   CBC with Differential/Platelet   CMP14+EGFR     Endocrine   Diabetic neuropathy (HCC)   Relevant Medications   atorvastatin  (LIPITOR ) 80 MG tablet   glipiZIDE  (GLUCOTROL  XL) 5 MG 24 hr tablet   metFORMIN  (GLUCOPHAGE ) 1000 MG tablet   Diabetic retinopathy of both eyes associated with type 2 diabetes mellitus (HCC)   Relevant Medications   atorvastatin  (LIPITOR ) 80 MG tablet   glipiZIDE  (GLUCOTROL  XL) 5 MG 24 hr tablet   metFORMIN  (GLUCOPHAGE ) 1000 MG tablet   CKD stage 3 due to type 2 diabetes mellitus (HCC)   Relevant Medications   atorvastatin  (LIPITOR ) 80 MG tablet   glipiZIDE  (GLUCOTROL  XL) 5 MG 24 hr tablet   metFORMIN  (GLUCOPHAGE ) 1000 MG tablet     Other   Mixed hyperlipidemia   Relevant Medications   atorvastatin  (LIPITOR ) 80 MG tablet   Other Visit Diagnoses       Memory changes       Relevant Medications   donepezil  (ARICEPT ) 10 MG tablet          Type 2 diabetes mellitus A1c at 7.2%, above target. Blood glucose fluctuates due to inconsistent eating. Current glipizide  dose is 2.5 mg. - Increased glipizide  to 5 mg twice daily. - Instructed to monitor blood glucose closely for hypoglycemia. - Advised to report frequent blood glucose drops below 75 mg/dL.  Essential  hypertension Blood pressure controlled at 139/74 mmHg.  Mixed hyperlipidemia Continues atorvastatin  for cholesterol management. - Checked cholesterol levels with blood work. - Sent atorvastatin  prescription to pharmacy.          Follow up plan: Return in about 3 months (around 12/05/2024), or if symptoms worsen or fail to improve, for Diabetes recheck.  Counseling provided for all of the vaccine components Orders Placed This Encounter  Procedures   Bayer DCA Hb A1c Waived   CBC with Differential/Platelet   CMP14+EGFR    Fonda Levins, MD [facility] 09/04/2024, 11:33 AM

## 2024-09-08 ENCOUNTER — Other Ambulatory Visit: Payer: Self-pay | Admitting: Cardiology

## 2024-09-08 ENCOUNTER — Other Ambulatory Visit: Payer: Self-pay | Admitting: Family Medicine

## 2024-09-09 ENCOUNTER — Ambulatory Visit: Payer: Self-pay | Admitting: Family Medicine

## 2024-11-23 ENCOUNTER — Ambulatory Visit: Admitting: Cardiology

## 2024-12-07 ENCOUNTER — Ambulatory Visit: Admitting: Family Medicine

## 2024-12-18 ENCOUNTER — Ambulatory Visit: Admitting: Cardiology

## 2025-04-22 ENCOUNTER — Ambulatory Visit: Payer: Self-pay
